# Patient Record
Sex: Female | Born: 1945 | Race: White | Hispanic: No | Smoking: Current every day smoker
Health system: Southern US, Community
[De-identification: ages and names within clinical notes are randomized; demographics above are authoritative.]

## PROBLEM LIST (undated history)

## (undated) DIAGNOSIS — R0600 Dyspnea, unspecified: Secondary | ICD-10-CM

## (undated) DIAGNOSIS — Z803 Family history of malignant neoplasm of breast: Secondary | ICD-10-CM

## (undated) DIAGNOSIS — K219 Gastro-esophageal reflux disease without esophagitis: Secondary | ICD-10-CM

## (undated) DIAGNOSIS — F439 Reaction to severe stress, unspecified: Secondary | ICD-10-CM

## (undated) DIAGNOSIS — R001 Bradycardia, unspecified: Secondary | ICD-10-CM

## (undated) DIAGNOSIS — G629 Polyneuropathy, unspecified: Secondary | ICD-10-CM

## (undated) DIAGNOSIS — M1990 Unspecified osteoarthritis, unspecified site: Secondary | ICD-10-CM

## (undated) DIAGNOSIS — I480 Paroxysmal atrial fibrillation: Secondary | ICD-10-CM

## (undated) DIAGNOSIS — R519 Headache, unspecified: Secondary | ICD-10-CM

## (undated) DIAGNOSIS — I10 Essential (primary) hypertension: Secondary | ICD-10-CM

## (undated) DIAGNOSIS — Z8041 Family history of malignant neoplasm of ovary: Secondary | ICD-10-CM

## (undated) DIAGNOSIS — F329 Major depressive disorder, single episode, unspecified: Secondary | ICD-10-CM

## (undated) DIAGNOSIS — F32A Depression, unspecified: Secondary | ICD-10-CM

## (undated) DIAGNOSIS — E119 Type 2 diabetes mellitus without complications: Secondary | ICD-10-CM

## (undated) DIAGNOSIS — Z8489 Family history of other specified conditions: Secondary | ICD-10-CM

## (undated) DIAGNOSIS — R51 Headache: Secondary | ICD-10-CM

## (undated) HISTORY — DX: Reaction to severe stress, unspecified: F43.9

## (undated) HISTORY — DX: Bradycardia, unspecified: R00.1

## (undated) HISTORY — DX: Essential (primary) hypertension: I10

## (undated) HISTORY — DX: Type 2 diabetes mellitus without complications: E11.9

## (undated) HISTORY — DX: Paroxysmal atrial fibrillation: I48.0

## (undated) HISTORY — DX: Family history of malignant neoplasm of ovary: Z80.41

## (undated) HISTORY — DX: Family history of malignant neoplasm of breast: Z80.3

## (undated) HISTORY — PX: CARDIAC CATHETERIZATION: SHX172

## (undated) HISTORY — PX: KNEE ARTHROSCOPY: SHX127

## (undated) HISTORY — PX: COLONOSCOPY: SHX174

## (undated) HISTORY — PX: CARPAL TUNNEL RELEASE: SHX101

---

## 1978-09-15 HISTORY — PX: APPENDECTOMY: SHX54

## 1978-09-15 HISTORY — PX: ABDOMINAL HYSTERECTOMY: SHX81

## 1998-06-05 ENCOUNTER — Encounter: Payer: Self-pay | Admitting: Cardiology

## 1998-06-05 ENCOUNTER — Emergency Department (HOSPITAL_COMMUNITY): Admission: EM | Admit: 1998-06-05 | Discharge: 1998-06-05 | Payer: Self-pay | Admitting: Emergency Medicine

## 2000-05-05 ENCOUNTER — Other Ambulatory Visit: Admission: RE | Admit: 2000-05-05 | Discharge: 2000-05-05 | Payer: Self-pay | Admitting: Obstetrics and Gynecology

## 2000-05-05 ENCOUNTER — Encounter: Payer: Self-pay | Admitting: Obstetrics and Gynecology

## 2000-05-05 ENCOUNTER — Encounter: Admission: RE | Admit: 2000-05-05 | Discharge: 2000-05-05 | Payer: Self-pay | Admitting: Obstetrics and Gynecology

## 2000-05-08 ENCOUNTER — Encounter: Admission: RE | Admit: 2000-05-08 | Discharge: 2000-05-08 | Payer: Self-pay | Admitting: Obstetrics and Gynecology

## 2000-05-08 ENCOUNTER — Encounter: Payer: Self-pay | Admitting: Obstetrics and Gynecology

## 2000-06-14 ENCOUNTER — Emergency Department (HOSPITAL_COMMUNITY): Admission: EM | Admit: 2000-06-14 | Discharge: 2000-06-14 | Payer: Self-pay | Admitting: Emergency Medicine

## 2000-12-04 ENCOUNTER — Ambulatory Visit (HOSPITAL_COMMUNITY): Admission: RE | Admit: 2000-12-04 | Discharge: 2000-12-04 | Payer: Self-pay | Admitting: Orthopaedic Surgery

## 2000-12-18 ENCOUNTER — Encounter: Admission: RE | Admit: 2000-12-18 | Discharge: 2000-12-18 | Payer: Self-pay | Admitting: Orthopaedic Surgery

## 2001-01-01 ENCOUNTER — Encounter: Admission: RE | Admit: 2001-01-01 | Discharge: 2001-01-01 | Payer: Self-pay | Admitting: Orthopaedic Surgery

## 2001-04-23 ENCOUNTER — Ambulatory Visit (HOSPITAL_COMMUNITY): Admission: RE | Admit: 2001-04-23 | Discharge: 2001-04-23 | Payer: Self-pay | Admitting: Cardiology

## 2001-05-25 ENCOUNTER — Encounter: Payer: Self-pay | Admitting: Obstetrics and Gynecology

## 2001-05-25 ENCOUNTER — Other Ambulatory Visit: Admission: RE | Admit: 2001-05-25 | Discharge: 2001-05-25 | Payer: Self-pay | Admitting: Obstetrics and Gynecology

## 2001-05-25 ENCOUNTER — Encounter: Admission: RE | Admit: 2001-05-25 | Discharge: 2001-05-25 | Payer: Self-pay | Admitting: Obstetrics and Gynecology

## 2001-11-09 ENCOUNTER — Emergency Department (HOSPITAL_COMMUNITY): Admission: EM | Admit: 2001-11-09 | Discharge: 2001-11-09 | Payer: Self-pay | Admitting: Emergency Medicine

## 2001-11-09 ENCOUNTER — Encounter: Payer: Self-pay | Admitting: Emergency Medicine

## 2002-05-26 ENCOUNTER — Encounter: Admission: RE | Admit: 2002-05-26 | Discharge: 2002-05-26 | Payer: Self-pay | Admitting: Obstetrics and Gynecology

## 2002-05-26 ENCOUNTER — Encounter: Payer: Self-pay | Admitting: Obstetrics and Gynecology

## 2002-05-30 ENCOUNTER — Other Ambulatory Visit: Admission: RE | Admit: 2002-05-30 | Discharge: 2002-05-30 | Payer: Self-pay | Admitting: Obstetrics and Gynecology

## 2002-09-15 HISTORY — PX: BACK SURGERY: SHX140

## 2002-10-12 ENCOUNTER — Ambulatory Visit (HOSPITAL_COMMUNITY): Admission: RE | Admit: 2002-10-12 | Discharge: 2002-10-13 | Payer: Self-pay | Admitting: Orthopaedic Surgery

## 2002-12-04 ENCOUNTER — Ambulatory Visit (HOSPITAL_COMMUNITY): Admission: RE | Admit: 2002-12-04 | Discharge: 2002-12-04 | Payer: Self-pay | Admitting: Orthopaedic Surgery

## 2005-09-15 HISTORY — PX: JOINT REPLACEMENT: SHX530

## 2006-04-15 ENCOUNTER — Inpatient Hospital Stay (HOSPITAL_COMMUNITY): Admission: RE | Admit: 2006-04-15 | Discharge: 2006-04-20 | Payer: Self-pay | Admitting: Orthopaedic Surgery

## 2006-04-16 ENCOUNTER — Ambulatory Visit: Payer: Self-pay | Admitting: Physical Medicine & Rehabilitation

## 2006-07-06 ENCOUNTER — Inpatient Hospital Stay (HOSPITAL_COMMUNITY): Admission: RE | Admit: 2006-07-06 | Discharge: 2006-07-11 | Payer: Self-pay | Admitting: Orthopaedic Surgery

## 2006-07-10 ENCOUNTER — Encounter: Payer: Self-pay | Admitting: Vascular Surgery

## 2006-07-23 ENCOUNTER — Encounter: Payer: Self-pay | Admitting: Vascular Surgery

## 2006-07-23 ENCOUNTER — Ambulatory Visit (HOSPITAL_COMMUNITY): Admission: RE | Admit: 2006-07-23 | Discharge: 2006-07-23 | Payer: Self-pay | Admitting: Orthopaedic Surgery

## 2007-01-27 ENCOUNTER — Encounter: Admission: RE | Admit: 2007-01-27 | Discharge: 2007-01-27 | Payer: Self-pay | Admitting: Obstetrics and Gynecology

## 2007-04-02 ENCOUNTER — Ambulatory Visit: Payer: Self-pay | Admitting: Gastroenterology

## 2007-04-12 ENCOUNTER — Ambulatory Visit: Payer: Self-pay | Admitting: Gastroenterology

## 2007-09-17 ENCOUNTER — Emergency Department (HOSPITAL_COMMUNITY): Admission: EM | Admit: 2007-09-17 | Discharge: 2007-09-17 | Payer: Self-pay | Admitting: Emergency Medicine

## 2010-06-03 ENCOUNTER — Ambulatory Visit: Payer: Self-pay | Admitting: Cardiology

## 2010-06-24 ENCOUNTER — Ambulatory Visit: Payer: Self-pay | Admitting: Cardiology

## 2010-07-03 ENCOUNTER — Ambulatory Visit: Payer: Self-pay | Admitting: Cardiovascular Disease

## 2011-01-01 ENCOUNTER — Encounter: Payer: Self-pay | Admitting: Cardiology

## 2011-01-03 ENCOUNTER — Ambulatory Visit: Payer: Self-pay | Admitting: Cardiology

## 2011-01-31 NOTE — Discharge Summary (Signed)
NAMECATHERYNE, Melanie [name]   MEDICAL RECORD NO.:  [id number]          PATIENT TYPE:  INP   LOCATION:  5019                         FACILITY:  [facility]   PHYSICIAN:  Mark C. Ophelia Charter, M.D.    DATE OF BIRTH:  August 06, 1946   DATE OF ADMISSION:  07/06/2006  DATE OF DISCHARGE:  07/11/2006                               DISCHARGE SUMMARY   FINAL DIAGNOSIS:  Right knee osteoarthritis.   PROCEDURE:  Right total knee arthroplasty.   ADDITIONAL DIAGNOSIS:  Hypertension, coronary artery disease, tobacco  use, hypercholesterolemia, history of atrial fibrillation.   This 65 year old female with progressive bone-on-bone changes in the  right knee, doing well past after her left total knee arthroplasty, July  of 2007.  She has failed anti-inflammatories and injection, and has bone-  on-bone changes.  She underwent computer-assisted right cemented total  knee arthroplasty on July 06, 2006.  The PFC Sigma Cruciate  subsetting component was used with a 10-mm insert.  The patient  tolerated the procedure well postop.  Seen by pharmacy for Coumadin, DVT  prophylaxis.  PT/OT consult.  Weightbearing as tolerated.   Hemoglobin was 7.2 postop.  She complains of significant pain.  She was  ambulatory postop day two.  Hemoglobin remained stable in the 10 to 10.2  range.  INR initially increased to __________  then dropped to 1.7 to  1.8.  She had no thigh pain.  Doppler test was checked due to the  patient's thigh pain.  She was concerned she might have DVT.  Doppler  test was negative.  She was controlled with p.o. pain medication.  X-ray  showed excellent position of the prosthesis.   PLAN:  She was discharged postop home with physical therapy; office  followup 2 weeks postop.  Prescription for Tylox given.      Mark C. Ophelia Charter, M.D.  Electronically Signed     MCY/MEDQ  D:  08/19/2006  T:  08/20/2006  Job:  [id number]

## 2011-01-31 NOTE — Op Note (Signed)
Melanie [name], Melanie [name]   MEDICAL RECORD NO.:  [id number]          PATIENT TYPE:  INP   LOCATION:  2899                         FACILITY:  [facility]   PHYSICIAN:  Mark C. Ophelia Charter, M.D.    DATE OF BIRTH:  Sep 24, 1945   DATE OF PROCEDURE:  04/15/2006  DATE OF DISCHARGE:                                 OPERATIVE REPORT   SURGEON:  Mark C. Ophelia Charter, M.D.   PREOPERATIVE DIAGNOSIS:  Left knee osteoarthritis.   POSTOPERATIVE DIAGNOSIS:  Left knee osteoarthritis.   PROCEDURE:  Left cemented total knee arthroplasty.   ANESTHESIA:  GOT plus preoperative femoral nerve block and postoperative 20  cc Marcaine.   DESCRIPTION OF PROCEDURE:  After standard prep and draping and preoperative  Ancef prophylaxis, 2 g, a proximal thigh tourniquet, lateral post and heel  bump were used.  Incision was made after standard Betadine, Vi-Drape, split  sheets and stockinette and Coban were applied.  Midline incision was made.  Resection of menisci, ACL and PCL, spurs off the femur.  Pins, 5 mm, were  placed in the femur in the incision and 2 in the tibia.  The femur was cut  first, based on 10-mm resection.  A computer was used for input of femoral  hip rotation, tibial model, femoral model.  After the femur was cut, sizing  showed a #3.  Chamfer cuts, notch cuts were made.  Next, the tibia was cut.  There was tricompartmental degenerative changes, bone-on-bone changes and  chronic synovitis.  In the suprapatellar pouch, thick synovium was excised.  There were no changes that would suggest chronic infection, however.  Spurs  were removed off the femur.  The #3 size was appropriate for the tibia.  Sequential keel preparation was used in the tibia, followed by trials with a  10-mm spacer with full extension.  Excellent flexion-extension balance and  perfect alignment on use of the computer.  After removal of trials,  pulsatile lavage, computer pins were removed.  Pulsatile lavage.   Cement was  mixed.  The tibia was cemented first, followed by the femur, placement of  the tibial 10-mm tray, and the patella had been prepared with the  oscillating saw from facet to facet at the beginning of the case, remaining  10 mm of bone, and the 35-mm all-poly dome placed.  The patient tolerated  the procedure well.  After the cement was hard, pressure was held and all  excessive cement had been removed.  The tourniquet was deflated.  Pulsatile  lavage was used again.  Capsule closed with #1 Tycron figure-of-eight,  interrupted, 2-0 Vicryl in the subcutaneous tissue, skin staples.  Instrument count/needle count was correct.      Mark C. Ophelia Charter, M.D.  Electronically Signed     MCY/MEDQ  D:  04/15/2006  T:  04/15/2006  Job:  [id number]

## 2011-01-31 NOTE — Op Note (Signed)
Melanie Melanie [name], Melanie [name]   MEDICAL RECORD NO.:  [id number]          PATIENT TYPE:  INP   LOCATION:  5019                         FACILITY:  [facility]   PHYSICIAN:  Mark C. Ophelia Charter, M.D.    DATE OF BIRTH:  11/20/1945   DATE OF PROCEDURE:  07/06/2006  DATE OF DISCHARGE:                                 OPERATIVE REPORT   PREOPERATIVE DIAGNOSIS:  Right knee osteoarthritis.   POSTOPERATIVE DIAGNOSIS:  Right knee osteoarthritis.   PROCEDURE:  Right cemented total knee arthroplasty, computer assisted.   SURGEON:  Mark C. Ophelia Charter, M.D.   ASSISTANT:  Wende Neighbors, P.A.-C.   ANESTHESIA:  GOT, plus Marcaine local.   COMPONENTS:  [employer] rotating platform PFC Sigma, #3 femur,  #3 tibia, 10-mm spacer, 35-mm patella.   ESTIMATED BLOOD LOSS:  Minimal.   TOURNIQUET TIME:  1 hour 15 minutes.   DESCRIPTION OF PROCEDURE:  After induction of general anesthesia, proximal  pneumatic tourniquet, Foley placed and preoperative Ancef prophylaxis.  Standard prepping and draping were performed.  Impervious stockinette and  Coban were used.  Arthroscopic sheets and drapes were applied.  Sterile skin  marker and Betadine and Vi-Drape were applied.  Incision was made at the  midline and medial parapatellar skin incision was made, splitting the quad  tendon.  Patella was cut initially from facet to facet with an oscillating  saw.  Standard resection of the menisci was performed, and the medial  femoral condyle was polished flattened.  The ACL and PCL were resected.  The  patella was flipped over, cut from facet to facet.  The distal femur was cut  first, and distal femoral pins and tibial pins were placed.  Bicortical pins  were used.  Distal femur initially cut, removing 9 mm.  As I was progressing  for the anterior cut, the saw bumped against the femoral pins, and  apparently, the array had been tightened down with the pliers.  It was in  between the teeth  and could not be tightened down securely.  Progression to  the tibia was performed.  The tibia was pinned and cut, and from this point,  the femoral array was placed back in the distal femoral jig; and, then,  using this, the femoral array attached to the pins was adjusted until  everything was straightened and tightened down securely.  Cutting continued.  The #3 was appropriate, based upon sizing with the computer.  The posterior  capsule was tight, and there were chronic synovitis changes with red,  erythematous synovium and thickening anteriorly, as well as posteriorly,  which looked like chronic synovitis, or possibly a previous fall with a  contusion anteriorly.  Scar tissue was resected.  There were only some areas  of grade 4 chondromalacia in the lateral compartment.  The box cut was made  using the computer with the epicondylar axis.  With trials in place, there  was still some tightness of the posterior capsule; it did not come quite  completely straight, and additional posterior capsule resected and stripping  posteriorly with 3/4 curved osteotome.  All the PCL was resected.  Keel cuts  were made in the tibia.  Pulsatile lavage.  The cement was mixed, and all 3  components were cemented in, tibia first, followed by femur, the 10-mm  spacer and patella.  There were full extension, excellent alignment.  Computer pins had been removed, and after pulsatile lavage, when the cement  was hard, the tourniquet was deflated.  Deep capsule was closed with Tycron,  2-0 Vicryl subcutaneous tissue, superficial retinaculum, subcuticular skin  closure.  Tincture of benzoin and Steri-Strips.  Postop dressing.  Instrument count, needle count were correct.  Knee immobilizer was applied,  and a postoperative dressing.      Mark C. Ophelia Charter, M.D.  Electronically Signed     MCY/MEDQ  D:  07/06/2006  T:  07/06/2006  Job:  [id number]

## 2011-01-31 NOTE — H&P (Signed)
[place]  Patient:    Melanie [name], Melanie [name]                      MRN: [id number] Adm. Date:  04/23/01 Attending:  Colleen Can. Deborah Chalk, M.D. Dictator:   Jennet Maduro. Earl Gala, R.N., A.N.P.                         History and Physical  CHIEF COMPLAINT:  Intermittent episodes of tachycardia.  HISTORY OF PRESENT ILLNESS:  Melanie [name] is a very pleasant 65 year old white female.  She has multiple medical problems.  She was seen at her regular six-month follow-up appointment towards the latter part of July.  At that point she noted that she had had three episodes of tachycardia but really attributed a lot of that to changes in her life as well as to her menstrual cycle.  She has ongoing tobacco abuse and is under a considerable amount of stress at home in caring for a husband who has a chronic pain syndrome due to cervical spine disease.  In the past she has had atrial fibrillation and has ongoing problems with hypertension and hypercholesterolemia.  We proceeded on with a stress Cardiolite study in which she demonstrated rather poor exercise tolerance.  There were marked EKG changes to suggest ischemia.  The Cardiolite images demonstrated lateral ischemia and she is now referred on for elective coronary angiography.  PAST MEDICAL HISTORY: 1. Hypercholesterolemia. 2. Hypertension. 3. Ongoing tobacco abuse. 4. Status post hysterectomy. 5. History of atrial fibrillation, paroxysmal. 6. Situational stress. 7. Obesity.  ALLERGIES:  None known.  CURRENT MEDICATIONS: 1. Ziac 5 mg a day. 2. Cardizem CD 120 mg a day. 3. Baby aspirin daily. 4. Vitamin E twice a day. 5. Tricor daily. 6. Multivitamin daily. 7. B6 daily.  FAMILY HISTORY:  Positive for coronary disease.  Her father died of a heart attack at the age of 20.  SOCIAL HISTORY:  She is married with two sons.  She has been smoking for the past 20-25 years.  There is no alcohol use.  She cares for  her disabled husband.  REVIEW OF SYSTEMS:  Otherwise as stated above.  She has had no frank chest pain, no shortness of breath.  No recent fever, flu cough-like syndrome.  No constipation, no diarrhea.  No edema.  PHYSICAL EXAMINATION:  GENERAL:  She is a pleasant, somewhat anxious female in no acute distress.  VITAL SIGNS:  Weight is 192 pounds.  Blood pressure is 150/80 sitting, 150/90 standing.  Heart rate is 64, respirations 18.  She is afebrile.  SKIN:  Warm and dry.  Color unremarkable.  NECK:  Supple.  LUNGS:  Clear.  HEART:  Shows a regular rhythm.  ABDOMEN:  Soft, positive bowel sounds, nontender.  EXTREMITIES:  Without edema.  NEUROLOGIC:  Intact.  LABORATORY DATA:  Currently pending.  OVERALL IMPRESSION:  Abnormal Cardiolite study suggesting lateral ischemia in the setting of multiple cardiovascular risk factors (ongoing tobacco abuse, hypercholesterolemia, hypertension, and positive family history).  PLAN:  Will proceed on with elective coronary angiography.  The risks, procedure, and benefits have been explained and she is willing to proceed. DD:  04/21/01 TD:  04/21/01 Job: [id number]

## 2011-01-31 NOTE — Op Note (Signed)
Melanie [name], Melanie [name]                       ACCOUNT NO.:  [id number]   MEDICAL RECORD NO.:  [id number]                   PATIENT TYPE:  OIB   LOCATION:  NA                                   FACILITY:  [facility]   PHYSICIAN:  Mark C. Ophelia Charter, M.D.                 DATE OF BIRTH:  [birth year]   DATE OF PROCEDURE:  DATE OF DISCHARGE:                                 OPERATIVE REPORT   PREOPERATIVE DIAGNOSIS:  Right L4-5 herniated nucleus pulposus.   POSTOPERATIVE DIAGNOSIS:  Right L4-5 herniated nucleus pulposus.   PROCEDURE:  Right L4-5 laminotomy and L4-5 microdiskectomy.   SURGEON:  Dr. Annell Greening.   ASSISTANT:  Dr. Odette Fraction.   ANESTHESIA:  GOT.   ESTIMATED BLOOD LOSS:  Less than 100 cc.   DESCRIPTION OF PROCEDURE:  After induction of general anesthesia, the  patient was placed on the Andrews frame with preoperative Ancef prophylaxis  and careful padding and positioning.  The back was prepped with Duraprep,  the area was squared with towels, Betadine applied and laminectomy sheet.  Needle localization with the needle just at the top portion of the disk at  L4-5 was obtained with cross table lateral plane radiograph.  Incision was  made in the midline.  Subperiosteal dissection on the right side was  performed and a self retaining retractor was placed.  The inferior aspect of  the lamina at L4 was identified, a laminotomy was performed with 3 mm  Kerrison rongeur and the hypertrophic thick ligament was removed, exposing  the dura.  Foraminotomy was performed, nerve root was evaluated and  visualized, and bone was removed after the level out to the level of the  pedicle.  Nerve root was gently retracted towards the midline with the  retractor, there was a bulging disk present on the far right side.  Annulus  was incised with a 15 scalpel blade and passes were made with the  micropituitaries, Epsteins and straight and down biting pituitary.  Epstein  was used to slip underneath  the ligament, out to the far lateral side and  deliver the disk material to the center portion of the disk where it was  grasped with straight pituitary and removed.  Straight pituitary was used  from the opposite side by Dr. Ollen Bowl to remove some additional disk  fragments out to the right side.  Hockey stick was used out far on the right  side, the disk was fat, there were no areas of compression and 180 degree  sweep anterior to the dura was performed with no areas of central  compression.  Foramina was re-inspected.  The axilla of the nerve root and  foramina was slightly enlarged, removing the small remaining pieces of bone.  After irrigation with saline solution, deep fascia was closed with 0 Vicryl,  2-0 Vicryl in subcutaneous tissue, skin staple closure, Marcaine  infiltration into the skin and postoperative  dressing.  The patient was  transferred to the supine position, extubated and then transferred to the  recovery room in stable condition.  Needle count was correct.                                               Mark C. Ophelia Charter, M.D.    MCY/MEDQ  D:  10/12/2002  T:  10/12/2002  Job:  [id number]

## 2011-01-31 NOTE — Discharge Summary (Signed)
NAMECATHERINE, Melanie [name]   MEDICAL RECORD NO.:  [id number]          PATIENT TYPE:  INP   LOCATION:  5021                         FACILITY:  [facility]   PHYSICIAN:  Mark C. Ophelia Charter, M.D.    DATE OF BIRTH:  1945/12/07   DATE OF ADMISSION:  04/15/2006  DATE OF DISCHARGE:  04/20/2006                                 DISCHARGE SUMMARY   FINAL DIAGNOSES:  Left osteoarthritis.   ADDITIONAL DIAGNOSES:  1. Tobacco use.  2. Type 2 diabetes.  3. Hypertension.   PROCEDURE:  Left cemented total knee arthroplasty, computer assisted.   This 65 year old female has been followed for many years with progressive  left knee pain, bone on bone, has decreased activities of daily living.  She  has been on antiinflammatories and cortisone injections without relief.  X-  rays demonstrate bone on bone changes, 10 degree flexion contracture.  She  is able to flex from 10 to 110 degrees and has __________compartment with  degenerative changes.   ADMISSION LABS:  Showed a normal CBC.  PT/PTT were normal.  Chemistry panel  was initially normal with a glucose of 85 and urinalysis was clear.   The patient was taken to the operating room after informed consent,  underwent cemented total knee arthroplasty with a PFC, diffuse sigma,  cruciate substituting rotating platform.  Thirty-five patella, 3 tibia, 3  femur were used with a 10 mm spacer.  Postoperatively, she had some elevated  glucose readings of 147, 138 and 172 for the 3 days after surgery.  She made  slow progress with physical therapy.  She was used morphine repetitively to  the point where she was falling asleep.  Hemoglobin was stable at 10.1.  She  was placed on a sliding scale insulin.  HepLock was removed on April 17, 2006.  Foley was removed.  She was started on Lantus 10 mg a day.  Diabetic  treatment protocol recommendations were changing to a CHS 60 gram modified  diet was followed.  She had problems with anxiety  and depression.  Zoloft  was started.  Referral was made to her internist for evaluation of her  hyperglycemia.  This was set up with Dr. Cena Benton, [facility], since she did not have a primary care physician.  Discharge  prescriptions were given for Zoloft and Tylox.  INR was 1.7.  She will be on  home Coumadin for 4 weeks.  Asked to followup in my office one week after  discharge.   CONDITION ON DISCHARGE:  Improved.   FINAL DIAGNOSES:  1. Knee osteoarthritis.  2. New onset type 2 diabetes.      Mark C. Ophelia Charter, M.D.  Electronically Signed     MCY/MEDQ  D:  06/16/2006  T:  06/16/2006  Job:  [id number]

## 2011-01-31 NOTE — Cardiovascular Report (Signed)
[place]  Patient:    Melanie [name], Melanie [name]                      MRN: [id number] Proc. Date: 04/22/01 Attending:  Colleen Can. Deborah Chalk, M.D.                        Cardiac Catheterization  HISTORY:  The patient is a 65 year old female, with a history of paroxysmal atrial fibrillation and cigarette abuse, hypertension, and an abnormal stress Cardiolite study with poor exercise tolerance.  She has had episodes of tachycardia and is referred for evaluation of abnormal stress Cardiolite study.  PROCEDURE:  Left heart catheterization with selective coronary angiography, left ventricular angiography.  TYPE AND SITE OF ENTRY:  Percutaneous right femoral artery with Perclose.  CATHETERS:  A 6 French 4 curved Judkins right and left coronary catheters, 6 French pigtail ventriculographic catheter.  CONTRAST MATERIAL:  Omnipaque.  MEDICATIONS GIVEN PRIOR TO THE PROCEDURE:  Valium 10 mg p.o.  MEDICATIONS GIVEN DURING THE PROCEDURE:  Versed 3 mg IV, fentanyl 25 mcg IV.  COMMENTS:  The patient tolerated the procedure well.  HEMODYNAMIC DATA:  The aortic pressure was 137/77,  LV was 136/11-16.  There was no aortic valve gradient noted on pullback.  LEFT VENTRICULOGRAPHY:  Left ventricular angiogram was performed in the RAO position.  Overall cardiac size and silhouette were normal.  Left ventricular function is normal.  CORONARY ARTERY: 1. Left main coronary artery:  Left main coronary artery is normal. 2. Left anterior descending:  The left anterior descending has mild    irregularities in the midportion.  It crosses the apex.  There are two    relatively large diagonal vessels that are normal. 3. Intermediate coronary:  There is intermediate coronary without significant    obstructive disease. 4. Left circumflex:  The left circumflex is normal. 5. Right coronary artery:  The right coronary artery is a large vessel.  There    is mild irregularities in  the midportion.  Overall, it is normal.  The flow    is excellent.  OVERALL IMPRESSION: 1. Normal left ventricular function. 2. Essentially normal coronary arteries with minimal atherosclerosis in the    left anterior descending and right coronary artery.  DISCUSSION:  It is felt that the patient has essentially normal coronary arteries.  Findings on the Cardiolite study are probably related to artifact. She has normal left ventricular function.  We will direct our attention cardiovascular risk factors. DD:  04/23/01 TD:  04/24/01 Job: [id number]

## 2011-02-03 ENCOUNTER — Encounter: Payer: Self-pay | Admitting: Cardiology

## 2011-02-03 ENCOUNTER — Ambulatory Visit (INDEPENDENT_AMBULATORY_CARE_PROVIDER_SITE_OTHER): Payer: Medicare Other | Admitting: Cardiology

## 2011-02-03 DIAGNOSIS — I480 Paroxysmal atrial fibrillation: Secondary | ICD-10-CM | POA: Insufficient documentation

## 2011-02-03 DIAGNOSIS — R001 Bradycardia, unspecified: Secondary | ICD-10-CM | POA: Insufficient documentation

## 2011-02-03 DIAGNOSIS — E119 Type 2 diabetes mellitus without complications: Secondary | ICD-10-CM | POA: Insufficient documentation

## 2011-02-03 DIAGNOSIS — I10 Essential (primary) hypertension: Secondary | ICD-10-CM | POA: Insufficient documentation

## 2011-02-03 DIAGNOSIS — I4891 Unspecified atrial fibrillation: Secondary | ICD-10-CM

## 2011-02-03 NOTE — Assessment & Plan Note (Signed)
Blood pressure in general has been satisfactory. Slightly elevated today. She will see Dr. Clelia Croft later this week.  I will arrange followup with Dr. Elease Hashimoto in approximate 8 months.

## 2011-02-03 NOTE — Progress Notes (Signed)
Subjective:    Melanie [name] is seen today for followup visit. She has history of hypertensive heart and bradycardia which has been improved with reduction of Cardizem. In general, she's been doing well and has adequate blood pressure control. She continues to smoke and is under stress in caring for her invalid husband. She has not had recurrence of atrial fibrillation. She did have borderline glucose levels her hemoglobin A1c is less than 6. Because of the infrequency of her history of atrial fibrillation, we have not placed her on chronic anticoagulation. I encouraged her to walk and to stop smoking.  Current Outpatient Prescriptions  Medication Sig Dispense Refill  . aspirin 81 MG tablet Take 81 mg by mouth daily.        . bisoprolol-hydrochlorothiazide (ZIAC) 5-6.25 MG per tablet Take 1 tablet by mouth 2 (two) times daily.        Marland Kitchen diltiazem (CARDIZEM CD) 240 MG 24 hr capsule Take 240 mg by mouth daily.        Marland Kitchen lisinopril (PRINIVIL,ZESTRIL) 20 MG tablet Take 20 mg by mouth daily.        Marland Kitchen loratadine (CLARITIN) 10 MG tablet Take 10 mg by mouth daily.        . Misc Natural Products (OSTEO BI-FLEX JOINT SHIELD PO) Take by mouth 2 (two) times daily.        . sertraline (ZOLOFT) 50 MG tablet Take 50 mg by mouth daily.        Marland Kitchen DISCONTD: calcium carbonate (TUMS - DOSED IN MG ELEMENTAL CALCIUM) 500 MG chewable tablet Chew 1 tablet by mouth as needed.        Marland Kitchen DISCONTD: fish oil-omega-3 fatty acids 1000 MG capsule Take 2 g by mouth daily.          Not on File  Patient Active Problem List  Diagnoses  . HTN (hypertension)  . Bradycardia  . DM (diabetes mellitus)  . PAF (paroxysmal atrial fibrillation)    History  Smoking status  . Current Everyday Smoker -- 0.8 packs/day  Smokeless tobacco  . Not on file    History  Alcohol Use No    Family History  Problem Relation Age of Onset  . Heart attack      Review of Systems:   The patient denies any heat or cold intolerance.  No  weight gain or weight loss.  The patient denies headaches or blurry vision.  There is no cough or sputum production.  The patient denies dizziness.  There is no hematuria or hematochezia.  The patient denies any muscle aches or arthritis.  The patient denies any rash.  The patient denies frequent falling or instability.  There is no history of depression or anxiety.  All other systems were reviewed and are negative.   Physical Exam:   Blood pressure 146/80. Heart rate 56. Weight is 185.The head is normocephalic and atraumatic.  Pupils are equally round and reactive to light.  Sclerae nonicteric.  Conjunctiva is clear.  Oropharynx is unremarkable.  There's adequate oral airway.  Neck is supple there are no masses.  Thyroid is not enlarged.  There is no lymphadenopathy.  Lungs are clear.  Chest is symmetric.  Heart shows a regular rate and rhythm.  S1 and S2 are normal.  There is no murmur click or gallop.  Abdomen is soft normal bowel sounds.  There is no organomegaly.  Genital and rectal deferred.  Extremities are without edema.  Peripheral pulses are adequate.  Neurologically intact.  Full range of motion.  The patient is not depressed.  Skin is warm and dry.  Assessment / Plan:

## 2011-02-03 NOTE — Assessment & Plan Note (Signed)
She has not had recurrence of atrial fibrillation with reduction of Cardizem because of symptomatic bradycardia. I encouraged her to continue to try lose weight and stop smoking.

## 2011-07-02 ENCOUNTER — Other Ambulatory Visit: Payer: Self-pay | Admitting: Cardiovascular Disease

## 2011-07-02 MED ORDER — LISINOPRIL 20 MG PO TABS: 20.0000 mg | ORAL_TABLET | Freq: Every day | ORAL | Status: DC

## 2011-07-03 ENCOUNTER — Other Ambulatory Visit: Payer: Self-pay

## 2011-07-03 MED ORDER — LISINOPRIL 20 MG PO TABS: 20.0000 mg | ORAL_TABLET | Freq: Every day | ORAL | Status: DC

## 2011-07-03 NOTE — Telephone Encounter (Signed)
.   Requested Prescriptions   Signed Prescriptions Disp Refills  . lisinopril (PRINIVIL,ZESTRIL) 20 MG tablet 30 tablet 5    Sig: Take 1 tablet (20 mg total) by mouth daily.    Authorizing Provider: Rosalio Macadamia    Ordering User: Vernie Murders to [facility].

## 2011-07-04 ENCOUNTER — Other Ambulatory Visit: Payer: Self-pay | Admitting: *Deleted

## 2011-07-04 MED ORDER — BISOPROLOL-HYDROCHLOROTHIAZIDE 5-6.25 MG PO TABS: 1.0000 | ORAL_TABLET | Freq: Two times a day (BID) | ORAL | Status: DC

## 2011-08-15 ENCOUNTER — Other Ambulatory Visit: Payer: Self-pay

## 2011-08-15 MED ORDER — DILTIAZEM HCL ER COATED BEADS 240 MG PO CP24: 240.0000 mg | ORAL_CAPSULE | Freq: Every day | ORAL | Status: DC

## 2011-09-24 ENCOUNTER — Encounter: Payer: Self-pay | Admitting: *Deleted

## 2011-09-24 ENCOUNTER — Telehealth: Payer: Self-pay | Admitting: *Deleted

## 2011-09-24 NOTE — Telephone Encounter (Signed)
Sent/ faxed clearance for dental surg, see letter

## 2011-10-06 ENCOUNTER — Ambulatory Visit (INDEPENDENT_AMBULATORY_CARE_PROVIDER_SITE_OTHER): Payer: Medicare Other | Admitting: Cardiovascular Disease

## 2011-10-06 ENCOUNTER — Encounter: Payer: Self-pay | Admitting: Cardiovascular Disease

## 2011-10-06 VITALS — BP 125/66 | HR 58 | Ht 67.0 in | Wt 202.8 lb

## 2011-10-06 DIAGNOSIS — E7800 Pure hypercholesterolemia, unspecified: Secondary | ICD-10-CM

## 2011-10-06 DIAGNOSIS — I10 Essential (primary) hypertension: Secondary | ICD-10-CM

## 2011-10-06 NOTE — Assessment & Plan Note (Addendum)
Her blood pressure is well controlled.  Continue with current meds.  She stopped smoking several months ago which should help her BP control

## 2011-10-06 NOTE — Patient Instructions (Signed)
The current medical regimen is effective;  continue present plan and medications.  Follow up in 6 months with Dr Elease Hashimoto.  You will receive a letter in the mail 2 months before you are due.  Please call us when you receive this letter to schedule your follow up appointment.  Your physician recommends that you return for lab work in: 6 months (fastig lipid, hepatic and BMP)

## 2011-10-06 NOTE — Progress Notes (Signed)
    Melanie [name] Date of Birth  1945/10/22 [facility] Office  [address]    [address]   [address] [place], [place]  [address]    [place], [place]  [address] [phone]  Fax  [phone]  [phone]  Fax [phone]   History of Present Illness:  1. HTN 2. Bradycardia 3. Hyperlipidemia 4. Depression 5. History of chest pain-cardiac catheterization in 2002 revealed normal coronary arteries and normal left ventricle systolic function 6. Former smoker-quit in November, 2012  Melanie [name] is seen today for followup visit. She has history of hypertensive heart and bradycardia which has been improved with reduction of Cardizem. In general, she's been doing well and has adequate blood pressure control.   She quit smoking several months ago. She feels fairly well but unfortunately she's gained a little bit of weight. She still is under tremendous stress taking care of her invalid husband at home. Unable to get a lot of regular exercise.   Current Outpatient Prescriptions on File Prior to Visit  Medication Sig Dispense Refill  . aspirin 81 MG tablet Take 81 mg by mouth daily.        . bisoprolol-hydrochlorothiazide (ZIAC) 5-6.25 MG per tablet Take 1 tablet by mouth 2 (two) times daily.  60 tablet  4  . diltiazem (CARDIZEM CD) 240 MG 24 hr capsule Take 1 capsule (240 mg total) by mouth daily.  30 capsule  1  . lisinopril (PRINIVIL,ZESTRIL) 20 MG tablet Take 1 tablet (20 mg total) by mouth daily.  30 tablet  5  . loratadine (CLARITIN) 10 MG tablet Take 10 mg by mouth daily.        . Misc Natural Products (OSTEO BI-FLEX JOINT SHIELD PO) Take by mouth 2 (two) times daily.        . sertraline (ZOLOFT) 50 MG tablet Take 50 mg by mouth daily.          Not on File  Past Medical History  Diagnosis Date  . HTN (hypertension)   . Bradycardia   . DM (diabetes mellitus)   . Situational stress   . PAF (paroxysmal atrial fibrillation)     Past  Surgical History  Procedure Date  . Hysterectomy - unknown type     History  Smoking status  . Former Smoker -- 0.8 packs/day  . Quit date: 07/17/2011  Smokeless tobacco  . Not on file    History  Alcohol Use No    Family History  Problem Relation Age of Onset  . Heart attack      Reviw of Systems:  Reviewed in the HPI.  All other systems are negative.  Physical Exam: BP 125/66  Pulse 58  Ht 5\' 7"  (1.702 m)  Wt 202 lb 12.8 oz (91.989 kg)  BMI 31.76 kg/m2 The patient is alert and oriented x 3.  The mood and affect are normal.   Skin: warm and dry.  Color is normal.    HEENT:   Normocephalic/atraumatic. Pupils carotids. She is no JVD. Neck is supple.  Lungs: Lungs are clear to auscultation.   Heart: Regular rate S1-S2.    Abdomen: Abdomen soft tissues good bowel sounds.  Extremities:  No clubbing cyanosis or edema  Neuro:  Exam is nonfocal.    ECG: Sinus bradycardia. She has nonspecific ST and T-wave abnormalities. EKG is unchanged from her previous tracing  Assessment / Plan:

## 2011-10-17 ENCOUNTER — Other Ambulatory Visit: Payer: Self-pay | Admitting: *Deleted

## 2011-10-17 MED ORDER — DILTIAZEM HCL ER COATED BEADS 240 MG PO CP24: 240.0000 mg | ORAL_CAPSULE | Freq: Every day | ORAL | Status: DC

## 2011-10-22 ENCOUNTER — Telehealth: Payer: Self-pay | Admitting: Cardiovascular Disease

## 2011-10-22 NOTE — Telephone Encounter (Signed)
Letter of surgical clearance faxed.

## 2011-10-22 NOTE — Telephone Encounter (Signed)
New Msg: Melanie [name] calling wanting to make sure surgical clearance-for teeth extractions was received.    Please return call to discuss further.

## 2011-12-26 ENCOUNTER — Other Ambulatory Visit: Payer: Self-pay

## 2011-12-26 MED ORDER — BISOPROLOL-HYDROCHLOROTHIAZIDE 5-6.25 MG PO TABS: 1.0000 | ORAL_TABLET | Freq: Two times a day (BID) | ORAL | Status: DC

## 2011-12-26 MED ORDER — DILTIAZEM HCL ER COATED BEADS 240 MG PO CP24: 240.0000 mg | ORAL_CAPSULE | Freq: Every day | ORAL | Status: DC

## 2011-12-26 NOTE — Telephone Encounter (Signed)
..   Requested Prescriptions   Signed Prescriptions Disp Refills  . diltiazem (CARDIZEM CD) 240 MG 24 hr capsule 30 capsule 10    Sig: Take 1 capsule (240 mg total) by mouth daily.    Authorizing Provider: Vesta Mixer    Ordering User: Christella Hartigan, Myleigh Amara Judie Petit

## 2011-12-26 NOTE — Telephone Encounter (Signed)
..   Requested Prescriptions   Signed Prescriptions Disp Refills  . bisoprolol-hydrochlorothiazide (ZIAC) 5-6.25 MG per tablet 60 tablet 10    Sig: Take 1 tablet by mouth 2 (two) times daily.    Authorizing Provider: Vesta Mixer    Ordering User: Christella Hartigan, Diamonte Stavely Judie Petit

## 2012-07-07 ENCOUNTER — Other Ambulatory Visit: Payer: Self-pay | Admitting: *Deleted

## 2012-07-07 MED ORDER — LISINOPRIL 20 MG PO TABS: 20.0000 mg | ORAL_TABLET | Freq: Every day | ORAL | Status: DC

## 2012-08-26 ENCOUNTER — Ambulatory Visit: Payer: Medicare Other | Admitting: Cardiovascular Disease

## 2012-08-30 ENCOUNTER — Ambulatory Visit: Payer: Medicare Other | Admitting: Cardiovascular Disease

## 2012-10-04 ENCOUNTER — Ambulatory Visit (INDEPENDENT_AMBULATORY_CARE_PROVIDER_SITE_OTHER): Payer: Medicare Other | Admitting: Cardiovascular Disease

## 2012-10-04 ENCOUNTER — Encounter: Payer: Self-pay | Admitting: Cardiovascular Disease

## 2012-10-04 VITALS — BP 132/70 | HR 63 | Resp 18 | Ht 67.0 in | Wt 192.4 lb

## 2012-10-04 DIAGNOSIS — I10 Essential (primary) hypertension: Secondary | ICD-10-CM

## 2012-10-04 MED ORDER — METOPROLOL TARTRATE 25 MG PO TABS: 25.0000 mg | ORAL_TABLET | Freq: Two times a day (BID) | ORAL | Status: DC

## 2012-10-04 NOTE — Progress Notes (Signed)
Melanie [name] Date of Birth  1946/02/25 [facility] Office  [address]    [address]   [address] [place], [place]  [address]    [place], [place]  [address] [phone]  Fax  [phone]  [phone]  Fax [phone]   Problem List 1. HTN 2. Bradycardia 3. Hyperlipidemia 4. Depression 5. History of chest pain-cardiac catheterization in 2002 revealed normal coronary arteries and normal left ventricle systolic function 6. Former smoker-quit in November, 2012  History of Present Illness: Melanie [name] is seen today for followup visit. She has history of hypertensive heart and bradycardia which has been improved with reduction of Cardizem. In general, she's been doing well and has adequate blood pressure control.   She feels fairly well but unfortunately she's gained a little bit of weight. She still is under tremendous stress taking care of her invalid husband at home. Unable to get a lot of regular exercise.  October 04, 2012: She is doing well.   Unfortunately she started smoking again. She's not having episodes of chest pain or shortness breath. She has a history of paroxysmal atrial fibrillation in the past and has been maintained on metoprolol and diltiazem for long time. She informed that the cost of her diltiazem will be increasing since she'll need to find a replacement   Current Outpatient Prescriptions on File Prior to Visit  Medication Sig Dispense Refill  . aspirin 81 MG tablet Take 81 mg by mouth daily.        Marland Kitchen atorvastatin (LIPITOR) 40 MG tablet Take 40 mg by mouth daily.      . bisoprolol-hydrochlorothiazide (ZIAC) 5-6.25 MG per tablet Take 1 tablet by mouth 2 (two) times daily.  60 tablet  10  . diltiazem (CARDIZEM CD) 240 MG 24 hr capsule Take 1 capsule (240 mg total) by mouth daily.  30 capsule  10  . lisinopril (PRINIVIL,ZESTRIL) 20 MG tablet Take 1 tablet (20 mg total) by mouth daily.  30 tablet  9  . Misc Natural Products  (OSTEO BI-FLEX JOINT SHIELD PO) Take by mouth 2 (two) times daily.        . sertraline (ZOLOFT) 50 MG tablet Take 50 mg by mouth daily.          Not on File  Past Medical History  Diagnosis Date  . HTN (hypertension)   . Bradycardia   . DM (diabetes mellitus)   . Situational stress   . PAF (paroxysmal atrial fibrillation)     Past Surgical History  Procedure Date  . Hysterectomy - unknown type     History  Smoking status  . Former Smoker -- 0.8 packs/day  . Quit date: 07/17/2011  Smokeless tobacco  . Not on file    History  Alcohol Use No    Family History  Problem Relation Age of Onset  . Heart attack      Reviw of Systems:  Reviewed in the HPI.  All other systems are negative.  Physical Exam: BP 132/70  Pulse 63  Resp 18  Ht 5\' 7"  (1.702 m)  Wt 192 lb 6.4 oz (87.272 kg)  BMI 30.13 kg/m2  SpO2 96% The patient is alert and oriented x 3.  The mood and affect are normal.   Skin: warm and dry.  Color is normal.    HEENT:   Normocephalic/atraumatic. Pupils carotids. She is no JVD. Neck is supple.  Lungs: Lungs are clear to auscultation.  Heart: Regular rate S1-S2.    Abdomen: Abdomen soft tissues good bowel sounds.  Extremities:  No clubbing cyanosis or edema  Neuro:  Exam is nonfocal.    ECG:  Assessment / Plan:

## 2012-10-04 NOTE — Patient Instructions (Addendum)
Your physician has recommended you make the following change in your medication:   STOP: ZIAC STOP: DILTIAZEM  START: METOPROLOL 25 MG TWICE DAILY 12 HRS APART  Your physician recommends that you schedule a follow-up appointment in: 1 MONTH WITH Norma Fredrickson, NP  Your physician wants you to follow-up in: 6 MONTHS You will receive a reminder letter in the mail two months in advance. If you don't receive a letter, please call our office to schedule the follow-up appointment.

## 2012-10-04 NOTE — Assessment & Plan Note (Signed)
Melanie [name] is doing well. In an effort to reduce the cause her medications we'll discontinue the Cardizem CD. Will also stop the bisoprolol HCTZ. We'll start her on metoprolol 25 mg twice a day. She may need to be on a slightly higher dose and may also need to be started on a separate HCTZ tablet and potassium tablet.   We'll have her return to see Lawson Fiscal in one month for followup visit. She will be we'll titrate her medications if needed

## 2012-10-11 ENCOUNTER — Telehealth: Payer: Self-pay | Admitting: Cardiovascular Disease

## 2012-10-11 NOTE — Telephone Encounter (Signed)
Pt given rx for metoprolol , but can't take it, sick to stomach, needs to change meds, pls call [phone] , uses [facility]

## 2012-10-11 NOTE — Telephone Encounter (Signed)
**Note De-Identified Denny Lave Obfuscation** Pt states that her Diltiazem and Ziac went up in cost due to change in tier so Dr. Elease Hashimoto replaced with Lopressor 25 mg bid.  Pt started taking Metoprolol on last Tuesday (10/05/12) and she states that she became sick to her stomach and began vomiting and had to stop taking on Friday (1/24). She then resumed Diltiazem and Ziac, she states she is feeling much better at this time  Pt states that the paper concerning tier change on Diltiazem and Ziac, that she brought to Dr. Elease Hashimoto at last OV on 1/20, stated at the bottom of the  page that an exception could be made and she wants Dr Elease Hashimoto or his nurse to try as she has never had any problems taking Diltiazem or Ziac.   Note forwarded to Jodette, Dr. Harvie Bridge nurse.

## 2012-10-12 NOTE — Telephone Encounter (Signed)
Pt agreed to retry the metoprolol, she will take it with food, she will call back with further concerns.

## 2012-10-12 NOTE — Telephone Encounter (Signed)
Dr Elease Hashimoto please advise, pt was on metoprolol which gave the SE of N/V. She went back on diltiazem and Ziac which was on a higher Tier bracket with her insurance. Do you think her N/V was medications or viral? Would you like her to retry or shall I try to get the tier lowered? Please advise

## 2012-10-12 NOTE — Telephone Encounter (Signed)
I have no idea what caused the nausea and vomitting.  She my restart the dilt and ziac if she wants.  You may try to get it approved.   I doubt the metoprolol caused the symtoms

## 2012-11-05 ENCOUNTER — Ambulatory Visit (INDEPENDENT_AMBULATORY_CARE_PROVIDER_SITE_OTHER): Payer: Medicare Other | Admitting: Nurse Practitioner

## 2012-11-05 ENCOUNTER — Encounter: Payer: Self-pay | Admitting: Nurse Practitioner

## 2012-11-05 VITALS — BP 122/66 | HR 74 | Ht 67.0 in | Wt 200.0 lb

## 2012-11-05 DIAGNOSIS — I10 Essential (primary) hypertension: Secondary | ICD-10-CM

## 2012-11-05 DIAGNOSIS — I4891 Unspecified atrial fibrillation: Secondary | ICD-10-CM

## 2012-11-05 DIAGNOSIS — I480 Paroxysmal atrial fibrillation: Secondary | ICD-10-CM

## 2012-11-05 MED ORDER — BISOPROLOL-HYDROCHLOROTHIAZIDE 5-6.25 MG PO TABS: 1.0000 | ORAL_TABLET | Freq: Two times a day (BID) | ORAL | Status: DC

## 2012-11-05 NOTE — Progress Notes (Signed)
[name] Date of Birth: [birth year] Medical Record #[id number]  History of Present Illness: Melanie [name] is seen back today for a one month check. She is seen for Dr. Elease Hashimoto. She has chronic bradycardia, DM, HTN, HLD and depression. She was a former smoker and quit in 2012 - but restarted here earlier this year. She has had remote cath in 2002 which showed normal coronaries and normal LV function. Last nuclear in 2007. She has had PAF.   She was here a month ago. Due to insurance changes/cost of medicines, she has had medicine changes at her last visit with Dr. Elease Hashimoto. He stopped her Cardizem and bisoprolol HCTZ and started metoprolol, HCTZ and potassium.  She comes back today. She is here alone. She has had long standing situational stress with caring for her husband. She is back smoking. No chest pain. Not short of breath. No palpitations. She did not tolerate the medicine changes by Dr. Elease Hashimoto. She tried twice and both times got sick with a headache. She has returned to taking her Ziac. She is back basically taking everything but the Diltiazem. BP at home has been fine.   Current Outpatient Prescriptions on File Prior to Visit  Medication Sig Dispense Refill  . aspirin 81 MG tablet Take 81 mg by mouth daily.        Marland Kitchen atorvastatin (LIPITOR) 40 MG tablet Take 40 mg by mouth daily.      Marland Kitchen lisinopril (PRINIVIL,ZESTRIL) 20 MG tablet Take 1 tablet (20 mg total) by mouth daily.  30 tablet  9  . Misc Natural Products (OSTEO BI-FLEX JOINT SHIELD PO) Take by mouth 2 (two) times daily.        . sertraline (ZOLOFT) 50 MG tablet Take 50 mg by mouth daily.         No current facility-administered medications on file prior to visit.    Allergies  Allergen Reactions  . Metoprolol     Headache    Past Medical History  Diagnosis Date  . HTN (hypertension)   . Bradycardia   . DM (diabetes mellitus)   . Situational stress   . PAF (paroxysmal atrial fibrillation)     Past Surgical History    Procedure Laterality Date  . Hysterectomy - unknown type      History  Smoking status  . Former Smoker -- 0.80 packs/day  . Quit date: 07/17/2011  Smokeless tobacco  . Not on file    History  Alcohol Use No    Family History  Problem Relation Age of Onset  . Heart attack      Review of Systems: The review of systems is per the HPI.  All other systems were reviewed and are negative.  Physical Exam: BP 122/66  Pulse 74  Ht 5\' 7"  (1.702 m)  Wt 200 lb (90.719 kg)  BMI 31.32 kg/m2 Patient is very pleasant and in no acute distress. Skin is warm and dry. Color is normal.  HEENT is unremarkable. Normocephalic/atraumatic. PERRL. Sclera are nonicteric. Neck is supple. No masses. No JVD. Lungs are clear. Cardiac exam shows a regular rate and rhythm. Abdomen is soft. Extremities are without edema. Gait and ROM are intact. No gross neurologic deficits noted.  LABORATORY DATA:  No results found for this basename: WBC,  HGB,  HCT,  PLT,  GLUCOSE,  CHOL,  TRIG,  HDL,  LDLDIRECT,  LDLCALC,  ALT,  AST,  NA,  K,  CL,  CREATININE,  BUN,  CO2,  TSH,  PSA,  INR,  GLUF,  HGBA1C,  MICROALBUR    Assessment / Plan: 1. HTN - BP looks great. I have asked her to monitor at home. I have left her on her current regimen (minus the CCB). I will see her back in 3 months.   2. PAF - no recurrence  3. Situational stress - chronic  Patient is agreeable to this plan and will call if any problems develop in the interim.    3. Bradycardia -  4. Situational stress - chronic  5. Tobacco abuse - cessation is encouraged.   Patient is agreeable to this plan and will call if any problems develop in the interim.

## 2012-11-05 NOTE — Patient Instructions (Addendum)
Stay on your current medicines for now  Monitor your blood pressure at home and keep a diary  I will see you in 3 months  Call the [facility] office at [phone] if you have any questions, problems or concerns.

## 2012-11-09 ENCOUNTER — Encounter: Payer: Self-pay | Admitting: Cardiology

## 2012-12-16 ENCOUNTER — Encounter: Payer: Self-pay | Admitting: Cardiovascular Disease

## 2013-02-02 ENCOUNTER — Ambulatory Visit: Payer: Medicare Other | Admitting: Nurse Practitioner

## 2013-02-22 ENCOUNTER — Ambulatory Visit (INDEPENDENT_AMBULATORY_CARE_PROVIDER_SITE_OTHER): Payer: Medicare Other | Admitting: Physician Assistant

## 2013-02-22 ENCOUNTER — Encounter: Payer: Self-pay | Admitting: Physician Assistant

## 2013-02-22 VITALS — BP 122/70 | HR 82 | Ht 67.0 in | Wt 190.1 lb

## 2013-02-22 DIAGNOSIS — I480 Paroxysmal atrial fibrillation: Secondary | ICD-10-CM

## 2013-02-22 DIAGNOSIS — Z720 Tobacco use: Secondary | ICD-10-CM

## 2013-02-22 DIAGNOSIS — E7800 Pure hypercholesterolemia, unspecified: Secondary | ICD-10-CM

## 2013-02-22 DIAGNOSIS — F17200 Nicotine dependence, unspecified, uncomplicated: Secondary | ICD-10-CM

## 2013-02-22 DIAGNOSIS — I10 Essential (primary) hypertension: Secondary | ICD-10-CM

## 2013-02-22 DIAGNOSIS — I4891 Unspecified atrial fibrillation: Secondary | ICD-10-CM

## 2013-02-22 NOTE — Progress Notes (Signed)
[address] [place], [place]  [address] Phone: [phone] Fax:  [phone]  Date:  02/22/2013   ID:  Melanie [name], Melanie [name] 1946-08-16, MRN [id number]  PCP:  Kari Baars, MD  Cardiologist:  Dr. Delane Ginger     History of Present Illness: Melanie [name] is a 67 y.o. female who returns for follow up.  She has a hx of bradycardia, parox AFib, DM2, HTN, HL, depression, tobacco abuse.  LHC 2002: normal coronary arteries, normal LVF.  Nuclear study 2007 normal, EF 59%.  Last seen by Norma Fredrickson, NP 10/2012.  Since then, she is doing well.  The patient denies chest pain, shortness of breath, syncope, orthopnea, PND or significant pedal edema. She continues to smoke.   Wt Readings from Last 3 Encounters:  02/22/13 190 lb 1.9 oz (86.238 kg)  11/05/12 200 lb (90.719 kg)  10/04/12 192 lb 6.4 oz (87.272 kg)     Past Medical History  Diagnosis Date  . HTN (hypertension)   . Bradycardia   . DM (diabetes mellitus)   . Situational stress   . PAF (paroxysmal atrial fibrillation)     Current Outpatient Prescriptions  Medication Sig Dispense Refill  . aspirin 81 MG tablet Take 81 mg by mouth daily.        Marland Kitchen atorvastatin (LIPITOR) 40 MG tablet Take 40 mg by mouth daily.      . bisoprolol-hydrochlorothiazide (ZIAC) 5-6.25 MG per tablet Take 1 tablet by mouth 2 (two) times daily.  60 tablet  11  . lisinopril (PRINIVIL,ZESTRIL) 20 MG tablet Take 1 tablet (20 mg total) by mouth daily.  30 tablet  9  . metFORMIN (GLUCOPHAGE) 500 MG tablet Take 500 mg by mouth 2 (two) times daily with a meal.      . Misc Natural Products (OSTEO BI-FLEX JOINT SHIELD PO) Take by mouth 2 (two) times daily.        . sertraline (ZOLOFT) 50 MG tablet Take 50 mg by mouth daily.         No current facility-administered medications for this visit.    Allergies:    Allergies  Allergen Reactions  . Metoprolol     Headache    Social History:  The patient  reports that she has been  smoking.  She does not have any smokeless tobacco history on file. She reports that she does not drink alcohol or use illicit drugs.   ROS:  Please see the history of present illness.   She has had recent back and leg pain.  She sees Dr. Ophelia Charter soon for this problem.   All other systems reviewed and negative.   PHYSICAL EXAM: VS:  BP 122/70  Pulse 82  Ht 5\' 7"  (1.702 m)  Wt 190 lb 1.9 oz (86.238 kg)  BMI 29.77 kg/m2  SpO2 99% Well nourished, well developed, in no acute distress HEENT: normal Neck: no JVD Cardiac:  normal S1, S2; RRR; no murmur Lungs:  clear to auscultation bilaterally, no wheezing, rhonchi or rales Abd: soft, nontender, no hepatomegaly Ext: no edema Skin: warm and dry Neuro:  CNs 2-12 intact, no focal abnormalities noted  EKG:  NSR, HR 82, NSSTTW changes     ASSESSMENT AND PLAN:  1. Hypertension:  Controlled.  Continue current therapy.  2. Hyperlipidemia:  Managed by PCP.  3. Atrial Fibrillation:  Maintaining NSR.  CHADS2-VASc=3.  She would need anticoagulation if she has a recurrence. 4. Tobacco Abuse:  We discussed the importance of  cessation and different strategies for quitting.    5. Disposition:  F/u with Norma Fredrickson, NP in 6 mos.   Signed, Tereso Newcomer, PA-C  02/22/2013 12:17 PM

## 2013-02-22 NOTE — Patient Instructions (Addendum)
Continue your same medications. Good luck on quitting smoking. Schedule follow up with Norma Fredrickson, NP in 6 months.    Smoking Cessation Quitting smoking is important to your health and has many advantages. However, it is not always easy to quit since nicotine is a very addictive drug. Often times, people try 3 times or more before being able to quit. This document explains the best ways for you to prepare to quit smoking. Quitting takes hard work and a lot of effort, but you can do it. ADVANTAGES OF QUITTING SMOKING  You will live longer, feel better, and live better.  Your body will feel the impact of quitting smoking almost immediately.  Within 20 minutes, blood pressure decreases. Your pulse returns to its normal level.  After 8 hours, carbon monoxide levels in the blood return to normal. Your oxygen level increases.  After 24 hours, the chance of having a heart attack starts to decrease. Your breath, hair, and body stop smelling like smoke.  After 48 hours, damaged nerve endings begin to recover. Your sense of taste and smell improve.  After 72 hours, the body is virtually free of nicotine. Your bronchial tubes relax and breathing becomes easier.  After 2 to 12 weeks, lungs can hold more air. Exercise becomes easier and circulation improves.  The risk of having a heart attack, stroke, cancer, or lung disease is greatly reduced.  After 1 year, the risk of coronary heart disease is cut in half.  After 5 years, the risk of stroke falls to the same as a nonsmoker.  After 10 years, the risk of lung cancer is cut in half and the risk of other cancers decreases significantly.  After 15 years, the risk of coronary heart disease drops, usually to the level of a nonsmoker.  If you are pregnant, quitting smoking will improve your chances of having a healthy baby.  The people you live with, especially any children, will be healthier.  You will have extra money to spend on things  other than cigarettes. QUESTIONS TO THINK ABOUT BEFORE ATTEMPTING TO QUIT You may want to talk about your answers with your caregiver.  Why do you want to quit?  If you tried to quit in the past, what helped and what did not?  What will be the most difficult situations for you after you quit? How will you plan to handle them?  Who can help you through the tough times? Your family? Friends? A caregiver?  What pleasures do you get from smoking? What ways can you still get pleasure if you quit? Here are some questions to ask your caregiver:  How can you help me to be successful at quitting?  What medicine do you think would be best for me and how should I take it?  What should I do if I need more help?  What is smoking withdrawal like? How can I get information on withdrawal? GET READY  Set a quit date.  Change your environment by getting rid of all cigarettes, ashtrays, matches, and lighters in your home, car, or work. Do not let people smoke in your home.  Review your past attempts to quit. Think about what worked and what did not. GET SUPPORT AND ENCOURAGEMENT You have a better chance of being successful if you have help. You can get support in many ways.  Tell your family, friends, and co-workers that you are going to quit and need their support. Ask them not to smoke around you.  Get  individual, group, or telephone counseling and support. Programs are available at [employer] and health centers. Call your local health department for information about programs in your area.  Spiritual beliefs and practices may help some smokers quit.  Download a "quit meter" on your computer to keep track of quit statistics, such as how long you have gone without smoking, cigarettes not smoked, and money saved.  Get a self-help book about quitting smoking and staying off of tobacco. LEARN NEW SKILLS AND BEHAVIORS  Distract yourself from urges to smoke. Talk to someone, go for a walk,  or occupy your time with a task.  Change your normal routine. Take a different route to work. Drink tea instead of coffee. Eat breakfast in a different place.  Reduce your stress. Take a hot bath, exercise, or read a book.  Plan something enjoyable to do every day. Reward yourself for not smoking.  Explore interactive web-based programs that specialize in helping you quit. GET MEDICINE AND USE IT CORRECTLY Medicines can help you stop smoking and decrease the urge to smoke. Combining medicine with the above behavioral methods and support can greatly increase your chances of successfully quitting smoking.  Nicotine replacement therapy helps deliver nicotine to your body without the negative effects and risks of smoking. Nicotine replacement therapy includes nicotine gum, lozenges, inhalers, nasal sprays, and skin patches. Some may be available over-the-counter and others require a prescription.  Antidepressant medicine helps people abstain from smoking, but how this works is unknown. This medicine is available by prescription.  Nicotinic receptor partial agonist medicine simulates the effect of nicotine in your brain. This medicine is available by prescription. Ask your caregiver for advice about which medicines to use and how to use them based on your health history. Your caregiver will tell you what side effects to look out for if you choose to be on a medicine or therapy. Carefully read the information on the package. Do not use any other product containing nicotine while using a nicotine replacement product.  RELAPSE OR DIFFICULT SITUATIONS Most relapses occur within the first 3 months after quitting. Do not be discouraged if you start smoking again. Remember, most people try several times before finally quitting. You may have symptoms of withdrawal because your body is used to nicotine. You may crave cigarettes, be irritable, feel very hungry, cough often, get headaches, or have difficulty  concentrating. The withdrawal symptoms are only temporary. They are strongest when you first quit, but they will go away within 10 14 days. To reduce the chances of relapse, try to:  Avoid drinking alcohol. Drinking lowers your chances of successfully quitting.  Reduce the amount of caffeine you consume. Once you quit smoking, the amount of caffeine in your body increases and can give you symptoms, such as a rapid heartbeat, sweating, and anxiety.  Avoid smokers because they can make you want to smoke.  Do not let weight gain distract you. Many smokers will gain weight when they quit, usually less than 10 pounds. Eat a healthy diet and stay active. You can always lose the weight gained after you quit.  Find ways to improve your mood other than smoking. FOR MORE INFORMATION  www.smokefree.gov  Document Released: 08/26/2001 Document Revised: 03/02/2012 Document Reviewed: 12/11/2011 [facility] Patient Information 2014 [place], [place].

## 2013-03-03 ENCOUNTER — Other Ambulatory Visit: Payer: Self-pay | Admitting: Orthopaedic Surgery

## 2013-03-03 DIAGNOSIS — M79605 Pain in left leg: Secondary | ICD-10-CM

## 2013-03-03 DIAGNOSIS — M79604 Pain in right leg: Secondary | ICD-10-CM

## 2013-03-14 ENCOUNTER — Ambulatory Visit
Admission: RE | Admit: 2013-03-14 | Discharge: 2013-03-14 | Disposition: A | Discharge status: Home / Self Care | Payer: Medicare Other | Source: Ambulatory Visit | Attending: Orthopaedic Surgery | Admitting: Orthopaedic Surgery

## 2013-03-14 DIAGNOSIS — M79605 Pain in left leg: Secondary | ICD-10-CM

## 2013-03-14 DIAGNOSIS — M79604 Pain in right leg: Secondary | ICD-10-CM

## 2013-03-14 MED ORDER — GADOBENATE DIMEGLUMINE 529 MG/ML IV SOLN
9.0000 mL | Freq: Once | INTRAVENOUS | Status: AC | PRN
  Administered 2013-03-14: 9 mL via INTRAVENOUS

## 2013-05-19 ENCOUNTER — Other Ambulatory Visit: Payer: Self-pay

## 2013-05-19 MED ORDER — LISINOPRIL 20 MG PO TABS: 20.0000 mg | ORAL_TABLET | Freq: Every day | ORAL | Status: DC

## 2013-06-10 DIAGNOSIS — R809 Proteinuria, unspecified: Secondary | ICD-10-CM | POA: Diagnosis not present

## 2013-06-17 ENCOUNTER — Other Ambulatory Visit (HOSPITAL_COMMUNITY): Payer: Self-pay | Admitting: Internal Medicine

## 2013-06-17 ENCOUNTER — Ambulatory Visit (HOSPITAL_COMMUNITY)
Admission: RE | Admit: 2013-06-17 | Discharge: 2013-06-17 | Disposition: A | Discharge status: Home / Self Care | Payer: Medicare Other | Source: Ambulatory Visit | Attending: Vascular Surgery | Admitting: Vascular Surgery

## 2013-06-17 DIAGNOSIS — E278 Other specified disorders of adrenal gland: Secondary | ICD-10-CM | POA: Diagnosis not present

## 2013-06-17 DIAGNOSIS — E785 Hyperlipidemia, unspecified: Secondary | ICD-10-CM | POA: Diagnosis not present

## 2013-06-17 DIAGNOSIS — M79609 Pain in unspecified limb: Secondary | ICD-10-CM

## 2013-06-17 DIAGNOSIS — IMO0002 Reserved for concepts with insufficient information to code with codable children: Secondary | ICD-10-CM | POA: Diagnosis not present

## 2013-06-17 DIAGNOSIS — I10 Essential (primary) hypertension: Secondary | ICD-10-CM | POA: Diagnosis not present

## 2013-06-17 DIAGNOSIS — Z6831 Body mass index (BMI) 31.0-31.9, adult: Secondary | ICD-10-CM | POA: Diagnosis not present

## 2013-06-17 DIAGNOSIS — E119 Type 2 diabetes mellitus without complications: Secondary | ICD-10-CM | POA: Diagnosis not present

## 2013-06-17 DIAGNOSIS — Z0000 Encounter for general adult medical examination without abnormal findings: Secondary | ICD-10-CM | POA: Diagnosis not present

## 2013-06-17 DIAGNOSIS — Z23 Encounter for immunization: Secondary | ICD-10-CM | POA: Diagnosis not present

## 2013-06-17 DIAGNOSIS — N183 Chronic kidney disease, stage 3 unspecified: Secondary | ICD-10-CM | POA: Diagnosis not present

## 2013-06-20 ENCOUNTER — Encounter: Payer: Self-pay | Admitting: Internal Medicine

## 2013-06-21 DIAGNOSIS — Z1212 Encounter for screening for malignant neoplasm of rectum: Secondary | ICD-10-CM | POA: Diagnosis not present

## 2013-07-05 DIAGNOSIS — M48061 Spinal stenosis, lumbar region without neurogenic claudication: Secondary | ICD-10-CM | POA: Diagnosis not present

## 2013-07-08 ENCOUNTER — Other Ambulatory Visit (HOSPITAL_COMMUNITY): Payer: Self-pay | Admitting: Orthopaedic Surgery

## 2013-07-12 DIAGNOSIS — M549 Dorsalgia, unspecified: Secondary | ICD-10-CM | POA: Diagnosis not present

## 2013-07-14 ENCOUNTER — Encounter (HOSPITAL_COMMUNITY): Payer: Self-pay | Admitting: Pharmacy Technician

## 2013-07-15 ENCOUNTER — Encounter (HOSPITAL_COMMUNITY): Payer: Self-pay

## 2013-07-15 ENCOUNTER — Ambulatory Visit (HOSPITAL_COMMUNITY)
Admission: RE | Admit: 2013-07-15 | Discharge: 2013-07-15 | Disposition: A | Discharge status: Home / Self Care | Payer: Medicare Other | Source: Ambulatory Visit | Attending: Orthopaedic Surgery | Admitting: Orthopaedic Surgery

## 2013-07-15 ENCOUNTER — Encounter (HOSPITAL_COMMUNITY)
Admission: RE | Admit: 2013-07-15 | Discharge: 2013-07-15 | Disposition: A | Discharge status: Home / Self Care | Payer: Medicare Other | Source: Ambulatory Visit | Attending: Orthopaedic Surgery | Admitting: Orthopaedic Surgery

## 2013-07-15 DIAGNOSIS — Z01818 Encounter for other preprocedural examination: Secondary | ICD-10-CM | POA: Diagnosis not present

## 2013-07-15 DIAGNOSIS — M47814 Spondylosis without myelopathy or radiculopathy, thoracic region: Secondary | ICD-10-CM | POA: Insufficient documentation

## 2013-07-15 DIAGNOSIS — Z01811 Encounter for preprocedural respiratory examination: Secondary | ICD-10-CM | POA: Insufficient documentation

## 2013-07-15 DIAGNOSIS — J8410 Pulmonary fibrosis, unspecified: Secondary | ICD-10-CM | POA: Diagnosis not present

## 2013-07-15 DIAGNOSIS — M48061 Spinal stenosis, lumbar region without neurogenic claudication: Secondary | ICD-10-CM | POA: Diagnosis not present

## 2013-07-15 HISTORY — DX: Family history of other specified conditions: Z84.89

## 2013-07-15 HISTORY — DX: Unspecified osteoarthritis, unspecified site: M19.90

## 2013-07-15 HISTORY — DX: Gastro-esophageal reflux disease without esophagitis: K21.9

## 2013-07-15 LAB — COMPREHENSIVE METABOLIC PANEL
ALT: 13 U/L (ref 0–35)
AST: 16 U/L (ref 0–37)
Albumin: 3.5 g/dL (ref 3.5–5.2)
Alkaline Phosphatase: 88 U/L (ref 39–117)
BUN: 13 mg/dL (ref 6–23)
CO2: 26 mEq/L (ref 19–32)
Calcium: 9.3 mg/dL (ref 8.4–10.5)
Chloride: 97 mEq/L (ref 96–112)
Creatinine, Ser: 0.94 mg/dL (ref 0.50–1.10)
GFR calc Af Amer: 72 mL/min — ABNORMAL LOW (ref >90)
GFR calc non Af Amer: 62 mL/min — ABNORMAL LOW (ref >90)
Glucose, Bld: 276 mg/dL — ABNORMAL HIGH (ref 70–99)
Potassium: 3.9 mEq/L (ref 3.5–5.1)
Sodium: 135 mEq/L (ref 135–145)
Total Bilirubin: 0.1 mg/dL — ABNORMAL LOW (ref 0.3–1.2)
Total Protein: 7 g/dL (ref 6.0–8.3)

## 2013-07-15 LAB — PROTIME-INR
INR: 0.94 (ref 0.00–1.49)
Prothrombin Time: 12.4 seconds (ref 11.6–15.2)

## 2013-07-15 LAB — CBC
HCT: 37.5 % (ref 36.0–46.0)
Hemoglobin: 12.6 g/dL (ref 12.0–15.0)
MCH: 33.2 pg (ref 26.0–34.0)
MCHC: 33.6 g/dL (ref 30.0–36.0)
MCV: 98.9 fL (ref 78.0–100.0)
Platelets: 210 10*3/uL (ref 150–400)
RBC: 3.79 MIL/uL — ABNORMAL LOW (ref 3.87–5.11)
RDW: 14.1 % (ref 11.5–15.5)
WBC: 7.8 10*3/uL (ref 4.0–10.5)

## 2013-07-15 LAB — SURGICAL PCR SCREEN
MRSA, PCR: NEGATIVE
Staphylococcus aureus: NEGATIVE

## 2013-07-15 NOTE — Pre-Procedure Instructions (Signed)
Melanie [name]  07/15/2013   Your procedure is scheduled on:  Nov 5 at 1230  Report to [facility] Short Stay [facility]  2 * 3 at 1030 AM.  Call this number if you have problems the morning of surgery: [phone]   Remember:   Do not eat food or drink liquids after midnight.   Take these medicines the morning of surgery with A SIP OF WATER: Ziac (bisoprolol-hydrochlorothiazide), Neurontin (Gabapentin), Zoloft (sertraine), Norco (Hydrocodone-acetaminophen) if needed  Stop taking aspirin, aleve, ibuprofen, BC's, Goody's, Herbal medications, and Fish oil.   Do not wear jewelry, make-up or nail polish.  Do not wear lotions, powders, or perfumes. You may wear deodorant.  Do not shave 48 hours prior to surgery. Men may shave face and neck.  Do not bring valuables to the hospital.  [facility] is not responsible                  for any belongings or valuables.               Contacts, dentures or bridgework may not be worn into surgery.  Leave suitcase in the car. After surgery it may be brought to your room.  For patients admitted to the hospital, discharge time is determined by your                treatment team.               Patients discharged the day of surgery will not be allowed to drive  home.    Special Instructions: Shower using CHG 2 nights before surgery and the night before surgery.  If you shower the day of surgery use CHG.  Use special wash - you have one bottle of CHG for all showers.  You should use approximately 1/3 of the bottle for each shower.   Please read over the following fact sheets that you were given: Pain Booklet, Coughing and Deep Breathing, MRSA Information and Surgical Site Infection Prevention

## 2013-07-15 NOTE — Progress Notes (Signed)
PCP is Dr Lacretia Nicks. Melanie [name] Cardiologist is Dr Katherina Right Last stress test was from 1999 Last card cath from 2002 Denies having an echo. EKG noted in epic from 02-22-2013 Denies having a recent CXR

## 2013-07-18 NOTE — H&P (Signed)
Melanie [name] is an 67 y.o. female.   Chief Complaint: back and leg pain HPI:She is having ongoing persistent problems with right leg pain, weakness, numbness over the anterior aspect of her thigh. Her pain is worse when she first gets up in the morning, puts her feet on the floor and starts walking.  When she first wakes in bed she is not having any pain..  She states she has to lie flat in the bed to get relief.    She did not get any relief from the L2-3 foraminal injection. Does have increased pain with ambulation.  Previous MRI showed some 3 mm posterior subluxation at L1-2, L2-3.  Atrophic left kidney noted.  Mild central stenosis at L3-4 and moderate right and mild left foraminal stenosis 5-1.  Showed mild facet changes.  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          Past Medical History  Diagnosis Date  . HTN (hypertension)   . Bradycardia   . DM (diabetes mellitus)   . Situational stress   . PAF (paroxysmal atrial fibrillation)   . Family history of anesthesia complication     mother had problems, but unsure of what they were  . GERD (gastroesophageal reflux disease)   . Arthritis     Past Surgical History  Procedure Laterality Date  . Hysterectomy - unknown type    . Knee replacements Bilateral 2007    aug and oct of 2007  . Carpal tunnel release Bilateral 2003  . Back surgery  2004  . Appendectomy    . Abdominal hysterectomy    . Joint replacement      both knees  . Colonoscopy      Family History  Problem Relation Age of Onset  . Heart attack     Social History:  reports that she has been smoking.  She does not have any smokeless tobacco history on file. She reports that she does not drink alcohol or use illicit drugs.  Allergies:  Allergies  Allergen Reactions  . Metoprolol Other (See Comments)    REACTION: Headache    No prescriptions prior to admission    No results found for this or any previous visit (from the past 48  hour(s)). No results found.  Review of Systems  Musculoskeletal: Positive for back pain.  Neurological:       Right leg pain and weakness   All other systems reviewed and are negative.    There were no vitals taken for this visit. Physical Exam  Constitutional: She is oriented to person, place, and time. She appears well-developed and well-nourished.  HENT:  Head: Normocephalic and atraumatic.  Eyes: EOM are normal. Pupils are equal, round, and reactive to light.  Neck: Normal range of motion.  Cardiovascular: Normal rate.   Respiratory: Effort normal.  GI: Soft.  Musculoskeletal:    She still has slight quad weakness on the left, but this may be related to her total knee arthroplasty that she had in 2007.  Right total knee arthroplasty incision is well-healed and she has good quad strength.  Abductors are normal.  No edema of the lower extremities.  No venous stasis changes and distal pulses DP and PT are normal.    Neurological: She is alert and oriented to person, place, and time.  Skin: Skin is warm and dry.  Psychiatric: She has a normal mood and affect.     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

## 2013-07-19 MED ORDER — CEFAZOLIN SODIUM-DEXTROSE 2-3 GM-% IV SOLR
2.0000 g | INTRAVENOUS | Status: AC
  Administered 2013-07-20: 2 g via INTRAVENOUS
  Filled 2013-07-19: qty 50

## 2013-07-20 ENCOUNTER — Inpatient Hospital Stay (HOSPITAL_COMMUNITY): Payer: Medicare Other | Admitting: Anesthesiology

## 2013-07-20 ENCOUNTER — Observation Stay (HOSPITAL_COMMUNITY)
Admission: RE | Admit: 2013-07-20 | Discharge: 2013-07-21 | Disposition: A | Discharge status: Home / Self Care | Payer: Medicare Other | Source: Ambulatory Visit | Attending: Orthopaedic Surgery | Admitting: Orthopaedic Surgery

## 2013-07-20 ENCOUNTER — Encounter (HOSPITAL_COMMUNITY): Payer: Self-pay | Admitting: *Deleted

## 2013-07-20 ENCOUNTER — Encounter (HOSPITAL_COMMUNITY)
Admission: RE | Disposition: A | Discharge status: Home / Self Care | Payer: Self-pay | Source: Ambulatory Visit | Attending: Orthopaedic Surgery

## 2013-07-20 ENCOUNTER — Encounter (HOSPITAL_COMMUNITY): Payer: Medicare Other | Admitting: Anesthesiology

## 2013-07-20 ENCOUNTER — Inpatient Hospital Stay (HOSPITAL_COMMUNITY): Payer: Medicare Other

## 2013-07-20 DIAGNOSIS — I10 Essential (primary) hypertension: Secondary | ICD-10-CM | POA: Diagnosis not present

## 2013-07-20 DIAGNOSIS — I4891 Unspecified atrial fibrillation: Secondary | ICD-10-CM | POA: Insufficient documentation

## 2013-07-20 DIAGNOSIS — M519 Unspecified thoracic, thoracolumbar and lumbosacral intervertebral disc disorder: Secondary | ICD-10-CM | POA: Diagnosis not present

## 2013-07-20 DIAGNOSIS — E119 Type 2 diabetes mellitus without complications: Secondary | ICD-10-CM | POA: Insufficient documentation

## 2013-07-20 DIAGNOSIS — M48061 Spinal stenosis, lumbar region without neurogenic claudication: Principal | ICD-10-CM | POA: Diagnosis present

## 2013-07-20 DIAGNOSIS — M5126 Other intervertebral disc displacement, lumbar region: Secondary | ICD-10-CM | POA: Diagnosis not present

## 2013-07-20 DIAGNOSIS — M545 Low back pain: Secondary | ICD-10-CM | POA: Diagnosis not present

## 2013-07-20 DIAGNOSIS — F17200 Nicotine dependence, unspecified, uncomplicated: Secondary | ICD-10-CM | POA: Insufficient documentation

## 2013-07-20 DIAGNOSIS — K219 Gastro-esophageal reflux disease without esophagitis: Secondary | ICD-10-CM | POA: Insufficient documentation

## 2013-07-20 DIAGNOSIS — I498 Other specified cardiac arrhythmias: Secondary | ICD-10-CM | POA: Insufficient documentation

## 2013-07-20 DIAGNOSIS — IMO0002 Reserved for concepts with insufficient information to code with codable children: Secondary | ICD-10-CM | POA: Diagnosis not present

## 2013-07-20 HISTORY — PX: LUMBAR LAMINECTOMY: SHX95

## 2013-07-20 LAB — GLUCOSE, CAPILLARY
Glucose-Capillary: 189 mg/dL — ABNORMAL HIGH (ref 70–99)
Glucose-Capillary: 149 mg/dL — ABNORMAL HIGH (ref 70–99)
Glucose-Capillary: 136 mg/dL — ABNORMAL HIGH (ref 70–99)
Glucose-Capillary: 241 mg/dL — ABNORMAL HIGH (ref 70–99)

## 2013-07-20 SURGERY — MICRODISCECTOMY LUMBAR LAMINECTOMY
Anesthesia: General | Site: Spine Lumbar | Wound class: Clean

## 2013-07-20 MED ORDER — METFORMIN HCL 500 MG PO TABS
500.0000 mg | ORAL_TABLET | Freq: Two times a day (BID) | ORAL | Status: DC
  Administered 2013-07-21: 500 mg via ORAL
  Filled 2013-07-20 (×3): qty 1

## 2013-07-20 MED ORDER — HYDROCODONE-ACETAMINOPHEN 5-325 MG PO TABS: 1.0000 | ORAL_TABLET | ORAL | Status: DC | PRN

## 2013-07-20 MED ORDER — HYDROMORPHONE HCL PF 1 MG/ML IJ SOLN
0.2500 mg | INTRAMUSCULAR | Status: DC | PRN
  Administered 2013-07-20 (×3): 0.5 mg via INTRAVENOUS

## 2013-07-20 MED ORDER — SODIUM CHLORIDE 0.9 % IJ SOLN: 3.0000 mL | INTRAMUSCULAR | Status: DC | PRN

## 2013-07-20 MED ORDER — ASPIRIN EC 81 MG PO TBEC
81.0000 mg | DELAYED_RELEASE_TABLET | Freq: Every day | ORAL | Status: DC
  Administered 2013-07-21: 81 mg via ORAL
  Filled 2013-07-20: qty 1

## 2013-07-20 MED ORDER — ALBUMIN HUMAN 5 % IV SOLN
INTRAVENOUS | Status: DC | PRN
  Administered 2013-07-20: 14:00:00 via INTRAVENOUS

## 2013-07-20 MED ORDER — POTASSIUM CHLORIDE IN NACL 20-0.45 MEQ/L-% IV SOLN
INTRAVENOUS | Status: DC
  Administered 2013-07-21: 07:00:00 via INTRAVENOUS
  Filled 2013-07-20 (×5): qty 1000

## 2013-07-20 MED ORDER — SENNOSIDES-DOCUSATE SODIUM 8.6-50 MG PO TABS: 1.0000 | ORAL_TABLET | Freq: Every evening | ORAL | Status: DC | PRN

## 2013-07-20 MED ORDER — LACTATED RINGERS IV SOLN
INTRAVENOUS | Status: DC
  Administered 2013-07-20: 11:00:00 via INTRAVENOUS

## 2013-07-20 MED ORDER — PHENYLEPHRINE HCL 10 MG/ML IJ SOLN
INTRAMUSCULAR | Status: DC | PRN
  Administered 2013-07-20: 80 ug via INTRAVENOUS

## 2013-07-20 MED ORDER — LACTATED RINGERS IV SOLN
INTRAVENOUS | Status: DC | PRN
  Administered 2013-07-20 (×2): via INTRAVENOUS

## 2013-07-20 MED ORDER — CEFAZOLIN SODIUM 1-5 GM-% IV SOLN
1.0000 g | Freq: Three times a day (TID) | INTRAVENOUS | Status: AC
  Administered 2013-07-20 – 2013-07-21 (×2): 1 g via INTRAVENOUS
  Filled 2013-07-20 (×2): qty 50

## 2013-07-20 MED ORDER — METHOCARBAMOL 100 MG/ML IJ SOLN
500.0000 mg | Freq: Four times a day (QID) | INTRAVENOUS | Status: DC | PRN
  Filled 2013-07-20: qty 5

## 2013-07-20 MED ORDER — FENTANYL CITRATE 0.05 MG/ML IJ SOLN
INTRAMUSCULAR | Status: DC | PRN
  Administered 2013-07-20 (×6): 50 ug via INTRAVENOUS
  Administered 2013-07-20: 100 ug via INTRAVENOUS

## 2013-07-20 MED ORDER — GLYCOPYRROLATE 0.2 MG/ML IJ SOLN
INTRAMUSCULAR | Status: DC | PRN
  Administered 2013-07-20: 0.6 mg via INTRAVENOUS

## 2013-07-20 MED ORDER — MENTHOL 3 MG MT LOZG: 1.0000 | LOZENGE | OROMUCOSAL | Status: DC | PRN

## 2013-07-20 MED ORDER — PHENOL 1.4 % MT LIQD: 1.0000 | OROMUCOSAL | Status: DC | PRN

## 2013-07-20 MED ORDER — ACETAMINOPHEN 650 MG RE SUPP: 650.0000 mg | RECTAL | Status: DC | PRN

## 2013-07-20 MED ORDER — NEOSTIGMINE METHYLSULFATE 1 MG/ML IJ SOLN
INTRAMUSCULAR | Status: DC | PRN
  Administered 2013-07-20: 4 mg via INTRAVENOUS

## 2013-07-20 MED ORDER — GABAPENTIN 300 MG PO CAPS
300.0000 mg | ORAL_CAPSULE | Freq: Three times a day (TID) | ORAL | Status: DC | PRN
  Filled 2013-07-20: qty 1

## 2013-07-20 MED ORDER — ONDANSETRON HCL 4 MG/2ML IJ SOLN
INTRAMUSCULAR | Status: DC | PRN
  Administered 2013-07-20: 4 mg via INTRAVENOUS

## 2013-07-20 MED ORDER — METHOCARBAMOL 500 MG PO TABS: 500.0000 mg | ORAL_TABLET | Freq: Four times a day (QID) | ORAL | Status: DC | PRN

## 2013-07-20 MED ORDER — PROPOFOL 10 MG/ML IV BOLUS
INTRAVENOUS | Status: DC | PRN
  Administered 2013-07-20: 170 mg via INTRAVENOUS
  Administered 2013-07-20: 30 mg via INTRAVENOUS

## 2013-07-20 MED ORDER — INSULIN ASPART 100 UNIT/ML ~~LOC~~ SOLN
0.0000 [IU] | Freq: Three times a day (TID) | SUBCUTANEOUS | Status: DC
  Administered 2013-07-21 (×2): 3 [IU] via SUBCUTANEOUS

## 2013-07-20 MED ORDER — METHOCARBAMOL 500 MG PO TABS: 500.0000 mg | ORAL_TABLET | Freq: Three times a day (TID) | ORAL | Status: DC | PRN

## 2013-07-20 MED ORDER — FLEET ENEMA 7-19 GM/118ML RE ENEM: 1.0000 | ENEMA | Freq: Once | RECTAL | Status: AC | PRN

## 2013-07-20 MED ORDER — ROCURONIUM BROMIDE 100 MG/10ML IV SOLN
INTRAVENOUS | Status: DC | PRN
  Administered 2013-07-20: 50 mg via INTRAVENOUS

## 2013-07-20 MED ORDER — LIDOCAINE HCL (CARDIAC) 20 MG/ML IV SOLN
INTRAVENOUS | Status: DC | PRN
  Administered 2013-07-20: 80 mg via INTRAVENOUS

## 2013-07-20 MED ORDER — HYDROMORPHONE HCL PF 1 MG/ML IJ SOLN
INTRAMUSCULAR | Status: AC
  Filled 2013-07-20: qty 1

## 2013-07-20 MED ORDER — LISINOPRIL 20 MG PO TABS
20.0000 mg | ORAL_TABLET | Freq: Every day | ORAL | Status: DC
  Administered 2013-07-20: 20 mg via ORAL
  Filled 2013-07-20 (×2): qty 1

## 2013-07-20 MED ORDER — MIDAZOLAM HCL 5 MG/5ML IJ SOLN
INTRAMUSCULAR | Status: DC | PRN
  Administered 2013-07-20: 2 mg via INTRAVENOUS

## 2013-07-20 MED ORDER — ARTIFICIAL TEARS OP OINT
TOPICAL_OINTMENT | OPHTHALMIC | Status: DC | PRN
  Administered 2013-07-20: 1 via OPHTHALMIC

## 2013-07-20 MED ORDER — SODIUM CHLORIDE 0.9 % IV SOLN: 250.0000 mL | INTRAVENOUS | Status: DC

## 2013-07-20 MED ORDER — DOCUSATE SODIUM 100 MG PO CAPS
100.0000 mg | ORAL_CAPSULE | Freq: Two times a day (BID) | ORAL | Status: DC
  Administered 2013-07-20 – 2013-07-21 (×2): 100 mg via ORAL
  Filled 2013-07-20 (×3): qty 1

## 2013-07-20 MED ORDER — THROMBIN 20000 UNITS EX SOLR
CUTANEOUS | Status: AC
  Filled 2013-07-20: qty 20000

## 2013-07-20 MED ORDER — KETOROLAC TROMETHAMINE 30 MG/ML IJ SOLN
30.0000 mg | Freq: Three times a day (TID) | INTRAMUSCULAR | Status: DC
  Administered 2013-07-20 – 2013-07-21 (×2): 30 mg via INTRAVENOUS
  Filled 2013-07-20 (×3): qty 1

## 2013-07-20 MED ORDER — SODIUM CHLORIDE 0.9 % IJ SOLN
3.0000 mL | Freq: Two times a day (BID) | INTRAMUSCULAR | Status: DC
  Administered 2013-07-21: 3 mL via INTRAVENOUS

## 2013-07-20 MED ORDER — MORPHINE SULFATE 2 MG/ML IJ SOLN: 1.0000 mg | INTRAMUSCULAR | Status: DC | PRN

## 2013-07-20 MED ORDER — OXYCODONE-ACETAMINOPHEN 5-325 MG PO TABS
1.0000 | ORAL_TABLET | ORAL | Status: DC | PRN
  Administered 2013-07-20 – 2013-07-21 (×3): 2 via ORAL
  Filled 2013-07-20 (×3): qty 2

## 2013-07-20 MED ORDER — BUPIVACAINE HCL (PF) 0.25 % IJ SOLN
INTRAMUSCULAR | Status: AC
  Filled 2013-07-20: qty 30

## 2013-07-20 MED ORDER — INSULIN ASPART 100 UNIT/ML ~~LOC~~ SOLN
0.0000 [IU] | Freq: Every day | SUBCUTANEOUS | Status: DC
  Administered 2013-07-20: 2 [IU] via SUBCUTANEOUS

## 2013-07-20 MED ORDER — SERTRALINE HCL 50 MG PO TABS
50.0000 mg | ORAL_TABLET | Freq: Every day | ORAL | Status: DC
  Administered 2013-07-21: 50 mg via ORAL
  Filled 2013-07-20: qty 1

## 2013-07-20 MED ORDER — PANTOPRAZOLE SODIUM 40 MG IV SOLR
40.0000 mg | Freq: Every day | INTRAVENOUS | Status: DC
  Administered 2013-07-20: 40 mg via INTRAVENOUS
  Filled 2013-07-20 (×2): qty 40

## 2013-07-20 MED ORDER — ONDANSETRON HCL 4 MG/2ML IJ SOLN: 4.0000 mg | INTRAMUSCULAR | Status: DC | PRN

## 2013-07-20 MED ORDER — OXYCODONE-ACETAMINOPHEN 5-325 MG PO TABS: 1.0000 | ORAL_TABLET | ORAL | Status: DC | PRN

## 2013-07-20 MED ORDER — ACETAMINOPHEN 325 MG PO TABS: 650.0000 mg | ORAL_TABLET | ORAL | Status: DC | PRN

## 2013-07-20 MED ORDER — BISACODYL 10 MG RE SUPP: 10.0000 mg | Freq: Every day | RECTAL | Status: DC | PRN

## 2013-07-20 MED ORDER — BISOPROLOL-HYDROCHLOROTHIAZIDE 5-6.25 MG PO TABS
1.0000 | ORAL_TABLET | Freq: Two times a day (BID) | ORAL | Status: DC
  Filled 2013-07-20 (×3): qty 1

## 2013-07-20 MED ORDER — ZOLPIDEM TARTRATE 5 MG PO TABS: 5.0000 mg | ORAL_TABLET | Freq: Every evening | ORAL | Status: DC | PRN

## 2013-07-20 MED ORDER — 0.9 % SODIUM CHLORIDE (POUR BTL) OPTIME
TOPICAL | Status: DC | PRN
  Administered 2013-07-20: 1000 mL

## 2013-07-20 SURGICAL SUPPLY — 56 items
ADH SKN CLS APL DERMABOND .7 (GAUZE/BANDAGES/DRESSINGS) ×1
BUR ROUND FLUTED 4 SOFT TCH (BURR) ×1 IMPLANT
CANISTER SUCTION 2500CC (MISCELLANEOUS) ×1 IMPLANT
CLOTH BEACON ORANGE TIMEOUT ST (SAFETY) ×1 IMPLANT
CORDS BIPOLAR (ELECTRODE) ×2 IMPLANT
COVER SURGICAL LIGHT HANDLE (MISCELLANEOUS) ×2 IMPLANT
DERMABOND ADVANCED (GAUZE/BANDAGES/DRESSINGS) ×1
DERMABOND ADVANCED .7 DNX12 (GAUZE/BANDAGES/DRESSINGS) ×1 IMPLANT
DRAPE MICROSCOPE LEICA (MISCELLANEOUS) ×2 IMPLANT
DRAPE PROXIMA HALF (DRAPES) ×4 IMPLANT
DRSG EMULSION OIL 3X3 NADH (GAUZE/BANDAGES/DRESSINGS) ×1 IMPLANT
DRSG MEPILEX BORDER 4X4 (GAUZE/BANDAGES/DRESSINGS) ×2 IMPLANT
DRSG MEPILEX BORDER 4X8 (GAUZE/BANDAGES/DRESSINGS) ×1 IMPLANT
DURAPREP 26ML APPLICATOR (WOUND CARE) ×2 IMPLANT
ELECT REM PT RETURN 9FT ADLT (ELECTROSURGICAL) ×2
ELECTRODE REM PT RTRN 9FT ADLT (ELECTROSURGICAL) ×1 IMPLANT
GLOVE BIOGEL PI IND STRL 7.0 (GLOVE) IMPLANT
GLOVE BIOGEL PI IND STRL 7.5 (GLOVE) ×1 IMPLANT
GLOVE BIOGEL PI IND STRL 8 (GLOVE) ×1 IMPLANT
GLOVE BIOGEL PI INDICATOR 7.0 (GLOVE) ×1
GLOVE BIOGEL PI INDICATOR 7.5 (GLOVE) ×1
GLOVE BIOGEL PI INDICATOR 8 (GLOVE) ×1
GLOVE ECLIPSE 6.5 STRL STRAW (GLOVE) ×1 IMPLANT
GLOVE ECLIPSE 7.0 STRL STRAW (GLOVE) ×2 IMPLANT
GLOVE ORTHO TXT STRL SZ7.5 (GLOVE) ×2 IMPLANT
GOWN PREVENTION PLUS LG XLONG (DISPOSABLE) ×1 IMPLANT
GOWN STRL NON-REIN LRG LVL3 (GOWN DISPOSABLE) ×4 IMPLANT
GOWN STRL REIN 2XL LVL4 (GOWN DISPOSABLE) ×1 IMPLANT
KIT BASIN OR (CUSTOM PROCEDURE TRAY) ×2 IMPLANT
KIT ROOM TURNOVER OR (KITS) ×2 IMPLANT
MANIFOLD NEPTUNE II (INSTRUMENTS) ×1 IMPLANT
NDL SPNL 18GX3.5 QUINCKE PK (NEEDLE) ×1 IMPLANT
NDL SUT .5 MAYO 1.404X.05X (NEEDLE) ×1 IMPLANT
NEEDLE 22X1 1/2 (OR ONLY) (NEEDLE) ×2 IMPLANT
NEEDLE MAYO TAPER (NEEDLE)
NEEDLE SPNL 18GX3.5 QUINCKE PK (NEEDLE) ×2 IMPLANT
NS IRRIG 1000ML POUR BTL (IV SOLUTION) ×2 IMPLANT
PACK LAMINECTOMY ORTHO (CUSTOM PROCEDURE TRAY) ×2 IMPLANT
PAD ARMBOARD 7.5X6 YLW CONV (MISCELLANEOUS) ×4 IMPLANT
PATTIES SURGICAL .5 X.5 (GAUZE/BANDAGES/DRESSINGS) ×1 IMPLANT
PATTIES SURGICAL .75X.75 (GAUZE/BANDAGES/DRESSINGS) IMPLANT
SPONGE GAUZE 4X4 12PLY (GAUZE/BANDAGES/DRESSINGS) ×1 IMPLANT
SUT VIC AB 0 CT1 27 (SUTURE) ×2
SUT VIC AB 0 CT1 27XBRD ANBCTR (SUTURE) IMPLANT
SUT VIC AB 1 CT1 27 (SUTURE) ×2
SUT VIC AB 1 CT1 27XBRD ANBCTR (SUTURE) IMPLANT
SUT VIC AB 2-0 CT1 27 (SUTURE) ×2
SUT VIC AB 2-0 CT1 TAPERPNT 27 (SUTURE) ×1 IMPLANT
SUT VICRYL 0 TIES 12 18 (SUTURE) ×1 IMPLANT
SUT VICRYL 4-0 PS2 18IN ABS (SUTURE) ×1 IMPLANT
SUT VICRYL AB 2 0 TIES (SUTURE) ×1 IMPLANT
SYR 20ML ECCENTRIC (SYRINGE) IMPLANT
SYR CONTROL 10ML LL (SYRINGE) ×2 IMPLANT
TOWEL OR 17X24 6PK STRL BLUE (TOWEL DISPOSABLE) ×2 IMPLANT
TOWEL OR 17X26 10 PK STRL BLUE (TOWEL DISPOSABLE) ×2 IMPLANT
WATER STERILE IRR 1000ML POUR (IV SOLUTION) ×1 IMPLANT

## 2013-07-20 NOTE — Interval H&P Note (Signed)
History and Physical Interval Note:  07/20/2013 12:35 PM  Melanie [name]  has presented today for surgery, with the diagnosis of L2-3 Stenosis  The various methods of treatment have been discussed with the patient and family. After consideration of risks, benefits and other options for treatment, the patient has consented to  Procedure(s) with comments: MICRODISCECTOMY LUMBAR LAMINECTOMY (N/A) - L2-3 Decompression as a surgical intervention .  The patient's history has been reviewed, patient examined, no change in status, stable for surgery.  I have reviewed the patient's chart and labs.  Questions were answered to the patient's satisfaction.     [name]

## 2013-07-20 NOTE — Preoperative (Signed)
Beta Blockers   Reason not to administer Beta Blockers:Not Applicable 

## 2013-07-20 NOTE — Anesthesia Preprocedure Evaluation (Addendum)
Anesthesia Evaluation  Patient identified by MRN, date of birth, ID band Patient awake    Reviewed: Allergy & Precautions, H&P , NPO status , Patient's Chart, lab work & pertinent test results  History of Anesthesia Complications Negative for: history of anesthetic complications  Airway Mallampati: II TM Distance: >3 FB Neck ROM: Full    Dental  (+) Teeth Intact and Dental Advisory Given   Pulmonary former smoker,          Cardiovascular hypertension, Pt. on medications + dysrhythmias Atrial Fibrillation     Neuro/Psych    GI/Hepatic Neg liver ROS, GERD-  Medicated and Controlled,  Endo/Other  diabetes, Well Controlled, Oral Hypoglycemic Agents  Renal/GU negative Renal ROS     Musculoskeletal negative musculoskeletal ROS (+)   Abdominal   Peds  Hematology negative hematology ROS (+)   Anesthesia Other Findings   Reproductive/Obstetrics                           Anesthesia Physical Anesthesia Plan  ASA: III  Anesthesia Plan: General   Post-op Pain Management:    Induction: Intravenous  Airway Management Planned: Oral ETT  Additional Equipment:   Intra-op Plan:   Post-operative Plan:   Informed Consent: I have reviewed the patients History and Physical, chart, labs and discussed the procedure including the risks, benefits and alternatives for the proposed anesthesia with the patient or authorized representative who has indicated his/her understanding and acceptance.   Dental advisory given  Plan Discussed with: CRNA and Anesthesiologist  Anesthesia Plan Comments:         Anesthesia Quick Evaluation

## 2013-07-20 NOTE — Brief Op Note (Cosign Needed)
07/20/2013  2:31 PM  PATIENT:  Melanie [name]  67 y.o. female  PRE-OPERATIVE DIAGNOSIS:  L2-3 Stenosis  POST-OPERATIVE DIAGNOSIS:  L2-3 Stenosis  PROCEDURE:  Procedure(s) with comments: MICRODISCECTOMY LUMBAR LAMINECTOMY (N/A) - L2-3 Decompression Central and lateral recess decompression at L2-3  SURGEON:  Surgeon(s) and Role:    * Eldred Manges, MD - Primary  PHYSICIAN ASSISTANT: Maud Deed [facility]  ASSISTANTS: none   ANESTHESIA:   general  EBL:  Total I/O In: 1850 [I.V.:1600; IV Piggyback:250] Out: 200 [Blood:200]  BLOOD ADMINISTERED:none  DRAINS: none   LOCAL MEDICATIONS USED:  NONE  SPECIMEN:  No Specimen  DISPOSITION OF SPECIMEN:  N/A  COUNTS:  YES  TOURNIQUET:  * No tourniquets in log *  DICTATION: .Note written in EPIC  PLAN OF CARE: Admit for overnight observation  PATIENT DISPOSITION:  PACU - hemodynamically stable.   Delay start of Pharmacological VTE agent (>24hrs) due to surgical blood loss or risk of bleeding: yes

## 2013-07-20 NOTE — Transfer of Care (Signed)
Immediate Anesthesia Transfer of Care Note  Patient: Melanie [name]  Procedure(s) Performed: Procedure(s) with comments: MICRODISCECTOMY LUMBAR LAMINECTOMY (N/A) - L2-3 Decompression  Patient Location: PACU  Anesthesia Type:General  Level of Consciousness: awake, alert , oriented and sedated  Airway & Oxygen Therapy: Patient Spontanous Breathing and Patient connected to nasal cannula oxygen  Post-op Assessment: Report given to PACU RN, Post -op Vital signs reviewed and stable and Patient moving all extremities  Post vital signs: Reviewed and stable  Complications: No apparent anesthesia complications

## 2013-07-20 NOTE — Anesthesia Postprocedure Evaluation (Signed)
  Anesthesia Post-op Note  Patient: Melanie [name]  Procedure(s) Performed: Procedure(s) with comments: MICRODISCECTOMY LUMBAR LAMINECTOMY (N/A) - L2-3 Decompression  Patient Location: PACU  Anesthesia Type:General  Level of Consciousness: awake  Airway and Oxygen Therapy: Patient Spontanous Breathing  Post-op Pain: mild  Post-op Assessment: Post-op Vital signs reviewed  Post-op Vital Signs: Reviewed  Complications: No apparent anesthesia complications

## 2013-07-21 LAB — GLUCOSE, CAPILLARY
Glucose-Capillary: 154 mg/dL — ABNORMAL HIGH (ref 70–99)
Glucose-Capillary: 168 mg/dL — ABNORMAL HIGH (ref 70–99)

## 2013-07-21 MED ORDER — PANTOPRAZOLE SODIUM 40 MG PO TBEC: 40.0000 mg | DELAYED_RELEASE_TABLET | Freq: Every day | ORAL | Status: DC

## 2013-07-21 NOTE — Op Note (Signed)
Melanie [name], Melanie [name]             ACCOUNT NO.:  [id number]  MEDICAL RECORD NO.:  [id number]  LOCATION:  5N27C                        FACILITY:  [facility]  PHYSICIAN:  Ximena Todaro C. Ophelia Charter, M.D.    DATE OF BIRTH:  1946/05/07  DATE OF PROCEDURE:  07/20/2013 DATE OF DISCHARGE:                              OPERATIVE REPORT   PREOPERATIVE DIAGNOSES:  L2-3 stenosis, retrolisthesis lateral recess stenosis, and disk protrusion, right.  POSTOPERATIVE DIAGNOSES:  L2-3 stenosis, retrolisthesis lateral recess stenosis, and disk protrusion, right.  PROCEDURE:  L2-3 decompression, right, L2-3 microdiskectomy.  Lateral recess decompression.  SURGEON:  Azadeh Hyder C. Ophelia Charter, MD  ASSISTANT:  Maud Deed, PA-C, medically necessary and present for the entire procedure.  ESTIMATED BLOOD LOSS:  Minimal.  ANESTHESIA:  General plus Marcaine local.  This 67 year old female who has had persistent problems with back pain, some buttock pain, anterior thigh pain with failure of conservative treatment, Medrol Dosepak, anti-inflammatories, epidural steroid injections, physical therapy, with progressive symptoms over 4 months where the patient is having difficulty ambulating with pain with prolonged standing and leg giving way, but has not actually fallen.  MRI scan showed moderate stenosis at the 2-3 level.  Melanie [name] had previous decompression many years ago at 4-5 level and did well with good relief of her neurogenic claudication symptoms.  DESCRIPTION OF PROCEDURE:  After induction of general anesthesia, orotracheal intubation, the patient was placed in the prone position with chest rolls, yellow roll pads underneath the shoulders, ulnar nerve.  10/10 drape was applied, and back was prepped with DuraPrep. Area was squared with towels.  Old incision, of 4/5 was noted and Betadine, Steri-Drape applied, laminectomy sheets and draped spinal needle placed at the expected 2-3 level, cross-table lateral x-ray was obtained.   This showed that started in appropriate position but needed to be angled slightly cephalad since it was on the patient's up slope and Melanie [name] had some increased lordosis in the prone position.  Once starting position was confirmed, incision was made subperiosteal dissection down to the lamina.  Hemostasis was obtained to several bleeders and the muscle controlled with Bovie electrocautery.  Self- retaining McCullough retractor was placed, and then Kocher clamps were placed, checked under x-ray and then adjusted initially the Kocher clamp was high, was angled appropriately confirmed and centered at the level of planned decompression.  The bone was immediately marked and resection of the lamina at L2, slight top portion of L3 thinning the lamina with a Kerrison bringing the operative microscope bur, thinning of the lamina with 4 mm round bur, and then removal with 3 mm Kerrison using patty to protect the dura.  There was some thick hypertrophic ligamentum present, this was used to protect the dura and about 1/3rd of the lamina was left proximally.  Bone was removed out to the level of the pedicle.  There was very thick chunks of ligament causing lateral recess stenosis.  Once this was decompressed, overhanging spurs, narrowing the shoulder of the nerve root takeoff and the lateral recess was performed within the foramina was enlarged Undercutting over the dorsum of the nerve.  Hockey stick could be passed on the right side out.  All sides and over the shoulder  nerve root #4 Nicholos Johns was used for traction and disk was palpated directly visualized by operative microscope zoomed in and anulus was incised and a Penfield 4 was barely fit to the disk space. There was small amount of material inferiorly extruded that was removed with the micropituitary.  This appeared old, fibrotic, did not seem to have any fresh pieces to it.  Ball-tipped nerve hook was used for passage 180 degrees sweep first on the  right, then on the left side. Additional removal of overhanging spurs on the left, causing lateral recess stenosis, removal of all chunks of ligament with bone at the level the pedicles performed, so the dura was decompressed in a nice round structure.  Hockey stick was able to be passed anterior to the dura with no areas of compression.  Copious irrigation with saline solution.  Final passes checking both walls make sure that there was no remaining chunks of ligament.  Palpation above and below the lateral gutters over the nerve root around the edge of the pedicle was performed.  Once this was satisfactorily decompressed, the entire field was irrigated again and closed with #1 Vicryl in the deep fascia, 2-0 Vicryl subcutaneous tissue, subcuticular closure, Marcaine infiltration, postop dressing, Dermabond, and transferred to recovery room in stable condition.  Instrument count and needle count was correct.     Radin Raptis C. Ophelia Charter, M.D.     MCY/MEDQ  D:  07/20/2013  T:  07/21/2013  Job:  [id number]

## 2013-07-21 NOTE — Progress Notes (Signed)
UR COMPLETED  

## 2013-07-21 NOTE — Progress Notes (Signed)
Subjective: 1 Day Post-Op Procedure(s) (LRB): MICRODISCECTOMY LUMBAR LAMINECTOMY (N/A) Patient reports pain as moderate at incision. When she gets up she can tell leg pain relief vs. Pre-op.  Ambulatory to BR.   Objective: Vital signs in last 24 hours: Temp:  [97.2 F (36.2 C)-98.7 F (37.1 C)] 98.7 F (37.1 C) (11/06 0524) Pulse Rate:  [63-82] 65 (11/06 0524) Resp:  [10-18] 16 (11/06 0524) BP: (98-165)/(44-85) 106/48 mmHg (11/06 0524) SpO2:  [92 %-99 %] 93 % (11/06 0524)  Intake/Output from previous day: 11/05 0701 - 11/06 0700 In: 2240 [P.O.:240; I.V.:1750; IV Piggyback:250] Out: 200 [Blood:200] Intake/Output this shift:    No results found for this basename: HGB,  in the last 72 hours No results found for this basename: WBC, RBC, HCT, PLT,  in the last 72 hours No results found for this basename: NA, K, CL, CO2, BUN, CREATININE, GLUCOSE, CALCIUM,  in the last 72 hours No results found for this basename: LABPT, INR,  in the last 72 hours  Neurologically intact  Assessment/Plan: 1 Day Post-Op Procedure(s) (LRB): MICRODISCECTOMY LUMBAR LAMINECTOMY (N/A) Up with therapy  D/C home.  Melanie [name] C 07/21/2013, 8:02 AM

## 2013-07-21 NOTE — Progress Notes (Signed)
   CARE MANAGEMENT NOTE 07/21/2013  Patient:  Melanie [name], Melanie [name]   Account Number:  [id number]  Date Initiated:  07/21/2013  Documentation initiated by:  [facility]  Subjective/Objective Assessment:   MICRODISCECTOMY LUMBAR LAMINECTOMY     Action/Plan:   no NCM needs identified.   Anticipated DC Date:  07/21/2013   Anticipated DC Plan:  HOME/SELF CARE      DC Planning Services  CM consult      Choice offered to / List presented to:             Status of service:  Completed, signed off Medicare Important Message given?   (If response is "NO", the following Medicare IM given date fields will be blank) Date Medicare IM given:   Date Additional Medicare IM given:    Discharge Disposition:  HOME/SELF CARE  Per UR Regulation:    If discussed at Long Length of Stay Meetings, dates discussed:    Comments:

## 2013-07-22 ENCOUNTER — Encounter (HOSPITAL_COMMUNITY): Payer: Self-pay | Admitting: Orthopaedic Surgery

## 2013-08-03 NOTE — Discharge Summary (Signed)
Physician Discharge Summary  Patient ID: Melanie [name] MRN: [id number] DOB/AGE: 01/31/1946 67 y.o.  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 HTN (hypertension)   . Bradycardia   . DM (diabetes mellitus)   . Situational stress   . PAF (paroxysmal atrial fibrillation)   . Family history of anesthesia complication     mother had problems, but unsure of what they were  . GERD (gastroesophageal reflux disease)   . Arthritis     Surgeries: Procedure(s): MICRODISCECTOMY LUMBAR LAMINECTOMY at  L2-3 on 07/20/2013   Consultants (if any):  none  Discharged Condition: Improved  Hospital Course: Melanie [name] is an 67 y.o. female who was admitted 07/20/2013 with a diagnosis of Spinal stenosis, lumbar region, with neurogenic claudication and went to the operating room on 07/20/2013 and underwent the above named procedures.    She was given perioperative antibiotics:  Anti-infectives   Start     Dose/Rate Route Frequency Ordered Stop   07/20/13 1830  ceFAZolin (ANCEF) IVPB 1 g/50 mL premix     1 g 100 mL/hr over 30 Minutes Intravenous Every 8 hours 07/20/13 1756 07/21/13 0333   07/20/13 0600  ceFAZolin (ANCEF) IVPB 2 g/50 mL premix     2 g 100 mL/hr over 30 Minutes Intravenous On call to O.R. 07/19/13 1423 07/20/13 1303    .  She was given sequential compression devices, early ambulation for DVT prophylaxis.  She benefited maximally from the hospital stay and there were no complications.    Recent vital signs:  Filed Vitals:   07/21/13 0524  BP: 106/48  Pulse: 65  Temp: 98.7 F (37.1 C)  Resp: 16    Recent laboratory studies:  Lab Results  Component Value Date   HGB 12.6 07/15/2013   Lab Results  Component Value  Date   WBC 7.8 07/15/2013   PLT 210 07/15/2013   Lab Results  Component Value Date   INR 0.94 07/15/2013   Lab Results  Component Value Date   NA 135 07/15/2013   K 3.9 07/15/2013   CL 97 07/15/2013   CO2 26 07/15/2013   BUN 13 07/15/2013   CREATININE 0.94 07/15/2013   GLUCOSE 276* 07/15/2013    Discharge Medications:     Medication List    STOP taking these medications       HYDROcodone-acetaminophen 5-325 MG per tablet  Commonly known as:  NORCO/VICODIN      TAKE these medications       aspirin EC 81 MG tablet  Take 81 mg by mouth daily.     bisoprolol-hydrochlorothiazide 5-6.25 MG per tablet  Commonly known as:  ZIAC  Take 1 tablet by mouth 2 (two) times daily.     gabapentin 300 MG capsule  Commonly known as:  NEURONTIN  Take 300 mg by mouth every 8 (eight) hours as needed (for back pain).     lisinopril 20 MG tablet  Commonly known as:  PRINIVIL,ZESTRIL  Take 20 mg by mouth daily.     metFORMIN 500 MG tablet  Commonly known as:  GLUCOPHAGE  Take 500 mg by mouth 2 (two) times daily with a meal.     methocarbamol 500 MG tablet  Commonly known as:  ROBAXIN  Take 1 tablet (500 mg total) by mouth  every 8 (eight) hours as needed for muscle spasms (spasm).     oxyCODONE-acetaminophen 5-325 MG per tablet  Commonly known as:  ROXICET  Take 1-2 tablets by mouth every 4 (four) hours as needed for severe pain.     sertraline 50 MG tablet  Commonly known as:  ZOLOFT  Take 50 mg by mouth daily.        Diagnostic Studies: Dg Chest 2 View  07/15/2013   CLINICAL DATA:  Lumbar spine stenosis. Pre-op respiratory exam.  EXAM: CHEST  2 VIEW  COMPARISON:  04/08/2006  FINDINGS: The heart size and mediastinal contours are within normal limits. Both lungs are clear. Tiny calcified granuloma again seen in lateral left lung base. Mild thoracic spine degenerative changes noted.  IMPRESSION: No active cardiopulmonary disease.   Electronically Signed   By: Myles Rosenthal M.D.    On: 07/15/2013 15:16   Dg Lumbar Spine 2-3 Views  07/20/2013   CLINICAL DATA:  LUmbar decompression at L2-3, localization  EXAM: LUMBAR SPINE - 2-3 VIEW  COMPARISON:  Lumbar spine films of 06/14/2013  FINDINGS: A needle was positioned posteriorly between the spinous processes of L2-3 for localization of the L2-3 interspace. A 2nd film shows retractors posteriorly at the L2-3 level.  IMPRESSION: Localization of L2-3.   Electronically Signed   By: Dwyane Dee M.D.   On: 07/20/2013 16:14    Disposition: 01-Home or Self Care  Discharge instructions: No lifting greater than 10 lbs. Avoid bending, stooping and twisting. Walk in house for first week them may start to get out slowly increasing distance up to one mile by 3 weeks post op. Keep incision dry for 3 days, may use tegaderm or similar water impervious dressing.        Follow-up Information   Follow up with Eldred Manges, MD. Schedule an appointment as soon as possible for a visit in 2 weeks.   Specialty:  Orthopedic Surgery   Contact information:   [address] [place] [address] [phone]        Signed: Wende Neighbors 08/03/2013, 4:04 PM

## 2013-08-16 DIAGNOSIS — M545 Low back pain: Secondary | ICD-10-CM | POA: Diagnosis not present

## 2013-08-16 DIAGNOSIS — M48061 Spinal stenosis, lumbar region without neurogenic claudication: Secondary | ICD-10-CM | POA: Diagnosis not present

## 2013-09-11 DIAGNOSIS — M25569 Pain in unspecified knee: Secondary | ICD-10-CM | POA: Diagnosis not present

## 2013-09-13 DIAGNOSIS — E289 Ovarian dysfunction, unspecified: Secondary | ICD-10-CM | POA: Diagnosis not present

## 2013-09-13 DIAGNOSIS — E538 Deficiency of other specified B group vitamins: Secondary | ICD-10-CM | POA: Diagnosis not present

## 2013-09-13 DIAGNOSIS — Z6832 Body mass index (BMI) 32.0-32.9, adult: Secondary | ICD-10-CM | POA: Diagnosis not present

## 2013-09-13 DIAGNOSIS — G609 Hereditary and idiopathic neuropathy, unspecified: Secondary | ICD-10-CM | POA: Diagnosis not present

## 2013-09-13 DIAGNOSIS — E119 Type 2 diabetes mellitus without complications: Secondary | ICD-10-CM | POA: Diagnosis not present

## 2013-09-16 DIAGNOSIS — G609 Hereditary and idiopathic neuropathy, unspecified: Secondary | ICD-10-CM | POA: Diagnosis not present

## 2013-09-16 DIAGNOSIS — H409 Unspecified glaucoma: Secondary | ICD-10-CM | POA: Diagnosis not present

## 2013-09-16 DIAGNOSIS — E538 Deficiency of other specified B group vitamins: Secondary | ICD-10-CM | POA: Diagnosis not present

## 2013-09-22 ENCOUNTER — Encounter: Payer: Self-pay | Admitting: Neurology

## 2013-09-22 ENCOUNTER — Ambulatory Visit (INDEPENDENT_AMBULATORY_CARE_PROVIDER_SITE_OTHER): Payer: Medicare Other | Admitting: Neurology

## 2013-09-22 VITALS — BP 128/60 | HR 84 | Resp 25 | Ht 67.0 in | Wt 194.3 lb

## 2013-09-22 DIAGNOSIS — E1142 Type 2 diabetes mellitus with diabetic polyneuropathy: Secondary | ICD-10-CM

## 2013-09-22 DIAGNOSIS — E1140 Type 2 diabetes mellitus with diabetic neuropathy, unspecified: Secondary | ICD-10-CM

## 2013-09-22 DIAGNOSIS — E1149 Type 2 diabetes mellitus with other diabetic neurological complication: Secondary | ICD-10-CM | POA: Diagnosis not present

## 2013-09-22 LAB — VITAMIN B12: Vitamin B-12: 208 pg/mL — ABNORMAL LOW (ref 211–911)

## 2013-09-22 LAB — TSH: TSH: 0.4 u[IU]/mL (ref 0.35–5.50)

## 2013-09-22 NOTE — Progress Notes (Signed)
[facility] Clinic Note - Initial Visit   Date: 09/22/2013    Melanie [name] MRN: [id number] DOB: [birth year]   Dear Dr Melanie [name]:  Thank you for your kind referral of Melanie [name] for consultation of sensory changes of the feet. Although her history is well known to you, please allow [place] to reiterate it for the purpose of our medical record. The patient was accompanied to the clinic by self.    History of Present Illness: Melanie [name] is a 68 y.o. right-handed Caucasian female with history of hypertension, bradycardia, diabetes mellitus (HbA1c 7.4), paroxysmal atrial fibrillation, GERD, and L2-L3 disc protrusion s/p decompression (07/2013) presenting for evaluation of neuropathy.  She is unsure of when symptoms started because in Spring of 2014, she developed sharp radiating pain down her low back and legs and was found to have L2-3 disc protrusionc.  She underwent L2-L3 decompression in November 2014 which resolved her radiating back pain, but she has noticed numbness and tingling/stabbing pain over bilateral feet. When she severe, she feels as if there is "sand paper" on her feet. Symptoms are constant, worse in the evening. She was taking vicodin for her back pain which helped her feet symptoms.  Prolonged standing and tight shoes exacerbates pain.  Numbness and tingling is localized to her feet and does not involve her lower leg.  She takes neurontin 600mg  TID without significant benefit, but has been on this dose for 2 weeks.  Of note, she complains of numbness over the right lateral surface of the thigh which has been there since she was pregnant with her son.  Her mother and sisters have neuropathy, but no history of neuropathy.  They were all ambulatory, except one of her sisters who had MRSA infection of her leg and underwent surgery.   Out-side paper records, electronic medical record, and images have been reviewed where available and summarized  as:   HbA1c 7.3 in 05/2013  MRI lumbar spine 03/15/2013:  1. Lumbar spondylosis and degenerative disc disease cause moderate impingement at L2-3 and L4-5, and mild impingement at L3-4 as detailed above.  2. 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 I doubt that this is a pheochromocytoma given the intermediate T2 signal characteristics, but otherwise this mass is nonspecific (although statistically most likely to represent an adenoma). If the patient has a history of lung cancer or if further workup is warranted, MRI of the adrenal glands with and without contrast provides the greatest diagnostic specificity.  3. Atrophic left kidney.   Past Medical History  Diagnosis Date  . HTN (hypertension)   . Bradycardia   . DM (diabetes mellitus)   . Situational stress   . PAF (paroxysmal atrial fibrillation)   . Family history of anesthesia complication     mother had problems, but unsure of what they were  . GERD (gastroesophageal reflux disease)   . Arthritis     Past Surgical History  Procedure Laterality Date  . Abdominal hysterectomy    . Knee arthroscopy Bilateral 2007    aug and oct of 2007  . Carpal tunnel release Bilateral 2003  . Back surgery  2004  . Appendectomy    . Abdominal hysterectomy    . Joint replacement      both knees  . Colonoscopy    . Lumbar laminectomy N/A 07/20/2013    Procedure: MICRODISCECTOMY LUMBAR LAMINECTOMY;  Surgeon: [name], MD;  Location: [facility];  Service: Orthopedics;  Laterality: N/A;  L2-3 Decompression     Medications:  Current Outpatient Prescriptions on File Prior to Visit  Medication Sig Dispense Refill  . aspirin EC 81 MG tablet Take 81 mg by mouth daily.      . bisoprolol-hydrochlorothiazide (ZIAC) 5-6.25 MG per tablet Take 1 tablet by mouth 2 (two) times daily.      Marland Kitchen gabapentin (NEURONTIN) 300 MG capsule Take 300 mg by mouth every 8 (eight) hours as needed (for back pain).      Marland Kitchen lisinopril (PRINIVIL,ZESTRIL) 20 MG  tablet Take 20 mg by mouth daily.      . metFORMIN (GLUCOPHAGE) 500 MG tablet Take 500 mg by mouth 2 (two) times daily with a meal.      . sertraline (ZOLOFT) 50 MG tablet Take 50 mg by mouth daily.       No current facility-administered medications on file prior to visit.    Allergies:  Allergies  Allergen Reactions  . Metoprolol Other (See Comments)    REACTION: Headache    Family History: Family History  Problem Relation Age of Onset  . Heart attack Father     Deceased, 29  . Other Mother     Deceased, 25  . Neuropathy Mother   . Hypertension Mother   . Ovarian cancer Sister     Deceased, 33  . Neuropathy Sister   . Multiple sclerosis Sister     Social History: History   Social History  . Marital Status: Married    Spouse Name: N/A    Number of Children: N/A  . Years of Education: N/A   Occupational History  . Not on file.   Social History Main Topics  . Smoking status: Current Every Day Smoker -- 0.50 packs/day for 30 years    Last Attempt to Quit: 07/17/2011  . Smokeless tobacco: Not on file  . Alcohol Use: No  . Drug Use: No  . Sexual Activity: Not Currently   Other Topics Concern  . Not on file   Social History Narrative   Lives with husband. They have two grown sons.   Retired from working in [employer].          Review of Systems:  CONSTITUTIONAL: No fevers, chills, night sweats, or weight loss.   EYES: No visual changes or eye pain ENT: No hearing changes.  No history of nose bleeds.   RESPIRATORY: No cough, wheezing and shortness of breath.   CARDIOVASCULAR: Negative for chest pain, and palpitations.   GI: Negative for abdominal discomfort, blood in stools or black stools.  No recent change in bowel habits.   GU:  No history of incontinence.   MUSCLOSKELETAL: No history of joint pain or swelling.  No myalgias.   SKIN: Negative for lesions, rash, and itching.   HEMATOLOGY/ONCOLOGY: Negative for prolonged bleeding, bruising easily, and  swollen nodes.    ENDOCRINE: Negative for cold or heat intolerance, polydipsia or goiter.   PSYCH:  No depression or anxiety symptoms.   NEURO: As Above.   Vital Signs:  BP 128/60  [name] 84  Resp 25  Ht 5\' 7"  (1.702 m)  Wt 194 lb 5 oz (88.14 kg)  BMI 30.43 kg/m2   General Medical Exam:   General:  Well appearing, comfortable.   Eyes/ENT: see cranial nerve examination.   Neck: No masses appreciated.  Full range of motion without tenderness.   Respiratory:  Clear to auscultation, good air entry bilaterally.   Cardiac:  Regular rate and  rhythm, no murmur.   Extremities:  No deformities or hammer toes), edema, or skin discoloration. Good capillary refill.   Skin:  Skin color, texture, turgor normal. No rashes or lesions. Scars over bilateral knees from prior knee replacement  Neurological Exam: MENTAL STATUS including orientation to time, place, person, recent and remote memory, attention span and concentration, language, and fund of knowledge is normal.  Speech is not dysarthric.  CRANIAL NERVES: II:  No visual field defects.  Unremarkable fundi.   III-IV-VI: Pupils equal round and reactive to light.  Normal conjugate, extra-ocular eye movements in all directions of gaze.  No nystagmus.  No ptosis.   V:  Normal facial sensation.    VII:  Normal facial symmetry and movements.   VIII:  Normal hearing and vestibular function.   IX-X:  Normal palatal movement.   XI:  Normal shoulder shrug and head rotation.   XII:  Normal tongue strength and range of motion, no deviation or fasciculation.  MOTOR:  No atrophy, fasciculations or abnormal movements.  No pronator drift.  Tone is normal.    Right Upper Extremity:    Left Upper Extremity:    Deltoid  5/5   Deltoid  5/5   Biceps  5/5   Biceps  5/5   Triceps  5/5   Triceps  5/5   Wrist extensors  5/5   Wrist extensors  5/5   Wrist flexors  5/5   Wrist flexors  5/5   Finger extensors  5/5   Finger extensors  5/5   Finger flexors  5/5    Finger flexors  5/5   Dorsal interossei  5/5   Dorsal interossei  5/5   Abductor pollicis  5/5   Abductor pollicis  5/5   Tone (Ashworth scale)  0  Tone (Ashworth scale)  0   Right Lower Extremity:    Left Lower Extremity:    Hip flexors  5/5   Hip flexors  5/5   Hip extensors  5/5   Hip extensors  5/5   Knee flexors  5/5   Knee flexors  5/5   Knee extensors  5/5   Knee extensors  5/5   Dorsiflexors  5/5   Dorsiflexors  5/5   Plantarflexors  5/5   Plantarflexors  5/5   Toe extensors  5/5   Toe extensors  5/5   Toe flexors  5/5   Toe flexors  5/5   Tone (Ashworth scale)  0  Tone (Ashworth scale)  0   MSRs:  Right                                                                 Left brachioradialis 2+  brachioradialis 2+  biceps 2+  biceps 2+  triceps 2+  triceps 2+  patellar 1+  patellar 1+  ankle jerk 1+  ankle jerk 1+  Hoffman no  Hoffman no  plantar response down  plantar response down    SENSORY:  Vibration is diminished to 50% at her ankles and absent at the great toe bilaterally. Pinprick and temperature is reduced to 50% over the dorsum of her feet.  Proprioception at the great toe is intact. Sensation to all modalities is intact in the upper extremities. Romberg's sign absent.  COORDINATION/GAIT: Normal finger-to- nose-finger and heel-to-shin.  Intact rapid alternating movements bilaterally.  Able to rise from a chair without using arms.  Gait narrow based and stable. She is able to perform stressed gait without difficulty however is unsteady with tandem gait.    IMPRESSION: Mrs. Dardis is a 68 year-old female presenting for evaluation of paresthesias of her feet. Her neurological examination is consistent with a distal and symmetric peripheral neuropathy. She has a family history of neuropathy in her mother and sister who developed it later in life but did not have diabetes. My suspicion for hereditary neuropathy is low, since these manifest earlier. I discussed that for  definitive diagnosis, a NCS/EMG can be performed.  She does not seem very interested in pursuing this test, which is reasonable given that her history and exam is consistent with a peripheral neuropathy. If her symptoms change or worsen in an atypical pattern, I would recommend testing. In the meantime, I will follow her clinically and treat her painful dysesthesias.  Underlying etiology is most likely diabetes, however I will check for other treatable causes of neuropathy. She is currently taking Neurontin 600 mg 3 times daily without significant benefit. I would like to further titrate this to 900 mg 3 times daily, but if there is no improvement, I will switch her to Lyrica.  I had extensive discussion regarding the couple physiology, etiologies, natural course, and management options. She was given opportunity to ask questions and I answered them to the best of my ability.     PLAN/RECOMMENDATIONS:  1.  Check vitamin B12, TSH, copper, SPEP/UPEP with IFE 2.  Titrate neurontin to 900mg  TID (titration schedule provided) 3.  Strongly encouraged tobacco cessation 4.  Return to clinic in 2-weeks, if there is no improvement with higher dose of neurontin, will switch her to Lyrica   The duration of this appointment visit was  minutes of face-to-face time with the patient.  Greater than 50% of this time was spent in counseling, explanation of diagnosis, planning of further management, and coordination of care.   Thank you for allowing me to participate in patient's care.  If I can answer any additional questions, I would be pleased to do so.    Sincerely,    Sweet Jarvis K. Posey Pronto, DO

## 2013-09-22 NOTE — Progress Notes (Signed)
faxed

## 2013-09-22 NOTE — Patient Instructions (Signed)
1.  Start taking neurontin as follows:    AM Noon PM  Day 1-3: 600 600 900mg  (1.5 tab)  Day 4-6: 600 900 900mg   Thereafter 900 900 900mg  (1.5 tab three times daily) 2.  Check blood work today 3.  Strongly encouraged tobacco cessation 4.  Return to clinic in 2-weeks

## 2013-09-24 LAB — COPPER, SERUM: Copper: 85 ug/dL (ref 70–175)

## 2013-09-26 LAB — UIFE/LIGHT CHAINS/TP QN, 24-HR UR
Albumin, U: DETECTED
Alpha 1, Urine: DETECTED — AB
Alpha 2, Urine: DETECTED — AB
Beta, Urine: DETECTED — AB
Free Kappa Lt Chains,Ur: 16 mg/dL — ABNORMAL HIGH (ref 0.14–2.42)
Free Kappa/Lambda Ratio: 8.29 ratio (ref 2.04–10.37)
Free Lambda Lt Chains,Ur: 1.93 mg/dL — ABNORMAL HIGH (ref 0.02–0.67)
Gamma Globulin, Urine: DETECTED — AB
Total Protein, Urine: 19.3 mg/dL

## 2013-09-26 LAB — PROTEIN ELECTROPHORESIS, SERUM
Albumin ELP: 61.6 % (ref 55.8–66.1)
Alpha-1-Globulin: 3.4 % (ref 2.9–4.9)
Alpha-2-Globulin: 11.5 % (ref 7.1–11.8)
Beta 2: 4.2 % (ref 3.2–6.5)
Beta Globulin: 7.1 % (ref 4.7–7.2)
Gamma Globulin: 12.2 % (ref 11.1–18.8)
Total Protein, Serum Electrophoresis: 7.4 g/dL (ref 6.0–8.3)

## 2013-09-27 ENCOUNTER — Encounter: Payer: Self-pay | Admitting: Neurology

## 2013-09-27 ENCOUNTER — Ambulatory Visit (INDEPENDENT_AMBULATORY_CARE_PROVIDER_SITE_OTHER): Payer: Medicare Other | Admitting: Neurology

## 2013-09-27 DIAGNOSIS — E538 Deficiency of other specified B group vitamins: Secondary | ICD-10-CM | POA: Diagnosis not present

## 2013-09-27 MED ORDER — CYANOCOBALAMIN 1000 MCG/ML IJ SOLN
1000.0000 ug | Freq: Once | INTRAMUSCULAR | Status: AC
  Administered 2013-09-27: 1000 ug via INTRAMUSCULAR

## 2013-09-27 NOTE — Progress Notes (Signed)
Patient in for a b12 injection in right arm IM. No reaction patient will return tomarrow for her next injection

## 2013-09-28 ENCOUNTER — Ambulatory Visit: Payer: Medicare Other

## 2013-09-29 ENCOUNTER — Ambulatory Visit: Payer: Medicare Other

## 2013-09-29 DIAGNOSIS — E538 Deficiency of other specified B group vitamins: Secondary | ICD-10-CM

## 2013-09-29 MED ORDER — CYANOCOBALAMIN 1000 MCG/ML IJ SOLN
1000.0000 ug | Freq: Once | INTRAMUSCULAR | Status: AC
  Administered 2013-09-29: 1000 ug via INTRAMUSCULAR

## 2013-09-30 ENCOUNTER — Ambulatory Visit: Payer: Medicare Other

## 2013-10-02 DIAGNOSIS — J019 Acute sinusitis, unspecified: Secondary | ICD-10-CM | POA: Diagnosis not present

## 2013-10-03 ENCOUNTER — Ambulatory Visit (INDEPENDENT_AMBULATORY_CARE_PROVIDER_SITE_OTHER): Payer: Medicare Other | Admitting: Neurology

## 2013-10-03 ENCOUNTER — Encounter: Payer: Self-pay | Admitting: Neurology

## 2013-10-03 DIAGNOSIS — E538 Deficiency of other specified B group vitamins: Secondary | ICD-10-CM

## 2013-10-03 MED ORDER — CYANOCOBALAMIN 1000 MCG/ML IJ SOLN: 1000.0000 ug | Freq: Once | INTRAMUSCULAR | Status: DC

## 2013-10-03 MED ORDER — CYANOCOBALAMIN 1000 MCG/ML IJ SOLN
1000.0000 ug | Freq: Once | INTRAMUSCULAR | Status: AC
  Administered 2013-10-03: 1000 ug via INTRAMUSCULAR

## 2013-10-03 NOTE — Progress Notes (Signed)
Patient in for b-12 injection 

## 2013-10-04 ENCOUNTER — Ambulatory Visit (INDEPENDENT_AMBULATORY_CARE_PROVIDER_SITE_OTHER): Payer: Medicare Other | Admitting: Neurology

## 2013-10-04 ENCOUNTER — Encounter: Payer: Self-pay | Admitting: Neurology

## 2013-10-04 DIAGNOSIS — E538 Deficiency of other specified B group vitamins: Secondary | ICD-10-CM

## 2013-10-04 DIAGNOSIS — I10 Essential (primary) hypertension: Secondary | ICD-10-CM | POA: Diagnosis not present

## 2013-10-04 DIAGNOSIS — E781 Pure hyperglyceridemia: Secondary | ICD-10-CM | POA: Diagnosis not present

## 2013-10-04 DIAGNOSIS — E669 Obesity, unspecified: Secondary | ICD-10-CM | POA: Diagnosis not present

## 2013-10-04 DIAGNOSIS — E785 Hyperlipidemia, unspecified: Secondary | ICD-10-CM | POA: Diagnosis not present

## 2013-10-04 DIAGNOSIS — E1149 Type 2 diabetes mellitus with other diabetic neurological complication: Secondary | ICD-10-CM | POA: Diagnosis not present

## 2013-10-04 DIAGNOSIS — J209 Acute bronchitis, unspecified: Secondary | ICD-10-CM | POA: Diagnosis not present

## 2013-10-04 DIAGNOSIS — G609 Hereditary and idiopathic neuropathy, unspecified: Secondary | ICD-10-CM | POA: Diagnosis not present

## 2013-10-04 MED ORDER — CYANOCOBALAMIN 1000 MCG/ML IJ SOLN
1000.0000 ug | Freq: Once | INTRAMUSCULAR | Status: AC
  Administered 2013-10-04: 1000 ug via INTRAMUSCULAR

## 2013-10-04 NOTE — Progress Notes (Signed)
Patient in today for b12 injection to right arm

## 2013-10-05 ENCOUNTER — Encounter: Payer: Self-pay | Admitting: Neurology

## 2013-10-05 ENCOUNTER — Ambulatory Visit (INDEPENDENT_AMBULATORY_CARE_PROVIDER_SITE_OTHER): Payer: Medicare Other | Admitting: Neurology

## 2013-10-05 DIAGNOSIS — E538 Deficiency of other specified B group vitamins: Secondary | ICD-10-CM | POA: Diagnosis not present

## 2013-10-05 MED ORDER — CYANOCOBALAMIN 1000 MCG/ML IJ SOLN
1000.0000 ug | Freq: Once | INTRAMUSCULAR | Status: AC
  Administered 2013-10-05: 1000 ug via INTRAMUSCULAR

## 2013-10-05 NOTE — Progress Notes (Signed)
Patient in today for a b12 injection

## 2013-10-06 ENCOUNTER — Ambulatory Visit: Payer: Medicare Other

## 2013-10-06 ENCOUNTER — Ambulatory Visit (INDEPENDENT_AMBULATORY_CARE_PROVIDER_SITE_OTHER): Payer: Medicare Other | Admitting: Neurology

## 2013-10-06 ENCOUNTER — Encounter: Payer: Self-pay | Admitting: Neurology

## 2013-10-06 VITALS — BP 118/60 | HR 74 | Temp 98.2°F | Ht 67.0 in | Wt 192.0 lb

## 2013-10-06 DIAGNOSIS — E1142 Type 2 diabetes mellitus with diabetic polyneuropathy: Secondary | ICD-10-CM

## 2013-10-06 DIAGNOSIS — E538 Deficiency of other specified B group vitamins: Secondary | ICD-10-CM

## 2013-10-06 DIAGNOSIS — E1149 Type 2 diabetes mellitus with other diabetic neurological complication: Secondary | ICD-10-CM

## 2013-10-06 DIAGNOSIS — E1140 Type 2 diabetes mellitus with diabetic neuropathy, unspecified: Secondary | ICD-10-CM

## 2013-10-06 MED ORDER — CYANOCOBALAMIN 1000 MCG/ML IJ SOLN
1000.0000 ug | Freq: Once | INTRAMUSCULAR | Status: AC
  Administered 2013-10-06: 1000 ug via INTRAMUSCULAR

## 2013-10-06 NOTE — Patient Instructions (Signed)
1.  Continue neurontin 900mg  three times daily 2.  Continue vitamin B12 1051mcg IM injections weekly x 4 weeks, then monthly thereafter 3.  Return to clinic in 80-months

## 2013-10-06 NOTE — Progress Notes (Signed)
Follow-up Visit   Date: 10/06/2013    Melanie [name] MRN: [id number] DOB: 05-Mar-1946   Interim History: Melanie [name] is a 68 y.o. right-handed Caucasian female with history of hypertension, bradycardia, diabetes mellitus (HbA1c 7.4), paroxysmal atrial fibrillation, GERD, and L2-L3 disc protrusion s/p decompression (07/2013) returning to the clinic for follow-up of diabetic neuropathy.  The patient was accompanied to the clinic by self.  She was last seen on 09/22/2013.  History of present illness: She is unsure of when symptoms started because in Spring of 2014, she developed sharp radiating pain down her low back and legs and was found to have L2-3 disc protrusionc. She underwent L2-L3 decompression in November 2014 which resolved her radiating back pain, but she has noticed numbness and tingling/stabbing pain over bilateral feet. When she severe, she feels as if there is "sand paper" on her feet. Symptoms are constant, worse in the evening. She was taking vicodin for her back pain which helped her feet symptoms. Prolonged standing and tight shoes exacerbates pain. Numbness and tingling is localized to her feet and does not involve her lower leg. Of note, she complains of numbness over the right lateral surface of the thigh which has been there since she was pregnant with her son.    Follow-up 10/06/2013:   After increasing her neurontin to 900mg  TID, she noticed an improvement in her painful paresthesias where it was nearly resolved.  Overall, pain reduction is about 40-50%.  She continues to have intermittent symptoms that her feet are on sand paper.  She was started on vitamin B12 injections for B12 deficiency and does not report significant change in symptoms as of yet.  She also saw Dr. Brigitte Pulse for diabetes medication adjustment (metformin increased to 1000mg  BID and glipizide 5mg  BID was added) but reports having hypoglycemic episode yesterday.  She did not eat very much yesterday  during the day.  Today, she is feeling well.  She fell last week on ice but did not sustain significant injuries.   Medications:  Current Outpatient Prescriptions on File Prior to Visit  Medication Sig Dispense Refill  . aspirin EC 81 MG tablet Take 81 mg by mouth daily.      . bisoprolol-hydrochlorothiazide (ZIAC) 5-6.25 MG per tablet Take 1 tablet by mouth 2 (two) times daily.      Marland Kitchen gabapentin (NEURONTIN) 300 MG capsule Take 300 mg by mouth every 8 (eight) hours as needed (for back pain).      Marland Kitchen lisinopril (PRINIVIL,ZESTRIL) 20 MG tablet Take 20 mg by mouth daily.      . metFORMIN (GLUCOPHAGE) 500 MG tablet Take 500 mg by mouth 2 (two) times daily with a meal.      . Misc Natural Products (OSTEO BI-FLEX ADV TRIPLE ST PO) Take by mouth daily.      . sertraline (ZOLOFT) 50 MG tablet Take 50 mg by mouth daily.       No current facility-administered medications on file prior to visit.    Allergies:  Allergies  Allergen Reactions  . Metoprolol Other (See Comments)    REACTION: Headache    Review of Systems:  CONSTITUTIONAL: No fevers, chills, night sweats, or weight loss.   EYES: No visual changes or eye pain ENT: No hearing changes.  No history of nose bleeds.   RESPIRATORY: No cough, wheezing and shortness of breath.   CARDIOVASCULAR: Negative for chest pain, and palpitations.   GI: Negative for abdominal discomfort, blood in stools or black  stools.  No recent change in bowel habits.   GU:  No history of incontinence.   MUSCLOSKELETAL: No history of joint pain or swelling.  No myalgias.   SKIN: Negative for lesions, rash, and itching.   ENDOCRINE: Negative for cold or heat intolerance, polydipsia or goiter.   PSYCH:  No depression or anxiety symptoms.   NEURO: As Above.   Vital Signs:  BP 118/60  Pulse 74  Temp(Src) 98.2 F (36.8 C) (Oral)  Ht 5\' 7"  (1.702 m)  Wt 192 lb (87.091 kg)  BMI 30.06 kg/m2  Neurological Exam: MENTAL STATUS including orientation to time,  place, person, recent and remote memory, attention span and concentration, language, and fund of knowledge is normal.  Speech is not dysarthric.  CRANIAL NERVES:  Pupils equal round and reactive to light.  Normal conjugate, extra-ocular eye movements in all directions of gaze.  Face is symmetric. Palate elevates symmetrically.  Tongue is midline.  MOTOR:  Motor strength is 5/5 in all extremities   MSRs:  Reflexes are 2+/4 in the upper extremities, 1+/4 in the patella and Achilles bilaterally.  SENSORY:  Vibration and pin prick is diminished to 50% at her ankles, intact at the lower legs. Sensation to all modalities is intact in the upper extremities. Romberg's sign positive.   COORDINATION/GAIT:  Normal finger-to- nose-finger and heel-to-shin.  Intact rapid alternating movements bilaterally.  Gait narrow based and stable.   Data: Component     Latest Ref Rng 09/22/2013  Copper     70 - 175 mcg/dL 85  Vitamin B-12     211 - 911 pg/mL 208 (L)  TSH     0.35 - 5.50 uIU/mL 0.40  SPEP/UPEP with IFE:  No M protein  HbA1c 7.3 in 05/2013   MRI lumbar spine 03/15/2013:  1. Lumbar spondylosis and degenerative disc disease cause moderate impingement at L2-3 and L4-5, and mild impingement at L3-4 as detailed above.  2. 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 I doubt that this is a pheochromocytoma given the intermediate T2 signal characteristics, but otherwise this mass is nonspecific (although statistically most likely to represent an adenoma). If the patient has a history of lung cancer or if further workup is warranted, MRI of the adrenal glands with and without contrast provides the greatest diagnostic specificity.  3. Atrophic left kidney.   IMPRESSION/PLAN: 1.  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  Vitamin B12  deficiency, new problem  - Likely also contributing to neuropathy  - Continue vitamin B12 1063mcg IM injections weekly x 4 weeks, then monthly thereafter 3.  Lumbar spondylosis with moderate impingement at L2-3 and L4-5 s/p decompression (November 2014) 4.  Diabetes mellitus  - Encouraged to eat small meals throughout the day especially when medications are being adjusted  - Recommend follow-up with Dr. Brigitte Pulse to optimize medication regimen 5.  Tobacco cessation counseling provided 6.  Return to clinic in 32-months   The duration of this appointment visit was 25 minutes of face-to-face time with the patient.  Greater than 50% of this time was spent in counseling, explanation of diagnosis, planning of further management, and coordination of care.   Thank you for allowing me to participate in patient's care.  If I can answer any additional questions, I would be pleased to do so.  Sincerely,    Donika K. Posey Pronto, DO

## 2013-10-06 NOTE — Progress Notes (Signed)
faxed

## 2013-10-11 ENCOUNTER — Encounter: Payer: Self-pay | Admitting: Neurology

## 2013-10-11 ENCOUNTER — Ambulatory Visit (INDEPENDENT_AMBULATORY_CARE_PROVIDER_SITE_OTHER): Payer: Medicare Other | Admitting: Neurology

## 2013-10-11 DIAGNOSIS — E538 Deficiency of other specified B group vitamins: Secondary | ICD-10-CM

## 2013-10-11 MED ORDER — CYANOCOBALAMIN 1000 MCG/ML IJ SOLN
1000.0000 ug | Freq: Once | INTRAMUSCULAR | Status: AC
  Administered 2013-10-11: 1000 ug via INTRAMUSCULAR

## 2013-10-11 NOTE — Progress Notes (Signed)
Patient in for b-12 injection 

## 2013-10-13 ENCOUNTER — Telehealth: Payer: Self-pay | Admitting: Neurology

## 2013-10-13 ENCOUNTER — Other Ambulatory Visit: Payer: Self-pay | Admitting: *Deleted

## 2013-10-13 MED ORDER — GABAPENTIN 300 MG PO CAPS: 300.0000 mg | ORAL_CAPSULE | Freq: Three times a day (TID) | ORAL | Status: DC

## 2013-10-13 NOTE — Telephone Encounter (Signed)
Per patients request follow up appointment 01-12-14

## 2013-10-19 ENCOUNTER — Ambulatory Visit (INDEPENDENT_AMBULATORY_CARE_PROVIDER_SITE_OTHER): Payer: Medicare Other | Admitting: Neurology

## 2013-10-19 DIAGNOSIS — H524 Presbyopia: Secondary | ICD-10-CM | POA: Diagnosis not present

## 2013-10-19 DIAGNOSIS — E538 Deficiency of other specified B group vitamins: Secondary | ICD-10-CM

## 2013-10-19 DIAGNOSIS — E119 Type 2 diabetes mellitus without complications: Secondary | ICD-10-CM | POA: Diagnosis not present

## 2013-10-19 DIAGNOSIS — H2510 Age-related nuclear cataract, unspecified eye: Secondary | ICD-10-CM | POA: Diagnosis not present

## 2013-10-19 DIAGNOSIS — H5200 Hypermetropia, unspecified eye: Secondary | ICD-10-CM | POA: Diagnosis not present

## 2013-10-19 MED ORDER — CYANOCOBALAMIN 1000 MCG/ML IJ SOLN
1000.0000 ug | Freq: Once | INTRAMUSCULAR | Status: AC
  Administered 2013-10-19: 1000 ug via INTRAMUSCULAR

## 2013-10-19 NOTE — Progress Notes (Signed)
Patient in for weekly b12 injection. Patient will return in one week

## 2013-10-24 ENCOUNTER — Encounter: Payer: Self-pay | Admitting: Neurology

## 2013-10-24 ENCOUNTER — Telehealth: Payer: Self-pay | Admitting: Neurology

## 2013-10-24 NOTE — Telephone Encounter (Signed)
lmom for patient to call office back

## 2013-10-24 NOTE — Telephone Encounter (Signed)
PATIENT HAS SOME QUESTIONS ABOUT THE MEDICATION PLEASE CALL

## 2013-10-26 ENCOUNTER — Other Ambulatory Visit: Payer: Self-pay | Admitting: *Deleted

## 2013-10-26 ENCOUNTER — Ambulatory Visit (INDEPENDENT_AMBULATORY_CARE_PROVIDER_SITE_OTHER): Payer: Medicare Other | Admitting: Neurology

## 2013-10-26 DIAGNOSIS — E538 Deficiency of other specified B group vitamins: Secondary | ICD-10-CM

## 2013-10-26 MED ORDER — GABAPENTIN 600 MG PO TABS: 600.0000 mg | ORAL_TABLET | Freq: Three times a day (TID) | ORAL | Status: DC

## 2013-10-26 MED ORDER — GABAPENTIN 300 MG PO CAPS: 300.0000 mg | ORAL_CAPSULE | Freq: Three times a day (TID) | ORAL | Status: DC

## 2013-10-26 MED ORDER — CYANOCOBALAMIN 1000 MCG/ML IJ SOLN
1000.0000 ug | Freq: Once | INTRAMUSCULAR | Status: AC
  Administered 2013-10-26: 1000 ug via INTRAMUSCULAR

## 2013-10-26 NOTE — Progress Notes (Signed)
Vitamin B12 injection given in Left Deltoid.

## 2013-11-02 ENCOUNTER — Ambulatory Visit (INDEPENDENT_AMBULATORY_CARE_PROVIDER_SITE_OTHER): Payer: Medicare Other | Admitting: *Deleted

## 2013-11-02 DIAGNOSIS — E538 Deficiency of other specified B group vitamins: Secondary | ICD-10-CM | POA: Diagnosis not present

## 2013-11-02 MED ORDER — CYANOCOBALAMIN 1000 MCG/ML IJ SOLN
1000.0000 ug | Freq: Once | INTRAMUSCULAR | Status: AC
  Administered 2013-11-02: 1000 ug via INTRAMUSCULAR

## 2013-11-03 NOTE — Progress Notes (Signed)
Injection given B12

## 2013-11-21 ENCOUNTER — Other Ambulatory Visit: Payer: Self-pay | Admitting: Nurse Practitioner

## 2013-11-26 DIAGNOSIS — J029 Acute pharyngitis, unspecified: Secondary | ICD-10-CM | POA: Diagnosis not present

## 2013-11-26 DIAGNOSIS — J309 Allergic rhinitis, unspecified: Secondary | ICD-10-CM | POA: Diagnosis not present

## 2013-11-26 DIAGNOSIS — J209 Acute bronchitis, unspecified: Secondary | ICD-10-CM | POA: Diagnosis not present

## 2013-12-05 ENCOUNTER — Ambulatory Visit (INDEPENDENT_AMBULATORY_CARE_PROVIDER_SITE_OTHER): Payer: Medicare Other | Admitting: Neurology

## 2013-12-05 DIAGNOSIS — E538 Deficiency of other specified B group vitamins: Secondary | ICD-10-CM

## 2013-12-05 MED ORDER — CYANOCOBALAMIN 1000 MCG/ML IJ SOLN
1000.0000 ug | Freq: Once | INTRAMUSCULAR | Status: AC
  Administered 2013-12-05: 1000 ug via INTRAMUSCULAR

## 2013-12-05 NOTE — Progress Notes (Signed)
Patient in for a b-12 injection in right arm

## 2013-12-06 ENCOUNTER — Other Ambulatory Visit: Payer: Self-pay | Admitting: *Deleted

## 2013-12-06 MED ORDER — BISOPROLOL-HYDROCHLOROTHIAZIDE 5-6.25 MG PO TABS: 1.0000 | ORAL_TABLET | Freq: Two times a day (BID) | ORAL | Status: DC

## 2013-12-09 ENCOUNTER — Ambulatory Visit (INDEPENDENT_AMBULATORY_CARE_PROVIDER_SITE_OTHER): Payer: Medicare Other | Admitting: Cardiovascular Disease

## 2013-12-09 ENCOUNTER — Encounter: Payer: Self-pay | Admitting: Cardiovascular Disease

## 2013-12-09 VITALS — BP 136/80 | HR 98 | Ht 67.0 in | Wt 196.1 lb

## 2013-12-09 DIAGNOSIS — I10 Essential (primary) hypertension: Secondary | ICD-10-CM

## 2013-12-09 MED ORDER — BISOPROLOL-HYDROCHLOROTHIAZIDE 5-6.25 MG PO TABS: 1.0000 | ORAL_TABLET | Freq: Two times a day (BID) | ORAL | Status: DC

## 2013-12-09 MED ORDER — LISINOPRIL 20 MG PO TABS: 20.0000 mg | ORAL_TABLET | Freq: Every day | ORAL | Status: DC

## 2013-12-09 NOTE — Progress Notes (Signed)
Melanie [name] Date of Birth  04-11-1946 [place]  [address]    [place]   [place], [place]  [address]    [place],   [address] [phone]  Fax  [phone]  [phone]  Fax [phone]   Problem List 1. HTN 2. Bradycardia 3. Hyperlipidemia 4. Depression 5. History of chest pain-cardiac catheterization in 2002 revealed normal coronary arteries and normal left ventricle systolic function 6. Former smoker-quit in November, 2012 7. Paroxysmal atrial fibrillation A. Type 2 diabetes mellitus  History of Present Illness: Melanie [name] is seen today for followup visit. She has history of hypertensive heart and bradycardia which has been improved with reduction of Cardizem. In general, she's been doing well and has adequate blood pressure control.   She feels fairly well but unfortunately she's gained a little bit of weight. She still is under tremendous stress taking care of her invalid husband at home. Unable to get a lot of regular exercise.  October 04, 2012: She is doing well.   Unfortunately she started smoking again. She's not having episodes of chest pain or shortness breath. She has a history of paroxysmal atrial fibrillation in the past and has been maintained on metoprolol and diltiazem for long time. She informed that the cost of her diltiazem will be increasing since she'll need to find a replacement   December 09, 2013:  Melanie [name] is doing OK.  She has been seen by Melanie [name] and Melanie [name] since I saw her. She had back surgery last year.  She's been out of her bisoprolol for the past 3 days which probably explains her fast heart rate.  Current Outpatient Prescriptions on File Prior to Visit  Medication Sig Dispense Refill  . aspirin EC 81 MG tablet Take 81 mg by mouth daily.      . bisoprolol-hydrochlorothiazide (ZIAC) 5-6.25 MG per tablet Take 1 tablet by mouth 2 (two) times daily.  60 tablet  0  . glipiZIDE  (GLUCOTROL) 5 MG tablet Take 5 mg by mouth 2 (two) times daily before a meal.      . lisinopril (PRINIVIL,ZESTRIL) 20 MG tablet Take 20 mg by mouth daily.      . metFORMIN (GLUCOPHAGE) 500 MG tablet Take 500 mg by mouth 2 (two) times daily with a meal.      . Misc Natural Products (OSTEO BI-FLEX ADV TRIPLE ST PO) Take by mouth daily.      . sertraline (ZOLOFT) 50 MG tablet Take 50 mg by mouth daily.       No current facility-administered medications on file prior to visit.    Allergies  Allergen Reactions  . Metoprolol Other (See Comments)    REACTION: Headache    Past Medical History  Diagnosis Date  . HTN (hypertension)   . Bradycardia   . DM (diabetes mellitus)   . Situational stress   . PAF (paroxysmal atrial fibrillation)   . Family history of anesthesia complication     mother had problems, but unsure of what they were  . GERD (gastroesophageal reflux disease)   . Arthritis     Past Surgical History  Procedure Laterality Date  . Abdominal hysterectomy    . Knee arthroscopy Bilateral 2007    aug and oct of 2007  . Carpal tunnel release Bilateral 2003  . Back surgery  2004  . Appendectomy    . Abdominal hysterectomy    . Joint replacement  both knees  . Colonoscopy    . Lumbar laminectomy N/A 07/20/2013    Procedure: MICRODISCECTOMY LUMBAR LAMINECTOMY;  Surgeon: [name], MD;  Location: [facility];  Service: Orthopedics;  Laterality: N/A;  L2-3 Decompression    History  Smoking status  . Current Every Day Smoker -- 0.50 packs/day for 30 years  . Last Attempt to Quit: 07/17/2011  Smokeless tobacco  . Not on file    History  Alcohol Use No    Family History  Problem Relation Age of Onset  . Heart attack Father     Deceased, 55  . Other Mother     Deceased, 62  . Neuropathy Mother   . Hypertension Mother   . Ovarian cancer Sister     Deceased, 39  . Neuropathy Sister   . Multiple sclerosis Sister     Reviw of Systems:  Reviewed in the HPI.   All other systems are negative.  Physical Exam: BP 136/80  Pulse 98  Ht 5\' 7"  (1.702 m)  Wt 196 lb 1.9 oz (88.959 kg)  BMI 30.71 kg/m2 The patient is alert and oriented x 3.  The mood and affect are normal.   Skin: warm and dry.  Color is normal.    HEENT:   Normocephalic/atraumatic. Pupils carotids. She is no JVD. Neck is supple.  Lungs: Lungs are clear to auscultation.   Heart: Regular rate S1-S2.    Abdomen: Abdomen soft tissues good bowel sounds.  Extremities:  No clubbing cyanosis or edema  Neuro:  Exam is nonfocal.    ECG:  Assessment / Plan:

## 2013-12-09 NOTE — Assessment & Plan Note (Signed)
Melanie [name] is doing fairly well. She ran out over bisoprolol / HCTZ   a several days ago. As a result, her heart rate is a little bit fast. Her blood pressure and heart rate have been well controlled up until that time.  She seems to be doing well from a cardiac standpoint. She hasn't coughed today and is getting over her upper respiratory tract infection. Unfortunately, she continues to smoke.  I've encouraged her to stop smoking. I'll see her again in one year for followup visit.

## 2013-12-09 NOTE — Patient Instructions (Signed)
Your physician wants you to follow-up in:  12 months.  You will receive a reminder letter in the mail two months in advance. If you don't receive a letter, please call our office to schedule the follow-up appointment.   

## 2014-01-05 DIAGNOSIS — E669 Obesity, unspecified: Secondary | ICD-10-CM | POA: Diagnosis not present

## 2014-01-05 DIAGNOSIS — I10 Essential (primary) hypertension: Secondary | ICD-10-CM | POA: Diagnosis not present

## 2014-01-05 DIAGNOSIS — G609 Hereditary and idiopathic neuropathy, unspecified: Secondary | ICD-10-CM | POA: Diagnosis not present

## 2014-01-05 DIAGNOSIS — Z1331 Encounter for screening for depression: Secondary | ICD-10-CM | POA: Diagnosis not present

## 2014-01-05 DIAGNOSIS — E781 Pure hyperglyceridemia: Secondary | ICD-10-CM | POA: Diagnosis not present

## 2014-01-05 DIAGNOSIS — E538 Deficiency of other specified B group vitamins: Secondary | ICD-10-CM | POA: Diagnosis not present

## 2014-01-05 DIAGNOSIS — N183 Chronic kidney disease, stage 3 unspecified: Secondary | ICD-10-CM | POA: Diagnosis not present

## 2014-01-05 DIAGNOSIS — E785 Hyperlipidemia, unspecified: Secondary | ICD-10-CM | POA: Diagnosis not present

## 2014-01-05 DIAGNOSIS — E1149 Type 2 diabetes mellitus with other diabetic neurological complication: Secondary | ICD-10-CM | POA: Diagnosis not present

## 2014-01-12 ENCOUNTER — Encounter: Payer: Self-pay | Admitting: *Deleted

## 2014-01-12 ENCOUNTER — Ambulatory Visit (INDEPENDENT_AMBULATORY_CARE_PROVIDER_SITE_OTHER): Payer: Medicare Other | Admitting: Neurology

## 2014-01-12 ENCOUNTER — Encounter: Payer: Self-pay | Admitting: Neurology

## 2014-01-12 VITALS — BP 96/60 | HR 73 | Wt 196.5 lb

## 2014-01-12 DIAGNOSIS — E1149 Type 2 diabetes mellitus with other diabetic neurological complication: Secondary | ICD-10-CM | POA: Diagnosis not present

## 2014-01-12 DIAGNOSIS — E1142 Type 2 diabetes mellitus with diabetic polyneuropathy: Secondary | ICD-10-CM

## 2014-01-12 DIAGNOSIS — E538 Deficiency of other specified B group vitamins: Secondary | ICD-10-CM

## 2014-01-12 DIAGNOSIS — E1140 Type 2 diabetes mellitus with diabetic neuropathy, unspecified: Secondary | ICD-10-CM

## 2014-01-12 MED ORDER — CYANOCOBALAMIN 1000 MCG/ML IJ SOLN
1000.0000 ug | Freq: Once | INTRAMUSCULAR | Status: AC
  Administered 2014-01-12: 1000 ug via INTRAMUSCULAR

## 2014-01-12 MED ORDER — PREGABALIN 150 MG PO CAPS: 150.0000 mg | ORAL_CAPSULE | Freq: Two times a day (BID) | ORAL | Status: DC

## 2014-01-12 NOTE — Progress Notes (Signed)
Patient in for B12 injection. 

## 2014-01-12 NOTE — Patient Instructions (Addendum)
1.  Start taking Lyrica 75mg  tablets as follows:  Week 1: Take 75mg  (1 tab) morning and evening      Reduce gabapentin 600mg  twice daily  Week 2: Take 75mg  (1 tab) in the morning and 150mg  (2 tab) in the evening                 Reduce gabapentin to 300mg  daily  Week 3: Take 150mg  (2 tab) twice daily      Stop gabapentin  2.  Samples provided for Lyrica 75mg  tablets.  Prescription for 150mg  tablets has been sent to your pharmacy, start taking this tablet when you are on week 2 3.  Call my office during the week of May 11th with an update 4.  Return to clinic in 5-months

## 2014-01-12 NOTE — Progress Notes (Signed)
Follow-up Visit   Date: 01/12/2014    Melanie [name] MRN: [id number] DOB: 05/02/1946   Interim History: Melanie [name] is a 68 y.o. right-handed Caucasian female with history of hypertension, bradycardia, diabetes mellitus (HbA1c 7.4), paroxysmal atrial fibrillation, GERD, and L2-L3 disc protrusion s/p decompression (07/2013) returning to the clinic for follow-up of diabetic neuropathy.  The patient was accompanied to the clinic by self.  She was last seen on 10/06/2013.  History of present illness: Starting in the Spring of 2014, she developed sharp radiating pain down her low back and legs and was found to have L2-3 disc protrusion. She underwent L2-L3 decompression in November 2014 which resolved her radiating back pain, but she has noticed numbness and tingling/stabbing pain over bilateral feet. When she severe, she feels as if there is "sand paper" on her feet. Symptoms are constant, worse in the evening. She was taking vicodin for her back pain which helped her feet symptoms. Prolonged standing and tight shoes exacerbates pain. Numbness and tingling is localized to her feet. Of note, she complains of numbness over the right lateral surface of the thigh which has been there since she was pregnant with her son.   Follow-up 10/06/2013:   After increasing her neurontin to 900mg  TID, she noticed an improvement in her painful paresthesias where it was nearly resolved.  Overall, pain reduction is about 40-50%.    - Follow-up 01/12/2014:  She was doing well on neurontin 900mg  TID until late March, but then developed worsening painful parethesias which travels from her feet to mild calf.  She has been getting B12 injections and reports an improvement in her energy level because she is less fatigued.  I asked about tobacco cessation and she reports having a lot of new life stressors.  She is very tearful when talking about her grandson who is no longer permitted to visit with her because of  family issues.     Medications:  Current Outpatient Prescriptions on File Prior to Visit  Medication Sig Dispense Refill  . aspirin EC 81 MG tablet Take 81 mg by mouth daily.      . bisoprolol-hydrochlorothiazide (ZIAC) 5-6.25 MG per tablet Take 1 tablet by mouth 2 (two) times daily.  180 tablet  3  . gabapentin (NEURONTIN) 800 MG tablet Take 900 mg by mouth 3 (three) times daily.      Marland Kitchen glipiZIDE (GLUCOTROL) 5 MG tablet Take 5 mg by mouth 2 (two) times daily before a meal.      . lisinopril (PRINIVIL,ZESTRIL) 20 MG tablet Take 1 tablet (20 mg total) by mouth daily.  90 tablet  3  . loratadine (CLARITIN) 10 MG tablet Take 10 mg by mouth daily.      . metFORMIN (GLUCOPHAGE) 500 MG tablet Take 500 mg by mouth 2 (two) times daily with a meal.      . Misc Natural Products (OSTEO BI-FLEX ADV TRIPLE ST PO) Take by mouth daily.      . sertraline (ZOLOFT) 50 MG tablet Take 50 mg by mouth daily.       No current facility-administered medications on file prior to visit.    Allergies:  Allergies  Allergen Reactions  . Metoprolol Other (See Comments)    REACTION: Headache    Review of Systems:  CONSTITUTIONAL: No fevers, chills, night sweats, or weight loss.   EYES: No visual changes or eye pain ENT: No hearing changes.  No history of nose bleeds.   RESPIRATORY: No cough,  wheezing and shortness of breath.   CARDIOVASCULAR: Negative for chest pain, and palpitations.   GI: Negative for abdominal discomfort, blood in stools or black stools.  No recent change in bowel habits.   GU:  No history of incontinence.   MUSCLOSKELETAL: No history of joint pain or swelling.  No myalgias.   SKIN: Negative for lesions, rash, and itching.   ENDOCRINE: Negative for cold or heat intolerance, polydipsia or goiter.   PSYCH:  +depression or anxiety symptoms.   NEURO: As Above.   Vital Signs:  BP 96/60  Pulse 73  Wt 196 lb 8 oz (89.132 kg)  SpO2 94%  Neurological Exam: MENTAL STATUS including  orientation to time, place, person, recent and remote memory, attention span and concentration, language, and fund of knowledge is normal.  She is tearful today due to family distress regarding her grandson  CRANIAL NERVES:  Pupils equal round and reactive to light.  Normal conjugate, extra-ocular eye movements in all directions of gaze.  Face is symmetric. Palate elevates symmetrically.  Tongue is midline.  MOTOR:  Motor strength is 5/5 in all extremities   MSRs:  Reflexes are 2+/4 in the upper extremities, 1+/4 at the patella and Achilles bilaterally.  SENSORY:  Vibration and pin prick is diminished to 50% at her ankles  COORDINATION/GAIT:  Gait narrow based and stable.   Data: Labs 09/22/2013:  Copper 85, B12 208*, TSH 0.40, SPEP/UPEP with IFE:  No M protein HbA1c 6.3 01/05/2014  MRI lumbar spine 03/15/2013:  1. Lumbar spondylosis and degenerative disc disease cause moderate impingement at L2-3 and L4-5, and mild impingement at L3-4 as detailed above.  2. 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 I doubt that this is a pheochromocytoma given the intermediate T2 signal characteristics, but otherwise this mass is nonspecific (although statistically most likely to represent an adenoma). If the patient has a history of lung cancer or if further workup is warranted, MRI of the adrenal glands with and without contrast provides the greatest diagnostic specificity.  3. Atrophic left kidney.   IMPRESSION/PLAN: 1.  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  Vitamin B12 deficiency, new problem  - Likely also contributing to neuropathy  - Continue vitamin B12 1019mcg IM injections monthly 3.  Lumbar spondylosis with moderate impingement at L2-3 and L4-5 s/p decompression (November 2014) 4.  Diabetes mellitus, well-controlled 5.  Tobacco cessation counseling provided 6.  Return to clinic in 40-months   The duration of this appointment visit was 30 minutes of face-to-face time with the patient.  Greater than 50% of this time was spent in counseling, explanation of diagnosis, planning of further management, and coordination of care.   Thank you for allowing me to participate in patient's care.  If I can  answer any additional questions, I would be pleased to do so.    Sincerely,    Lorrie Gargan K. Posey Pronto, DO

## 2014-01-30 ENCOUNTER — Telehealth: Payer: Self-pay | Admitting: Neurology

## 2014-01-30 NOTE — Telephone Encounter (Signed)
Pt called wanted to give an update on the Lyrica. Update: Pt was not able to get the Lyrica due to the cost. It was going to cost her $200 and she stated she can not Afford that. She is not taking Gabapentin.

## 2014-01-30 NOTE — Telephone Encounter (Signed)
Patient notified that I am waiting for the drug rep to call me back.

## 2014-01-30 NOTE — Telephone Encounter (Signed)
I called the pharmacist and they said that the card will not work because patient has part D medicare.  Patient said that [facility] pays for her medicines, not medicare.  Left message for the drug rep to call me back.

## 2014-01-30 NOTE — Telephone Encounter (Signed)
I spoke with patient and offered her a co-pay card.  She said that she had a co-pay card but it was still costing too much.  I told her that I would talk to the pharmacist and she what I could do.

## 2014-01-31 NOTE — Telephone Encounter (Signed)
Dianne with [employer] called me back and said that she would be sending a prior auth form for me to fill out.

## 2014-02-02 NOTE — Telephone Encounter (Signed)
I called patient back and informed her that I met with the drug rep this morning and spoke with her insurance company.  The medication does not need a prior authorization.  The estimated cost at [facility] or [facility] would be about $39.00 or $45.00 at [facility].  She said that she would take her Rx back and get it filled.  Instructed her to have the pharmacist call me if she has any problems.

## 2014-02-09 ENCOUNTER — Ambulatory Visit (INDEPENDENT_AMBULATORY_CARE_PROVIDER_SITE_OTHER): Payer: Medicare Other | Admitting: *Deleted

## 2014-02-09 DIAGNOSIS — E538 Deficiency of other specified B group vitamins: Secondary | ICD-10-CM | POA: Diagnosis not present

## 2014-02-09 MED ORDER — CYANOCOBALAMIN 1000 MCG/ML IJ SOLN
1000.0000 ug | Freq: Once | INTRAMUSCULAR | Status: AC
  Administered 2014-02-09: 1000 ug via INTRAMUSCULAR

## 2014-02-09 NOTE — Progress Notes (Signed)
Patient in for B12 injection. 

## 2014-02-19 IMAGING — DX DG LUMBAR SPINE 2-3V
2 series · 2 of 2 positions shown · non-contrast
Comparison: Lumbar spine films of 06/14/2013

CLINICAL DATA: LUmbar decompression at L2-3, localization

EXAM:
LUMBAR SPINE - 2-3 VIEW

[lat (1 of 2)]
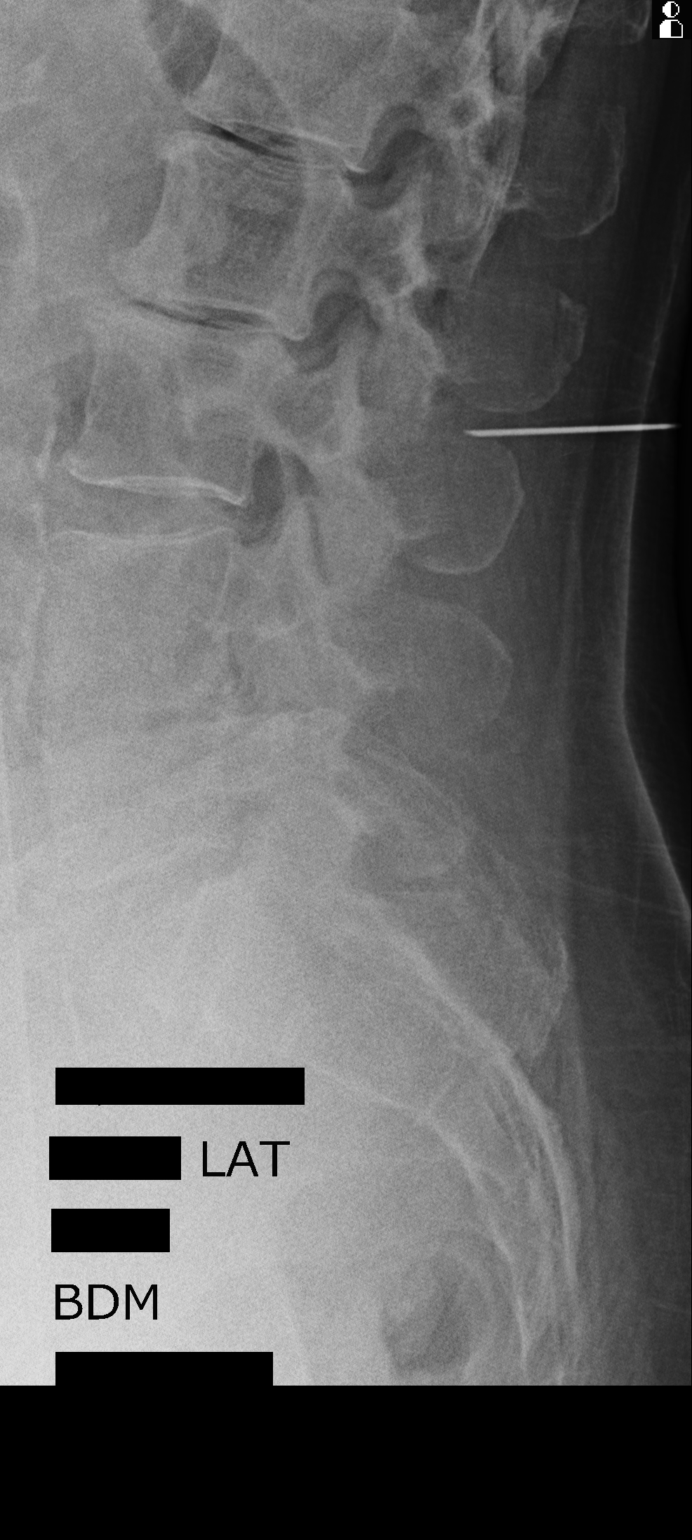

[lat (2 of 2)]
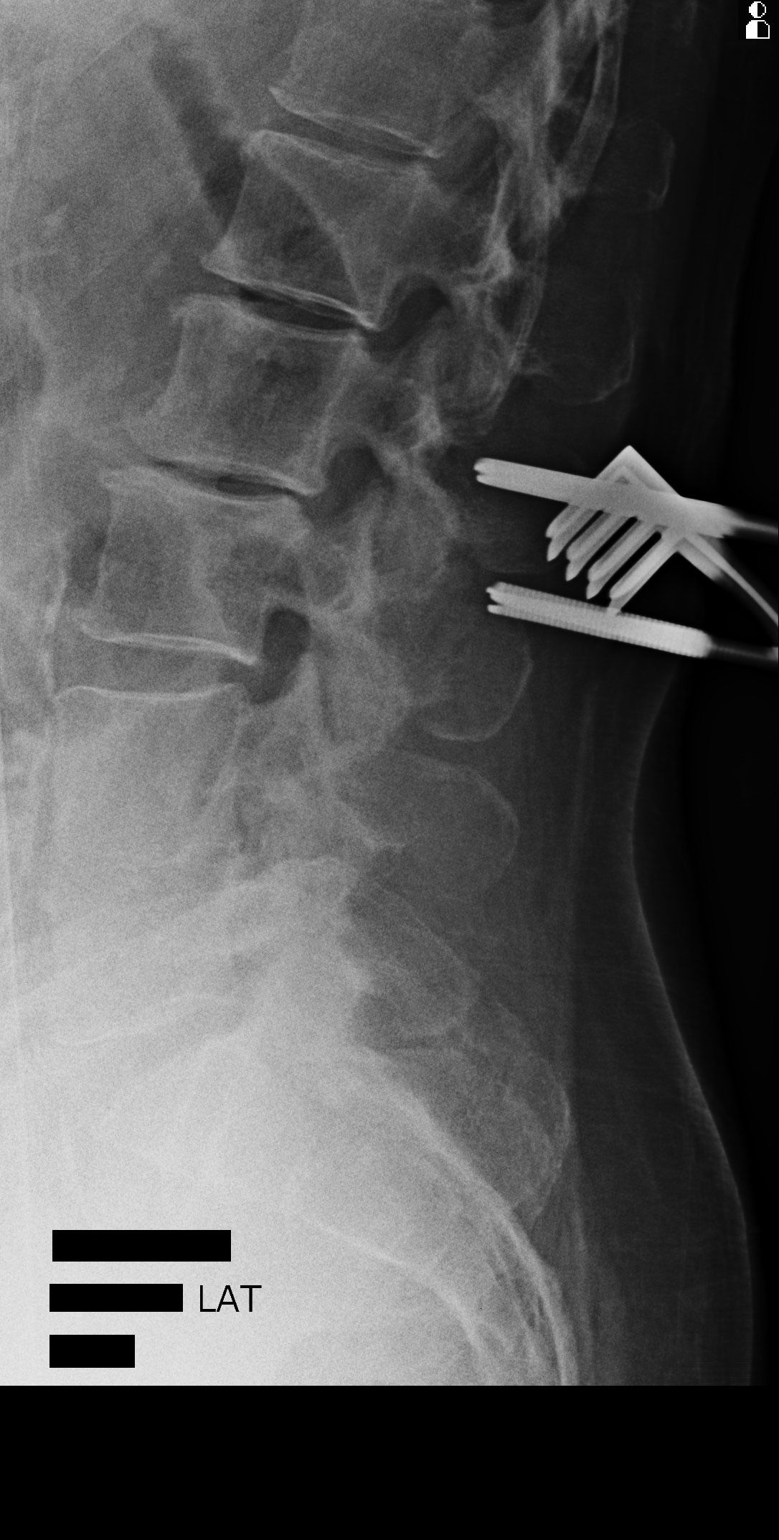

[2 of 2 positions shown; findings below may reference images not displayed]

FINDINGS: A needle was positioned posteriorly between the spinous processes of
L2-3 for localization of the L2-3 interspace. A 2nd film shows
retractors posteriorly at the L2-3 level.
IMPRESSION: Localization of L2-3.

## 2014-03-13 ENCOUNTER — Ambulatory Visit (INDEPENDENT_AMBULATORY_CARE_PROVIDER_SITE_OTHER): Payer: Medicare Other | Admitting: Neurology

## 2014-03-13 ENCOUNTER — Encounter: Payer: Self-pay | Admitting: Neurology

## 2014-03-13 ENCOUNTER — Ambulatory Visit: Payer: Medicare Other | Admitting: *Deleted

## 2014-03-13 VITALS — BP 110/72 | HR 79 | Ht 67.0 in | Wt 200.2 lb

## 2014-03-13 DIAGNOSIS — E538 Deficiency of other specified B group vitamins: Secondary | ICD-10-CM | POA: Diagnosis not present

## 2014-03-13 DIAGNOSIS — E1142 Type 2 diabetes mellitus with diabetic polyneuropathy: Secondary | ICD-10-CM | POA: Diagnosis not present

## 2014-03-13 DIAGNOSIS — E1149 Type 2 diabetes mellitus with other diabetic neurological complication: Secondary | ICD-10-CM

## 2014-03-13 DIAGNOSIS — E1349 Other specified diabetes mellitus with other diabetic neurological complication: Secondary | ICD-10-CM | POA: Diagnosis not present

## 2014-03-13 DIAGNOSIS — E0842 Diabetes mellitus due to underlying condition with diabetic polyneuropathy: Secondary | ICD-10-CM

## 2014-03-13 MED ORDER — CYANOCOBALAMIN 1000 MCG/ML IJ SOLN
1000.0000 ug | Freq: Once | INTRAMUSCULAR | Status: AC
  Administered 2014-03-13: 1000 ug via INTRAMUSCULAR

## 2014-03-13 NOTE — Progress Notes (Signed)
Done

## 2014-03-13 NOTE — Patient Instructions (Addendum)
1.  Continue neurontin 900mg  three times daily, ok to add extra 300mg   tablet for days when pain is severe 2.  Continue monthly vitamin B12 injections 3.  Encouraged to stop smoking! 4.  Return to clinic in 6 months

## 2014-03-13 NOTE — Progress Notes (Signed)
Follow-up Visit   Date: 03/13/2014    ROBERTTA HALFHILL MRN: [id number] DOB: 09-21-1945   Interim History: Melanie [name] is a 68 y.o. right-handed Caucasian female with history of hypertension, bradycardia, diabetes mellitus (HbA1c 7.4), paroxysmal atrial fibrillation, GERD, and L2-L3 disc protrusion s/p decompression (07/2013) returning to the clinic for follow-up of diabetic neuropathy.  The patient was accompanied to the clinic by self.  She was last seen on 01/12/2014.  History of present illness: Starting in the Spring of 2014, she developed sharp radiating pain down her low back and legs and was found to have L2-3 disc protrusion. She underwent L2-L3 decompression in November 2014 which resolved her radiating back pain, but she has noticed numbness and tingling/stabbing pain over bilateral feet. When she severe, she feels as if there is "sand paper" on her feet. Symptoms are constant, worse in the evening. She was taking vicodin for her back pain which helped her feet symptoms. Prolonged standing and tight shoes exacerbates pain. Numbness and tingling is localized to her feet. Of note, she complains of numbness over the right lateral surface of the thigh which has been there since she was pregnant with her son.   - Follow-up 10/06/2013:   After increasing her neurontin to 900mg  TID, she noticed an improvement in her painful paresthesias where it was nearly resolved.  Overall, pain reduction is about 40-50%.    - Follow-up 01/12/2014:  She was doing well on neurontin 900mg  TID until late March, but then developed worsening painful parethesias which travels from her feet to mild calf.  She has been getting B12 injections and reports an improvement in her energy level because she is less fatigued.  I asked about tobacco cessation and she reports having a lot of new life stressors.  She is very tearful when talking about her grandson who is no longer permitted to visit with her because of  family issues.    - Follow-up 03/13/2014:  She is getting about 75% reduction with gabapentin 900mg  TID.  Worsening of symptoms at her last visit is most likely due to life stressors.  Mood is improved now that her grandson was recently in behavorial health and has been able to spend more time with her.     Medications:  Current Outpatient Prescriptions on File Prior to Visit  Medication Sig Dispense Refill  . aspirin EC 81 MG tablet Take 81 mg by mouth daily.      . bisoprolol-hydrochlorothiazide (ZIAC) 5-6.25 MG per tablet Take 1 tablet by mouth 2 (two) times daily.  180 tablet  3  . gabapentin (NEURONTIN) 800 MG tablet Take 900 mg by mouth 3 (three) times daily. She takes 600mg  + 300mg  tablet.      Marland Kitchen glipiZIDE (GLUCOTROL) 5 MG tablet Take 5 mg by mouth 2 (two) times daily before a meal.      . lisinopril (PRINIVIL,ZESTRIL) 20 MG tablet Take 1 tablet (20 mg total) by mouth daily.  90 tablet  3  . loratadine (CLARITIN) 10 MG tablet Take 10 mg by mouth daily.      . metFORMIN (GLUCOPHAGE) 500 MG tablet Take 500 mg by mouth 2 (two) times daily with a meal.      . Misc Natural Products (OSTEO BI-FLEX ADV TRIPLE ST PO) Take by mouth daily.      . sertraline (ZOLOFT) 50 MG tablet Take 50 mg by mouth daily.       No current facility-administered medications on file prior  to visit.    Allergies:  Allergies  Allergen Reactions  . Metoprolol Other (See Comments)    REACTION: Headache    Review of Systems:  CONSTITUTIONAL: No fevers, chills, night sweats, or weight loss.   EYES: No visual changes or eye pain ENT: No hearing changes.  No history of nose bleeds.   RESPIRATORY: No cough, wheezing and shortness of breath.   CARDIOVASCULAR: Negative for chest pain, and palpitations.   GI: Negative for abdominal discomfort, blood in stools or black stools.  No recent change in bowel habits.   GU:  No history of incontinence.   MUSCLOSKELETAL: No history of joint pain or swelling.  No myalgias.     SKIN: Negative for lesions, rash, and itching.   ENDOCRINE: Negative for cold or heat intolerance, polydipsia or goiter.   PSYCH:  +depression or anxiety symptoms.   NEURO: As Above.   Vital Signs:  BP 110/72  Pulse 79  Ht 5\' 7"  (1.702 m)  Wt 200 lb 4 oz (90.833 kg)  BMI 31.36 kg/m2  SpO2 96%  Neurological Exam: MENTAL STATUS including orientation to time, place, person, recent and remote memory, attention span and concentration, language, and fund of knowledge is normal.  Mood is much better today.  CRANIAL NERVES:  Pupils equal round and reactive to light.  Normal conjugate, extra-ocular eye movements in all directions of gaze.  Face is symmetric. Palate elevates symmetrically.  Tongue is midline.  MOTOR:  Motor strength is 5/5 in all extremities   MSRs:  Reflexes are 2+/4 in the upper extremities, 1+/4 at the patella and Achilles bilaterally.  SENSORY:  Vibration and pin prick is diminished to 50% at her ankles  COORDINATION/GAIT:  Gait narrow based and stable.   Data: Labs 09/22/2013:  Copper 85, B12 208*, TSH 0.40, SPEP/UPEP with IFE:  No M protein HbA1c 6.3 01/05/2014  MRI lumbar spine 03/15/2013:  1. Lumbar spondylosis and degenerative disc disease cause moderate impingement at L2-3 and L4-5, and mild impingement at L3-4 as detailed above.  2. 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 I doubt that this is a pheochromocytoma given the intermediate T2 signal characteristics, but otherwise this mass is nonspecific (although statistically most likely to represent an adenoma). If the patient has a history of lung cancer or if further workup is warranted, MRI of the adrenal glands with and without contrast provides the greatest diagnostic specificity.  3. Atrophic left kidney.   IMPRESSION/PLAN: 1.  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  Vitamin B12 deficiency  - Likely also contributing to neuropathy  - B12 injection today and then monthly 3.  Lumbar spondylosis with moderate impingement at L2-3 and L4-5 s/p decompression (November 2014) 4.  Diabetes mellitus, well-controlled 5.  Tobacco cessation counseling provided at length 6.  Return to clinic in 28-months   The duration of this appointment visit was 25 minutes of face-to-face time with the patient.  Greater than 50% of this time was spent in counseling, explanation of diagnosis, planning of further management, and coordination of care.   Thank you for allowing me to participate in patient's care.  If I can answer any additional questions, I would be  pleased to do so.    Sincerely,    Dynastie Knoop K. Posey Pronto, DO

## 2014-03-13 NOTE — Progress Notes (Signed)
Patient in for office visit and B12 injection.

## 2014-03-15 DIAGNOSIS — M542 Cervicalgia: Secondary | ICD-10-CM | POA: Diagnosis not present

## 2014-03-27 DIAGNOSIS — M47812 Spondylosis without myelopathy or radiculopathy, cervical region: Secondary | ICD-10-CM | POA: Diagnosis not present

## 2014-03-28 DIAGNOSIS — M542 Cervicalgia: Secondary | ICD-10-CM | POA: Diagnosis not present

## 2014-03-28 DIAGNOSIS — M47812 Spondylosis without myelopathy or radiculopathy, cervical region: Secondary | ICD-10-CM | POA: Diagnosis not present

## 2014-04-06 DIAGNOSIS — M47812 Spondylosis without myelopathy or radiculopathy, cervical region: Secondary | ICD-10-CM | POA: Diagnosis not present

## 2014-04-06 DIAGNOSIS — M542 Cervicalgia: Secondary | ICD-10-CM | POA: Diagnosis not present

## 2014-04-07 ENCOUNTER — Ambulatory Visit (INDEPENDENT_AMBULATORY_CARE_PROVIDER_SITE_OTHER): Payer: Medicare Other | Admitting: *Deleted

## 2014-04-07 DIAGNOSIS — I10 Essential (primary) hypertension: Secondary | ICD-10-CM | POA: Diagnosis not present

## 2014-04-07 DIAGNOSIS — E1149 Type 2 diabetes mellitus with other diabetic neurological complication: Secondary | ICD-10-CM | POA: Diagnosis not present

## 2014-04-07 DIAGNOSIS — E538 Deficiency of other specified B group vitamins: Secondary | ICD-10-CM | POA: Diagnosis not present

## 2014-04-07 DIAGNOSIS — E781 Pure hyperglyceridemia: Secondary | ICD-10-CM | POA: Diagnosis not present

## 2014-04-07 DIAGNOSIS — E785 Hyperlipidemia, unspecified: Secondary | ICD-10-CM | POA: Diagnosis not present

## 2014-04-07 DIAGNOSIS — G609 Hereditary and idiopathic neuropathy, unspecified: Secondary | ICD-10-CM | POA: Diagnosis not present

## 2014-04-07 DIAGNOSIS — E669 Obesity, unspecified: Secondary | ICD-10-CM | POA: Diagnosis not present

## 2014-04-07 DIAGNOSIS — IMO0002 Reserved for concepts with insufficient information to code with codable children: Secondary | ICD-10-CM | POA: Diagnosis not present

## 2014-04-07 MED ORDER — CYANOCOBALAMIN 1000 MCG/ML IJ SOLN
1000.0000 ug | Freq: Once | INTRAMUSCULAR | Status: AC
Start: 2014-04-07 — End: 2014-04-07
  Administered 2014-04-07: 1000 ug via INTRAMUSCULAR

## 2014-04-07 NOTE — Progress Notes (Signed)
Patient in for B12 injection. 

## 2014-04-26 ENCOUNTER — Ambulatory Visit (INDEPENDENT_AMBULATORY_CARE_PROVIDER_SITE_OTHER): Payer: Medicare Other | Admitting: *Deleted

## 2014-04-26 DIAGNOSIS — E538 Deficiency of other specified B group vitamins: Secondary | ICD-10-CM | POA: Diagnosis not present

## 2014-04-26 MED ORDER — CYANOCOBALAMIN 1000 MCG/ML IJ SOLN
1000.0000 ug | Freq: Once | INTRAMUSCULAR | Status: AC
  Administered 2014-04-26: 1000 ug via INTRAMUSCULAR

## 2014-04-26 NOTE — Progress Notes (Signed)
Patient in for B12 injection. 

## 2014-04-27 ENCOUNTER — Other Ambulatory Visit: Payer: Self-pay | Admitting: *Deleted

## 2014-04-27 ENCOUNTER — Telehealth: Payer: Self-pay | Admitting: Neurology

## 2014-04-27 MED ORDER — GABAPENTIN 600 MG PO TABS: 600.0000 mg | ORAL_TABLET | Freq: Three times a day (TID) | ORAL | Status: DC

## 2014-04-27 NOTE — Telephone Encounter (Signed)
Rx sent 

## 2014-04-27 NOTE — Telephone Encounter (Signed)
Pt called requesting a refill for her Gabapentin for 600mg . Her pharmacy is now [facility] in [place]  C/B [phone]

## 2014-05-23 ENCOUNTER — Telehealth: Payer: Self-pay | Admitting: Neurology

## 2014-05-23 ENCOUNTER — Other Ambulatory Visit: Payer: Self-pay | Admitting: *Deleted

## 2014-05-23 MED ORDER — GABAPENTIN 600 MG PO TABS: 600.0000 mg | ORAL_TABLET | Freq: Three times a day (TID) | ORAL | Status: DC

## 2014-05-23 NOTE — Telephone Encounter (Signed)
Needs refill on Gabapentin / Sherri S.

## 2014-05-25 ENCOUNTER — Other Ambulatory Visit: Payer: Self-pay | Admitting: *Deleted

## 2014-05-25 MED ORDER — GABAPENTIN 600 MG PO TABS: 600.0000 mg | ORAL_TABLET | Freq: Three times a day (TID) | ORAL | Status: DC

## 2014-05-25 NOTE — Telephone Encounter (Signed)
Pt called requesting a refill for Gabapentin for 300mg . She would like a 90 day supply.  Pharmacy: [facility] on [address] C/B [phone]

## 2014-05-25 NOTE — Telephone Encounter (Signed)
Refill sent.  Will try for 90 day supply.

## 2014-05-30 ENCOUNTER — Other Ambulatory Visit: Payer: Self-pay | Admitting: *Deleted

## 2014-05-30 ENCOUNTER — Telehealth: Payer: Self-pay | Admitting: Neurology

## 2014-05-30 MED ORDER — GABAPENTIN 300 MG PO CAPS: 300.0000 mg | ORAL_CAPSULE | Freq: Three times a day (TID) | ORAL | Status: DC

## 2014-05-30 MED ORDER — GABAPENTIN 600 MG PO TABS: 600.0000 mg | ORAL_TABLET | Freq: Three times a day (TID) | ORAL | Status: DC

## 2014-05-30 NOTE — Telephone Encounter (Signed)
Pt called stating that her Gabapentin for 300mg  has not been called in to her pharmacy. Please call pt ASAP c/b Spoke w/ May from PCP on 05/30/14 -AL

## 2014-05-30 NOTE — Telephone Encounter (Signed)
Spoke with patient and she clarified that she just needs 300 mg tid called in.  New Rx sent to pharmacy.

## 2014-05-31 ENCOUNTER — Ambulatory Visit (INDEPENDENT_AMBULATORY_CARE_PROVIDER_SITE_OTHER): Payer: Medicare Other | Admitting: *Deleted

## 2014-05-31 DIAGNOSIS — E559 Vitamin D deficiency, unspecified: Secondary | ICD-10-CM | POA: Diagnosis not present

## 2014-05-31 DIAGNOSIS — E538 Deficiency of other specified B group vitamins: Secondary | ICD-10-CM | POA: Diagnosis not present

## 2014-05-31 MED ORDER — CYANOCOBALAMIN 1000 MCG/ML IJ SOLN
1000.0000 ug | Freq: Once | INTRAMUSCULAR | Status: AC
Start: 2014-05-31 — End: 2014-05-31
  Administered 2014-05-31: 1000 ug via INTRAMUSCULAR

## 2014-07-06 ENCOUNTER — Ambulatory Visit (INDEPENDENT_AMBULATORY_CARE_PROVIDER_SITE_OTHER): Payer: Medicare Other | Admitting: *Deleted

## 2014-07-06 DIAGNOSIS — E785 Hyperlipidemia, unspecified: Secondary | ICD-10-CM | POA: Diagnosis not present

## 2014-07-06 DIAGNOSIS — E538 Deficiency of other specified B group vitamins: Secondary | ICD-10-CM | POA: Diagnosis not present

## 2014-07-06 DIAGNOSIS — I10 Essential (primary) hypertension: Secondary | ICD-10-CM | POA: Diagnosis not present

## 2014-07-06 DIAGNOSIS — E1149 Type 2 diabetes mellitus with other diabetic neurological complication: Secondary | ICD-10-CM | POA: Diagnosis not present

## 2014-07-06 MED ORDER — CYANOCOBALAMIN 1000 MCG/ML IJ SOLN
1000.0000 ug | Freq: Once | INTRAMUSCULAR | Status: AC
  Administered 2014-07-06: 1000 ug via INTRAMUSCULAR

## 2014-07-14 ENCOUNTER — Other Ambulatory Visit: Payer: Self-pay | Admitting: Internal Medicine

## 2014-07-14 DIAGNOSIS — I6529 Occlusion and stenosis of unspecified carotid artery: Secondary | ICD-10-CM | POA: Diagnosis not present

## 2014-07-14 DIAGNOSIS — G629 Polyneuropathy, unspecified: Secondary | ICD-10-CM | POA: Diagnosis not present

## 2014-07-14 DIAGNOSIS — N183 Chronic kidney disease, stage 3 (moderate): Secondary | ICD-10-CM | POA: Diagnosis not present

## 2014-07-14 DIAGNOSIS — I10 Essential (primary) hypertension: Secondary | ICD-10-CM | POA: Diagnosis not present

## 2014-07-14 DIAGNOSIS — E781 Pure hyperglyceridemia: Secondary | ICD-10-CM | POA: Diagnosis not present

## 2014-07-14 DIAGNOSIS — Z23 Encounter for immunization: Secondary | ICD-10-CM | POA: Diagnosis not present

## 2014-07-14 DIAGNOSIS — Z0000 Encounter for general adult medical examination without abnormal findings: Secondary | ICD-10-CM | POA: Diagnosis not present

## 2014-07-14 DIAGNOSIS — Z1231 Encounter for screening mammogram for malignant neoplasm of breast: Secondary | ICD-10-CM

## 2014-07-14 DIAGNOSIS — E785 Hyperlipidemia, unspecified: Secondary | ICD-10-CM | POA: Diagnosis not present

## 2014-07-14 DIAGNOSIS — Z720 Tobacco use: Secondary | ICD-10-CM | POA: Diagnosis not present

## 2014-07-14 DIAGNOSIS — E1149 Type 2 diabetes mellitus with other diabetic neurological complication: Secondary | ICD-10-CM | POA: Diagnosis not present

## 2014-07-17 ENCOUNTER — Other Ambulatory Visit (HOSPITAL_COMMUNITY): Payer: Self-pay | Admitting: Internal Medicine

## 2014-07-17 ENCOUNTER — Ambulatory Visit (HOSPITAL_COMMUNITY)
Admission: RE | Admit: 2014-07-17 | Discharge: 2014-07-17 | Disposition: A | Discharge status: Home / Self Care | Payer: Medicare Other | Source: Ambulatory Visit | Attending: Surgery | Admitting: Surgery

## 2014-07-17 DIAGNOSIS — I6529 Occlusion and stenosis of unspecified carotid artery: Secondary | ICD-10-CM

## 2014-07-26 DIAGNOSIS — Z1212 Encounter for screening for malignant neoplasm of rectum: Secondary | ICD-10-CM | POA: Diagnosis not present

## 2014-07-28 ENCOUNTER — Ambulatory Visit
Admission: RE | Admit: 2014-07-28 | Discharge: 2014-07-28 | Disposition: A | Discharge status: Home / Self Care | Payer: Medicare Other | Source: Ambulatory Visit | Attending: Internal Medicine | Admitting: Internal Medicine

## 2014-07-28 DIAGNOSIS — Z1231 Encounter for screening mammogram for malignant neoplasm of breast: Secondary | ICD-10-CM

## 2014-07-31 ENCOUNTER — Other Ambulatory Visit: Payer: Self-pay | Admitting: Internal Medicine

## 2014-07-31 DIAGNOSIS — N644 Mastodynia: Secondary | ICD-10-CM

## 2014-07-31 DIAGNOSIS — N6489 Other specified disorders of breast: Secondary | ICD-10-CM

## 2014-08-01 ENCOUNTER — Ambulatory Visit (INDEPENDENT_AMBULATORY_CARE_PROVIDER_SITE_OTHER): Payer: Medicare Other | Admitting: *Deleted

## 2014-08-01 DIAGNOSIS — E538 Deficiency of other specified B group vitamins: Secondary | ICD-10-CM | POA: Diagnosis not present

## 2014-08-01 DIAGNOSIS — Z780 Asymptomatic menopausal state: Secondary | ICD-10-CM | POA: Diagnosis not present

## 2014-08-01 MED ORDER — CYANOCOBALAMIN 1000 MCG/ML IJ SOLN
1000.0000 ug | Freq: Once | INTRAMUSCULAR | Status: AC
  Administered 2014-08-01: 1000 ug via INTRAMUSCULAR

## 2014-08-01 NOTE — Progress Notes (Signed)
Patient in for B12 injection. 

## 2014-08-09 ENCOUNTER — Ambulatory Visit
Admission: RE | Admit: 2014-08-09 | Discharge: 2014-08-09 | Disposition: A | Discharge status: Home / Self Care | Payer: Medicare Other | Source: Ambulatory Visit | Attending: Internal Medicine | Admitting: Internal Medicine

## 2014-08-09 DIAGNOSIS — N644 Mastodynia: Secondary | ICD-10-CM

## 2014-08-09 DIAGNOSIS — N6489 Other specified disorders of breast: Secondary | ICD-10-CM

## 2014-09-04 ENCOUNTER — Other Ambulatory Visit: Payer: Self-pay

## 2014-09-04 ENCOUNTER — Encounter: Payer: Self-pay | Admitting: Neurology

## 2014-09-04 ENCOUNTER — Ambulatory Visit (INDEPENDENT_AMBULATORY_CARE_PROVIDER_SITE_OTHER): Payer: Medicare Other | Admitting: Neurology

## 2014-09-04 VITALS — BP 118/68 | HR 80 | Ht 67.0 in | Wt 206.4 lb

## 2014-09-04 DIAGNOSIS — E538 Deficiency of other specified B group vitamins: Secondary | ICD-10-CM

## 2014-09-04 DIAGNOSIS — E1142 Type 2 diabetes mellitus with diabetic polyneuropathy: Secondary | ICD-10-CM

## 2014-09-04 DIAGNOSIS — Z720 Tobacco use: Secondary | ICD-10-CM

## 2014-09-04 DIAGNOSIS — I6529 Occlusion and stenosis of unspecified carotid artery: Secondary | ICD-10-CM | POA: Diagnosis not present

## 2014-09-04 DIAGNOSIS — F17200 Nicotine dependence, unspecified, uncomplicated: Secondary | ICD-10-CM

## 2014-09-04 MED ORDER — BISOPROLOL-HYDROCHLOROTHIAZIDE 5-6.25 MG PO TABS: 1.0000 | ORAL_TABLET | Freq: Two times a day (BID) | ORAL | Status: DC

## 2014-09-04 MED ORDER — NORTRIPTYLINE HCL 10 MG PO CAPS: 10.0000 mg | ORAL_CAPSULE | Freq: Every day | ORAL | Status: DC

## 2014-09-04 MED ORDER — CYANOCOBALAMIN 1000 MCG/ML IJ SOLN
1000.0000 ug | Freq: Once | INTRAMUSCULAR | Status: AC
  Administered 2014-09-04: 1000 ug via INTRAMUSCULAR

## 2014-09-04 MED ORDER — GABAPENTIN 300 MG PO CAPS: 300.0000 mg | ORAL_CAPSULE | Freq: Three times a day (TID) | ORAL | Status: DC

## 2014-09-04 NOTE — Progress Notes (Signed)
Patient in for B12 injection. 

## 2014-09-04 NOTE — Progress Notes (Signed)
Follow-up Visit   Date: 09/04/2014    Melanie [name] MRN: [id number] DOB: 11-21-1945   Interim History: Melanie [name] is a 68 y.o. right-handed Caucasian female with hypertension, bradycardia, diabetes mellitus, paroxysmal atrial fibrillation, GERD, and L2-L3 disc protrusion s/p decompression (07/2013) returning to the clinic for follow-up of diabetic neuropathy.   History of present illness: Starting in the Spring of 2014, she developed sharp radiating pain down her low back and legs and was found to have L2-3 disc protrusion. She underwent L2-L3 decompression in November 2014 which resolved her radiating back pain, but she has noticed numbness and tingling/stabbing pain over bilateral feet. When she severe, she feels as if there is "sand paper" on her feet. Symptoms are constant, worse in the evening. She was taking vicodin for her back pain which helped her feet symptoms. Prolonged standing and tight shoes exacerbates pain. Numbness and tingling is localized to her feet. Of note, she complains of numbness over the right lateral surface of the thigh which has been there since she was pregnant with her son.   - Follow-up 10/06/2013:   After increasing her neurontin to 900mg  TID, she noticed an improvement in her painful paresthesias where it was nearly resolved.    - Follow-up 01/12/2014:  She was doing well on neurontin 900mg  TID until late March, but then developed worsening painful parethesias which travels from her feet to mild calf.  She has been getting B12 injections and reports an improvement in her energy level because she is less fatigued.   - Follow-up 03/13/2014:  She is getting about 75% reduction with gabapentin 900mg  TID.  Worsening of symptoms at her last visit is most likely due to life stressors.    - UPDATE 09/04/2014:  She is doing great with her diabetes and says her last HbA1c was ~5%.  She continues to get benefit from gabapentin 900mg  TID, but has intermittent  bad days where nothing helps her pain.  When she takes an extra gabapentin, she gets very sleepy.  No new neurological complaints.  No interval falls or illnesses.   Medications:  Current Outpatient Prescriptions on File Prior to Visit  Medication Sig Dispense Refill  . aspirin EC 81 MG tablet Take 81 mg by mouth daily.    . bisoprolol-hydrochlorothiazide (ZIAC) 5-6.25 MG per tablet Take 1 tablet by mouth 2 (two) times daily. 180 tablet 3  . gabapentin (NEURONTIN) 300 MG capsule Take 1 capsule (300 mg total) by mouth 3 (three) times daily. 270 capsule 3  . gabapentin (NEURONTIN) 600 MG tablet Take 1 tablet (600 mg total) by mouth 3 (three) times daily. 270 tablet 3  . glipiZIDE (GLUCOTROL) 5 MG tablet Take 5 mg by mouth 2 (two) times daily before a meal.    . lisinopril (PRINIVIL,ZESTRIL) 20 MG tablet Take 1 tablet (20 mg total) by mouth daily. 90 tablet 3  . loratadine (CLARITIN) 10 MG tablet Take 10 mg by mouth daily.    . metFORMIN (GLUCOPHAGE) 500 MG tablet Take 500 mg by mouth 2 (two) times daily with a meal.    . Misc Natural Products (OSTEO BI-FLEX ADV TRIPLE ST PO) Take by mouth daily.    . sertraline (ZOLOFT) 50 MG tablet Take 50 mg by mouth daily.     No current facility-administered medications on file prior to visit.    Allergies:  Allergies  Allergen Reactions  . Metoprolol Other (See Comments)    REACTION: Headache    Review of  Systems:  CONSTITUTIONAL: No fevers, chills, night sweats, or weight loss.   EYES: No visual changes or eye pain ENT: No hearing changes.  No history of nose bleeds.   RESPIRATORY: No cough, wheezing and shortness of breath.   CARDIOVASCULAR: Negative for chest pain, and palpitations.   GI: Negative for abdominal discomfort, blood in stools or black stools.  No recent change in bowel habits.   GU:  No history of incontinence.   MUSCLOSKELETAL: No history of joint pain or swelling.  No myalgias.   SKIN: Negative for lesions, rash, and itching.    ENDOCRINE: Negative for cold or heat intolerance, polydipsia or goiter.   PSYCH:  +depression or anxiety symptoms.   NEURO: As Above.   Vital Signs:  BP 118/68 mmHg  Pulse 80  Ht 5\' 7"  (1.702 m)  Wt 206 lb 7 oz (93.639 kg)  BMI 32.32 kg/m2  SpO2 96%  Neurological Exam: MENTAL STATUS including orientation to time, place, person, recent and remote memory, attention span and concentration, language, and fund of knowledge is normal.    CRANIAL NERVES:  Pupils equal round and reactive to light.  Normal conjugate, extra-ocular eye movements in all directions of gaze.  Face is symmetric.   MOTOR:  Motor strength is 5/5 in all extremities, including distally in the legs.    MSRs:  Reflexes are 2+/4 in the upper extremities, 1+/4 at the patella and Achilles bilaterally.  SENSORY:  Vibration and pin prick is diminished to 50% at her ankles and pin prick is absent at toes.  Rhomberg is negative.   COORDINATION/GAIT:  Gait narrow based and stable.   Data: Labs 09/22/2013:  Copper 85, B12 208*, TSH 0.40, SPEP/UPEP with IFE:  No M protein HbA1c 6.3 01/05/2014  MRI lumbar spine 03/15/2013:  1. Lumbar spondylosis and degenerative disc disease cause moderate impingement at L2-3 and L4-5, and mild impingement at L3-4 as detailed above.  2. 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 I doubt that this is a pheochromocytoma given the intermediate T2 signal characteristics, but otherwise this mass is nonspecific (although statistically most likely to represent an adenoma). If the patient has a history of lung cancer or if further workup is warranted, MRI of the adrenal glands with and without contrast provides the greatest diagnostic specificity.  3. Atrophic left kidney.   IMPRESSION/PLAN: 1.  Distal and symmetric peripheral neuropathy due to diabetes  - Exam is stable.  Clinically pain seems well controlled most days, but worse when she is on her feet a lot  - Continue neurontin 900mg  TID,  ok to add extra 300mg  dose for days severe pain prn  - Start nortriptyline 10mg  at bedtime, uptitrate as needed 2.  Vitamin B12 deficiency  - Continue vitamin B12 injection monthly - will administer today 3.  Lumbar spondylosis with moderate impingement at L2-3 and L4-5 s/p decompression (November 2014) 4.  Diabetes mellitus (HbA1c 5), well-controlled 5.  Smoking cessation instruction/counseling given:  counseled patient on the dangers of tobacco use, advised patient to stop smoking, and reviewed strategies to maximize success 6.  Return to clinic in 32-months   The duration of this appointment visit was 25 minutes of face-to-face time with the patient.  Greater than 50% of this time was spent in counseling, explanation of diagnosis, planning of further management, and coordination of care.   Thank you for allowing me to participate in patient's care.  If I can answer any additional questions, I would  be pleased to do so.    Sincerely,    Qianna Clagett K. Posey Pronto, DO

## 2014-09-04 NOTE — Patient Instructions (Signed)
1.  Start nortriptyline 10mg  at bedtime 2.  Continue gabapentin 900mg  three times daily 3.  Continue vitamin B12 injections monthly 4.  Encouraged to stop smoking 5.  Keep up the good work with maintaining your blood sugars! 6.  Return to clinic in 3-months

## 2014-09-27 DIAGNOSIS — E559 Vitamin D deficiency, unspecified: Secondary | ICD-10-CM | POA: Insufficient documentation

## 2014-10-10 ENCOUNTER — Encounter: Payer: Self-pay | Admitting: Cardiovascular Disease

## 2014-10-10 ENCOUNTER — Ambulatory Visit (INDEPENDENT_AMBULATORY_CARE_PROVIDER_SITE_OTHER): Payer: Medicare Other | Admitting: *Deleted

## 2014-10-10 ENCOUNTER — Ambulatory Visit (INDEPENDENT_AMBULATORY_CARE_PROVIDER_SITE_OTHER): Payer: Medicare Other | Admitting: Cardiovascular Disease

## 2014-10-10 VITALS — BP 130/70 | HR 69 | Ht 67.0 in | Wt 208.0 lb

## 2014-10-10 DIAGNOSIS — R001 Bradycardia, unspecified: Secondary | ICD-10-CM

## 2014-10-10 DIAGNOSIS — E538 Deficiency of other specified B group vitamins: Secondary | ICD-10-CM

## 2014-10-10 MED ORDER — CYANOCOBALAMIN 1000 MCG/ML IJ SOLN
1000.0000 ug | Freq: Once | INTRAMUSCULAR | Status: AC
  Administered 2014-10-10: 1000 ug via INTRAMUSCULAR

## 2014-10-10 MED ORDER — BISOPROLOL-HYDROCHLOROTHIAZIDE 5-6.25 MG PO TABS: 1.0000 | ORAL_TABLET | Freq: Two times a day (BID) | ORAL | Status: DC

## 2014-10-10 NOTE — Progress Notes (Signed)
Patient in for B12 injection. 

## 2014-10-10 NOTE — Progress Notes (Signed)
Cardiology Office Note   Date:  10/10/2014   ID:  Melanie [name], Melanie [name] 1946/05/07, MRN [id number]  PCP:  Melanie Redwood, MD  Cardiologist:   Melanie Fredrickson Wonda Cheng, MD   Chief Complaint  Patient presents with  . Hypertension      History of Present Illness: Melanie [name] is a 69 y.o. female who presents for   Problem List 1. HTN 2. Bradycardia 3. Hyperlipidemia 4. Depression 5. History of chest pain-cardiac catheterization in 2002 revealed normal coronary arteries and normal left ventricle systolic function 6. Former smoker-quit in November, 2012 7. Paroxysmal atrial fibrillation A. Type 2 diabetes mellitus  History of Present Illness: Melanie [name] is seen today for followup visit. She has history of hypertensive heart and bradycardia which has been improved with reduction of Cardizem. In general, she's been doing well and has adequate blood pressure control.  She feels fairly well but unfortunately she's gained a little bit of weight. She still is under tremendous stress taking care of her invalid husband at home. Unable to get a lot of regular exercise.  October 04, 2012: She is doing well. Unfortunately she started smoking again. She's not having episodes of chest pain or shortness breath. She has a history of paroxysmal atrial fibrillation in the past and has been maintained on metoprolol and diltiazem for long time. She informed that the cost of her diltiazem will be increasing since she'll need to find a replacement   December 09, 2013:  Melanie [name] is doing OK. She has been seen by Melanie [name] and Melanie [name] since I saw her. She had back surgery last year.  She's been out of her bisoprolol for the past 3 days which probably explains her fast heart rate.  Jan. 26, 2016:   Melanie [name] is seen today .   Has not had any palpitations.  Seems to be doing well.   BP has been ok Has gained some weight.  Still smoking   Past Medical History  Diagnosis Date  . HTN (hypertension)   .  Bradycardia   . DM (diabetes mellitus)   . Situational stress   . PAF (paroxysmal atrial fibrillation)   . Family history of anesthesia complication     mother had problems, but unsure of what they were  . GERD (gastroesophageal reflux disease)   . Arthritis     Past Surgical History  Procedure Laterality Date  . Abdominal hysterectomy    . Knee arthroscopy Bilateral 2007    aug and oct of 2007  . Carpal tunnel release Bilateral 2003  . Back surgery  2004  . Appendectomy    . Abdominal hysterectomy    . Joint replacement      both knees  . Colonoscopy    . Lumbar laminectomy N/A 07/20/2013    Procedure: MICRODISCECTOMY LUMBAR LAMINECTOMY;  Surgeon: Melanie Killings, MD;  Location: [facility];  Service: Orthopedics;  Laterality: N/A;  L2-3 Decompression     Current Outpatient Prescriptions  Medication Sig Dispense Refill  . aspirin EC 81 MG tablet Take 81 mg by mouth daily.    . bisoprolol-hydrochlorothiazide (ZIAC) 5-6.25 MG per tablet Take 1 tablet by mouth 2 (two) times daily. 180 tablet 1  . gabapentin (NEURONTIN) 300 MG capsule Take 1 capsule (300 mg total) by mouth 3 (three) times daily. 850 capsule 3  . glipiZIDE (GLUCOTROL) 5 MG tablet Take 5 mg by mouth 2 (two) times daily before a meal.    . lisinopril (PRINIVIL,ZESTRIL) 20 MG tablet  Take 1 tablet (20 mg total) by mouth daily. 90 tablet 3  . metFORMIN (GLUCOPHAGE) 500 MG tablet Take 500 mg by mouth 2 (two) times daily with a meal.    . Misc Natural Products (OSTEO BI-FLEX ADV TRIPLE ST PO) Take by mouth daily.    . nortriptyline (PAMELOR) 10 MG capsule Take 1 capsule (10 mg total) by mouth at bedtime. 30 capsule 3  . sertraline (ZOLOFT) 50 MG tablet Take 50 mg by mouth daily.     No current facility-administered medications for this visit.    Allergies:   Metoprolol    Social History:  The patient  reports that she has been smoking.  She does not have any smokeless tobacco history on file. She reports that she does not  drink alcohol or use illicit drugs.   Family History:  The patient's family history includes Heart attack in her father; Hypertension in her mother and sister; Multiple sclerosis in her sister; Neuropathy in her mother and sister; Other in her mother; Ovarian cancer in her sister; Stroke in her sister.    ROS:  Please see the history of present illness.   Otherwise, review of systems are positive for none.   All other systems are reviewed and negative.    PHYSICAL EXAM: VS:  BP 130/70 mmHg  [name] 69  Ht 5\' 7"  (1.702 m)  Wt 208 lb (94.348 kg)  BMI 32.57 kg/m2 , BMI Body mass index is 32.57 kg/(m^2). GEN: Well nourished, well developed, in no acute distress HEENT: normal Neck: no JVD, carotid bruits, or masses Cardiac: RRR; no murmurs, rubs, or gallops,no edema  Respiratory:  clear to auscultation bilaterally, normal work of breathing GI: soft, nontender, nondistended, + BS MS: no deformity or atrophy Skin: warm and dry, no rash Neuro:  Strength and sensation are intact Psych: normal   EKG:  EKG is ordered today. The ekg ordered today demonstrates NSR at 69.  NS ST abn.    Recent Labs: No results found for requested labs within last 365 days.    Lipid Panel No results found for: CHOL, TRIG, HDL, CHOLHDL, VLDL, LDLCALC, LDLDIRECT    Wt Readings from Last 3 Encounters:  10/10/14 208 lb (94.348 kg)  09/04/14 206 lb 7 oz (93.639 kg)  03/13/14 200 lb 4 oz (90.833 kg)      Other studies Reviewed: Additional studies/ records that were reviewed today include: . Review of the above records demonstrates:    ASSESSMENT AND PLAN:  1. HTN - BP is well controlled. Continue same meds.  2. Bradycardia - resolved,  HR is normal   3. Hyperlipidemia- labs are monitored by Dr. Brigitte [name].  4. History of chest pain-cardiac catheterization in 2002 revealed normal coronary arteries and normal left ventricle systolic function 5. Smoker - I've recommended that she try several of the  nicotine replacements.   6. Paroxysmal atrial fibrillation-  She has maintained NSR  7. Type 2 diabetes mellitus - followed by Dr. Brigitte [name].   Current medicines are reviewed at length with the patient today.  The patient does not have concerns regarding medicines.  The following changes have been made:  no change  Labs/ tests ordered today include:  No orders of the defined types were placed in this encounter.     Disposition:   FU with me in 1 year.     Signed, Rayleigh Gillyard, Wonda Cheng, MD  10/10/2014 3:24 PM    [place],  [place]  [address] Phone: [phone]; Fax: [phone]

## 2014-10-10 NOTE — Patient Instructions (Signed)
Your physician recommends that you continue on your current medications as directed. Please refer to the Current Medication list given to you today.    Your physician wants you to follow-up in: [place] will receive a reminder letter in the mail two months in advance. If you don't receive a letter, please call our office to schedule the follow-up appointment.

## 2014-10-31 NOTE — Telephone Encounter (Signed)
error 

## 2014-11-03 ENCOUNTER — Ambulatory Visit (INDEPENDENT_AMBULATORY_CARE_PROVIDER_SITE_OTHER): Payer: Medicare Other | Admitting: *Deleted

## 2014-11-03 DIAGNOSIS — E785 Hyperlipidemia, unspecified: Secondary | ICD-10-CM | POA: Diagnosis not present

## 2014-11-03 DIAGNOSIS — E669 Obesity, unspecified: Secondary | ICD-10-CM | POA: Diagnosis not present

## 2014-11-03 DIAGNOSIS — E538 Deficiency of other specified B group vitamins: Secondary | ICD-10-CM | POA: Diagnosis not present

## 2014-11-03 DIAGNOSIS — E781 Pure hyperglyceridemia: Secondary | ICD-10-CM | POA: Diagnosis not present

## 2014-11-03 DIAGNOSIS — Z6833 Body mass index (BMI) 33.0-33.9, adult: Secondary | ICD-10-CM | POA: Diagnosis not present

## 2014-11-03 DIAGNOSIS — E1149 Type 2 diabetes mellitus with other diabetic neurological complication: Secondary | ICD-10-CM | POA: Diagnosis not present

## 2014-11-03 DIAGNOSIS — F329 Major depressive disorder, single episode, unspecified: Secondary | ICD-10-CM | POA: Diagnosis not present

## 2014-11-03 DIAGNOSIS — Z23 Encounter for immunization: Secondary | ICD-10-CM | POA: Diagnosis not present

## 2014-11-03 DIAGNOSIS — G629 Polyneuropathy, unspecified: Secondary | ICD-10-CM | POA: Diagnosis not present

## 2014-11-03 DIAGNOSIS — E1129 Type 2 diabetes mellitus with other diabetic kidney complication: Secondary | ICD-10-CM | POA: Diagnosis not present

## 2014-11-03 DIAGNOSIS — N183 Chronic kidney disease, stage 3 (moderate): Secondary | ICD-10-CM | POA: Diagnosis not present

## 2014-11-03 DIAGNOSIS — I10 Essential (primary) hypertension: Secondary | ICD-10-CM | POA: Diagnosis not present

## 2014-11-03 MED ORDER — CYANOCOBALAMIN 1000 MCG/ML IJ SOLN
1000.0000 ug | Freq: Once | INTRAMUSCULAR | Status: AC
  Administered 2014-11-03: 1000 ug via INTRAMUSCULAR

## 2014-11-03 NOTE — Progress Notes (Signed)
Patient in for B12 injection. 

## 2014-11-10 ENCOUNTER — Ambulatory Visit: Payer: Medicare Other

## 2014-11-14 DIAGNOSIS — M7062 Trochanteric bursitis, left hip: Secondary | ICD-10-CM | POA: Diagnosis not present

## 2014-11-22 DIAGNOSIS — Z6833 Body mass index (BMI) 33.0-33.9, adult: Secondary | ICD-10-CM | POA: Diagnosis not present

## 2014-11-22 DIAGNOSIS — E1129 Type 2 diabetes mellitus with other diabetic kidney complication: Secondary | ICD-10-CM | POA: Diagnosis not present

## 2014-11-22 DIAGNOSIS — I10 Essential (primary) hypertension: Secondary | ICD-10-CM | POA: Diagnosis not present

## 2014-12-04 ENCOUNTER — Ambulatory Visit (INDEPENDENT_AMBULATORY_CARE_PROVIDER_SITE_OTHER): Payer: Medicare Other | Admitting: *Deleted

## 2014-12-04 DIAGNOSIS — E538 Deficiency of other specified B group vitamins: Secondary | ICD-10-CM | POA: Diagnosis not present

## 2014-12-04 MED ORDER — CYANOCOBALAMIN 1000 MCG/ML IJ SOLN
1000.0000 ug | Freq: Once | INTRAMUSCULAR | Status: AC
  Administered 2014-12-04: 1000 ug via INTRAMUSCULAR

## 2014-12-04 NOTE — Progress Notes (Signed)
Patient in for B12 injection. 

## 2014-12-18 ENCOUNTER — Other Ambulatory Visit: Payer: Self-pay | Admitting: Neurology

## 2014-12-19 DIAGNOSIS — R112 Nausea with vomiting, unspecified: Secondary | ICD-10-CM | POA: Diagnosis not present

## 2014-12-19 DIAGNOSIS — R1013 Epigastric pain: Secondary | ICD-10-CM | POA: Diagnosis not present

## 2014-12-19 DIAGNOSIS — K760 Fatty (change of) liver, not elsewhere classified: Secondary | ICD-10-CM | POA: Diagnosis not present

## 2014-12-19 DIAGNOSIS — Z6832 Body mass index (BMI) 32.0-32.9, adult: Secondary | ICD-10-CM | POA: Diagnosis not present

## 2014-12-19 DIAGNOSIS — K8020 Calculus of gallbladder without cholecystitis without obstruction: Secondary | ICD-10-CM | POA: Diagnosis not present

## 2014-12-19 DIAGNOSIS — E1129 Type 2 diabetes mellitus with other diabetic kidney complication: Secondary | ICD-10-CM | POA: Diagnosis not present

## 2014-12-21 ENCOUNTER — Ambulatory Visit: Payer: Self-pay | Admitting: Surgery

## 2014-12-21 DIAGNOSIS — K8020 Calculus of gallbladder without cholecystitis without obstruction: Secondary | ICD-10-CM | POA: Diagnosis not present

## 2014-12-25 NOTE — Progress Notes (Signed)
Abbreviations noted per consent form. Need clarification to consent w/o abbreviations. Thanks. pts PAT appt on Tuesday April 08/17/2015.

## 2014-12-26 ENCOUNTER — Encounter (HOSPITAL_COMMUNITY)
Admission: RE | Admit: 2014-12-26 | Discharge: 2014-12-26 | Disposition: A | Discharge status: Home / Self Care | Payer: Medicare Other | Source: Ambulatory Visit | Attending: Surgery | Admitting: Surgery

## 2014-12-26 ENCOUNTER — Encounter (HOSPITAL_COMMUNITY): Payer: Self-pay

## 2014-12-26 ENCOUNTER — Other Ambulatory Visit: Payer: Self-pay | Admitting: Cardiovascular Disease

## 2014-12-26 ENCOUNTER — Encounter (HOSPITAL_COMMUNITY): Payer: Self-pay | Admitting: Surgery

## 2014-12-26 ENCOUNTER — Encounter (INDEPENDENT_AMBULATORY_CARE_PROVIDER_SITE_OTHER): Payer: Self-pay

## 2014-12-26 DIAGNOSIS — E119 Type 2 diabetes mellitus without complications: Secondary | ICD-10-CM | POA: Diagnosis not present

## 2014-12-26 DIAGNOSIS — Z7982 Long term (current) use of aspirin: Secondary | ICD-10-CM | POA: Diagnosis not present

## 2014-12-26 DIAGNOSIS — K8010 Calculus of gallbladder with chronic cholecystitis without obstruction: Secondary | ICD-10-CM | POA: Diagnosis not present

## 2014-12-26 DIAGNOSIS — F329 Major depressive disorder, single episode, unspecified: Secondary | ICD-10-CM | POA: Diagnosis not present

## 2014-12-26 DIAGNOSIS — K219 Gastro-esophageal reflux disease without esophagitis: Secondary | ICD-10-CM | POA: Diagnosis not present

## 2014-12-26 DIAGNOSIS — I10 Essential (primary) hypertension: Secondary | ICD-10-CM | POA: Diagnosis not present

## 2014-12-26 DIAGNOSIS — F17210 Nicotine dependence, cigarettes, uncomplicated: Secondary | ICD-10-CM | POA: Diagnosis not present

## 2014-12-26 DIAGNOSIS — Z90710 Acquired absence of both cervix and uterus: Secondary | ICD-10-CM | POA: Diagnosis not present

## 2014-12-26 DIAGNOSIS — E780 Pure hypercholesterolemia: Secondary | ICD-10-CM | POA: Diagnosis not present

## 2014-12-26 HISTORY — DX: Depression, unspecified: F32.A

## 2014-12-26 HISTORY — DX: Major depressive disorder, single episode, unspecified: F32.9

## 2014-12-26 HISTORY — DX: Polyneuropathy, unspecified: G62.9

## 2014-12-26 HISTORY — DX: Headache, unspecified: R51.9

## 2014-12-26 HISTORY — DX: Headache: R51

## 2014-12-26 LAB — CBC
HCT: 37.3 % (ref 36.0–46.0)
Hemoglobin: 12.3 g/dL (ref 12.0–15.0)
MCH: 32.5 pg (ref 26.0–34.0)
MCHC: 33 g/dL (ref 30.0–36.0)
MCV: 98.7 fL (ref 78.0–100.0)
Platelets: 241 10*3/uL (ref 150–400)
RBC: 3.78 MIL/uL — ABNORMAL LOW (ref 3.87–5.11)
RDW: 15.4 % (ref 11.5–15.5)
WBC: 9.3 10*3/uL (ref 4.0–10.5)

## 2014-12-26 NOTE — Progress Notes (Signed)
Quick Note:  These results are acceptable for scheduled surgery.  Melanie [name] M. Solan Vosler, MD, FACS [facility] Office: [phone]   ______ 

## 2014-12-26 NOTE — H&P (Signed)
General Surgery [facility]  Melanie [name] DOB: [birth year] Married / Language: English / Race: White Female  History of Present Illness Patient words: eval gallstones.  The patient is a 69 year old female who presents for evaluation of gall stones. Patient is referred by Avanell Shackleton, NP, and Dr. Marton Redwood at [facility] for evaluation of symptomatic cholelithiasis. Patient has experienced approximately 4-5 weeks of upper abdominal discomfort, nausea, and emesis. During this time she has had some changes made in her diabetic medication regimen. Patient was seen earlier this week because of inability to tolerate certain foods and persistent nausea. She was sent for an ultrasound of the abdomen which was performed on December 19, 2014. This demonstrated a 2 cm gallstone and fatty infiltration of the liver without acute inflammatory changes. Patient did have laboratory studies performed at the office of her primary care provider. This was reportedly normal. Patient is now referred for evaluation for cholecystectomy for management of symptomatic cholelithiasis in the setting of diabetes mellitus. Patient has had prior abdominal surgery with abdominal hysterectomy 1980 including appendectomy. There is a family history of cholecystectomy in the patient's mother. Of note, I took out the patient's husband's gallbladder several years ago. He has done well.   Other Problems Arthritis Back Pain Depression Diabetes Mellitus Gastroesophageal Reflux Disease Hemorrhoids High blood pressure Hypercholesterolemia  Past Surgical History  Appendectomy Hysterectomy (not due to cancer) - Partial Knee Surgery Bilateral. Oral Surgery Spinal Surgery - Lower Back  Diagnostic Studies History  Colonoscopy 5-10 years ago Mammogram within last year  Allergies  Metoprolol Succinate *BETA BLOCKERS*  Medication History  Aspirin EC (81MG   Tablet DR, Oral) Active. Bisoprolol-Hydrochlorothiazide (5-6.25MG  Tablet, Oral) Active. Neurontin (300MG  Capsule, Oral) Active. GlipiZIDE XL (5MG  Tablet ER 24HR, Oral) Active. Lisinopril (20MG  Tablet, Oral) Active. MetFORMIN HCl (500MG  Tablet, Oral) Active. Pamelor (10MG  Capsule, Oral) Active. Zoloft (50MG  Tablet, Oral) Active. Medications Reconciled  Social History Caffeine use Tea. No alcohol use No drug use Tobacco use Current every day smoker.  Family History  Alcohol Abuse Father. Arthritis Mother, Sister. Breast Cancer Sister. Cerebrovascular Accident Sister. Hypertension Mother, Sister. Ovarian Cancer Sister. Thyroid problems Sister.  Pregnancy / Birth History Age at menarche 48 years. Age of menopause 44-55 Gravida 2 Maternal age 59-25 Para 2  Review of Systems General Present- Appetite Loss, Fatigue and Weight Loss. Not Present- Chills, Fever, Night Sweats and Weight Gain. Skin Not Present- Change in Wart/Mole, Dryness, Hives, Jaundice, New Lesions, Non-Healing Wounds, Rash and Ulcer. HEENT Present- Seasonal Allergies and Wears glasses/contact lenses. Not Present- Earache, Hearing Loss, Hoarseness, Nose Bleed, Oral Ulcers, Ringing in the Ears, Sinus Pain, Sore Throat, Visual Disturbances and Yellow Eyes. Respiratory Not Present- Bloody sputum, Chronic Cough, Difficulty Breathing, Snoring and Wheezing. Breast Not Present- Breast Mass, Breast Pain, Nipple Discharge and Skin Changes. Cardiovascular Not Present- Chest Pain, Difficulty Breathing Lying Down, Leg Cramps, Palpitations, Rapid Heart Rate, Shortness of Breath and Swelling of Extremities. Gastrointestinal Present- Bloating, Chronic diarrhea, Excessive gas, Gets full quickly at meals, Hemorrhoids, Indigestion, Nausea and Vomiting. Not Present- Abdominal Pain, Bloody Stool, Change in Bowel Habits, Constipation, Difficulty Swallowing and Rectal Pain. Female Genitourinary Not Present-  Frequency, Nocturia, Painful Urination, Pelvic Pain and Urgency. Musculoskeletal Present- Joint Pain and Joint Stiffness. Not Present- Back Pain, Muscle Pain, Muscle Weakness and Swelling of Extremities. Neurological Not Present- Decreased Memory, Fainting, Headaches, Numbness, Seizures, Tingling, Tremor, Trouble walking and Weakness. Psychiatric Not Present- Anxiety, Bipolar, Change in Sleep Pattern, Depression, Fearful  and Frequent crying. Endocrine Not Present- Cold Intolerance, Excessive Hunger, Hair Changes, Heat Intolerance, Hot flashes and New Diabetes. Hematology Not Present- Easy Bruising, Excessive bleeding, Gland problems, HIV and Persistent Infections.   Vitals 12/21/2014 2:23 PM Weight: 199 lb Height: 67in Body Surface Area: 2.07 m Body Mass Index: 31.17 kg/m Temp.: 97.24F(Oral)  Pulse: 89 (Regular)  Resp.: 17 (Unlabored)  BP: 130/66 (Sitting, Left Arm, Standard)    Physical Exam  General - appears comfortable, no distress; not diaphorectic  HEENT - normocephalic; sclerae clear, gaze conjugate; mucous membranes moist, dentition good; voice normal  Neck - symmetric on extension; no palpable anterior or posterior cervical adenopathy; no palpable masses in the thyroid bed  Chest - clear bilaterally without rhonchi, rales, or wheeze  Cor - regular rhythm with normal rate; no significant murmur  Abd - soft without distension; mild tenderness to palpation in the epigastrium and right upper quadrant with voluntary guarding; no palpable mass  Ext - non-tender without significant edema or lymphedema  Neuro - grossly intact; no tremor    Assessment & Plan  CHOLELITHIASIS WITHOUT CHOLECYSTITIS (574.20  K80.20)  Patient presents with symptomatic cholelithiasis. In the setting of diabetes mellitus, I'm always concerned that the underlying disease process is more significant than is clinically evident. After discussion and review of her ultrasound report, I  have recommended proceeding with cholecystectomy in the immediate future. Patient and I discussed the procedure. We discussed the laparoscopic approach versus an open approach. We discussed the possibility of conversion to open surgery. I would plan on an overnight stay following her procedure. We discussed the possibility of other symptoms such as gastroesophageal reflux disease contributing to her clinical picture. She understands and wishes to proceed with cholecystectomy in the near future.  The risks and benefits of the procedure have been discussed at length with the patient. The patient understands the proposed procedure, potential alternative treatments, and the course of recovery to be expected. All of the patient's questions have been answered at this time. The patient wishes to proceed with surgery.  Earnstine Regal, MD, [facility] Office: [phone]

## 2014-12-26 NOTE — Patient Instructions (Addendum)
Melanie [name]  12/26/2014   Your procedure is scheduled on: Thursday 12/28/14  Report to [facility] Main  Entrance and follow signs to               [place] at 05:30AM.  Call this number if you have problems the morning of surgery [phone]   Remember:  Do not eat food or drink liquids :After Midnight.     Take these medicines the morning of surgery with A SIP OF WATER: gabapentin, nexium                               You may not have any metal on your body including hair pins and              piercings  Do not wear jewelry, make-up, lotions, powders or perfumes.             Do not wear nail polish.  Do not shave  48 hours prior to surgery.              Men may shave face and neck.  Do not bring valuables to the hospital. [place]  Contacts, dentures or bridgework may not be worn into surgery.  Leave suitcase in the car. After surgery it may be brought to your room.    _____________________________________________________________________             [facility] - Preparing for Surgery Before surgery, you can play an important role.  Because skin is not sterile, your skin needs to be as free of germs as possible.  You can reduce the number of germs on your skin by washing with CHG (chlorahexidine gluconate) soap before surgery.  CHG is an antiseptic cleaner which kills germs and bonds with the skin to continue killing germs even after washing. Please DO NOT use if you have an allergy to CHG or antibacterial soaps.  If your skin becomes reddened/irritated stop using the CHG and inform your nurse when you arrive at Short Stay. Do not shave (including legs and underarms) for at least 48 hours prior to the first CHG shower.  You may shave your face/neck. Please follow these instructions carefully:  1.  Shower with CHG Soap the night before surgery and the  morning of Surgery.  2.  If you choose to  wash your hair, wash your hair first as usual with your  normal  shampoo.  3.  After you shampoo, rinse your hair and body thoroughly to remove the  shampoo.                            4.  Use CHG as you would any other liquid soap.  You can apply chg directly  to the skin and wash                       Gently with a scrungie or clean washcloth.  5.  Apply the CHG Soap to your body ONLY FROM THE NECK DOWN.   Do not use on face/ open  Wound or open sores. Avoid contact with eyes, ears mouth and genitals (private parts).                       Wash face,  Genitals (private parts) with your normal soap.             6.  Wash thoroughly, paying special attention to the area where your surgery  will be performed.  7.  Thoroughly rinse your body with warm water from the neck down.  8.  DO NOT shower/wash with your normal soap after using and rinsing off  the CHG Soap.                9.  Pat yourself dry with a clean towel.            10.  Wear clean pajamas.            11.  Place clean sheets on your bed the night of your first shower and do not  sleep with pets. Day of Surgery : Do not apply any lotions/deodorants the morning of surgery.  Please wear clean clothes to the hospital/surgery center.  FAILURE TO FOLLOW THESE INSTRUCTIONS MAY RESULT IN THE CANCELLATION OF YOUR SURGERY PATIENT SIGNATURE_________________________________  NURSE SIGNATURE__________________________________  ________________________________________________________________________

## 2014-12-26 NOTE — Progress Notes (Signed)
CMET results 12/19/14 on chart, EKG 10/10/14 on EPIC

## 2014-12-28 ENCOUNTER — Ambulatory Visit (HOSPITAL_COMMUNITY): Payer: Medicare Other

## 2014-12-28 ENCOUNTER — Ambulatory Visit (HOSPITAL_COMMUNITY): Payer: Medicare Other | Admitting: Registered Nurse

## 2014-12-28 ENCOUNTER — Encounter (HOSPITAL_COMMUNITY): Payer: Self-pay | Admitting: *Deleted

## 2014-12-28 ENCOUNTER — Observation Stay (HOSPITAL_COMMUNITY)
Admission: RE | Admit: 2014-12-28 | Discharge: 2014-12-29 | Disposition: A | Discharge status: Home / Self Care | Payer: Medicare Other | Source: Ambulatory Visit | Attending: Surgery | Admitting: Surgery

## 2014-12-28 ENCOUNTER — Encounter (HOSPITAL_COMMUNITY): Admission: RE | Disposition: A | Discharge status: Home / Self Care | Payer: Self-pay | Source: Ambulatory Visit

## 2014-12-28 DIAGNOSIS — E780 Pure hypercholesterolemia: Secondary | ICD-10-CM | POA: Diagnosis not present

## 2014-12-28 DIAGNOSIS — Z90710 Acquired absence of both cervix and uterus: Secondary | ICD-10-CM | POA: Insufficient documentation

## 2014-12-28 DIAGNOSIS — E119 Type 2 diabetes mellitus without complications: Secondary | ICD-10-CM | POA: Insufficient documentation

## 2014-12-28 DIAGNOSIS — Z419 Encounter for procedure for purposes other than remedying health state, unspecified: Secondary | ICD-10-CM

## 2014-12-28 DIAGNOSIS — F329 Major depressive disorder, single episode, unspecified: Secondary | ICD-10-CM | POA: Insufficient documentation

## 2014-12-28 DIAGNOSIS — Z7982 Long term (current) use of aspirin: Secondary | ICD-10-CM | POA: Insufficient documentation

## 2014-12-28 DIAGNOSIS — I10 Essential (primary) hypertension: Secondary | ICD-10-CM | POA: Diagnosis not present

## 2014-12-28 DIAGNOSIS — K219 Gastro-esophageal reflux disease without esophagitis: Secondary | ICD-10-CM | POA: Diagnosis not present

## 2014-12-28 DIAGNOSIS — M1990 Unspecified osteoarthritis, unspecified site: Secondary | ICD-10-CM | POA: Diagnosis not present

## 2014-12-28 DIAGNOSIS — K8020 Calculus of gallbladder without cholecystitis without obstruction: Secondary | ICD-10-CM | POA: Diagnosis not present

## 2014-12-28 DIAGNOSIS — K8010 Calculus of gallbladder with chronic cholecystitis without obstruction: Principal | ICD-10-CM | POA: Insufficient documentation

## 2014-12-28 DIAGNOSIS — Z9889 Other specified postprocedural states: Secondary | ICD-10-CM | POA: Diagnosis not present

## 2014-12-28 DIAGNOSIS — F17210 Nicotine dependence, cigarettes, uncomplicated: Secondary | ICD-10-CM | POA: Insufficient documentation

## 2014-12-28 HISTORY — PX: CHOLECYSTECTOMY: SHX55

## 2014-12-28 LAB — GLUCOSE, CAPILLARY
Glucose-Capillary: 154 mg/dL — ABNORMAL HIGH (ref 70–99)
Glucose-Capillary: 169 mg/dL — ABNORMAL HIGH (ref 70–99)
Glucose-Capillary: 108 mg/dL — ABNORMAL HIGH (ref 70–99)
Glucose-Capillary: 143 mg/dL — ABNORMAL HIGH (ref 70–99)

## 2014-12-28 SURGERY — LAPAROSCOPIC CHOLECYSTECTOMY WITH INTRAOPERATIVE CHOLANGIOGRAM
Anesthesia: Monitor Anesthesia Care | Site: Abdomen

## 2014-12-28 MED ORDER — KCL IN DEXTROSE-NACL 20-5-0.45 MEQ/L-%-% IV SOLN
INTRAVENOUS | Status: DC
  Administered 2014-12-28: 12:00:00 via INTRAVENOUS
  Filled 2014-12-28 (×2): qty 1000

## 2014-12-28 MED ORDER — DEXAMETHASONE SODIUM PHOSPHATE 10 MG/ML IJ SOLN
INTRAMUSCULAR | Status: AC
  Filled 2014-12-28: qty 1

## 2014-12-28 MED ORDER — LISINOPRIL 20 MG PO TABS
20.0000 mg | ORAL_TABLET | Freq: Every day | ORAL | Status: DC
  Administered 2014-12-28: 20 mg via ORAL
  Filled 2014-12-28 (×3): qty 1

## 2014-12-28 MED ORDER — ONDANSETRON HCL 4 MG PO TABS: 4.0000 mg | ORAL_TABLET | Freq: Four times a day (QID) | ORAL | Status: DC | PRN

## 2014-12-28 MED ORDER — ROCURONIUM BROMIDE 100 MG/10ML IV SOLN
INTRAVENOUS | Status: AC
  Filled 2014-12-28: qty 1

## 2014-12-28 MED ORDER — BUPIVACAINE-EPINEPHRINE (PF) 0.25% -1:200000 IJ SOLN
INTRAMUSCULAR | Status: DC | PRN
  Administered 2014-12-28: 20 mL

## 2014-12-28 MED ORDER — 0.9 % SODIUM CHLORIDE (POUR BTL) OPTIME
TOPICAL | Status: DC | PRN
  Administered 2014-12-28: 1000 mL

## 2014-12-28 MED ORDER — BISOPROLOL FUMARATE 5 MG PO TABS
5.0000 mg | ORAL_TABLET | Freq: Once | ORAL | Status: AC
  Administered 2014-12-28: 5 mg via ORAL
  Filled 2014-12-28: qty 1

## 2014-12-28 MED ORDER — SUCCINYLCHOLINE CHLORIDE 20 MG/ML IJ SOLN
INTRAMUSCULAR | Status: DC | PRN
  Administered 2014-12-28: 100 mg via INTRAVENOUS

## 2014-12-28 MED ORDER — SODIUM CHLORIDE 0.9 % IJ SOLN
INTRAMUSCULAR | Status: AC
  Filled 2014-12-28: qty 10

## 2014-12-28 MED ORDER — SUFENTANIL CITRATE 50 MCG/ML IV SOLN
INTRAVENOUS | Status: DC | PRN
  Administered 2014-12-28 (×2): 10 ug via INTRAVENOUS
  Administered 2014-12-28 (×2): 15 ug via INTRAVENOUS

## 2014-12-28 MED ORDER — CEFAZOLIN SODIUM-DEXTROSE 2-3 GM-% IV SOLR
2.0000 g | INTRAVENOUS | Status: AC
  Administered 2014-12-28: 2 g via INTRAVENOUS

## 2014-12-28 MED ORDER — HYDROMORPHONE HCL 1 MG/ML IJ SOLN: 0.2500 mg | INTRAMUSCULAR | Status: DC | PRN

## 2014-12-28 MED ORDER — ACETAMINOPHEN 10 MG/ML IV SOLN
1000.0000 mg | Freq: Once | INTRAVENOUS | Status: AC
  Administered 2014-12-28: 1000 mg via INTRAVENOUS
  Filled 2014-12-28: qty 100

## 2014-12-28 MED ORDER — MIDAZOLAM HCL 2 MG/2ML IJ SOLN
INTRAMUSCULAR | Status: AC
  Filled 2014-12-28: qty 2

## 2014-12-28 MED ORDER — IOHEXOL 300 MG/ML  SOLN
INTRAMUSCULAR | Status: DC | PRN
  Administered 2014-12-28: 9.5 mL

## 2014-12-28 MED ORDER — ROCURONIUM BROMIDE 100 MG/10ML IV SOLN
INTRAVENOUS | Status: DC | PRN
  Administered 2014-12-28: 5 mg via INTRAVENOUS
  Administered 2014-12-28: 30 mg via INTRAVENOUS
  Administered 2014-12-28: 5 mg via INTRAVENOUS

## 2014-12-28 MED ORDER — PROMETHAZINE HCL 25 MG/ML IJ SOLN: 6.2500 mg | INTRAMUSCULAR | Status: DC | PRN

## 2014-12-28 MED ORDER — BISOPROLOL-HYDROCHLOROTHIAZIDE 5-6.25 MG PO TABS
1.0000 | ORAL_TABLET | Freq: Two times a day (BID) | ORAL | Status: DC
  Administered 2014-12-28: 1 via ORAL
  Filled 2014-12-28 (×4): qty 1

## 2014-12-28 MED ORDER — METFORMIN HCL 500 MG PO TABS
1000.0000 mg | ORAL_TABLET | Freq: Two times a day (BID) | ORAL | Status: DC
Start: 2014-12-30 — End: 2014-12-29
  Filled 2014-12-28: qty 2

## 2014-12-28 MED ORDER — PROPOFOL 10 MG/ML IV BOLUS
INTRAVENOUS | Status: AC
  Filled 2014-12-28: qty 20

## 2014-12-28 MED ORDER — SERTRALINE HCL 50 MG PO TABS
50.0000 mg | ORAL_TABLET | Freq: Every day | ORAL | Status: DC
  Administered 2014-12-28: 50 mg via ORAL
  Filled 2014-12-28 (×3): qty 1

## 2014-12-28 MED ORDER — HYDROCODONE-ACETAMINOPHEN 5-325 MG PO TABS
1.0000 | ORAL_TABLET | ORAL | Status: DC | PRN
  Administered 2014-12-28 – 2014-12-29 (×2): 2 via ORAL
  Filled 2014-12-28 (×2): qty 2

## 2014-12-28 MED ORDER — ONDANSETRON HCL 4 MG/2ML IJ SOLN
INTRAMUSCULAR | Status: AC
  Filled 2014-12-28: qty 2

## 2014-12-28 MED ORDER — ONDANSETRON HCL 4 MG/2ML IJ SOLN: 4.0000 mg | Freq: Four times a day (QID) | INTRAMUSCULAR | Status: DC | PRN

## 2014-12-28 MED ORDER — GABAPENTIN 300 MG PO CAPS
300.0000 mg | ORAL_CAPSULE | Freq: Three times a day (TID) | ORAL | Status: DC
  Administered 2014-12-28 (×2): 300 mg via ORAL
  Filled 2014-12-28 (×6): qty 1

## 2014-12-28 MED ORDER — LACTATED RINGERS IV SOLN
INTRAVENOUS | Status: DC | PRN
  Administered 2014-12-28 (×2): via INTRAVENOUS

## 2014-12-28 MED ORDER — MIDAZOLAM HCL 5 MG/5ML IJ SOLN
INTRAMUSCULAR | Status: DC | PRN
  Administered 2014-12-28 (×2): 1 mg via INTRAVENOUS

## 2014-12-28 MED ORDER — ACETAMINOPHEN 325 MG PO TABS: 650.0000 mg | ORAL_TABLET | ORAL | Status: DC | PRN

## 2014-12-28 MED ORDER — GLYCOPYRROLATE 0.2 MG/ML IJ SOLN
INTRAMUSCULAR | Status: DC | PRN
  Administered 2014-12-28: 0.6 mg via INTRAVENOUS

## 2014-12-28 MED ORDER — LIDOCAINE HCL (CARDIAC) 20 MG/ML IV SOLN
INTRAVENOUS | Status: AC
  Filled 2014-12-28: qty 5

## 2014-12-28 MED ORDER — CEFAZOLIN SODIUM-DEXTROSE 2-3 GM-% IV SOLR
INTRAVENOUS | Status: AC
  Filled 2014-12-28: qty 50

## 2014-12-28 MED ORDER — LIDOCAINE HCL (CARDIAC) 20 MG/ML IV SOLN
INTRAVENOUS | Status: DC | PRN
  Administered 2014-12-28: 75 mg via INTRAVENOUS
  Administered 2014-12-28: 25 mg via INTRATRACHEAL

## 2014-12-28 MED ORDER — GLYCOPYRROLATE 0.2 MG/ML IJ SOLN
INTRAMUSCULAR | Status: AC
  Filled 2014-12-28: qty 3

## 2014-12-28 MED ORDER — NEOSTIGMINE METHYLSULFATE 10 MG/10ML IV SOLN
INTRAVENOUS | Status: DC | PRN
  Administered 2014-12-28: 4 mg via INTRAVENOUS

## 2014-12-28 MED ORDER — NORTRIPTYLINE HCL 10 MG PO CAPS
10.0000 mg | ORAL_CAPSULE | Freq: Every day | ORAL | Status: DC
  Administered 2014-12-28: 10 mg via ORAL
  Filled 2014-12-28 (×3): qty 1

## 2014-12-28 MED ORDER — HYDROMORPHONE HCL 1 MG/ML IJ SOLN
1.0000 mg | INTRAMUSCULAR | Status: DC | PRN
  Administered 2014-12-28: 1 mg via INTRAVENOUS
  Filled 2014-12-28: qty 1

## 2014-12-28 MED ORDER — LABETALOL HCL 5 MG/ML IV SOLN
INTRAVENOUS | Status: DC | PRN
  Administered 2014-12-28: 2.5 mg via INTRAVENOUS
  Administered 2014-12-28: 5 mg via INTRAVENOUS

## 2014-12-28 MED ORDER — LACTATED RINGERS IR SOLN
Status: DC | PRN
  Administered 2014-12-28: 1000 mL

## 2014-12-28 MED ORDER — BUPIVACAINE-EPINEPHRINE (PF) 0.25% -1:200000 IJ SOLN
INTRAMUSCULAR | Status: AC
  Filled 2014-12-28: qty 30

## 2014-12-28 MED ORDER — NEOSTIGMINE METHYLSULFATE 10 MG/10ML IV SOLN
INTRAVENOUS | Status: AC
  Filled 2014-12-28: qty 1

## 2014-12-28 MED ORDER — SUFENTANIL CITRATE 50 MCG/ML IV SOLN
INTRAVENOUS | Status: AC
  Filled 2014-12-28: qty 1

## 2014-12-28 MED ORDER — ONDANSETRON HCL 4 MG/2ML IJ SOLN
INTRAMUSCULAR | Status: DC | PRN
  Administered 2014-12-28: 4 mg via INTRAVENOUS

## 2014-12-28 SURGICAL SUPPLY — 44 items
APL SKNCLS STERI-STRIP NONHPOA (GAUZE/BANDAGES/DRESSINGS) ×1
APPLIER CLIP ROT 10 11.4 M/L (STAPLE) ×3
APR CLP MED LRG 11.4X10 (STAPLE) ×1
BAG SPEC RTRVL LRG 6X4 10 (ENDOMECHANICALS) ×1
BENZOIN TINCTURE PRP APPL 2/3 (GAUZE/BANDAGES/DRESSINGS) ×3 IMPLANT
CABLE HIGH FREQUENCY MONO STRZ (ELECTRODE) ×3 IMPLANT
CHLORAPREP W/TINT 26ML (MISCELLANEOUS) ×3 IMPLANT
CLIP APPLIE ROT 10 11.4 M/L (STAPLE) ×1 IMPLANT
CLOSURE STERI-STRIP 1/4X4 (GAUZE/BANDAGES/DRESSINGS) ×2 IMPLANT
CLOSURE WOUND 1/2 X4 (GAUZE/BANDAGES/DRESSINGS) ×1
COVER MAYO STAND STRL (DRAPES) ×3 IMPLANT
DECANTER SPIKE VIAL GLASS SM (MISCELLANEOUS) ×3 IMPLANT
DRAPE C-ARM 42X120 X-RAY (DRAPES) ×3 IMPLANT
DRAPE LAPAROSCOPIC ABDOMINAL (DRAPES) ×3 IMPLANT
DRAPE UTILITY XL STRL (DRAPES) ×3 IMPLANT
ELECT REM PT RETURN 9FT ADLT (ELECTROSURGICAL) ×3
ELECTRODE REM PT RTRN 9FT ADLT (ELECTROSURGICAL) ×1 IMPLANT
GAUZE SPONGE 2X2 8PLY STRL LF (GAUZE/BANDAGES/DRESSINGS) ×1 IMPLANT
GLOVE BIOGEL PI IND STRL 6.5 (GLOVE) IMPLANT
GLOVE BIOGEL PI IND STRL 7.0 (GLOVE) IMPLANT
GLOVE BIOGEL PI INDICATOR 6.5 (GLOVE) ×2
GLOVE BIOGEL PI INDICATOR 7.0 (GLOVE) ×4
GLOVE SURG ORTHO 8.0 STRL STRW (GLOVE) ×3 IMPLANT
GLOVE SURG SS PI 7.5 STRL IVOR (GLOVE) ×2 IMPLANT
GOWN STRL REUS W/ TWL XL LVL3 (GOWN DISPOSABLE) IMPLANT
GOWN STRL REUS W/TWL XL LVL3 (GOWN DISPOSABLE) ×15 IMPLANT
HEMOSTAT SURGICEL 4X8 (HEMOSTASIS) IMPLANT
KIT BASIN OR (CUSTOM PROCEDURE TRAY) ×3 IMPLANT
NS IRRIG 1000ML POUR BTL (IV SOLUTION) ×3 IMPLANT
POUCH SPECIMEN RETRIEVAL 10MM (ENDOMECHANICALS) ×3 IMPLANT
SCISSORS LAP 5X35 DISP (ENDOMECHANICALS) ×3 IMPLANT
SET CHOLANGIOGRAPH MIX (MISCELLANEOUS) ×3 IMPLANT
SET IRRIG TUBING LAPAROSCOPIC (IRRIGATION / IRRIGATOR) ×3 IMPLANT
SLEEVE XCEL OPT CAN 5 100 (ENDOMECHANICALS) ×3 IMPLANT
SPONGE GAUZE 2X2 STER 10/PKG (GAUZE/BANDAGES/DRESSINGS) ×2
STRIP CLOSURE SKIN 1/2X4 (GAUZE/BANDAGES/DRESSINGS) ×2 IMPLANT
SUT MNCRL AB 4-0 PS2 18 (SUTURE) ×5 IMPLANT
TAPE CLOTH SURG 4X10 WHT LF (GAUZE/BANDAGES/DRESSINGS) ×2 IMPLANT
TOWEL OR 17X26 10 PK STRL BLUE (TOWEL DISPOSABLE) ×3 IMPLANT
TOWEL OR NON WOVEN STRL DISP B (DISPOSABLE) ×3 IMPLANT
TRAY LAPAROSCOPIC (CUSTOM PROCEDURE TRAY) ×3 IMPLANT
TROCAR BLADELESS OPT 5 100 (ENDOMECHANICALS) ×3 IMPLANT
TROCAR XCEL BLUNT TIP 100MML (ENDOMECHANICALS) ×3 IMPLANT
TROCAR XCEL NON-BLD 11X100MML (ENDOMECHANICALS) ×3 IMPLANT

## 2014-12-28 NOTE — Anesthesia Procedure Notes (Signed)
Procedure Name: Intubation Date/Time: 12/28/2014 7:27 AM Performed by: Lissa Morales Pre-anesthesia Checklist: Patient identified, Emergency Drugs available, Suction available and Patient being monitored Patient Re-evaluated:Patient Re-evaluated prior to inductionOxygen Delivery Method: Circle System Utilized Preoxygenation: Pre-oxygenation with 100% oxygen Intubation Type: IV induction Ventilation: Mask ventilation without difficulty Laryngoscope Size: Mac and 4 Grade View: Grade II Tube type: Oral Tube size: 7.5 mm Number of attempts: 1 Airway Equipment and Method: Stylet and Oral airway Placement Confirmation: ETT inserted through vocal cords under direct vision,  positive ETCO2 and breath sounds checked- equal and bilateral Secured at: 21 cm Tube secured with: Tape Dental Injury: Teeth and Oropharynx as per pre-operative assessment

## 2014-12-28 NOTE — Addendum Note (Signed)
Addendum  created 12/28/14 1053 by Lissa Morales, CRNA   Modules edited: Anesthesia Attestations, Anesthesia Blocks and Procedures, Clinical Notes   Clinical Notes:  File: [id number]

## 2014-12-28 NOTE — Anesthesia Preprocedure Evaluation (Addendum)
Anesthesia Evaluation  Patient identified by MRN, date of birth, ID band Patient awake    Airway Mallampati: II  TM Distance: >3 FB Neck ROM: Full    Dental   Pulmonary Current Smoker,  breath sounds clear to auscultation        Cardiovascular hypertension, Rhythm:Regular Rate:Normal     Neuro/Psych    GI/Hepatic GERD-  ,  Endo/Other  diabetes  Renal/GU      Musculoskeletal   Abdominal   Peds  Hematology   Anesthesia Other Findings   Reproductive/Obstetrics                            Anesthesia Physical Anesthesia Plan  ASA: III  Anesthesia Plan: MAC   Post-op Pain Management:    Induction: Intravenous  Airway Management Planned:   Additional Equipment:   Intra-op Plan:   Post-operative Plan:   Informed Consent:   Dental advisory given  Plan Discussed with: CRNA and Anesthesiologist  Anesthesia Plan Comments:         Anesthesia Quick Evaluation

## 2014-12-28 NOTE — Anesthesia Postprocedure Evaluation (Signed)
  Anesthesia Post-op Note  Patient: Melanie [name]  Procedure(s) Performed: Procedure(s): LAPAROSCOPIC CHOLECYSTECTOMY WITH INTRAOPERATIVE CHOLANGIOGRAM (N/A)  Patient Location: PACU  Anesthesia Type:General  Level of Consciousness: awake  Airway and Oxygen Therapy: Patient Spontanous Breathing  Post-op Pain: mild  Post-op Assessment: Post-op Vital signs reviewed  Post-op Vital Signs: Reviewed  Last Vitals:  Filed Vitals:   12/28/14 1000  BP:   Pulse: 69  Temp:   Resp: 14    Complications: No apparent anesthesia complications

## 2014-12-28 NOTE — Interval H&P Note (Signed)
History and Physical Interval Note:  12/28/2014 7:02 AM  Melanie [name]  has presented today for surgery, with the diagnosis of SYMPTOMATIC CHOLELITHIASIS.  The various methods of treatment have been discussed with the patient and family. After consideration of risks, benefits and other options for treatment, the patient has consented to    Procedure(s): LAPAROSCOPIC CHOLECYSTECTOMY WITH INTRAOPERATIVE CHOLANGIOGRAM (N/A) as a surgical intervention .    The patient's history has been reviewed, patient examined, no change in status, stable for surgery.  I have reviewed the patient's chart and labs.  Questions were answered to the patient's satisfaction.    Earnstine Regal, MD, [facility] Office: [place]

## 2014-12-28 NOTE — Op Note (Signed)
Procedure Note  Pre-operative Diagnosis:  Symptomatic cholelithiasis, chronic cholecystitis  Post-operative Diagnosis:  same  Surgeon:  Earnstine Regal, MD, FACS  Assistant:  Servando Snare, RNFA   Procedure:  Laparoscopic cholecystectomy with intra-operative cholangiography  Anesthesia:  General  Estimated Blood Loss:  minimal  Drains: none         Specimen: Gallbladder to pathology  Indications:  Patient is referred by Avanell Shackleton, NP, and Dr. Marton Redwood at [facility] for evaluation of symptomatic cholelithiasis. Patient has experienced approximately 4-5 weeks of upper abdominal discomfort, nausea, and emesis. During this time she has had some changes made in her diabetic medication regimen. Patient was seen earlier this week because of inability to tolerate certain foods and persistent nausea. She was sent for an ultrasound of the abdomen which was performed on December 19, 2014. This demonstrated a 2 cm gallstone and fatty infiltration of the liver without acute inflammatory changes. Patient did have laboratory studies performed at the office of her primary care provider. This was reportedly normal. Patient is now referred for evaluation for cholecystectomy for management of symptomatic cholelithiasis in the setting of diabetes mellitus. Patient has had prior abdominal surgery with abdominal hysterectomy 1980 including appendectomy. There is a family history of cholecystectomy in the patient's mother.   Procedure Details:  The patient was seen in the pre-op holding area. The risks, benefits, complications, treatment options, and expected outcomes were previously discussed with the patient. The patient agreed with the proposed plan and has signed the informed consent form.  The patient was brought to the Operating Room, identified as Melanie [name] and the procedure verified as laparoscopic cholecystectomy with intraoperative cholangiography. A "time out" was  completed and the above information confirmed.  The patient was placed in the supine position. Following induction of general anesthesia, the abdomen was prepped and draped in the usual aseptic fashion.  An incision was made in the skin near the umbilicus. The midline fascia was incised and the peritoneal cavity was entered and a Hasson canula was introduced under direct vision.  The Hasson canula was secured with a 0-Vicryl pursestring suture. Pneumoperitoneum was established with carbon dioxide. Additional trocars were introduced under direct vision along the right costal margin in the midline, mid-clavicular line, and anterior axillary line.   The gallbladder was identified and the fundus grasped and retracted cephalad. Adhesions were taken down bluntly and the electrocautery was utilized as needed, taking care not to injure any adjacent structures. The infundibulum was grasped and retracted laterally, exposing the peritoneum overlying the triangle of Calot. The peritoneum was incised and structures exposed with blunt dissection. The cystic duct was clearly identified, bluntly dissected circumferentially, and clipped at the neck of the gallbladder.  An incision was made in the cystic duct and the cholangiogram catheter introduced. The catheter was secured using an ligaclip.  Real-time cholangiography was performed using C-arm fluoroscopy.  There was rapid filling of a normal caliber common bile duct.  There was reflux of contrast into the left and right hepatic ductal systems.  There was free flow distally into the duodenum without filling defect or obstruction.  The catheter was removed from the peritoneal cavity.  The cystic duct was then ligated with surgical clips and divided. The cystic artery was identified, dissected circumferentially, ligated with ligaclips, and divided.  The gallbladder was dissected away from the liver bed using the electrocautery for hemostasis. The gallbladder was  completely removed from the liver and placed into an endocatch bag.  The gallbladder was removed in the endocatch bag through the umbilical port site and submitted to pathology for review.  The right upper quadrant was irrigated and the gallbladder bed was inspected. Hemostasis was achieved with the electrocautery.  Pneumoperitoneum was released after viewing removal of the trocars with good hemostasis noted. The umbilical wound was irrigated and the fascia was then closed with the pursestring suture.  Local anesthetic was infiltrated at all port sites. The skin incisions were closed with 4-0 Monocril subcuticular sutures and steri-strips and dressings were applied.  Instrument, sponge, and needle counts were correct at the conclusion of the case.  The patient was awakened from anesthesia and brought to the recovery room in stable condition.  The patient tolerated the procedure well.   Earnstine Regal, MD, [facility] Office: [phone]

## 2014-12-28 NOTE — Progress Notes (Signed)
PHARMACIST - PHYSICIAN COMMUNICATION DR:  Harlow Asa CONCERNING:  METFORMIN SAFE ADMINISTRATION POLICY  RECOMMENDATION:  Current safety recommendations include avoiding metformin for a minimum of 48 hours after the patient's exposure to intravenous contrast media.  DESCRIPTION:  The Pharmacy Committee has adopted a policy that restricts the use of metformin in hospitalized patients until all the contraindications to administration have been ruled out. Specific contraindications are: []  Serum creatinine ? 1.5 for males []  Serum creatinine ? 1.4 for females []  Shock, acute MI, sepsis, hypoxemia, dehydration [x]  Administration of intravenous iodinated contrast media []  Heart Failure patients with low EF []  Acute or chronic metabolic acidosis (including DKA)         Will schedule medication to resume 48 hours after administration of intravenous iodinated contrast media  Royetta Asal, PharmD 12/28/14 11:20

## 2014-12-28 NOTE — Transfer of Care (Signed)
Immediate Anesthesia Transfer of Care Note  Patient: Melanie [name]  Procedure(s) Performed: Procedure(s): LAPAROSCOPIC CHOLECYSTECTOMY WITH INTRAOPERATIVE CHOLANGIOGRAM (N/A)  Patient Location: PACU  Anesthesia Type:General  Level of Consciousness: awake, alert , patient cooperative and responds to stimulation  Airway & Oxygen Therapy: Patient Spontanous Breathing and Patient connected to face mask oxygen  Post-op Assessment: Report given to RN, Post -op Vital signs reviewed and stable and Patient moving all extremities X 4  Post vital signs: stable  Last Vitals:  Filed Vitals:   12/28/14 0527  BP: 126/72  Pulse: 88  Temp: 36.6 C  Resp: 18    Complications: No apparent anesthesia complications

## 2014-12-29 DIAGNOSIS — E780 Pure hypercholesterolemia: Secondary | ICD-10-CM | POA: Diagnosis not present

## 2014-12-29 DIAGNOSIS — F329 Major depressive disorder, single episode, unspecified: Secondary | ICD-10-CM | POA: Diagnosis not present

## 2014-12-29 DIAGNOSIS — K8010 Calculus of gallbladder with chronic cholecystitis without obstruction: Secondary | ICD-10-CM | POA: Diagnosis not present

## 2014-12-29 DIAGNOSIS — E119 Type 2 diabetes mellitus without complications: Secondary | ICD-10-CM | POA: Diagnosis not present

## 2014-12-29 DIAGNOSIS — I10 Essential (primary) hypertension: Secondary | ICD-10-CM | POA: Diagnosis not present

## 2014-12-29 DIAGNOSIS — K219 Gastro-esophageal reflux disease without esophagitis: Secondary | ICD-10-CM | POA: Diagnosis not present

## 2014-12-29 MED ORDER — HYDROCODONE-ACETAMINOPHEN 5-325 MG PO TABS: 1.0000 | ORAL_TABLET | ORAL | Status: DC | PRN

## 2014-12-29 NOTE — Progress Notes (Signed)
UR completed 

## 2014-12-29 NOTE — Discharge Summary (Signed)
Physician Discharge Summary [facility]  Patient ID: Melanie [name] MRN: [id number] DOB/AGE: 11-29-1945 69 y.o.  Admit date: 12/28/2014 Discharge date: 12/29/2014  Admission Diagnoses:  Symptomatic cholelithiasis, chronic cholecystitis  Discharge Diagnoses:  Principal Problem:   Cholelithiasis with cholecystitis Active Problems:   Cholelithiasis with chronic cholecystitis   Discharged Condition: good  Hospital Course: patient admitted after lap chole for observation.  Post op course uncomplicated.  Tolerating diet.  Pain controlled.  Prepared for discharge on POD#1.  Consults: None  Treatments: surgery: lap chole with IOC  Discharge Exam: Blood pressure 128/70, pulse 82, temperature 99.1 F (37.3 C), temperature source Oral, resp. rate 18, height 5\' 7"  (1.702 m), weight 90.719 kg (200 lb), SpO2 98 %. HEENT - clear Neck - soft Chest - clear bilaterally Cor - RRR Abd - soft, dressings dry and intact  Disposition: Home  Discharge Instructions    Diet - low sodium heart healthy    Complete by:  As directed      Discharge instructions    Complete by:  As directed   [facility]  LAPAROSCOPIC SURGERY:  POST-OP INSTRUCTIONS  Always review your discharge instruction sheet given to you by the facility where your surgery was performed.  A prescription for pain medication may be given to you upon discharge.  Take your pain medication as prescribed.  If narcotic pain medicine is not needed, then you may take acetaminophen (Tylenol) or ibuprofen (Advil) as needed.  Take your usually prescribed medications unless otherwise directed.  If you need a refill on your pain medication, please contact your pharmacy.  They will contact our office to request authorization. Prescriptions will not be filled after 5 P.M. or on weekends.  You should follow a light diet the first few days after arrival home, such as soup and crackers or toast.  Be sure  to include plenty of fluids daily.  Most patients will experience some swelling and bruising in the area of the incisions.  Ice packs will help.  Swelling and bruising can take several days to resolve.   It is common to experience some constipation after surgery.  Increasing fluid intake and taking a stool softener (such as Colace) will usually help or prevent this problem from occurring.  A mild laxative (Milk of Magnesia or Miralax) should be taken according to package instructions if there has been no bowel movement after 48 hours.  If you have steri-strips and a gauze dressing over your incision, you may remove the gauze bandage on the second day after surgery, and you may shower at that time.  Leave your steri-strips (small skin tapes) in place directly over the incision.  These strips should remain on the skin for 7-10 days and then be removed.  You may get them wet in the shower and pat them dry.  If you have Dermabond (topical glue) over your incision, you may shower at any time following your procedure.  Leave the glue in place for 10-14 days.  When it begins to flake off around the edges, you may remove it.  Any sutures or staples will be removed at the office during your follow-up visit.  ACTIVITIES:  You may resume regular (light) daily activities beginning the next day-such as daily self-care, walking, climbing stairs-gradually increasing activities as tolerated.  You may have sexual intercourse when it is comfortable.  Refrain from any heavy lifting or straining until approved by your doctor.  You may drive when you are  no longer taking prescription pain medication, you can comfortably wear a seatbelt, and you can safely maneuver your car and apply brakes.  You should see your doctor in the office for a follow-up appointment approximately 2-3 weeks after your surgery.  Make sure that you call for this appointment within a day or two after you arrive home to insure a convenient  appointment time.  WHEN TO CALL YOUR DOCTOR: Fever over 101.0 Inability to urinate Continued bleeding from incision Increased pain, redness, or drainage from the incision Increasing abdominal pain  The clinic staff is available to answer your questions during regular business hours.  Please don't hesitate to call and ask to speak to one of the nurses for clinical concerns.  If you have a medical emergency, go to the nearest emergency room or call 911.  A surgeon from [facility] is always on call for the hospital.  Earnstine Regal, MD, [facility] Office: [place] Free:  [place] [phone]  Website: [email or web address]     Increase activity slowly    Complete by:  As directed      Remove dressing in 24 hours    Complete by:  As directed             Medication List    TAKE these medications        aspirin EC 81 MG tablet  Take 81 mg by mouth every morning.     aspirin-acetaminophen-caffeine 250-250-65 MG per tablet  Commonly known as:  EXCEDRIN MIGRAINE  Take 1-2 tablets by mouth every 6 (six) hours as needed for headache.     bisoprolol-hydrochlorothiazide 5-6.25 MG per tablet  Commonly known as:  ZIAC  Take 1 tablet by mouth 2 (two) times daily.     esomeprazole 20 MG capsule  Commonly known as:  NEXIUM  Take 20 mg by mouth every morning.     Exenatide ER 2 MG Pen  Inject 1 each into the skin once a week. On Wed.     gabapentin 300 MG capsule  Commonly known as:  NEURONTIN  Take 1 capsule (300 mg total) by mouth 3 (three) times daily.     HYDROcodone-acetaminophen 5-325 MG per tablet  Commonly known as:  NORCO/VICODIN  Take 1-2 tablets by mouth every 4 (four) hours as needed for moderate pain.     lisinopril 20 MG tablet  Commonly known as:  PRINIVIL,ZESTRIL  TAKE ONE TABLET BY MOUTH ONCE DAILY     metFORMIN 1000 MG tablet  Commonly known as:  GLUCOPHAGE  Take 1,000 mg by mouth 2  (two) times daily.     nortriptyline 10 MG capsule  Commonly known as:  PAMELOR  TAKE ONE CAPSULE BY MOUTH AT BEDTIME     sertraline 50 MG tablet  Commonly known as:  ZOLOFT  Take 50 mg by mouth at bedtime.           Follow-up Information    Follow up with Earnstine Regal, MD. Schedule an appointment as soon as possible for a visit in 3 weeks.   Specialty:  General Surgery   Why:  For wound re-check   Contact information:   [place] [phone]       Earnstine Regal, MD, [facility] Office: [phone]   Signed: Earnstine Regal 12/29/2014, 7:55 AM

## 2015-01-01 ENCOUNTER — Encounter (HOSPITAL_COMMUNITY): Payer: Self-pay | Admitting: Surgery

## 2015-01-04 DIAGNOSIS — I10 Essential (primary) hypertension: Secondary | ICD-10-CM | POA: Diagnosis not present

## 2015-01-04 DIAGNOSIS — K8020 Calculus of gallbladder without cholecystitis without obstruction: Secondary | ICD-10-CM | POA: Diagnosis not present

## 2015-01-04 DIAGNOSIS — Z6831 Body mass index (BMI) 31.0-31.9, adult: Secondary | ICD-10-CM | POA: Diagnosis not present

## 2015-01-04 DIAGNOSIS — E1129 Type 2 diabetes mellitus with other diabetic kidney complication: Secondary | ICD-10-CM | POA: Diagnosis not present

## 2015-01-08 ENCOUNTER — Ambulatory Visit (INDEPENDENT_AMBULATORY_CARE_PROVIDER_SITE_OTHER): Payer: Medicare Other | Admitting: *Deleted

## 2015-01-08 DIAGNOSIS — E538 Deficiency of other specified B group vitamins: Secondary | ICD-10-CM

## 2015-01-08 MED ORDER — CYANOCOBALAMIN 1000 MCG/ML IJ SOLN
1000.0000 ug | Freq: Once | INTRAMUSCULAR | Status: AC
  Administered 2015-01-08: 1000 ug via INTRAMUSCULAR

## 2015-01-08 NOTE — Progress Notes (Signed)
Patient in for B12 injection. 

## 2015-01-18 ENCOUNTER — Other Ambulatory Visit: Payer: Self-pay | Admitting: Neurology

## 2015-01-18 NOTE — Telephone Encounter (Signed)
Rx sent 

## 2015-02-20 ENCOUNTER — Telehealth: Payer: Self-pay | Admitting: Neurology

## 2015-02-20 ENCOUNTER — Encounter: Payer: Self-pay | Admitting: *Deleted

## 2015-02-20 NOTE — Telephone Encounter (Signed)
Gabapentin dose is currently 3 tabs three times per day. Her pharmacy has the instructions set as 1 tab three times per day. Please call [facility] and update the directions so it doesn't appear that she is refilling too early. PT Cb# [phone] / Sherri S.

## 2015-02-20 NOTE — Telephone Encounter (Signed)
I called [facility] and left a message updating the directions.

## 2015-03-01 DIAGNOSIS — E1129 Type 2 diabetes mellitus with other diabetic kidney complication: Secondary | ICD-10-CM | POA: Diagnosis not present

## 2015-03-01 DIAGNOSIS — I10 Essential (primary) hypertension: Secondary | ICD-10-CM | POA: Diagnosis not present

## 2015-03-05 ENCOUNTER — Encounter: Payer: Self-pay | Admitting: Neurology

## 2015-03-05 ENCOUNTER — Ambulatory Visit (INDEPENDENT_AMBULATORY_CARE_PROVIDER_SITE_OTHER): Payer: Medicare Other | Admitting: Neurology

## 2015-03-05 VITALS — BP 120/70 | HR 98 | Ht 67.0 in | Wt 191.2 lb

## 2015-03-05 DIAGNOSIS — E538 Deficiency of other specified B group vitamins: Secondary | ICD-10-CM | POA: Diagnosis not present

## 2015-03-05 DIAGNOSIS — E1142 Type 2 diabetes mellitus with diabetic polyneuropathy: Secondary | ICD-10-CM

## 2015-03-05 DIAGNOSIS — E0842 Diabetes mellitus due to underlying condition with diabetic polyneuropathy: Secondary | ICD-10-CM

## 2015-03-05 MED ORDER — NORTRIPTYLINE HCL 10 MG PO CAPS: 20.0000 mg | ORAL_CAPSULE | Freq: Every day | ORAL | Status: DC

## 2015-03-05 NOTE — Progress Notes (Signed)
Follow-up Visit   Date: 03/05/2015    Melanie [name] MRN: [id number] DOB: [birth year]   Interim History: Melanie [name] is a 69 y.o. right-handed Caucasian female with hypertension, bradycardia, diabetes mellitus, paroxysmal atrial fibrillation, GERD, and L2-L3 disc protrusion s/p decompression (07/2013) returning to the clinic for follow-up of diabetic neuropathy.   History of present illness: Starting in the Spring of 2014, she developed sharp radiating pain down her low back and legs and was found to have L2-3 disc protrusion. She underwent L2-L3 decompression in November 2014 which resolved her radiating back pain, but she has noticed numbness and tingling/stabbing pain over bilateral feet. When she severe, she feels as if there is "sand paper" on her feet. Symptoms are constant, worse in the evening. She was taking vicodin for her back pain which helped her feet symptoms. Prolonged standing and tight shoes exacerbates pain. Numbness and tingling is localized to her feet. Of note, she complains of numbness over the right lateral surface of the thigh which has been there since she was pregnant with her son.   - Follow-up 10/06/2013:   After increasing her neurontin to 933m TID, she noticed an improvement in her painful paresthesias where it was nearly resolved.    - Follow-up 01/12/2014:  She was doing well on neurontin 9058mTID until late March, but then developed worsening painful parethesias which travels from her feet to mild calf.  She has been getting B12 injections and reports an improvement in her energy level because she is less fatigued.   - Follow-up 03/13/2014:  She is getting about 75% reduction with gabapentin 90020mID.  Worsening of symptoms at her last visit is most likely due to life stressors.    - UPDATE 09/04/2014:  She is doing great with her diabetes and says her last HbA1c was ~5%.  She continues to get benefit from gabapentin 900m86mD, but has intermittent  bad days where nothing helps her pain.  When she takes an extra gabapentin, she gets very sleepy.    - UDPATE 03/05/2015:  She continues to have burning paresthesias of her feet which is worse at night, but daytime pain is well-controlled on her current regime of gabapentin 900mg28m.  Since starting nortriptyline 10mg 4m she got several months of relief, but then started having worsening pain pain.  Diabetes is doing well with last HbA1c 6.4.  She is still smoking and not interested in quitting.  She had her gall bladder removed in April, otherwise no hospitalizations or illnesses/  Medications:  Current Outpatient Prescriptions on File Prior to Visit  Medication Sig Dispense Refill  . aspirin EC 81 MG tablet Take 81 mg by mouth every morning.     . aspiMarland Kitchenin-acetaminophen-caffeine (EXCEDRIN MIGRAINE) 250-250-65 MG per tablet Take 1-2 tablets by mouth every 6 (six) hours as needed for headache.    . bisoprolol-hydrochlorothiazide (ZIAC) 5-6.25 MG per tablet Take 1 tablet by mouth 2 (two) times daily. 180 tablet 3  . esomeprazole (NEXIUM) 20 MG capsule Take 20 mg by mouth every morning.    . Exenatide ER 2 MG PEN Inject 1 each into the skin once a week. On Wed.    . gabaMarland Kitchenentin (NEURONTIN) 300 MG capsule Take 900 mg by mouth 3 (three) times daily. Take 3 pills 3 x a day.    . HYDRMarland Kitchencodone-acetaminophen (NORCO/VICODIN) 5-325 MG per tablet Take 1-2 tablets by mouth every 4 (four) hours as needed for moderate pain. 20 tablet 0  .  lisinopril (PRINIVIL,ZESTRIL) 20 MG tablet TAKE ONE TABLET BY MOUTH ONCE DAILY 90 tablet 0  . metFORMIN (GLUCOPHAGE) 1000 MG tablet Take 1,000 mg by mouth 2 (two) times daily.    . sertraline (ZOLOFT) 50 MG tablet Take 50 mg by mouth at bedtime.      No current facility-administered medications on file prior to visit.    Allergies:  Allergies  Allergen Reactions  . Fenofibrate Other (See Comments)    "muscle spasms"  . Metoprolol Other (See Comments)    REACTION:  Headache    Review of Systems:  CONSTITUTIONAL: No fevers, chills, night sweats, or weight loss.   EYES: No visual changes or eye pain ENT: No hearing changes.  No history of nose bleeds.   RESPIRATORY: No cough, wheezing and shortness of breath.   CARDIOVASCULAR: Negative for chest pain, and palpitations.   GI: Negative for abdominal discomfort, blood in stools or black stools.  No recent change in bowel habits.   GU:  No history of incontinence.   MUSCLOSKELETAL: No history of joint pain or swelling.  No myalgias.   SKIN: Negative for lesions, rash, and itching.   ENDOCRINE: Negative for cold or heat intolerance, polydipsia or goiter.   PSYCH:  +depression or anxiety symptoms.   NEURO: As Above.   Vital Signs:  BP 120/70 mmHg  Pulse 98  Ht '5\' 7"'  (1.702 m)  Wt 191 lb 3 oz (86.722 kg)  BMI 29.94 kg/m2  SpO2 97%  Neurological Exam: MENTAL STATUS including orientation to time, place, person, recent and remote memory, attention span and concentration, language, and fund of knowledge is normal.    CRANIAL NERVES:  Pupils equal round and reactive to light.  Normal conjugate, extra-ocular eye movements in all directions of gaze.  Face is symmetric.   MOTOR:  Motor strength is 5/5 in all extremities, including distally in the legs.    MSRs:  Reflexes are 2+/4 in the upper extremities, 1+/4 at the patella and Achilles bilaterally.  SENSORY:  Vibration and pin prick is diminished to 50% at her ankles and pin prick is absent at toes.  Rhomberg is negative.   COORDINATION/GAIT:  Gait narrow based and stable.  Unsteady with tandem gait.   Data: Labs 09/22/2013:  Copper 85, B12 208*, TSH 0.40, SPEP/UPEP with IFE:  No M protein HbA1c 6.4  02/2015  MRI lumbar spine 03/15/2013:  1. Lumbar spondylosis and degenerative disc disease cause moderate impingement at L2-3 and L4-5, and mild impingement at L3-4 as detailed above.  2. 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 I doubt that  this is a pheochromocytoma given the intermediate T2 signal characteristics, but otherwise this mass is nonspecific (although statistically most likely to represent an adenoma). If the patient has a history of lung cancer or if further workup is warranted, MRI of the adrenal glands with and without contrast provides the greatest diagnostic specificity.  3. Atrophic left kidney.   IMPRESSION/PLAN: 1.  Distal and symmetric peripheral neuropathy due to diabetes  - Exam is stable.  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  Vitamin B12 deficiency  - Completed 1 year injection treatments  - Start  vitamin B12 1020mg daily  - Check vitamin B12 level at next visit 3.  Lumbar spondylosis with moderate impingement at L2-3 and L4-5 s/p decompression (November 2014) 4.  Diabetes mellitus (HbA1c 6.4), well-controlled 5.  Smoking cessation instruction/counseling given 6.  Return to clinic in 634-month  The duration of this appointment visit was 25 minutes of face-to-face time with the patient.  Greater than 50% of this time was spent in counseling, explanation of diagnosis, planning of further management, and coordination of care.   Thank you for allowing me to participate in patient's care.  If I can answer any additional questions, I would be pleased to do so.    Sincerely,    Danni Shima K. PaPosey ProntoDO

## 2015-03-05 NOTE — Patient Instructions (Addendum)
1.  Start vitamin B12 1055mcg daily 2.  Increase nortriptyline to 20mg  (2 tablets) at bedtime 3.  Continue gabapentin 900mg  three times daily 4.  Try to cut back on smoking 5.  Return to clinic in January 2017

## 2015-03-06 ENCOUNTER — Ambulatory Visit: Payer: Medicare Other | Admitting: Neurology

## 2015-03-22 ENCOUNTER — Other Ambulatory Visit: Payer: Self-pay | Admitting: Cardiovascular Disease

## 2015-03-27 DIAGNOSIS — Z9049 Acquired absence of other specified parts of digestive tract: Secondary | ICD-10-CM | POA: Diagnosis not present

## 2015-03-27 DIAGNOSIS — Z6831 Body mass index (BMI) 31.0-31.9, adult: Secondary | ICD-10-CM | POA: Diagnosis not present

## 2015-03-27 DIAGNOSIS — E785 Hyperlipidemia, unspecified: Secondary | ICD-10-CM | POA: Diagnosis not present

## 2015-03-27 DIAGNOSIS — I10 Essential (primary) hypertension: Secondary | ICD-10-CM | POA: Diagnosis not present

## 2015-03-27 DIAGNOSIS — Z1389 Encounter for screening for other disorder: Secondary | ICD-10-CM | POA: Diagnosis not present

## 2015-03-27 DIAGNOSIS — E781 Pure hyperglyceridemia: Secondary | ICD-10-CM | POA: Diagnosis not present

## 2015-03-27 DIAGNOSIS — E1129 Type 2 diabetes mellitus with other diabetic kidney complication: Secondary | ICD-10-CM | POA: Diagnosis not present

## 2015-03-27 DIAGNOSIS — E1149 Type 2 diabetes mellitus with other diabetic neurological complication: Secondary | ICD-10-CM | POA: Diagnosis not present

## 2015-03-27 DIAGNOSIS — L309 Dermatitis, unspecified: Secondary | ICD-10-CM | POA: Diagnosis not present

## 2015-04-06 ENCOUNTER — Encounter: Payer: Self-pay | Admitting: Gastroenterology

## 2015-05-07 DIAGNOSIS — H2513 Age-related nuclear cataract, bilateral: Secondary | ICD-10-CM | POA: Diagnosis not present

## 2015-05-07 DIAGNOSIS — E119 Type 2 diabetes mellitus without complications: Secondary | ICD-10-CM | POA: Diagnosis not present

## 2015-05-30 ENCOUNTER — Other Ambulatory Visit: Payer: Self-pay | Admitting: *Deleted

## 2015-05-30 ENCOUNTER — Telehealth: Payer: Self-pay | Admitting: Neurology

## 2015-05-30 MED ORDER — GABAPENTIN 300 MG PO CAPS: 900.0000 mg | ORAL_CAPSULE | Freq: Three times a day (TID) | ORAL | Status: DC

## 2015-05-30 NOTE — Telephone Encounter (Signed)
Pt called to request refill of Gabapentin/ 300 mg x3 qd/ to be sent to [facility]/call back @/ [phone]

## 2015-05-30 NOTE — Telephone Encounter (Signed)
Pt called for refill request of med

## 2015-05-30 NOTE — Telephone Encounter (Signed)
Rx sent 

## 2015-06-01 DIAGNOSIS — M25561 Pain in right knee: Secondary | ICD-10-CM | POA: Diagnosis not present

## 2015-06-05 DIAGNOSIS — R262 Difficulty in walking, not elsewhere classified: Secondary | ICD-10-CM | POA: Diagnosis not present

## 2015-06-05 DIAGNOSIS — M25561 Pain in right knee: Secondary | ICD-10-CM | POA: Diagnosis not present

## 2015-06-07 DIAGNOSIS — M25561 Pain in right knee: Secondary | ICD-10-CM | POA: Diagnosis not present

## 2015-06-07 DIAGNOSIS — R262 Difficulty in walking, not elsewhere classified: Secondary | ICD-10-CM | POA: Diagnosis not present

## 2015-06-12 DIAGNOSIS — M25561 Pain in right knee: Secondary | ICD-10-CM | POA: Diagnosis not present

## 2015-06-12 DIAGNOSIS — R262 Difficulty in walking, not elsewhere classified: Secondary | ICD-10-CM | POA: Diagnosis not present

## 2015-06-14 DIAGNOSIS — R262 Difficulty in walking, not elsewhere classified: Secondary | ICD-10-CM | POA: Diagnosis not present

## 2015-06-14 DIAGNOSIS — M25561 Pain in right knee: Secondary | ICD-10-CM | POA: Diagnosis not present

## 2015-06-19 DIAGNOSIS — M25561 Pain in right knee: Secondary | ICD-10-CM | POA: Diagnosis not present

## 2015-06-19 DIAGNOSIS — R262 Difficulty in walking, not elsewhere classified: Secondary | ICD-10-CM | POA: Diagnosis not present

## 2015-06-21 DIAGNOSIS — R262 Difficulty in walking, not elsewhere classified: Secondary | ICD-10-CM | POA: Diagnosis not present

## 2015-06-21 DIAGNOSIS — M25561 Pain in right knee: Secondary | ICD-10-CM | POA: Diagnosis not present

## 2015-06-22 DIAGNOSIS — R109 Unspecified abdominal pain: Secondary | ICD-10-CM | POA: Diagnosis not present

## 2015-06-22 DIAGNOSIS — N200 Calculus of kidney: Secondary | ICD-10-CM | POA: Diagnosis not present

## 2015-06-22 DIAGNOSIS — M545 Low back pain: Secondary | ICD-10-CM | POA: Diagnosis not present

## 2015-06-25 ENCOUNTER — Telehealth: Payer: Self-pay | Admitting: Neurology

## 2015-06-25 ENCOUNTER — Other Ambulatory Visit: Payer: Self-pay | Admitting: *Deleted

## 2015-06-25 MED ORDER — GABAPENTIN 300 MG PO CAPS: 900.0000 mg | ORAL_CAPSULE | Freq: Three times a day (TID) | ORAL | Status: DC

## 2015-06-25 NOTE — Telephone Encounter (Signed)
PT called in regards to her medication Gabapentin/Dawn CB# [phone]

## 2015-06-25 NOTE — Telephone Encounter (Signed)
Called patient back and got vm.. Left message for her to call me back.

## 2015-06-25 NOTE — Telephone Encounter (Signed)
Patient said that her neurontin bottle says to take 1 pill tid when it should say 3 pills tid.  Sent new Rx to pharmacy with the correct instructions.

## 2015-06-26 DIAGNOSIS — M25561 Pain in right knee: Secondary | ICD-10-CM | POA: Diagnosis not present

## 2015-06-26 DIAGNOSIS — R262 Difficulty in walking, not elsewhere classified: Secondary | ICD-10-CM | POA: Diagnosis not present

## 2015-06-28 DIAGNOSIS — M25561 Pain in right knee: Secondary | ICD-10-CM | POA: Diagnosis not present

## 2015-06-28 DIAGNOSIS — R262 Difficulty in walking, not elsewhere classified: Secondary | ICD-10-CM | POA: Diagnosis not present

## 2015-07-02 DIAGNOSIS — M25561 Pain in right knee: Secondary | ICD-10-CM | POA: Diagnosis not present

## 2015-07-02 DIAGNOSIS — R262 Difficulty in walking, not elsewhere classified: Secondary | ICD-10-CM | POA: Diagnosis not present

## 2015-07-04 DIAGNOSIS — M25561 Pain in right knee: Secondary | ICD-10-CM | POA: Diagnosis not present

## 2015-07-05 DIAGNOSIS — R262 Difficulty in walking, not elsewhere classified: Secondary | ICD-10-CM | POA: Diagnosis not present

## 2015-07-05 DIAGNOSIS — M25561 Pain in right knee: Secondary | ICD-10-CM | POA: Diagnosis not present

## 2015-07-06 ENCOUNTER — Other Ambulatory Visit: Payer: Self-pay | Admitting: Internal Medicine

## 2015-07-06 ENCOUNTER — Other Ambulatory Visit: Payer: Self-pay

## 2015-07-06 DIAGNOSIS — Z1231 Encounter for screening mammogram for malignant neoplasm of breast: Secondary | ICD-10-CM

## 2015-07-09 DIAGNOSIS — R262 Difficulty in walking, not elsewhere classified: Secondary | ICD-10-CM | POA: Diagnosis not present

## 2015-07-09 DIAGNOSIS — M25561 Pain in right knee: Secondary | ICD-10-CM | POA: Diagnosis not present

## 2015-07-12 DIAGNOSIS — E1129 Type 2 diabetes mellitus with other diabetic kidney complication: Secondary | ICD-10-CM | POA: Diagnosis not present

## 2015-07-12 DIAGNOSIS — E785 Hyperlipidemia, unspecified: Secondary | ICD-10-CM | POA: Diagnosis not present

## 2015-07-12 DIAGNOSIS — I10 Essential (primary) hypertension: Secondary | ICD-10-CM | POA: Diagnosis not present

## 2015-07-12 DIAGNOSIS — E538 Deficiency of other specified B group vitamins: Secondary | ICD-10-CM | POA: Diagnosis not present

## 2015-07-12 DIAGNOSIS — E1149 Type 2 diabetes mellitus with other diabetic neurological complication: Secondary | ICD-10-CM | POA: Diagnosis not present

## 2015-07-13 DIAGNOSIS — M545 Low back pain: Secondary | ICD-10-CM | POA: Diagnosis not present

## 2015-07-18 DIAGNOSIS — M545 Low back pain: Secondary | ICD-10-CM | POA: Diagnosis not present

## 2015-07-19 DIAGNOSIS — E785 Hyperlipidemia, unspecified: Secondary | ICD-10-CM | POA: Diagnosis not present

## 2015-07-19 DIAGNOSIS — E1129 Type 2 diabetes mellitus with other diabetic kidney complication: Secondary | ICD-10-CM | POA: Diagnosis not present

## 2015-07-19 DIAGNOSIS — Z23 Encounter for immunization: Secondary | ICD-10-CM | POA: Diagnosis not present

## 2015-07-19 DIAGNOSIS — E781 Pure hyperglyceridemia: Secondary | ICD-10-CM | POA: Diagnosis not present

## 2015-07-19 DIAGNOSIS — E1149 Type 2 diabetes mellitus with other diabetic neurological complication: Secondary | ICD-10-CM | POA: Diagnosis not present

## 2015-07-19 DIAGNOSIS — Z720 Tobacco use: Secondary | ICD-10-CM | POA: Diagnosis not present

## 2015-07-19 DIAGNOSIS — Z6830 Body mass index (BMI) 30.0-30.9, adult: Secondary | ICD-10-CM | POA: Diagnosis not present

## 2015-07-19 DIAGNOSIS — Z1389 Encounter for screening for other disorder: Secondary | ICD-10-CM | POA: Diagnosis not present

## 2015-07-19 DIAGNOSIS — I10 Essential (primary) hypertension: Secondary | ICD-10-CM | POA: Diagnosis not present

## 2015-07-19 DIAGNOSIS — Z0000 Encounter for general adult medical examination without abnormal findings: Secondary | ICD-10-CM | POA: Diagnosis not present

## 2015-07-20 DIAGNOSIS — M545 Low back pain: Secondary | ICD-10-CM | POA: Diagnosis not present

## 2015-07-20 DIAGNOSIS — Z1212 Encounter for screening for malignant neoplasm of rectum: Secondary | ICD-10-CM | POA: Diagnosis not present

## 2015-07-31 DIAGNOSIS — M545 Low back pain: Secondary | ICD-10-CM | POA: Diagnosis not present

## 2015-08-02 DIAGNOSIS — M545 Low back pain: Secondary | ICD-10-CM | POA: Diagnosis not present

## 2015-08-13 DIAGNOSIS — M545 Low back pain: Secondary | ICD-10-CM | POA: Diagnosis not present

## 2015-08-14 ENCOUNTER — Ambulatory Visit
Admission: RE | Admit: 2015-08-14 | Discharge: 2015-08-14 | Disposition: A | Discharge status: Home / Self Care | Payer: Medicare Other | Source: Ambulatory Visit

## 2015-08-14 DIAGNOSIS — Z1231 Encounter for screening mammogram for malignant neoplasm of breast: Secondary | ICD-10-CM | POA: Diagnosis not present

## 2015-08-20 ENCOUNTER — Other Ambulatory Visit: Payer: Self-pay | Admitting: Internal Medicine

## 2015-08-20 DIAGNOSIS — R928 Other abnormal and inconclusive findings on diagnostic imaging of breast: Secondary | ICD-10-CM

## 2015-08-21 DIAGNOSIS — M545 Low back pain: Secondary | ICD-10-CM | POA: Diagnosis not present

## 2015-08-24 ENCOUNTER — Other Ambulatory Visit: Payer: Medicare Other

## 2015-08-24 ENCOUNTER — Other Ambulatory Visit: Payer: Self-pay | Admitting: Internal Medicine

## 2015-08-24 ENCOUNTER — Ambulatory Visit
Admission: RE | Admit: 2015-08-24 | Discharge: 2015-08-24 | Disposition: A | Discharge status: Home / Self Care | Payer: Medicare Other | Source: Ambulatory Visit | Attending: Internal Medicine | Admitting: Internal Medicine

## 2015-08-24 DIAGNOSIS — R928 Other abnormal and inconclusive findings on diagnostic imaging of breast: Secondary | ICD-10-CM

## 2015-09-04 DIAGNOSIS — M545 Low back pain: Secondary | ICD-10-CM | POA: Diagnosis not present

## 2015-10-01 ENCOUNTER — Ambulatory Visit: Payer: Medicare Other | Admitting: Neurology

## 2015-10-04 ENCOUNTER — Other Ambulatory Visit (INDEPENDENT_AMBULATORY_CARE_PROVIDER_SITE_OTHER): Payer: Medicare Other

## 2015-10-04 ENCOUNTER — Encounter: Payer: Self-pay | Admitting: Neurology

## 2015-10-04 ENCOUNTER — Ambulatory Visit (INDEPENDENT_AMBULATORY_CARE_PROVIDER_SITE_OTHER): Payer: Medicare Other | Admitting: Neurology

## 2015-10-04 VITALS — BP 130/80 | HR 69 | Ht 67.0 in | Wt 182.2 lb

## 2015-10-04 DIAGNOSIS — E0842 Diabetes mellitus due to underlying condition with diabetic polyneuropathy: Secondary | ICD-10-CM

## 2015-10-04 DIAGNOSIS — E538 Deficiency of other specified B group vitamins: Secondary | ICD-10-CM | POA: Diagnosis not present

## 2015-10-04 DIAGNOSIS — M792 Neuralgia and neuritis, unspecified: Secondary | ICD-10-CM

## 2015-10-04 DIAGNOSIS — F17200 Nicotine dependence, unspecified, uncomplicated: Secondary | ICD-10-CM | POA: Diagnosis not present

## 2015-10-04 LAB — VITAMIN B12: Vitamin B-12: 532 pg/mL (ref 211–911)

## 2015-10-04 NOTE — Progress Notes (Signed)
Note routed

## 2015-10-04 NOTE — Progress Notes (Signed)
Follow-up Visit   Date: 10/04/2015    Melanie [name] MRN: [id number] DOB: 18-Mar-1946   Interim History: Melanie [name] is a 70 y.o. right-handed Caucasian female with hypertension, bradycardia, diabetes mellitus, paroxysmal atrial fibrillation, GERD, and L2-L3 disc protrusion s/p decompression (07/2013) returning to the clinic for follow-up of diabetic neuropathy.   History of present illness: Starting in the Spring of 2014, she developed sharp radiating pain down her low back and legs and was found to have L2-3 disc protrusion. She underwent L2-L3 decompression in November 2014 which resolved her radiating back pain, but she has noticed numbness and tingling/stabbing pain over bilateral feet. When she severe, she feels as if there is "sand paper" on her feet. Symptoms are constant, worse in the evening. She was taking vicodin for her back pain which helped her feet symptoms. Prolonged standing and tight shoes exacerbates pain. Numbness and tingling is localized to her feet. Of note, she complains of numbness over the right lateral surface of the thigh which has been there since she was pregnant with her son.   In late 2014, neurontin was increased to 900mg  TID, and she noticed an improvement in her painful paresthesias where it was nearly resolved until March 2015. She has been getting B12 injections and reports an improvement in her energy level because she is less fatigued.   - Follow-up 03/13/2014:  She is getting about 75% reduction with gabapentin 900mg  TID.  Worsening of symptoms at her last visit is most likely due to life stressors.    - UPDATE 09/04/2014:  She is doing great with her diabetes and says her last HbA1c was ~5%.  She continues to get benefit from gabapentin 900mg  TID, but has intermittent bad days where nothing helps her pain.   - UDPATE 03/05/2015:  She continues to have burning paresthesias of her feet which is worse at night, but daytime pain is  well-controlled on her current regime of gabapentin 900mg  TID.  Since starting nortriptyline 10mg  qhs, she got several months of relief, but then started having worsening pain pain.  Diabetes is doing well with last HbA1c 6.4.  She is still smoking and not interested in quitting.    - UPDATE 10/04/2015:  At her last visit, I increased nortriptyline to 20mg  at bedtime and she feels that it has helped her painful paresthesias a lot.  She started doing physical therapy for her right knee and began experiencing muscle spasms of her low back, but did not notice any benefit. She was given cyclobenzaprine 5mg  by Dr. Lorin Mercy, her orthopeadic surgeon,  which helps some, but rest provides the greatest benefit. She is trying to cut down on smoking, especially as her son who was a longtime smoker recently quit.   Medications:  Current Outpatient Prescriptions on File Prior to Visit  Medication Sig Dispense Refill  . aspirin EC 81 MG tablet Take 81 mg by mouth every morning.     Marland Kitchen aspirin-acetaminophen-caffeine (EXCEDRIN MIGRAINE) 250-250-65 MG per tablet Take 1-2 tablets by mouth every 6 (six) hours as needed for headache.    . bisoprolol-hydrochlorothiazide (ZIAC) 5-6.25 MG per tablet Take 1 tablet by mouth 2 (two) times daily. 180 tablet 3  . esomeprazole (NEXIUM) 20 MG capsule Take 20 mg by mouth every morning.    . Exenatide ER 2 MG PEN Inject 1 each into the skin once a week. On Wed.    Marland Kitchen gabapentin (NEURONTIN) 300 MG capsule Take 3 capsules (900 mg total) by  mouth 3 (three) times daily. Take 3 pills 3 x a day. 270 capsule 5  . HYDROcodone-acetaminophen (NORCO/VICODIN) 5-325 MG per tablet Take 1-2 tablets by mouth every 4 (four) hours as needed for moderate pain. 20 tablet 0  . lisinopril (PRINIVIL,ZESTRIL) 20 MG tablet TAKE ONE TABLET BY MOUTH ONCE DAILY 90 tablet 2  . metFORMIN (GLUCOPHAGE) 1000 MG tablet Take 1,000 mg by mouth 2 (two) times daily.    . nortriptyline (PAMELOR) 10 MG capsule Take 2 capsules  (20 mg total) by mouth at bedtime. 180 capsule 3  . sertraline (ZOLOFT) 50 MG tablet Take 50 mg by mouth at bedtime.      No current facility-administered medications on file prior to visit.    Allergies:  Allergies  Allergen Reactions  . Fenofibrate Other (See Comments)    "muscle spasms"  . Metoprolol Other (See Comments)    REACTION: Headache    Review of Systems:  CONSTITUTIONAL: No fevers, chills, night sweats, +weight loss.   EYES: No visual changes or eye pain ENT: No hearing changes.  No history of nose bleeds.   RESPIRATORY: No cough, wheezing and shortness of breath.   CARDIOVASCULAR: Negative for chest pain, and palpitations.   GI: Negative for abdominal discomfort, blood in stools or black stools.  No recent change in bowel habits.   GU:  No history of incontinence.   MUSCLOSKELETAL: No history of joint pain or swelling.  No myalgias.   SKIN: Negative for lesions, rash, and itching.   ENDOCRINE: Negative for cold or heat intolerance, polydipsia or goiter.   PSYCH:  +depression or anxiety symptoms.   NEURO: As Above.   Vital Signs:  BP 130/80 mmHg  Pulse 69  Ht 5\' 7"  (1.702 m)  Wt 182 lb 4 oz (82.668 kg)  BMI 28.54 kg/m2  SpO2 96%  Neurological Exam: MENTAL STATUS including orientation to time, place, person, recent and remote memory, attention span and concentration, language, and fund of knowledge is normal.    CRANIAL NERVES:  Pupils equal round and reactive to light.  Normal conjugate, extra-ocular eye movements in all directions of gaze.  Face is symmetric. Palate elevates symmetrically.   MOTOR:  Motor strength is 5/5 in all extremities, including distally in the legs.    MSRs:  Reflexes are 2+/4 in the upper extremities, 1+/4 at the patella and Achilles bilaterally.  SENSORY:  Vibration is diminished to 50% at her ankles .  Rhomberg is negative.   COORDINATION/GAIT:  Finger to nose testing intact.  Rapid alternating movements intact.  Gait narrow  based and stable.  Unsteady with tandem gait.   Data: Labs 09/22/2013:  Copper 85, B12 208*, TSH 0.40, SPEP/UPEP with IFE:  No M protein HbA1c 6.4  02/2015  MRI lumbar spine 03/15/2013:  1. Lumbar spondylosis and degenerative disc disease cause moderate impingement at L2-3 and L4-5, and mild impingement at L3-4 as detailed above.  2. 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 I doubt that this is a pheochromocytoma given the intermediate T2 signal characteristics, but otherwise this mass is nonspecific (although statistically most likely to represent an adenoma). If the patient has a history of lung cancer or if further workup is warranted, MRI of the adrenal glands with and without contrast provides the greatest diagnostic specificity.  3. Atrophic left kidney.   IMPRESSION/PLAN: 1.  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  Vitamin B12 deficiency  - Check vitamin B12  - Continue vitamin B12 1049mcg PO daily 3.  Low back muscle spasms  - Continue flexeril 5mg  qhs prn  - increasing nortriptyline may also help  - MRI lumbar spine if no improvement 4.  Lumbar spondylosis with moderate impingement at L2-3 and L4-5 s/p decompression (November 2014) 5.  Diabetes mellitus (HbA1c 6.4), well-controlled 6.  Smoking cessation instruction/counseling given  Return to clinic in 70-months  The duration of this appointment visit was 25 minutes of face-to-face time with the patient.  Greater than 50% of this time was spent in counseling, explanation of diagnosis, planning of further management, and coordination of care.   Thank you for allowing me to participate in patient's care.  If I can answer any additional  questions, I would be pleased to do so.    Sincerely,    Olie Dibert K. Posey Pronto, DO

## 2015-10-04 NOTE — Patient Instructions (Addendum)
1.  Increase nortriptyline 30mg  at bedtime 2.  Continue gabapentin 900mg  three times daily 3.  Check vitamin B12 4.  Keep trying to quit smoking  Return to clinic in 6 months

## 2015-10-12 ENCOUNTER — Ambulatory Visit (INDEPENDENT_AMBULATORY_CARE_PROVIDER_SITE_OTHER): Payer: Medicare Other | Admitting: Cardiovascular Disease

## 2015-10-12 ENCOUNTER — Encounter: Payer: Self-pay | Admitting: Cardiovascular Disease

## 2015-10-12 VITALS — BP 106/70 | HR 92 | Ht 67.0 in | Wt 182.0 lb

## 2015-10-12 DIAGNOSIS — I10 Essential (primary) hypertension: Secondary | ICD-10-CM

## 2015-10-12 DIAGNOSIS — I480 Paroxysmal atrial fibrillation: Secondary | ICD-10-CM

## 2015-10-12 NOTE — Patient Instructions (Signed)
Medication Instructions:  Your physician recommends that you continue on your current medications as directed. Please refer to the Current Medication list given to you today.   Labwork: None Ordered   Testing/Procedures: None Ordered   Follow-Up: Your physician wants you to follow-up in: 1 year with Dr. Nahser.  You will receive a reminder letter in the mail two months in advance. If you don't receive a letter, please call our office to schedule the follow-up appointment.   If you need a refill on your cardiac medications before your next appointment, please call your pharmacy.   Thank you for choosing [facility]! Clem Wisenbaker, RN [phone]    

## 2015-10-12 NOTE — Progress Notes (Signed)
Cardiology Office Note   Date:  10/12/2015   ID:  Teneille, Kolakowski [birth year], MRN [id number]  PCP:  Marton Redwood, MD  Cardiologist:   Acie Fredrickson Wonda Cheng, MD   Chief Complaint  Patient presents with  . Follow-up      History of Present Illness: Melanie [name] is a 70 y.o. female who presents for   Problem List 1. HTN 2. Bradycardia 3. Hyperlipidemia 4. Depression 5. History of chest pain-cardiac catheterization in 2002 revealed normal coronary arteries and normal left ventricle systolic function 6. Former smoker-quit in November, 2012 7. Paroxysmal atrial fibrillation A. Type 2 diabetes mellitus  History of Present Illness: Melanie [name] is seen today for followup visit. She has history of hypertensive heart and bradycardia which has been improved with reduction of Cardizem. In general, she's been doing well and has adequate blood pressure control.  She feels fairly well but unfortunately she's gained a little bit of weight. She still is under tremendous stress taking care of her invalid husband at home. Unable to get a lot of regular exercise.  October 04, 2012: She is doing well. Unfortunately she started smoking again. She's not having episodes of chest pain or shortness breath. She has a history of paroxysmal atrial fibrillation in the past and has been maintained on metoprolol and diltiazem for long time. She informed that the cost of her diltiazem will be increasing since she'll need to find a replacement   December 09, 2013:  Melanie [name] is doing OK. She has been seen by Cecille Rubin and [place] since I saw her. She had back surgery last year.  She's been out of her bisoprolol for the past 3 days which probably explains her fast heart rate.  Jan. 26, 2016:   Melanie [name] is seen today .   Has not had any palpitations.  Seems to be doing well.   BP has been ok Has gained some weight.  Still smoking   Jan. 27, 2017: Melanie [name] is seen back for a 1 year visit  Had her gall  bladder out in April.  Has some DOE - still smoking .  Has tried Zyban - but it did not work .   Has had various orthopedic issues.    Past Medical History  Diagnosis Date  . HTN (hypertension)   . Bradycardia   . Situational stress     "was taking care of sick husband"  . PAF (paroxysmal atrial fibrillation) (HCC) 10 years ago    no problems since, due to stress  . GERD (gastroesophageal reflux disease)   . Arthritis   . DM (diabetes mellitus) ([place])     type 2  . Depression     under control  . Headache   . Family history of anesthesia complication     mother-nausea and vomiting  . Neuropathy (HCC)     legs and feet    Past Surgical History  Procedure Laterality Date  . Knee arthroscopy Bilateral 1990's  . Carpal tunnel release Bilateral 2003, 2004  . Back surgery  2004  . Colonoscopy  8 years ago  . Lumbar laminectomy N/A 07/20/2013    Procedure: MICRODISCECTOMY LUMBAR LAMINECTOMY;  Surgeon: [name], MD;  Location: [facility];  Service: Orthopedics;  Laterality: N/A;  L2-3 Decompression  . Joint replacement Bilateral 2007  . Appendectomy  1980  . Abdominal hysterectomy  1980    partial  . Cholecystectomy N/A 12/28/2014    Procedure: LAPAROSCOPIC CHOLECYSTECTOMY WITH INTRAOPERATIVE CHOLANGIOGRAM;  Surgeon: Armandina Gemma, MD;  Location: [facility];  Service: General;  Laterality: N/A;     Current Outpatient Prescriptions  Medication Sig Dispense Refill  . aspirin EC 81 MG tablet Take 81 mg by mouth every morning.     Marland Kitchen aspirin-acetaminophen-caffeine (EXCEDRIN MIGRAINE) 250-250-65 MG per tablet Take 1-2 tablets by mouth every 6 (six) hours as needed for headache.    . bisoprolol-hydrochlorothiazide (ZIAC) 5-6.25 MG per tablet Take 1 tablet by mouth 2 (two) times daily. 180 tablet 3  . esomeprazole (NEXIUM) 20 MG capsule Take 20 mg by mouth every morning.    . Exenatide ER 2 MG PEN Inject 1 each into the skin once a week. On Wed.    Marland Kitchen gabapentin (NEURONTIN) 300 MG capsule Take  3 capsules (900 mg total) by mouth 3 (three) times daily. Take 3 pills 3 x a day. 270 capsule 5  . HYDROcodone-acetaminophen (NORCO/VICODIN) 5-325 MG per tablet Take 1-2 tablets by mouth every 4 (four) hours as needed for moderate pain. 20 tablet 0  . lisinopril (PRINIVIL,ZESTRIL) 20 MG tablet TAKE ONE TABLET BY MOUTH ONCE DAILY 90 tablet 2  . metFORMIN (GLUCOPHAGE) 1000 MG tablet Take 1,000 mg by mouth 2 (two) times daily.    . nortriptyline (PAMELOR) 10 MG capsule Take 2 capsules (20 mg total) by mouth at bedtime. 180 capsule 3  . sertraline (ZOLOFT) 50 MG tablet Take 50 mg by mouth at bedtime.      No current facility-administered medications for this visit.    Allergies:   Fenofibrate and Metoprolol    Social History:  The patient  reports that she has been smoking Cigarettes.  She has a 15 pack-year smoking history. She has never used smokeless tobacco. She reports that she does not drink alcohol or use illicit drugs.   Family History:  The patient's family history includes Heart attack in her father; Hypertension in her mother and sister; Multiple sclerosis in her sister; Neuropathy in her mother and sister; Other in her mother; Ovarian cancer in her sister; Stroke in her sister.    ROS:  Please see the history of present illness.   Otherwise, review of systems are positive for none.   All other systems are reviewed and negative.    PHYSICAL EXAM: VS:  BP 106/70 mmHg  Pulse 92  Ht 5\' 7"  (1.702 m)  Wt 182 lb (82.555 kg)  BMI 28.50 kg/m2 , BMI Body mass index is 28.5 kg/(m^2). GEN: Well nourished, well developed, in no acute distress HEENT: normal Neck: no JVD, carotid bruits, or masses Cardiac: RRR; no murmurs, rubs, or gallops,no edema  Respiratory:  clear to auscultation bilaterally, normal work of breathing GI: soft, nontender, nondistended, + BS MS: no deformity or atrophy Skin: warm and dry, no rash Neuro:  Strength and sensation are intact Psych: normal  EKG:  EKG is  ordered today. The ekg ordered today demonstrates NSR at 92.   Normal ECG   Recent Labs: 12/26/2014: Hemoglobin 12.3; Platelets 241    Lipid Panel No results found for: CHOL, TRIG, HDL, CHOLHDL, VLDL, LDLCALC, LDLDIRECT    Wt Readings from Last 3 Encounters:  10/12/15 182 lb (82.555 kg)  10/04/15 182 lb 4 oz (82.668 kg)  03/05/15 191 lb 3 oz (86.722 kg)      Other studies Reviewed: Additional studies/ records that were reviewed today include: . Review of the above records demonstrates:    ASSESSMENT AND PLAN:  1. HTN - BP is well controlled. Continue  same meds.  2. Bradycardia - resolved,  HR is normal   3. Hyperlipidemia- labs are monitored by Dr. Brigitte Pulse.  4. History of chest pain-cardiac catheterization in 2002 revealed normal coronary arteries and normal left ventricle systolic function  5. Smoker - I've recommended that she continue to try to stop smoking .   6. Paroxysmal atrial fibrillation-    Still in NSR .    Doing great.   7. Type 2 diabetes mellitus - followed by Dr. Brigitte Pulse.   Current medicines are reviewed at length with the patient today.  The patient does not have concerns regarding medicines.  The following changes have been made:  no change  Labs/ tests ordered today include:  No orders of the defined types were placed in this encounter.     Disposition:   FU with me in 1 year.     Signed, Nahser, Wonda Cheng, MD  10/12/2015 11:53 AM    [address], [place], [place]  [address] Phone: [phone]; Fax: [phone]

## 2015-10-22 ENCOUNTER — Other Ambulatory Visit: Payer: Self-pay | Admitting: *Deleted

## 2015-10-22 MED ORDER — NORTRIPTYLINE HCL 10 MG PO CAPS: 10.0000 mg | ORAL_CAPSULE | Freq: Every day | ORAL | Status: DC

## 2015-11-28 DIAGNOSIS — Z6829 Body mass index (BMI) 29.0-29.9, adult: Secondary | ICD-10-CM | POA: Diagnosis not present

## 2015-11-28 DIAGNOSIS — E1149 Type 2 diabetes mellitus with other diabetic neurological complication: Secondary | ICD-10-CM | POA: Diagnosis not present

## 2015-11-28 DIAGNOSIS — I10 Essential (primary) hypertension: Secondary | ICD-10-CM | POA: Diagnosis not present

## 2015-11-28 DIAGNOSIS — G629 Polyneuropathy, unspecified: Secondary | ICD-10-CM | POA: Diagnosis not present

## 2015-11-28 DIAGNOSIS — E1129 Type 2 diabetes mellitus with other diabetic kidney complication: Secondary | ICD-10-CM | POA: Diagnosis not present

## 2015-11-28 DIAGNOSIS — E781 Pure hyperglyceridemia: Secondary | ICD-10-CM | POA: Diagnosis not present

## 2015-11-28 DIAGNOSIS — M5416 Radiculopathy, lumbar region: Secondary | ICD-10-CM | POA: Diagnosis not present

## 2015-11-28 DIAGNOSIS — E784 Other hyperlipidemia: Secondary | ICD-10-CM | POA: Diagnosis not present

## 2015-11-28 DIAGNOSIS — F329 Major depressive disorder, single episode, unspecified: Secondary | ICD-10-CM | POA: Diagnosis not present

## 2015-12-03 ENCOUNTER — Other Ambulatory Visit: Payer: Self-pay | Admitting: Cardiovascular Disease

## 2016-01-07 ENCOUNTER — Telehealth: Payer: Self-pay | Admitting: Neurology

## 2016-01-07 MED ORDER — GABAPENTIN 300 MG PO CAPS: 900.0000 mg | ORAL_CAPSULE | Freq: Three times a day (TID) | ORAL | Status: DC

## 2016-01-07 NOTE — Telephone Encounter (Signed)
Rx sent to patient's pharmacy

## 2016-01-07 NOTE — Telephone Encounter (Signed)
PT called and said she is out of her Gabapentin and wanted to see if a refill could be called in/Dawn CB# [phone]

## 2016-01-08 ENCOUNTER — Other Ambulatory Visit: Payer: Self-pay | Admitting: *Deleted

## 2016-01-08 ENCOUNTER — Telehealth: Payer: Self-pay | Admitting: Neurology

## 2016-01-08 MED ORDER — GABAPENTIN 300 MG PO CAPS
900.0000 mg | ORAL_CAPSULE | Freq: Three times a day (TID) | ORAL | Status: DC
Start: 2016-01-08 — End: 2016-04-03

## 2016-01-08 NOTE — Telephone Encounter (Signed)
Melanie [name] [birth year]. She called yesterday 01/07/16 to let us know she is out of her Gabbapentin. She didn't receive a call back from one of the other nurses. She said she has been out of her medication since Sunday.  She said she is starting to feel the effects of not taking it. She would like a refill called in at [facility] in [place]. Her call back is [phone]. Thank you

## 2016-01-08 NOTE — Telephone Encounter (Signed)
Patient's Rx was sent in yesterday.  Melanie [name] calling patient to let her know.

## 2016-02-09 ENCOUNTER — Other Ambulatory Visit: Payer: Self-pay | Admitting: Neurology

## 2016-02-12 ENCOUNTER — Other Ambulatory Visit: Payer: Self-pay | Admitting: *Deleted

## 2016-02-12 MED ORDER — NORTRIPTYLINE HCL 10 MG PO CAPS: 10.0000 mg | ORAL_CAPSULE | Freq: Every day | ORAL | Status: DC

## 2016-02-12 NOTE — Telephone Encounter (Signed)
Rx sent 

## 2016-03-04 DIAGNOSIS — M545 Low back pain: Secondary | ICD-10-CM | POA: Diagnosis not present

## 2016-03-20 DIAGNOSIS — M542 Cervicalgia: Secondary | ICD-10-CM | POA: Diagnosis not present

## 2016-03-20 DIAGNOSIS — Z6830 Body mass index (BMI) 30.0-30.9, adult: Secondary | ICD-10-CM | POA: Diagnosis not present

## 2016-03-20 DIAGNOSIS — F329 Major depressive disorder, single episode, unspecified: Secondary | ICD-10-CM | POA: Diagnosis not present

## 2016-03-20 DIAGNOSIS — E1129 Type 2 diabetes mellitus with other diabetic kidney complication: Secondary | ICD-10-CM | POA: Diagnosis not present

## 2016-03-20 DIAGNOSIS — I10 Essential (primary) hypertension: Secondary | ICD-10-CM | POA: Diagnosis not present

## 2016-03-20 DIAGNOSIS — E784 Other hyperlipidemia: Secondary | ICD-10-CM | POA: Diagnosis not present

## 2016-03-20 DIAGNOSIS — E1149 Type 2 diabetes mellitus with other diabetic neurological complication: Secondary | ICD-10-CM | POA: Diagnosis not present

## 2016-03-20 DIAGNOSIS — E781 Pure hyperglyceridemia: Secondary | ICD-10-CM | POA: Diagnosis not present

## 2016-03-24 ENCOUNTER — Other Ambulatory Visit: Payer: Self-pay | Admitting: Cardiovascular Disease

## 2016-04-03 ENCOUNTER — Encounter: Payer: Self-pay | Admitting: Neurology

## 2016-04-03 ENCOUNTER — Ambulatory Visit (INDEPENDENT_AMBULATORY_CARE_PROVIDER_SITE_OTHER): Payer: Medicare Other | Admitting: Neurology

## 2016-04-03 VITALS — BP 150/80 | HR 77 | Ht 67.0 in | Wt 190.2 lb

## 2016-04-03 DIAGNOSIS — E0842 Diabetes mellitus due to underlying condition with diabetic polyneuropathy: Secondary | ICD-10-CM

## 2016-04-03 MED ORDER — GABAPENTIN 300 MG PO CAPS: 900.0000 mg | ORAL_CAPSULE | Freq: Three times a day (TID) | ORAL | Status: DC

## 2016-04-03 MED ORDER — NORTRIPTYLINE HCL 10 MG PO CAPS: 30.0000 mg | ORAL_CAPSULE | Freq: Every day | ORAL | Status: DC

## 2016-04-03 NOTE — Progress Notes (Signed)
Follow-up Visit   Date: 04/03/2016    Melanie [name] MRN: [id number] DOB: 07-16-1946   Interim History: Melanie [name] is a 70 y.o. right-handed Caucasian female with hypertension, bradycardia, diabetes mellitus, paroxysmal atrial fibrillation, GERD, and L2-L3 disc protrusion s/p decompression (07/2013) returning to the clinic for follow-up of diabetic neuropathy.   History of present illness: Starting in the Spring of 2014, she developed sharp radiating pain down her low back and legs and was found to have L2-3 disc protrusion. She underwent L2-L3 decompression in November 2014 which resolved her radiating back pain, but she has noticed numbness and tingling/stabbing pain over bilateral feet. When she severe, she feels as if there is "sand paper" on her feet. Symptoms are constant, worse in the evening. She was taking vicodin for her back pain which helped her feet symptoms. Prolonged standing and tight shoes exacerbates pain. Numbness and tingling is localized to her feet. Of note, she complains of numbness over the right lateral surface of the thigh which has been there since she was pregnant with her son.   In late 2014, neurontin was increased to 900mg  TID, and she noticed an improvement in her painful paresthesias where it was nearly resolved until March 2015. She has been getting B12 injections and reports an improvement in her energy level because she is less fatigued.  In late 2015, nortriptyline was added due to worsening paresthesia.    - UPDATE 04/03/2016:  She reports having more good days than bad days with respect to her neuropathy, but overall is doing well.  She has noticed that she sometimes her legs bother her more in the evenings and she has to move her legs, but leg movement does not provide relief.  She is doing well on gabapentin 900mg  TID and nortriptyline 30mg  at bedtime.  No new complaints.   Medications:  Current Outpatient Prescriptions on File Prior to  Visit  Medication Sig Dispense Refill  . aspirin EC 81 MG tablet Take 81 mg by mouth every morning.     Marland Kitchen aspirin-acetaminophen-caffeine (EXCEDRIN MIGRAINE) 250-250-65 MG per tablet Take 1-2 tablets by mouth every 6 (six) hours as needed for headache.    . bisoprolol-hydrochlorothiazide (ZIAC) 5-6.25 MG tablet TAKE ONE TABLET BY MOUTH TWICE DAILY 180 tablet 3  . esomeprazole (NEXIUM) 20 MG capsule Take 20 mg by mouth every morning.    . Exenatide ER 2 MG PEN Inject 1 each into the skin once a week. On Wed.    Marland Kitchen HYDROcodone-acetaminophen (NORCO/VICODIN) 5-325 MG per tablet Take 1-2 tablets by mouth every 4 (four) hours as needed for moderate pain. 20 tablet 0  . lisinopril (PRINIVIL,ZESTRIL) 20 MG tablet TAKE ONE TABLET BY MOUTH ONCE DAILY 90 tablet 0  . metFORMIN (GLUCOPHAGE) 1000 MG tablet Take 1,000 mg by mouth 2 (two) times daily.    . sertraline (ZOLOFT) 50 MG tablet Take 50 mg by mouth at bedtime.      No current facility-administered medications on file prior to visit.    Allergies:  Allergies  Allergen Reactions  . Fenofibrate Other (See Comments)    "muscle spasms"  . Metoprolol Other (See Comments)    REACTION: Headache    Review of Systems:  CONSTITUTIONAL: No fevers, chills, night sweats, +weight loss.   EYES: No visual changes or eye pain ENT: No hearing changes.  No history of nose bleeds.   RESPIRATORY: No cough, wheezing and shortness of breath.   CARDIOVASCULAR: Negative for chest pain, and  palpitations.   GI: Negative for abdominal discomfort, blood in stools or black stools.  No recent change in bowel habits.   GU:  No history of incontinence.   MUSCLOSKELETAL: No history of joint pain or swelling.  No myalgias.   SKIN: Negative for lesions, rash, and itching.   ENDOCRINE: Negative for cold or heat intolerance, polydipsia or goiter.   PSYCH:  +depression or anxiety symptoms.   NEURO: As Above.   Vital Signs:  BP 150/80 mmHg  Pulse 77  Ht 5\' 7"  (1.702 m)   Wt 190 lb 3 oz (86.268 kg)  BMI 29.78 kg/m2  SpO2 97%  Neurological Exam: MENTAL STATUS including orientation to time, place, person, recent and remote memory, attention span and concentration, language, and fund of knowledge is normal.    CRANIAL NERVES:  Pupils equal round and reactive to light.  Normal conjugate, extra-ocular eye movements in all directions of gaze.  Face is symmetric. Palate elevates symmetrically.   MOTOR:  Motor strength is 5/5 in all extremities, including distally in the feet.    MSRs:  Reflexes are 2+/4 in the upper extremities, 1+/4 at the patella and Achilles bilaterally.  SENSORY:  Vibration is diminished to 50% at her ankles   COORDINATION/GAIT:  Gait narrow based and stable.    Data: MRI lumbar spine 03/15/2013:  1. Lumbar spondylosis and degenerative disc disease cause moderate impingement at L2-3 and L4-5, and mild impingement at L3-4 as detailed above.  2. 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 I doubt that this is a pheochromocytoma given the intermediate T2 signal characteristics, but otherwise this mass is nonspecific (although statistically most likely to represent an adenoma). If the patient has a history of lung cancer or if further workup is warranted, MRI of the adrenal glands with and without contrast provides the greatest diagnostic specificity.  3. Atrophic left kidney.  Lab Results  Component Value Date   [id number] 10/04/2015    IMPRESSION/PLAN: 1.  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  Lumbar spondylosis with moderate impingement at L2-3 and L4-5 s/p decompression (November 2014), followed by Dr. Lorin Mercy  - Continue flexeril 5mg  qhs prn  3..  Diabetes mellitus (HbA1c 7.0),  followed by PCP  4.  Smoking cessation instruction/counseling given at length  Return to clinic in 70-months  The duration of this appointment visit was 30 minutes of face-to-face time with the patient.  Greater than 50% of this time was spent in counseling, explanation of diagnosis, planning of further management, and coordination of care.   Thank you for allowing me to participate in patient's care.  If I can answer any additional questions, I would be pleased to do so.    Sincerely,    Donika K. Posey Pronto, DO

## 2016-04-03 NOTE — Patient Instructions (Signed)
Continue your medications as you are taking them  Return to clinic in 6 months 

## 2016-05-07 DIAGNOSIS — E119 Type 2 diabetes mellitus without complications: Secondary | ICD-10-CM | POA: Diagnosis not present

## 2016-05-07 DIAGNOSIS — Z0100 Encounter for examination of eyes and vision without abnormal findings: Secondary | ICD-10-CM | POA: Diagnosis not present

## 2016-05-07 DIAGNOSIS — H2513 Age-related nuclear cataract, bilateral: Secondary | ICD-10-CM | POA: Diagnosis not present

## 2016-05-07 DIAGNOSIS — H25013 Cortical age-related cataract, bilateral: Secondary | ICD-10-CM | POA: Diagnosis not present

## 2016-06-07 ENCOUNTER — Other Ambulatory Visit: Payer: Self-pay | Admitting: Cardiovascular Disease

## 2016-07-06 DIAGNOSIS — J209 Acute bronchitis, unspecified: Secondary | ICD-10-CM | POA: Diagnosis not present

## 2016-07-06 DIAGNOSIS — J0100 Acute maxillary sinusitis, unspecified: Secondary | ICD-10-CM | POA: Diagnosis not present

## 2016-07-14 DIAGNOSIS — R8299 Other abnormal findings in urine: Secondary | ICD-10-CM | POA: Diagnosis not present

## 2016-07-14 DIAGNOSIS — E784 Other hyperlipidemia: Secondary | ICD-10-CM | POA: Diagnosis not present

## 2016-07-14 DIAGNOSIS — E538 Deficiency of other specified B group vitamins: Secondary | ICD-10-CM | POA: Diagnosis not present

## 2016-07-14 DIAGNOSIS — E1129 Type 2 diabetes mellitus with other diabetic kidney complication: Secondary | ICD-10-CM | POA: Diagnosis not present

## 2016-07-14 DIAGNOSIS — N390 Urinary tract infection, site not specified: Secondary | ICD-10-CM | POA: Diagnosis not present

## 2016-07-14 DIAGNOSIS — I10 Essential (primary) hypertension: Secondary | ICD-10-CM | POA: Diagnosis not present

## 2016-07-18 ENCOUNTER — Other Ambulatory Visit: Payer: Self-pay | Admitting: Internal Medicine

## 2016-07-18 DIAGNOSIS — Z1231 Encounter for screening mammogram for malignant neoplasm of breast: Secondary | ICD-10-CM

## 2016-07-21 DIAGNOSIS — G629 Polyneuropathy, unspecified: Secondary | ICD-10-CM | POA: Diagnosis not present

## 2016-07-21 DIAGNOSIS — Z1389 Encounter for screening for other disorder: Secondary | ICD-10-CM | POA: Diagnosis not present

## 2016-07-21 DIAGNOSIS — E538 Deficiency of other specified B group vitamins: Secondary | ICD-10-CM | POA: Diagnosis not present

## 2016-07-21 DIAGNOSIS — I6529 Occlusion and stenosis of unspecified carotid artery: Secondary | ICD-10-CM | POA: Diagnosis not present

## 2016-07-21 DIAGNOSIS — Z6830 Body mass index (BMI) 30.0-30.9, adult: Secondary | ICD-10-CM | POA: Diagnosis not present

## 2016-07-21 DIAGNOSIS — E1129 Type 2 diabetes mellitus with other diabetic kidney complication: Secondary | ICD-10-CM | POA: Diagnosis not present

## 2016-07-21 DIAGNOSIS — I10 Essential (primary) hypertension: Secondary | ICD-10-CM | POA: Diagnosis not present

## 2016-07-21 DIAGNOSIS — J209 Acute bronchitis, unspecified: Secondary | ICD-10-CM | POA: Diagnosis not present

## 2016-07-21 DIAGNOSIS — E784 Other hyperlipidemia: Secondary | ICD-10-CM | POA: Diagnosis not present

## 2016-07-21 DIAGNOSIS — F329 Major depressive disorder, single episode, unspecified: Secondary | ICD-10-CM | POA: Diagnosis not present

## 2016-07-21 DIAGNOSIS — E1149 Type 2 diabetes mellitus with other diabetic neurological complication: Secondary | ICD-10-CM | POA: Diagnosis not present

## 2016-07-21 DIAGNOSIS — Z23 Encounter for immunization: Secondary | ICD-10-CM | POA: Diagnosis not present

## 2016-08-21 ENCOUNTER — Ambulatory Visit
Admission: RE | Admit: 2016-08-21 | Discharge: 2016-08-21 | Disposition: A | Discharge status: Home / Self Care | Payer: Medicare Other | Source: Ambulatory Visit | Attending: Internal Medicine | Admitting: Internal Medicine

## 2016-08-21 DIAGNOSIS — Z1231 Encounter for screening mammogram for malignant neoplasm of breast: Secondary | ICD-10-CM

## 2016-09-19 ENCOUNTER — Other Ambulatory Visit: Payer: Self-pay | Admitting: Cardiovascular Disease

## 2016-10-06 ENCOUNTER — Encounter: Payer: Self-pay | Admitting: Neurology

## 2016-10-06 ENCOUNTER — Ambulatory Visit (INDEPENDENT_AMBULATORY_CARE_PROVIDER_SITE_OTHER): Payer: Medicare Other | Admitting: Neurology

## 2016-10-06 VITALS — BP 112/68 | HR 86 | Ht 67.0 in | Wt 195.5 lb

## 2016-10-06 DIAGNOSIS — E0842 Diabetes mellitus due to underlying condition with diabetic polyneuropathy: Secondary | ICD-10-CM

## 2016-10-06 DIAGNOSIS — M792 Neuralgia and neuritis, unspecified: Secondary | ICD-10-CM | POA: Diagnosis not present

## 2016-10-06 MED ORDER — NORTRIPTYLINE HCL 10 MG PO CAPS: 40.0000 mg | ORAL_CAPSULE | Freq: Every day | ORAL | 5 refills | Status: DC

## 2016-10-06 NOTE — Patient Instructions (Signed)
1.  Increase nortriptyline to 40mg  at bedtime 2.  Continue gabapentin 900mg  three times daily 3.  Recommend using a cane 4.  If you would like to start physical therapy for your balance  Return to clinic in 6 months

## 2016-10-06 NOTE — Progress Notes (Signed)
Follow-up Visit   Date: 10/06/16    Melanie [name] MRN: [id number] DOB: 1945/11/02   Interim History: Melanie [name] is a 71 y.o. right-handed Caucasian female with hypertension, bradycardia, diabetes mellitus, paroxysmal atrial fibrillation, GERD, and L2-L3 disc protrusion s/p decompression (07/2013) returning to the clinic for follow-up of diabetic neuropathy.   History of present illness: Starting in the Spring of 2014, she developed sharp radiating pain down her low back and legs and was found to have L2-3 disc protrusion. She underwent L2-L3 decompression in November 2014 which resolved her radiating back pain, but she has noticed numbness and tingling/stabbing pain over bilateral feet. When she severe, she feels as if there is "sand paper" on her feet. Symptoms are constant, worse in the evening. She was taking vicodin for her back pain which helped her feet symptoms. Prolonged standing and tight shoes exacerbates pain. Numbness and tingling is localized to her feet. Of note, she complains of numbness over the right lateral surface of the thigh which has been there since she was pregnant with her son.   In late 2014, neurontin was increased to 900mg  TID, and she noticed an improvement in her painful paresthesias where it was nearly resolved until March 2015. She has been getting B12 injections and reports an improvement in her energy level because she is less fatigued.  In late 2015, nortriptyline was added due to worsening paresthesia.  She did well on a combination of neurontin gabapentin 900mg  TID and nortriptyline 30mg  at bedtime.   UPDATE 10/06/2016:  She feels that her neuropathy is getting worse because her numbness is more profound, worse over the soles and toes but extends to her ankles.  She occasionally has sharp pain around her toes and ankles, but she is not bothered by this because it is not severe.  She is most bothered by numbness.  She feels that her feet feel cold  all the time.  She suffered one fall after missing a step, but did not suffer any significant injuries.   Medications:  Current Outpatient Prescriptions on File Prior to Visit  Medication Sig Dispense Refill  . aspirin EC 81 MG tablet Take 81 mg by mouth every morning.     Marland Kitchen aspirin-acetaminophen-caffeine (EXCEDRIN MIGRAINE) 250-250-65 MG per tablet Take 1-2 tablets by mouth every 6 (six) hours as needed for headache.    . bisoprolol-hydrochlorothiazide (ZIAC) 5-6.25 MG tablet TAKE ONE TABLET BY MOUTH TWICE DAILY 180 tablet 3  . esomeprazole (NEXIUM) 20 MG capsule Take 20 mg by mouth every morning.    . Exenatide ER 2 MG PEN Inject 1 each into the skin once a week. On Wed.    Marland Kitchen gabapentin (NEURONTIN) 300 MG capsule Take 3 capsules (900 mg total) by mouth 3 (three) times daily. 270 capsule 11  . lisinopril (PRINIVIL,ZESTRIL) 20 MG tablet TAKE ONE TABLET BY MOUTH ONCE DAILY 30 tablet 0  . metFORMIN (GLUCOPHAGE) 1000 MG tablet Take 1,000 mg by mouth 2 (two) times daily.    . naproxen (NAPROSYN) 500 MG tablet Take 500 mg by mouth 2 (two) times daily with a meal.    . niacin 500 MG tablet Take 500 mg by mouth at bedtime.    . sertraline (ZOLOFT) 50 MG tablet Take 50 mg by mouth at bedtime.     . cyclobenzaprine (FLEXERIL) 10 MG tablet Take 10 mg by mouth 3 (three) times daily as needed for muscle spasms.    Marland Kitchen HYDROcodone-acetaminophen (NORCO/VICODIN) 5-325 MG per  tablet Take 1-2 tablets by mouth every 4 (four) hours as needed for moderate pain. (Patient not taking: Reported on 10/06/2016) 20 tablet 0   No current facility-administered medications on file prior to visit.     Allergies:  Allergies  Allergen Reactions  . Fenofibrate Other (See Comments)    "muscle spasms"  . Metoprolol Other (See Comments)    REACTION: Headache    Review of Systems:  CONSTITUTIONAL: No fevers, chills, night sweats, +weight loss.   EYES: No visual changes or eye pain ENT: No hearing changes.  No history of  nose bleeds.   RESPIRATORY: No cough, wheezing and shortness of breath.   CARDIOVASCULAR: Negative for chest pain, and palpitations.   GI: Negative for abdominal discomfort, blood in stools or black stools.  No recent change in bowel habits.   GU:  No history of incontinence.   MUSCLOSKELETAL: No history of joint pain or swelling.  No myalgias.   SKIN: Negative for lesions, rash, and itching.   ENDOCRINE: Negative for cold or heat intolerance, polydipsia or goiter.   PSYCH:  +depression or anxiety symptoms.   NEURO: As Above.   Vital Signs:  BP 112/68   Pulse 86   Ht 5\' 7"  (1.702 m)   Wt 195 lb 8 oz (88.7 kg)   SpO2 97%   BMI 30.62 kg/m   Neurological Exam: MENTAL STATUS including orientation to time, place, person, recent and remote memory, attention span and concentration, language, and fund of knowledge is normal.    CRANIAL NERVES:  Face is symmetric   MOTOR:  Motor strength is 5/5 in all extremities, including distally in the feet.    MSRs:  Reflexes are 2+/4 in the upper extremities, 1+/4 at the patella and Achilles bilaterally.  SENSORY:  Vibration is diminished to 50% at her ankles, trace at the great toe bilaterally  COORDINATION/GAIT:  Gait narrow based, slow and stable.  Unassisted.     Data: MRI lumbar spine 03/15/2013:  1. Lumbar spondylosis and degenerative disc disease cause moderate impingement at L2-3 and L4-5, and mild impingement at L3-4 as detailed above.  2. 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 I doubt that this is a pheochromocytoma given the intermediate T2 signal characteristics, but otherwise this mass is nonspecific (although statistically most likely to represent an adenoma). If the patient has a history of lung cancer or if further workup is warranted, MRI of the adrenal glands with and without contrast provides the greatest diagnostic specificity.  3. Atrophic left kidney.  Lab Results  Component Value Date   [id number]  10/04/2015    IMPRESSION/PLAN: 1.  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  She complains of worsening numbness. Unfortunately, there is no effective treatment for numbness.  Medications are helpful with positive neuropathic symptoms, which seem to be controlled on her current regimen.  Patient would like to try increasing her nortriptyline to see if this help.  She will increase nortriptyline 40mg  at bedtime and continue neurontin 900mg  TID.  Encouraged her to be better with her diabetes, last HbA1c 8.0  2.  Lumbar spondylosis with moderate impingement at L2-3 and L4-5 s/p decompression (November 2014), followed by Dr. Lorin Mercy  3..  Diabetes mellitus (HbA1c 8.0), followed by PCP  Return to clinic in 46-months  The duration of this appointment visit was 30 minutes of face-to-face time with the patient.  Greater than 50% of this time was spent in counseling, explanation of diagnosis,  planning of further management, and coordination of care.   Thank you for allowing me to participate in patient's care.  If I can answer any additional questions, I would be pleased to do so.    Sincerely,    Brannon Decaire K. Posey Pronto, DO

## 2016-10-13 ENCOUNTER — Ambulatory Visit (INDEPENDENT_AMBULATORY_CARE_PROVIDER_SITE_OTHER): Payer: Medicare Other | Admitting: Cardiovascular Disease

## 2016-10-13 ENCOUNTER — Encounter: Payer: Self-pay | Admitting: Cardiovascular Disease

## 2016-10-13 VITALS — BP 130/76 | HR 72 | Ht 67.0 in | Wt 198.0 lb

## 2016-10-13 DIAGNOSIS — R0789 Other chest pain: Secondary | ICD-10-CM | POA: Diagnosis not present

## 2016-10-13 DIAGNOSIS — I10 Essential (primary) hypertension: Secondary | ICD-10-CM | POA: Diagnosis not present

## 2016-10-13 NOTE — Patient Instructions (Signed)
Medication Instructions:  Your physician recommends that you continue on your current medications as directed. Please refer to the Current Medication list given to you today.   Labwork: None Ordered   Testing/Procedures: Your physician has requested that you have a lexiscan myoview. For further information please visit [email or web address]. Please follow instruction sheet, as given.   Follow-Up: Your physician wants you to follow-up in: 1 year with Dr. Nahser.  You will receive a reminder letter in the mail two months in advance. If you don't receive a letter, please call our office to schedule the follow-up appointment.   If you need a refill on your cardiac medications before your next appointment, please call your pharmacy.   Thank you for choosing [facility]! Serafino Burciaga, RN [phone]    
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
## 2016-10-14 ENCOUNTER — Telehealth (HOSPITAL_COMMUNITY): Payer: Self-pay | Admitting: *Deleted

## 2016-10-14 NOTE — Telephone Encounter (Signed)
Patient given detailed instructions per Myocardial Perfusion Study Information Sheet for the test on 10/16/16 at 1000. Patient notified to arrive 15 minutes early and that it is imperative to arrive on time for appointment to keep from having the test rescheduled.  If you need to cancel or reschedule your appointment, please call the office within 24 hours of your appointment. Failure to do so may result in a cancellation of your appointment, and a $50 no show fee. Patient verbalized understanding.Bless Belshe, Ranae Palms

## 2016-10-16 ENCOUNTER — Ambulatory Visit (HOSPITAL_COMMUNITY): Payer: Medicare Other | Attending: Cardiovascular Disease

## 2016-10-16 DIAGNOSIS — I252 Old myocardial infarction: Secondary | ICD-10-CM | POA: Insufficient documentation

## 2016-10-16 DIAGNOSIS — I4891 Unspecified atrial fibrillation: Secondary | ICD-10-CM | POA: Diagnosis not present

## 2016-10-16 DIAGNOSIS — R0789 Other chest pain: Secondary | ICD-10-CM | POA: Diagnosis not present

## 2016-10-16 DIAGNOSIS — E119 Type 2 diabetes mellitus without complications: Secondary | ICD-10-CM | POA: Insufficient documentation

## 2016-10-16 LAB — MYOCARDIAL PERFUSION IMAGING
LV dias vol: 117 mL (ref 46–106)
LV sys vol: 64 mL
Peak HR: 95 {beats}/min
RATE: 0.4
Rest HR: 68 {beats}/min
SDS: 4
SRS: 12
SSS: 16
TID: 1.09

## 2016-10-16 MED ORDER — TECHNETIUM TC 99M TETROFOSMIN IV KIT
10.1000 | PACK | Freq: Once | INTRAVENOUS | Status: AC | PRN
  Administered 2016-10-16: 10.1 via INTRAVENOUS
  Filled 2016-10-16: qty 11

## 2016-10-16 MED ORDER — REGADENOSON 0.4 MG/5ML IV SOLN
0.4000 mg | Freq: Once | INTRAVENOUS | Status: AC
  Administered 2016-10-16: 0.4 mg via INTRAVENOUS

## 2016-10-16 MED ORDER — TECHNETIUM TC 99M TETROFOSMIN IV KIT
33.0000 | PACK | Freq: Once | INTRAVENOUS | Status: AC | PRN
  Administered 2016-10-16: 33 via INTRAVENOUS
  Filled 2016-10-16: qty 33

## 2016-10-18 ENCOUNTER — Other Ambulatory Visit: Payer: Self-pay | Admitting: Cardiovascular Disease

## 2016-10-23 ENCOUNTER — Encounter (INDEPENDENT_AMBULATORY_CARE_PROVIDER_SITE_OTHER): Payer: Self-pay | Admitting: Specialist

## 2016-10-23 ENCOUNTER — Ambulatory Visit (INDEPENDENT_AMBULATORY_CARE_PROVIDER_SITE_OTHER): Payer: Medicare Other

## 2016-10-23 ENCOUNTER — Ambulatory Visit (INDEPENDENT_AMBULATORY_CARE_PROVIDER_SITE_OTHER): Payer: Medicare Other | Admitting: Specialist

## 2016-10-23 DIAGNOSIS — M545 Low back pain: Secondary | ICD-10-CM

## 2016-10-23 MED ORDER — HYDROCODONE-ACETAMINOPHEN 5-325 MG PO TABS: 1.0000 | ORAL_TABLET | Freq: Three times a day (TID) | ORAL | 0 refills | Status: DC | PRN

## 2016-10-23 MED ORDER — KETOROLAC TROMETHAMINE 30 MG/ML IM SOLN: 30.0000 mg | Freq: Once | INTRAMUSCULAR | Status: AC

## 2016-10-23 MED ORDER — METHOCARBAMOL 500 MG PO TABS: 500.0000 mg | ORAL_TABLET | Freq: Three times a day (TID) | ORAL | 0 refills | Status: DC | PRN

## 2016-10-23 NOTE — Progress Notes (Signed)
Office Visit Note   Patient: Melanie [name]           Date of Birth: Sep 30, 1945           MRN: [id number] Visit Date: 10/23/2016              Requested by: Marton Redwood, MD [address] [place], [place] [address] PCP: Marton Redwood, MD   Assessment & Plan: Visit Diagnoses:  1. Left low back pain, unspecified chronicity, with sciatica presence unspecified     Plan: The try to help give patient some relief of her low back and left hip and thigh pain we gave her a Toradol 30 mg IM injection today. This was done in the left buttock. Also gave patient Robaxin and Norco. We'll follow-up the office in 2 weeks for recheck but I advised patient to call me on Monday to let me know how she is feeling. If she still continues to have ongoing symptoms we'll go ahead and schedule lumbar spine MRI to rule out HNP/stenosis. Patient voices understanding of plan.  Follow-Up Instructions: Return in about 2 weeks (around 11/06/2016).   Orders:  Orders Placed This Encounter  Procedures  . XR Lumbar Spine Complete   Meds ordered this encounter  Medications  . ketorolac (TORADOL) injection 30 mg  . methocarbamol (ROBAXIN) 500 MG tablet    Sig: Take 1 tablet (500 mg total) by mouth every 8 (eight) hours as needed for muscle spasms.    Dispense:  40 tablet    Refill:  0  . HYDROcodone-acetaminophen (NORCO/VICODIN) 5-325 MG tablet    Sig: Take 1 tablet by mouth every 8 (eight) hours as needed for moderate pain.    Dispense:  30 tablet    Refill:  0      Procedures: No procedures performed   Clinical Data: No additional findings.   Subjective: Chief Complaint  Patient presents with  . Lower Back - Pain    [name] is here for pain in her low back that radiates into left buttock and groin.  She states that I started yesterday, and it woke her up this morning about 330am and she couldn't roll over ot get out of bed.  She states that she took 4 Ibuprofen this morning and she states  that it has eased up some.   Symptoms started a couple of days ago. Pain in the low back that radiates into the left buttock and left thigh. Nothing below the knee. No symptoms on the right. Denies injury. She is status post L2-3 decompression by Dr. Lorin Mercy November 2014. States that she is doing very well until a couple of days ago  Review of Systems  Constitutional: Positive for activity change.  HENT: Negative.   Respiratory: Negative.   Gastrointestinal: Negative.   Musculoskeletal: Positive for back pain and gait problem.  Psychiatric/Behavioral: Negative.      Objective: Vital Signs: There were no vitals taken for this visit.  Physical Exam  Constitutional: She is oriented to person, place, and time. No distress.  HENT:  Head: Normocephalic and atraumatic.  Eyes: EOM are normal. Pupils are equal, round, and reactive to light.  Pulmonary/Chest: No respiratory distress.  Musculoskeletal:  Patient has left-sided lumbar paraspinal tennis. Positive left sciatic notch tenderness. Tender over the left hip greater bursa. Pain with left straight leg raise. No focal motor deficit.  Neurological: She is alert and oriented to person, place, and time.  Skin: Skin is warm and dry.  Psychiatric:  She has a normal mood and affect.    Ortho Exam  Specialty Comments:  No specialty comments available.  Imaging: Xr Lumbar Spine Complete  Result Date: 10/23/2016 X-rays lumbar spine show multilevel lumbar spondylosis. There is disc space collapse. Facet changes. No acute findings.    PMFS History: Patient Active Problem List   Diagnosis Date Noted  . Cholelithiasis with chronic cholecystitis 12/28/2014  . Cholelithiasis with cholecystitis 12/26/2014  . Unspecified vitamin D deficiency 09/27/2014  . Diabetic neuropathy ([place]) 10/06/2013  . B12 deficiency 09/27/2013  . Spinal stenosis, lumbar region, with neurogenic claudication 07/20/2013  . HTN (hypertension)   . Bradycardia   . DM  (diabetes mellitus) ([place])   . PAF (paroxysmal atrial fibrillation) ([facility])    Past Medical History:  Diagnosis Date  . Arthritis   . Bradycardia   . Depression    under control  . DM (diabetes mellitus) ([place])    type 2  . Family history of anesthesia complication    mother-nausea and vomiting  . GERD (gastroesophageal reflux disease)   . Headache   . HTN (hypertension)   . Neuropathy ([facility])    legs and feet  . PAF (paroxysmal atrial fibrillation) ([facility]) 10 years ago   no problems since, due to stress  . Situational stress    "was taking care of sick husband"    Family History  Problem Relation Age of Onset  . Heart attack Father     Deceased, 61  . Other Mother     Deceased, 78  . Neuropathy Mother   . Hypertension Mother   . Ovarian cancer Sister     Deceased, 18  . Neuropathy Sister   . Multiple sclerosis Sister   . Stroke Sister   . Hypertension Sister     Past Surgical History:  Procedure Laterality Date  . ABDOMINAL HYSTERECTOMY  1980   partial  . APPENDECTOMY  1980  . BACK SURGERY  2004  . CARPAL TUNNEL RELEASE Bilateral 2003, 2004  . CHOLECYSTECTOMY N/A 12/28/2014   Procedure: LAPAROSCOPIC CHOLECYSTECTOMY WITH INTRAOPERATIVE CHOLANGIOGRAM;  Surgeon: Armandina Gemma, MD;  Location: [facility];  Service: General;  Laterality: N/A;  . COLONOSCOPY  8 years ago  . JOINT REPLACEMENT Bilateral 2007  . KNEE ARTHROSCOPY Bilateral 1990's  . LUMBAR LAMINECTOMY N/A 07/20/2013   Procedure: MICRODISCECTOMY LUMBAR LAMINECTOMY;  Surgeon: [name], MD;  Location: [facility];  Service: Orthopedics;  Laterality: N/A;  L2-3 Decompression   Social History   Occupational History  . Not on file.   Social History Main Topics  . Smoking status: Current Every Day Smoker    Packs/day: 0.50    Years: 30.00    Types: Cigarettes  . Smokeless tobacco: Never Used  . Alcohol use No  . Drug use: No  . Sexual activity: Not Currently

## 2016-10-27 ENCOUNTER — Other Ambulatory Visit (INDEPENDENT_AMBULATORY_CARE_PROVIDER_SITE_OTHER): Payer: Self-pay | Admitting: Specialist

## 2016-10-27 ENCOUNTER — Telehealth (INDEPENDENT_AMBULATORY_CARE_PROVIDER_SITE_OTHER): Payer: Self-pay | Admitting: Orthopaedic Surgery

## 2016-10-27 NOTE — Telephone Encounter (Signed)
Did you read last and only office note?, Melanie [name] requested she call,this note is for Melanie [name]. jen

## 2016-10-27 NOTE — Telephone Encounter (Signed)
Patient called concerning her back pain. She would like some advice on how to control the pain

## 2016-10-27 NOTE — Telephone Encounter (Signed)
This is Dr. Nitka's patient. 

## 2016-10-27 NOTE — Telephone Encounter (Signed)
Patient called concerning her back pain. She would like some advice on how to control the pain. CB # [phone]

## 2016-10-28 ENCOUNTER — Ambulatory Visit (INDEPENDENT_AMBULATORY_CARE_PROVIDER_SITE_OTHER): Payer: Medicare Other | Admitting: Surgery

## 2016-10-28 ENCOUNTER — Other Ambulatory Visit (INDEPENDENT_AMBULATORY_CARE_PROVIDER_SITE_OTHER): Payer: Self-pay | Admitting: Radiology

## 2016-10-28 DIAGNOSIS — M545 Low back pain: Secondary | ICD-10-CM

## 2016-10-28 NOTE — Telephone Encounter (Signed)
Per Jeneen Rinks, Order MRI scan of lumbar. She asked for something stronger--No per Jeneen Rinks due to if it is surgical issue we will not be able to control afterwards, to get scan and see what it shows first.

## 2016-10-31 DIAGNOSIS — M5430 Sciatica, unspecified side: Secondary | ICD-10-CM | POA: Diagnosis not present

## 2016-11-02 ENCOUNTER — Ambulatory Visit
Admission: RE | Admit: 2016-11-02 | Discharge: 2016-11-02 | Disposition: A | Discharge status: Home / Self Care | Payer: Medicare Other | Source: Ambulatory Visit | Attending: Orthopaedic Surgery | Admitting: Orthopaedic Surgery

## 2016-11-02 DIAGNOSIS — M545 Low back pain: Secondary | ICD-10-CM

## 2016-11-02 DIAGNOSIS — M48061 Spinal stenosis, lumbar region without neurogenic claudication: Secondary | ICD-10-CM | POA: Diagnosis not present

## 2016-11-05 ENCOUNTER — Ambulatory Visit (INDEPENDENT_AMBULATORY_CARE_PROVIDER_SITE_OTHER): Payer: Medicare Other | Admitting: Orthopaedic Surgery

## 2016-11-05 DIAGNOSIS — M5126 Other intervertebral disc displacement, lumbar region: Secondary | ICD-10-CM

## 2016-11-05 NOTE — Progress Notes (Signed)
Office Visit Note   Patient: Melanie [name]           Date of Birth: November 14, 1945           MRN: [id number] Visit Date: 11/05/2016              Requested by: Marton Redwood, MD [address] [place], [place] [address] PCP: Marton Redwood, MD   Assessment & Plan: Visit Diagnoses:  1. Herniated lumbar intervertebral disc     Plan: She has herniated disc from L3/4 HNP with cephalad migration on the left which was causing radiculopathy. She's actually gotten better since a Kenalog injection although she has had some higher sugar readings. I'll recheck her in the 3 weeks. If she is not better 2 weeks she'll call and we can now put on the schedule for operative intervention.  Follow-Up Instructions: Return in about 3 weeks (around 11/26/2016).   Orders:  No orders of the defined types were placed in this encounter.  No orders of the defined types were placed in this encounter.     Procedures: No procedures performed   Clinical Data: No additional findings.   Subjective: Chief Complaint  Patient presents with  . Lower Back - Pain    Pt here for 2 week ROV for MRI Review which was done on Sunday 11/02/16, pt in pain today L hip radiates to knee, went to [facility] and was given an injection.     Review of Systems  Constitutional: Negative for chills and diaphoresis.  HENT: Negative for ear discharge, ear pain and nosebleeds.   Eyes: Negative for discharge and visual disturbance.  Respiratory: Negative for cough, choking and shortness of breath.   Cardiovascular: Negative for chest pain and palpitations.  Gastrointestinal: Negative for abdominal distention and abdominal pain.  Endocrine: Negative for cold intolerance and heat intolerance.  Genitourinary: Negative for flank pain and hematuria.  Musculoskeletal:       Previous knee surgery by Dr. Lorin Mercy. Acute severe back pain that began the 3 weeks ago severe with the left quad weakness.  Skin: Negative for rash and  wound.  Neurological: Negative for seizures and speech difficulty.  Hematological: Negative for adenopathy. Does not bruise/bleed easily.  Psychiatric/Behavioral: Negative for agitation and suicidal ideas.     Objective: Vital Signs: There were no vitals taken for this visit.  Physical Exam  Constitutional: She is oriented to person, place, and time. She appears well-developed.  HENT:  Head: Normocephalic.  Right Ear: External ear normal.  Left Ear: External ear normal.  Eyes: Pupils are equal, round, and reactive to light.  Neck: No tracheal deviation present. No thyromegaly present.  Cardiovascular: Normal rate.   Pulmonary/Chest: Effort normal.  Abdominal: Soft.  Musculoskeletal:  Good quad strength positive reverse straight leg raising. Anterior tib EHL is strong. Some decreased sensation L3 dermatome but she states it's actually better than it was last week.  Neurological: She is alert and oriented to person, place, and time.  Skin: Skin is warm and dry.  Psychiatric: She has a normal mood and affect. Her behavior is normal.    Ortho Exam  Specialty Comments:  No specialty comments available.  Imaging: No results found.   PMFS History: Patient Active Problem List   Diagnosis Date Noted  . Cholelithiasis with chronic cholecystitis 12/28/2014  . Cholelithiasis with cholecystitis 12/26/2014  . Unspecified vitamin D deficiency 09/27/2014  . Diabetic neuropathy ([place]) 10/06/2013  . B12 deficiency 09/27/2013  . Spinal stenosis, lumbar region,  with neurogenic claudication 07/20/2013  . HTN (hypertension)   . Bradycardia   . DM (diabetes mellitus) ([place])   . PAF (paroxysmal atrial fibrillation) ([facility])    Past Medical History:  Diagnosis Date  . Arthritis   . Bradycardia   . Depression    under control  . DM (diabetes mellitus) ([place])    type 2  . Family history of anesthesia complication    mother-nausea and vomiting  . GERD (gastroesophageal reflux disease)   .  Headache   . HTN (hypertension)   . Neuropathy ([facility])    legs and feet  . PAF (paroxysmal atrial fibrillation) ([facility]) 10 years ago   no problems since, due to stress  . Situational stress    "was taking care of sick husband"    Family History  Problem Relation Age of Onset  . Heart attack Father     Deceased, 13  . Other Mother     Deceased, 55  . Neuropathy Mother   . Hypertension Mother   . Ovarian cancer Sister     Deceased, 51  . Neuropathy Sister   . Multiple sclerosis Sister   . Stroke Sister   . Hypertension Sister     Past Surgical History:  Procedure Laterality Date  . ABDOMINAL HYSTERECTOMY  1980   partial  . APPENDECTOMY  1980  . BACK SURGERY  2004  . CARPAL TUNNEL RELEASE Bilateral 2003, 2004  . CHOLECYSTECTOMY N/A 12/28/2014   Procedure: LAPAROSCOPIC CHOLECYSTECTOMY WITH INTRAOPERATIVE CHOLANGIOGRAM;  Surgeon: Armandina Gemma, MD;  [facility];  Service: General;  Laterality: N/A;  . COLONOSCOPY  8 years ago  . JOINT REPLACEMENT Bilateral 2007  . KNEE ARTHROSCOPY Bilateral 1990's  . LUMBAR LAMINECTOMY N/A 07/20/2013   Procedure: MICRODISCECTOMY LUMBAR LAMINECTOMY;  Surgeon: [name], MD;  Location: [facility];  Service: Orthopedics;  Laterality: N/A;  L2-3 Decompression   Social History   Occupational History  . Not on file.   Social History Main Topics  . Smoking status: Current Every Day Smoker    Packs/day: 0.50    Years: 30.00    Types: Cigarettes  . Smokeless tobacco: Never Used  . Alcohol use No  . Drug use: No  . Sexual activity: Not Currently

## 2016-11-07 ENCOUNTER — Telehealth (INDEPENDENT_AMBULATORY_CARE_PROVIDER_SITE_OTHER): Payer: Self-pay | Admitting: Orthopaedic Surgery

## 2016-11-07 NOTE — Telephone Encounter (Signed)
Patient wanted to ask you a few questions. CB # [phone]

## 2016-11-10 NOTE — Telephone Encounter (Signed)
I called patient. She has had increasing pain and states that she cannot handle this. She was in tears while on the phone. She states that she wants you to schedule surgery. She has no life and can hardly walk around. Please advise.

## 2016-11-10 NOTE — Telephone Encounter (Signed)
I left voicemail for patient. I will attempt to call her again this afternoon.

## 2016-11-11 MED ORDER — HYDROCODONE-ACETAMINOPHEN 5-325 MG PO TABS: 1.0000 | ORAL_TABLET | Freq: Three times a day (TID) | ORAL | 0 refills | Status: DC | PRN

## 2016-11-11 NOTE — Telephone Encounter (Signed)
Per Dr. Lorin Mercy, refill Hydrocodone.  I called patient and advised script at front for pick up

## 2016-11-11 NOTE — Telephone Encounter (Signed)
Blue sheet done

## 2016-11-11 NOTE — Addendum Note (Signed)
Addended by: Meyer Cory on: 11/11/2016 10:28 AM   Modules accepted: Orders

## 2016-11-11 NOTE — Telephone Encounter (Signed)
Patient would like to know if she can have something for pain while awaiting surgery. Please advise.

## 2016-11-12 NOTE — Telephone Encounter (Signed)
Melanie [name] stopped by the office yesterday to pick up her pain med rx.  I advised her that I had her order for surgery and would call her soon to set this up.

## 2016-11-14 ENCOUNTER — Telehealth (INDEPENDENT_AMBULATORY_CARE_PROVIDER_SITE_OTHER): Payer: Self-pay | Admitting: Radiology

## 2016-11-14 NOTE — Telephone Encounter (Signed)
Patient left voicemail on my line wanting to know whether we had made any progress on scheduling her surgery. Please call her to advise.

## 2016-11-18 ENCOUNTER — Telehealth (INDEPENDENT_AMBULATORY_CARE_PROVIDER_SITE_OTHER): Payer: Self-pay | Admitting: Orthopaedic Surgery

## 2016-11-18 NOTE — Telephone Encounter (Signed)
Patient called needing to ask you as few questions. CB # [phone]

## 2016-11-19 ENCOUNTER — Other Ambulatory Visit (INDEPENDENT_AMBULATORY_CARE_PROVIDER_SITE_OTHER): Payer: Self-pay | Admitting: Orthopaedic Surgery

## 2016-11-19 DIAGNOSIS — M5126 Other intervertebral disc displacement, lumbar region: Secondary | ICD-10-CM

## 2016-11-19 NOTE — Telephone Encounter (Signed)
I returned patient's call. She is wondering about surgery. I advised that we may be waiting on cardiac clearance but that I would check on it and return her call.

## 2016-11-19 NOTE — Telephone Encounter (Signed)
Melanie [name] is going to call patient today to work on scheduling surgery. I called patient back to advise.

## 2016-11-19 NOTE — Telephone Encounter (Signed)
I spoke with Melanie [name]. We have her scheduled for surgery on Friday 11/28/2016. All surgery information given to patient.

## 2016-11-26 ENCOUNTER — Ambulatory Visit (INDEPENDENT_AMBULATORY_CARE_PROVIDER_SITE_OTHER): Payer: Medicare Other | Admitting: Orthopaedic Surgery

## 2016-11-27 ENCOUNTER — Other Ambulatory Visit (HOSPITAL_COMMUNITY): Payer: Self-pay | Admitting: *Deleted

## 2016-11-27 ENCOUNTER — Encounter (HOSPITAL_COMMUNITY): Payer: Self-pay

## 2016-11-27 ENCOUNTER — Encounter (HOSPITAL_COMMUNITY)
Admission: RE | Admit: 2016-11-27 | Discharge: 2016-11-27 | Disposition: A | Discharge status: Home / Self Care | Payer: Medicare Other | Source: Ambulatory Visit | Attending: Orthopaedic Surgery | Admitting: Orthopaedic Surgery

## 2016-11-27 HISTORY — DX: Dyspnea, unspecified: R06.00

## 2016-11-27 LAB — CBC
HCT: 39.9 % (ref 36.0–46.0)
Hemoglobin: 13.2 g/dL (ref 12.0–15.0)
MCH: 32 pg (ref 26.0–34.0)
MCHC: 33.1 g/dL (ref 30.0–36.0)
MCV: 96.6 fL (ref 78.0–100.0)
Platelets: 236 10*3/uL (ref 150–400)
RBC: 4.13 MIL/uL (ref 3.87–5.11)
RDW: 14.3 % (ref 11.5–15.5)
WBC: 8.3 10*3/uL (ref 4.0–10.5)

## 2016-11-27 LAB — COMPREHENSIVE METABOLIC PANEL
ALT: 14 U/L (ref 14–54)
AST: 16 U/L (ref 15–41)
Albumin: 3.8 g/dL (ref 3.5–5.0)
Alkaline Phosphatase: 59 U/L (ref 38–126)
Anion gap: 10 (ref 5–15)
BUN: 13 mg/dL (ref 6–20)
CO2: 27 mmol/L (ref 22–32)
Calcium: 8.9 mg/dL (ref 8.9–10.3)
Chloride: 95 mmol/L — ABNORMAL LOW (ref 101–111)
Creatinine, Ser: 1.66 mg/dL — ABNORMAL HIGH (ref 0.44–1.00)
GFR calc Af Amer: 36 mL/min — ABNORMAL LOW (ref >60)
GFR calc non Af Amer: 31 mL/min — ABNORMAL LOW (ref >60)
Glucose, Bld: 88 mg/dL (ref 65–99)
Potassium: 4.1 mmol/L (ref 3.5–5.1)
Sodium: 132 mmol/L — ABNORMAL LOW (ref 135–145)
Total Bilirubin: 0.3 mg/dL (ref 0.3–1.2)
Total Protein: 6.4 g/dL — ABNORMAL LOW (ref 6.5–8.1)

## 2016-11-27 LAB — GLUCOSE, CAPILLARY: Glucose-Capillary: 115 mg/dL — ABNORMAL HIGH (ref 65–99)

## 2016-11-27 LAB — SURGICAL PCR SCREEN
MRSA, PCR: NEGATIVE
Staphylococcus aureus: NEGATIVE

## 2016-11-27 MED ORDER — CEFAZOLIN SODIUM-DEXTROSE 2-4 GM/100ML-% IV SOLN
2.0000 g | INTRAVENOUS | Status: AC
  Administered 2016-11-28: 2 g via INTRAVENOUS
  Filled 2016-11-27: qty 100

## 2016-11-27 NOTE — Anesthesia Preprocedure Evaluation (Addendum)
Anesthesia Evaluation    Reviewed: Allergy & Precautions, Patient's Chart, lab work & pertinent test results  Airway Mallampati: II  TM Distance: <3 FB Neck ROM: Full    Dental   Pulmonary neg pulmonary ROS, shortness of breath, Current Smoker,    breath sounds clear to auscultation       Cardiovascular hypertension, negative cardio ROS   Rhythm:Regular Rate:Normal     Neuro/Psych  Headaches, PSYCHIATRIC DISORDERS Depression negative neurological ROS     GI/Hepatic negative GI ROS, Neg liver ROS, GERD (occasional)  ,  Endo/Other  negative endocrine ROSdiabetes  Renal/GU negative Renal ROS     Musculoskeletal negative musculoskeletal ROS (+) Arthritis ,   Abdominal   Peds  Hematology negative hematology ROS (+)   Anesthesia Other Findings Day of surgery medications reviewed with the patient.  Reproductive/Obstetrics                            Anesthesia Physical Anesthesia Plan  ASA: III  Anesthesia Plan: General   Post-op Pain Management:    Induction: Intravenous  Airway Management Planned:   Additional Equipment:   Intra-op Plan:   Post-operative Plan: Extubation in OR  Informed Consent: I have reviewed the patients History and Physical, chart, labs and discussed the procedure including the risks, benefits and alternatives for the proposed anesthesia with the patient or authorized representative who has indicated his/her understanding and acceptance.   Dental advisory given  Plan Discussed with:   Anesthesia Plan Comments: ( Nuclear stress EF: 45%. Stress test 2/18:  ST segment depression of 0.5 mm was noted during stress in the aVF, V5, V6, II and III leads.  Defect 1: There is a medium defect of severe severity present in the basal inferior location.  Defect 2: There is a small defect of moderate severity present in the apex location.  This is a low risk  study.  Findings consistent with prior myocardial infarction with peri-infarct ischemia.  The left ventricular ejection fraction is mildly decreased (45-54%).   Low risk stress nuclear study with apical thinning and prior inferior basal infarct with mild peri-infarct ischemia; EF 45 with akinesis of the basal inferior wall; mild LVE. Cardiology eval for procedure:  Phone call with Dr. Lorin Mercy and anesthesiology  Jolayne Haines had a low risk myoview on Feb. 1. 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  She needs to have back surgery / decompression   She is at low risk of CV complications during or after surgery . She has not had any angina or cardiac complications. We are available to see her following surgery if needed. )      Anesthesia Quick Evaluation

## 2016-11-27 NOTE — Pre-Procedure Instructions (Addendum)
Melanie [name]  11/27/2016    Your procedure is scheduled on Friday, November 28, 2016 at 7:30 AM.   Report to [facility] Entrance "A" Admitting Office at 5:30 AM.   Call this number if you have problems the morning of surgery: [phone]    Remember:  Do not eat food or drink liquids after midnight tonight.  Take these medicines the morning of surgery with A SIP OF WATER: Bisoprolol-Hydrochlorothiazide (Ziac), Gabapentin (Neurontin)  Do not use Aspirin or NSAIDS (Ibuprofen, Aleve, etc) prior to surgery.  Do not smoke as of now prior to surgery.   How to Manage Your Diabetes Before Surgery   Why is it important to control my blood sugar before and after surgery?   Improving blood sugar levels before and after surgery helps healing and can limit problems.  A way of improving blood sugar control is eating a healthy diet by:  - Eating less sugar and carbohydrates  - Increasing activity/exercise  - Talk with your doctor about reaching your blood sugar goals  High blood sugars (greater than 180 mg/dL) can raise your risk of infections and slow down your recovery so you will need to focus on controlling your diabetes during the weeks before surgery.  Make sure that the doctor who takes care of your diabetes knows about your planned surgery including the date and location.  How do I manage my blood sugars before surgery?   Check your blood sugar at least 4 times a day, 2 days before surgery to make sure that they are not too high or low.  Check your blood sugar the morning of your surgery when you wake up and every 2 hours until you get to the Short-Stay unit.  Treat a low blood sugar (less than 70 mg/dL) with 1/2 cup of clear juice (cranberry or apple), 4 glucose tablets, OR glucose gel.  Recheck blood sugar in 15 minutes after treatment (to make sure it is greater than 70 mg/dL).  If blood sugar is not greater than 70 mg/dL on re-check, call [phone] for  further instructions.   Report your blood sugar to the Short-Stay nurse when you get to Short-Stay.  References:  [facility], 2007 "How to Manage your Diabetes Before and After Surgery".  What do I do about my diabetes medications?  Do not take your evening dose of Glipizide today.   Do not take oral diabetes medicines (pills) the morning of surgery.   Do not wear jewelry, make-up or nail polish.  Do not wear lotions, powders or perfumes.  Do not shave 48 hours prior to surgery.    Do not bring valuables to the hospital.  [facility] is not responsible for any belongings or valuables.  Contacts, dentures or bridgework may not be worn into surgery.  Leave your suitcase in the car.  After surgery it may be brought to your room.  For patients admitted to the hospital, discharge time will be determined by your treatment team.   Special instructions:  [facility] - Preparing for Surgery  Before surgery, you can play an important role.  Because skin is not sterile, your skin needs to be as free of germs as possible.  You can reduce the number of germs on you skin by washing with CHG (chlorahexidine gluconate) soap before surgery.  CHG is an antiseptic cleaner which kills germs and bonds with the skin to continue killing germs even after washing.  Please DO NOT  use if you have an allergy to CHG or antibacterial soaps.  If your skin becomes reddened/irritated stop using the CHG and inform your nurse when you arrive at Short Stay.  Do not shave (including legs and underarms) for at least 48 hours prior to the first CHG shower.  You may shave your face.  Please follow these instructions carefully:   1.  Shower with CHG Soap the night before surgery and the                    morning of Surgery.  2.  If you choose to wash your hair, wash your hair first as usual with your       normal shampoo.  3.  After you shampoo, rinse your hair and body thoroughly to remove  the shampoo.  4.  Use CHG as you would any other liquid soap.  You can apply chg directly       to the skin and wash gently with scrungie or a clean washcloth.  5.  Apply the CHG Soap to your body ONLY FROM THE NECK DOWN.        Do not use on open wounds or open sores.  Avoid contact with your eyes, ears, mouth and genitals (private parts).  Wash genitals (private parts) with your normal soap.  6.  Wash thoroughly, paying special attention to the area where your surgery        will be performed.  7.  Thoroughly rinse your body with warm water from the neck down.  8.  DO NOT shower/wash with your normal soap after using and rinsing off       the CHG Soap.  9.  Pat yourself dry with a clean towel.            10.  Wear clean pajamas.            11.  Place clean sheets on your bed the night of your first shower and do not        sleep with pets.  Day of Surgery  Do not apply any lotions the morning of surgery.  Please wear clean clothes to the hospital.   Please read over the fact sheets that you were given.

## 2016-11-27 NOTE — Progress Notes (Signed)
Pt has hx of A-fib. Saw Dr. Acie Fredrickson in January, 2018 and had a stress test on 10/16/16 which he states was normal. Pt denies any recent chest pain or sob. Pt is a type 2 diabetic. States last A1C was 4 months ago, will have one done today. Pt states her fasting blood sugar is usually around 135. Pt instructed not to smoke as of now until after surgery.

## 2016-11-28 ENCOUNTER — Telehealth: Payer: Self-pay | Admitting: Cardiovascular Disease

## 2016-11-28 ENCOUNTER — Encounter (HOSPITAL_COMMUNITY): Payer: Self-pay | Admitting: *Deleted

## 2016-11-28 ENCOUNTER — Inpatient Hospital Stay (HOSPITAL_COMMUNITY)
Admission: RE | Admit: 2016-11-28 | Discharge: 2016-12-01 | DRG: 519 | Disposition: A | Discharge status: Skilled Nursing Facility | Payer: Medicare Other | Source: Ambulatory Visit | Attending: Orthopaedic Surgery | Admitting: Orthopaedic Surgery

## 2016-11-28 ENCOUNTER — Ambulatory Visit (HOSPITAL_COMMUNITY): Payer: Medicare Other | Admitting: Anesthesiology

## 2016-11-28 ENCOUNTER — Encounter (HOSPITAL_COMMUNITY)
Admission: RE | Disposition: A | Discharge status: Skilled Nursing Facility | Payer: Self-pay | Source: Ambulatory Visit | Attending: Orthopaedic Surgery

## 2016-11-28 ENCOUNTER — Ambulatory Visit (HOSPITAL_COMMUNITY): Payer: Medicare Other

## 2016-11-28 DIAGNOSIS — I10 Essential (primary) hypertension: Secondary | ICD-10-CM | POA: Diagnosis present

## 2016-11-28 DIAGNOSIS — I480 Paroxysmal atrial fibrillation: Secondary | ICD-10-CM | POA: Diagnosis not present

## 2016-11-28 DIAGNOSIS — E1142 Type 2 diabetes mellitus with diabetic polyneuropathy: Secondary | ICD-10-CM | POA: Diagnosis present

## 2016-11-28 DIAGNOSIS — Z90710 Acquired absence of both cervix and uterus: Secondary | ICD-10-CM | POA: Diagnosis not present

## 2016-11-28 DIAGNOSIS — M5126 Other intervertebral disc displacement, lumbar region: Secondary | ICD-10-CM

## 2016-11-28 DIAGNOSIS — F329 Major depressive disorder, single episode, unspecified: Secondary | ICD-10-CM | POA: Diagnosis not present

## 2016-11-28 DIAGNOSIS — M5116 Intervertebral disc disorders with radiculopathy, lumbar region: Secondary | ICD-10-CM | POA: Diagnosis not present

## 2016-11-28 DIAGNOSIS — F17210 Nicotine dependence, cigarettes, uncomplicated: Secondary | ICD-10-CM | POA: Diagnosis present

## 2016-11-28 DIAGNOSIS — Z419 Encounter for procedure for purposes other than remedying health state, unspecified: Secondary | ICD-10-CM

## 2016-11-28 DIAGNOSIS — Z8249 Family history of ischemic heart disease and other diseases of the circulatory system: Secondary | ICD-10-CM

## 2016-11-28 DIAGNOSIS — M48061 Spinal stenosis, lumbar region without neurogenic claudication: Secondary | ICD-10-CM | POA: Diagnosis present

## 2016-11-28 DIAGNOSIS — M48062 Spinal stenosis, lumbar region with neurogenic claudication: Secondary | ICD-10-CM

## 2016-11-28 DIAGNOSIS — Z888 Allergy status to other drugs, medicaments and biological substances status: Secondary | ICD-10-CM

## 2016-11-28 DIAGNOSIS — M519 Unspecified thoracic, thoracolumbar and lumbosacral intervertebral disc disorder: Secondary | ICD-10-CM | POA: Diagnosis not present

## 2016-11-28 DIAGNOSIS — IMO0002 Reserved for concepts with insufficient information to code with codable children: Secondary | ICD-10-CM

## 2016-11-28 DIAGNOSIS — E871 Hypo-osmolality and hyponatremia: Secondary | ICD-10-CM | POA: Diagnosis not present

## 2016-11-28 DIAGNOSIS — E119 Type 2 diabetes mellitus without complications: Secondary | ICD-10-CM | POA: Diagnosis not present

## 2016-11-28 DIAGNOSIS — Z79899 Other long term (current) drug therapy: Secondary | ICD-10-CM

## 2016-11-28 DIAGNOSIS — Z7984 Long term (current) use of oral hypoglycemic drugs: Secondary | ICD-10-CM

## 2016-11-28 DIAGNOSIS — K219 Gastro-esophageal reflux disease without esophagitis: Secondary | ICD-10-CM | POA: Diagnosis present

## 2016-11-28 DIAGNOSIS — Z7982 Long term (current) use of aspirin: Secondary | ICD-10-CM

## 2016-11-28 HISTORY — PX: LAMINECTOMY: SHX219

## 2016-11-28 HISTORY — PX: LUMBAR LAMINECTOMY/DECOMPRESSION MICRODISCECTOMY: SHX5026

## 2016-11-28 LAB — GLUCOSE, CAPILLARY
Glucose-Capillary: 114 mg/dL — ABNORMAL HIGH (ref 65–99)
Glucose-Capillary: 118 mg/dL — ABNORMAL HIGH (ref 65–99)
Glucose-Capillary: 143 mg/dL — ABNORMAL HIGH (ref 65–99)
Glucose-Capillary: 108 mg/dL — ABNORMAL HIGH (ref 65–99)
Glucose-Capillary: 126 mg/dL — ABNORMAL HIGH (ref 65–99)

## 2016-11-28 LAB — HEMOGLOBIN A1C
Hgb A1c MFr Bld: 6.5 % — ABNORMAL HIGH (ref 4.8–5.6)
Mean Plasma Glucose: 140 mg/dL

## 2016-11-28 SURGERY — LUMBAR LAMINECTOMY/DECOMPRESSION MICRODISCECTOMY
Anesthesia: General

## 2016-11-28 MED ORDER — SUCCINYLCHOLINE CHLORIDE 20 MG/ML IJ SOLN
INTRAMUSCULAR | Status: DC | PRN
  Administered 2016-11-28: 100 mg via INTRAVENOUS

## 2016-11-28 MED ORDER — METHOCARBAMOL 1000 MG/10ML IJ SOLN
500.0000 mg | Freq: Four times a day (QID) | INTRAVENOUS | Status: DC | PRN
  Administered 2016-11-28: 500 mg via INTRAVENOUS
  Filled 2016-11-28 (×2): qty 5

## 2016-11-28 MED ORDER — SUGAMMADEX SODIUM 200 MG/2ML IV SOLN
INTRAVENOUS | Status: AC
  Filled 2016-11-28: qty 2

## 2016-11-28 MED ORDER — FENTANYL CITRATE (PF) 100 MCG/2ML IJ SOLN
INTRAMUSCULAR | Status: DC | PRN
  Administered 2016-11-28: 50 ug via INTRAVENOUS
  Administered 2016-11-28: 100 ug via INTRAVENOUS
  Administered 2016-11-28: 50 ug via INTRAVENOUS

## 2016-11-28 MED ORDER — LIDOCAINE 2% (20 MG/ML) 5 ML SYRINGE
INTRAMUSCULAR | Status: AC
  Filled 2016-11-28: qty 5

## 2016-11-28 MED ORDER — FENTANYL CITRATE (PF) 100 MCG/2ML IJ SOLN
INTRAMUSCULAR | Status: AC
  Administered 2016-11-28: 50 ug via INTRAVENOUS
  Filled 2016-11-28: qty 2

## 2016-11-28 MED ORDER — SODIUM CHLORIDE 0.9% FLUSH
3.0000 mL | Freq: Two times a day (BID) | INTRAVENOUS | Status: DC
  Administered 2016-11-30 – 2016-12-01 (×3): 3 mL via INTRAVENOUS

## 2016-11-28 MED ORDER — GLIPIZIDE 5 MG PO TABS
5.0000 mg | ORAL_TABLET | Freq: Two times a day (BID) | ORAL | Status: DC
  Administered 2016-11-28 – 2016-12-01 (×7): 5 mg via ORAL
  Filled 2016-11-28 (×7): qty 1

## 2016-11-28 MED ORDER — SODIUM CHLORIDE 0.9 % IV SOLN
INTRAVENOUS | Status: DC
Start: 2016-11-28 — End: 2016-11-30
  Administered 2016-11-28 – 2016-11-29 (×3): via INTRAVENOUS

## 2016-11-28 MED ORDER — HYDROMORPHONE HCL 2 MG/ML IJ SOLN
0.5000 mg | INTRAMUSCULAR | Status: DC | PRN
  Administered 2016-11-28: 0.5 mg via INTRAVENOUS
  Filled 2016-11-28: qty 1

## 2016-11-28 MED ORDER — INSULIN ASPART 100 UNIT/ML ~~LOC~~ SOLN: 0.0000 [IU] | Freq: Three times a day (TID) | SUBCUTANEOUS | Status: DC

## 2016-11-28 MED ORDER — ONDANSETRON HCL 4 MG/2ML IJ SOLN
INTRAMUSCULAR | Status: DC | PRN
  Administered 2016-11-28: 4 mg via INTRAVENOUS

## 2016-11-28 MED ORDER — ONDANSETRON HCL 4 MG/2ML IJ SOLN
4.0000 mg | Freq: Four times a day (QID) | INTRAMUSCULAR | Status: DC | PRN
  Administered 2016-11-29: 4 mg via INTRAVENOUS
  Filled 2016-11-28 (×2): qty 2

## 2016-11-28 MED ORDER — ARTIFICIAL TEARS OP OINT
TOPICAL_OINTMENT | OPHTHALMIC | Status: DC | PRN
  Administered 2016-11-28: 1 via OPHTHALMIC

## 2016-11-28 MED ORDER — ARTIFICIAL TEARS OP OINT
TOPICAL_OINTMENT | OPHTHALMIC | Status: AC
  Filled 2016-11-28: qty 3.5

## 2016-11-28 MED ORDER — ONDANSETRON HCL 4 MG/2ML IJ SOLN
INTRAMUSCULAR | Status: AC
  Filled 2016-11-28: qty 2

## 2016-11-28 MED ORDER — GABAPENTIN 300 MG PO CAPS
900.0000 mg | ORAL_CAPSULE | Freq: Three times a day (TID) | ORAL | Status: DC
  Administered 2016-11-28 – 2016-12-01 (×10): 900 mg via ORAL
  Filled 2016-11-28 (×10): qty 3

## 2016-11-28 MED ORDER — ONDANSETRON HCL 4 MG PO TABS: 4.0000 mg | ORAL_TABLET | Freq: Four times a day (QID) | ORAL | Status: DC | PRN

## 2016-11-28 MED ORDER — SODIUM CHLORIDE 0.9% FLUSH: 3.0000 mL | INTRAVENOUS | Status: DC | PRN

## 2016-11-28 MED ORDER — PHENYLEPHRINE 40 MCG/ML (10ML) SYRINGE FOR IV PUSH (FOR BLOOD PRESSURE SUPPORT)
PREFILLED_SYRINGE | INTRAVENOUS | Status: AC
  Filled 2016-11-28: qty 10

## 2016-11-28 MED ORDER — OXYCODONE HCL 5 MG PO TABS
ORAL_TABLET | ORAL | Status: AC
  Filled 2016-11-28: qty 2

## 2016-11-28 MED ORDER — DOCUSATE SODIUM 100 MG PO CAPS
100.0000 mg | ORAL_CAPSULE | Freq: Two times a day (BID) | ORAL | Status: DC
  Administered 2016-11-28 – 2016-12-01 (×7): 100 mg via ORAL
  Filled 2016-11-28 (×7): qty 1

## 2016-11-28 MED ORDER — MEPERIDINE HCL 25 MG/ML IJ SOLN: 6.2500 mg | INTRAMUSCULAR | Status: DC | PRN

## 2016-11-28 MED ORDER — SERTRALINE HCL 50 MG PO TABS
50.0000 mg | ORAL_TABLET | Freq: Every day | ORAL | Status: DC
  Administered 2016-11-28 – 2016-11-30 (×3): 50 mg via ORAL
  Filled 2016-11-28 (×3): qty 1

## 2016-11-28 MED ORDER — METFORMIN HCL 500 MG PO TABS
1000.0000 mg | ORAL_TABLET | Freq: Two times a day (BID) | ORAL | Status: DC
  Administered 2016-11-28 – 2016-12-01 (×7): 1000 mg via ORAL
  Filled 2016-11-28 (×7): qty 2

## 2016-11-28 MED ORDER — ACETAMINOPHEN 650 MG RE SUPP: 650.0000 mg | RECTAL | Status: DC | PRN

## 2016-11-28 MED ORDER — MENTHOL 3 MG MT LOZG: 1.0000 | LOZENGE | OROMUCOSAL | Status: DC | PRN

## 2016-11-28 MED ORDER — CHLORHEXIDINE GLUCONATE 4 % EX LIQD: 60.0000 mL | Freq: Once | CUTANEOUS | Status: DC

## 2016-11-28 MED ORDER — ACETAMINOPHEN 325 MG PO TABS
ORAL_TABLET | ORAL | Status: AC
  Administered 2016-11-28: 650 mg via ORAL
  Filled 2016-11-28: qty 2

## 2016-11-28 MED ORDER — MIDAZOLAM HCL 5 MG/5ML IJ SOLN
INTRAMUSCULAR | Status: DC | PRN
  Administered 2016-11-28: 2 mg via INTRAVENOUS

## 2016-11-28 MED ORDER — ROCURONIUM BROMIDE 100 MG/10ML IV SOLN
INTRAVENOUS | Status: DC | PRN
  Administered 2016-11-28: 50 mg via INTRAVENOUS

## 2016-11-28 MED ORDER — SUGAMMADEX SODIUM 200 MG/2ML IV SOLN
INTRAVENOUS | Status: DC | PRN
  Administered 2016-11-28: 200 mg via INTRAVENOUS

## 2016-11-28 MED ORDER — PROPOFOL 10 MG/ML IV BOLUS
INTRAVENOUS | Status: AC
  Filled 2016-11-28: qty 40

## 2016-11-28 MED ORDER — FENTANYL CITRATE (PF) 100 MCG/2ML IJ SOLN
INTRAMUSCULAR | Status: AC
  Filled 2016-11-28: qty 4

## 2016-11-28 MED ORDER — PHENOL 1.4 % MT LIQD
1.0000 | OROMUCOSAL | Status: DC | PRN
Start: 2016-11-28 — End: 2016-12-01

## 2016-11-28 MED ORDER — ACETAMINOPHEN 325 MG PO TABS
650.0000 mg | ORAL_TABLET | ORAL | Status: DC | PRN
  Administered 2016-11-28 (×2): 650 mg via ORAL
  Filled 2016-11-28: qty 2

## 2016-11-28 MED ORDER — BISOPROLOL-HYDROCHLOROTHIAZIDE 5-6.25 MG PO TABS
1.0000 | ORAL_TABLET | Freq: Two times a day (BID) | ORAL | Status: DC
  Administered 2016-11-28 – 2016-12-01 (×6): 1 via ORAL
  Filled 2016-11-28 (×7): qty 1

## 2016-11-28 MED ORDER — SUCCINYLCHOLINE CHLORIDE 200 MG/10ML IV SOSY
PREFILLED_SYRINGE | INTRAVENOUS | Status: AC
  Filled 2016-11-28: qty 10

## 2016-11-28 MED ORDER — SODIUM CHLORIDE 0.9 % IV SOLN
250.0000 mL | INTRAVENOUS | Status: DC
  Administered 2016-11-28: 250 mL via INTRAVENOUS

## 2016-11-28 MED ORDER — LISINOPRIL 20 MG PO TABS
20.0000 mg | ORAL_TABLET | Freq: Every day | ORAL | Status: DC
  Administered 2016-11-28 – 2016-12-01 (×4): 20 mg via ORAL
  Filled 2016-11-28 (×4): qty 1

## 2016-11-28 MED ORDER — 0.9 % SODIUM CHLORIDE (POUR BTL) OPTIME
TOPICAL | Status: DC | PRN
  Administered 2016-11-28: 1000 mL

## 2016-11-28 MED ORDER — MIDAZOLAM HCL 2 MG/2ML IJ SOLN
INTRAMUSCULAR | Status: AC
  Filled 2016-11-28: qty 2

## 2016-11-28 MED ORDER — LIDOCAINE HCL (CARDIAC) 20 MG/ML IV SOLN
INTRAVENOUS | Status: DC | PRN
  Administered 2016-11-28: 80 mg via INTRAVENOUS

## 2016-11-28 MED ORDER — PHENYLEPHRINE HCL 10 MG/ML IJ SOLN
INTRAVENOUS | Status: DC | PRN
  Administered 2016-11-28: 50 ug/min via INTRAVENOUS

## 2016-11-28 MED ORDER — PHENYLEPHRINE HCL 10 MG/ML IJ SOLN
INTRAMUSCULAR | Status: DC | PRN
  Administered 2016-11-28 (×2): 120 ug via INTRAVENOUS
  Administered 2016-11-28: 160 ug via INTRAVENOUS

## 2016-11-28 MED ORDER — BUPIVACAINE HCL (PF) 0.25 % IJ SOLN
INTRAMUSCULAR | Status: AC
  Filled 2016-11-28: qty 30

## 2016-11-28 MED ORDER — LACTATED RINGERS IV SOLN
INTRAVENOUS | Status: DC | PRN
  Administered 2016-11-28 (×2): via INTRAVENOUS

## 2016-11-28 MED ORDER — METHOCARBAMOL 500 MG PO TABS
500.0000 mg | ORAL_TABLET | Freq: Four times a day (QID) | ORAL | Status: DC | PRN
  Administered 2016-11-28 – 2016-12-01 (×6): 500 mg via ORAL
  Filled 2016-11-28 (×7): qty 1

## 2016-11-28 MED ORDER — PROPOFOL 10 MG/ML IV BOLUS
INTRAVENOUS | Status: DC | PRN
  Administered 2016-11-28: 150 mg via INTRAVENOUS
  Administered 2016-11-28: 50 mg via INTRAVENOUS

## 2016-11-28 MED ORDER — BUPIVACAINE HCL (PF) 0.25 % IJ SOLN
INTRAMUSCULAR | Status: DC | PRN
  Administered 2016-11-28: 10 mL

## 2016-11-28 MED ORDER — OXYCODONE HCL 5 MG PO TABS
5.0000 mg | ORAL_TABLET | ORAL | Status: DC | PRN
  Administered 2016-11-28 – 2016-12-01 (×14): 10 mg via ORAL
  Filled 2016-11-28 (×13): qty 2

## 2016-11-28 MED ORDER — ROCURONIUM BROMIDE 50 MG/5ML IV SOSY
PREFILLED_SYRINGE | INTRAVENOUS | Status: AC
  Filled 2016-11-28: qty 5

## 2016-11-28 MED ORDER — ONDANSETRON HCL 4 MG/2ML IJ SOLN: 4.0000 mg | Freq: Once | INTRAMUSCULAR | Status: DC | PRN

## 2016-11-28 MED ORDER — NORTRIPTYLINE HCL 10 MG PO CAPS
40.0000 mg | ORAL_CAPSULE | Freq: Every day | ORAL | Status: DC
  Administered 2016-11-28 – 2016-11-30 (×3): 40 mg via ORAL
  Filled 2016-11-28 (×4): qty 4

## 2016-11-28 MED ORDER — FENTANYL CITRATE (PF) 100 MCG/2ML IJ SOLN
25.0000 ug | INTRAMUSCULAR | Status: DC | PRN
  Administered 2016-11-28: 50 ug via INTRAVENOUS

## 2016-11-28 SURGICAL SUPPLY — 46 items
ADH SKN CLS APL DERMABOND .7 (GAUZE/BANDAGES/DRESSINGS)
BUR ROUND FLUTED 4 SOFT TCH (BURR) IMPLANT
BUR ROUND FLUTED 4MM SOFT TCH (BURR)
CANISTER SUCT 3000ML PPV (MISCELLANEOUS) ×3 IMPLANT
CLOSURE STERI-STRIP 1/2X4 (GAUZE/BANDAGES/DRESSINGS) ×1
CLSR STERI-STRIP ANTIMIC 1/2X4 (GAUZE/BANDAGES/DRESSINGS) ×2 IMPLANT
COVER SURGICAL LIGHT HANDLE (MISCELLANEOUS) ×3 IMPLANT
DECANTER SPIKE VIAL GLASS SM (MISCELLANEOUS) ×1 IMPLANT
DERMABOND ADVANCED (GAUZE/BANDAGES/DRESSINGS)
DERMABOND ADVANCED .7 DNX12 (GAUZE/BANDAGES/DRESSINGS) ×1 IMPLANT
DRAPE MICROSCOPE LEICA (MISCELLANEOUS) ×3 IMPLANT
DRAPE PROXIMA HALF (DRAPES) ×6 IMPLANT
DRAPE SURG 17X23 STRL (DRAPES) ×3 IMPLANT
DRSG MEPILEX BORDER 4X4 (GAUZE/BANDAGES/DRESSINGS) ×3 IMPLANT
DURAPREP 26ML APPLICATOR (WOUND CARE) ×3 IMPLANT
ELECT REM PT RETURN 9FT ADLT (ELECTROSURGICAL) ×3
ELECTRODE REM PT RTRN 9FT ADLT (ELECTROSURGICAL) ×1 IMPLANT
GLOVE BIOGEL PI IND STRL 8 (GLOVE) ×2 IMPLANT
GLOVE BIOGEL PI INDICATOR 8 (GLOVE) ×4
GLOVE ORTHO TXT STRL SZ7.5 (GLOVE) ×6 IMPLANT
GOWN STRL REUS W/ TWL LRG LVL3 (GOWN DISPOSABLE) ×2 IMPLANT
GOWN STRL REUS W/ TWL XL LVL3 (GOWN DISPOSABLE) ×1 IMPLANT
GOWN STRL REUS W/TWL 2XL LVL3 (GOWN DISPOSABLE) ×3 IMPLANT
GOWN STRL REUS W/TWL LRG LVL3 (GOWN DISPOSABLE) ×6
GOWN STRL REUS W/TWL XL LVL3 (GOWN DISPOSABLE) ×3
KIT BASIN OR (CUSTOM PROCEDURE TRAY) ×3 IMPLANT
KIT ROOM TURNOVER OR (KITS) ×3 IMPLANT
MANIFOLD NEPTUNE II (INSTRUMENTS) ×3 IMPLANT
NDL HYPO 25GX1X1/2 BEV (NEEDLE) ×1 IMPLANT
NDL SPNL 18GX3.5 QUINCKE PK (NEEDLE) ×1 IMPLANT
NEEDLE HYPO 25GX1X1/2 BEV (NEEDLE) ×3 IMPLANT
NEEDLE SPNL 18GX3.5 QUINCKE PK (NEEDLE) ×3 IMPLANT
NS IRRIG 1000ML POUR BTL (IV SOLUTION) ×3 IMPLANT
PACK LAMINECTOMY ORTHO (CUSTOM PROCEDURE TRAY) ×3 IMPLANT
PAD ARMBOARD 7.5X6 YLW CONV (MISCELLANEOUS) ×6 IMPLANT
PATTIES SURGICAL .5 X.5 (GAUZE/BANDAGES/DRESSINGS) IMPLANT
PATTIES SURGICAL .75X.75 (GAUZE/BANDAGES/DRESSINGS) IMPLANT
SUT VIC AB 0 CT1 27 (SUTURE)
SUT VIC AB 0 CT1 27XBRD ANBCTR (SUTURE) IMPLANT
SUT VIC AB 1 CT1 27 (SUTURE) ×3
SUT VIC AB 1 CT1 27XBRD ANBCTR (SUTURE) ×1 IMPLANT
SUT VIC AB 2-0 CT1 27 (SUTURE) ×3
SUT VIC AB 2-0 CT1 TAPERPNT 27 (SUTURE) ×1 IMPLANT
SUT VIC AB 3-0 X1 27 (SUTURE) ×3 IMPLANT
TOWEL OR 17X24 6PK STRL BLUE (TOWEL DISPOSABLE) ×1 IMPLANT
TOWEL OR 17X26 10 PK STRL BLUE (TOWEL DISPOSABLE) ×1 IMPLANT

## 2016-11-28 NOTE — H&P (Signed)
Melanie [name] is an 71 y.o. female.   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   Past Medical History:  Diagnosis Date  . Arthritis   . Bradycardia   . Depression    under control  . DM (diabetes mellitus) ([place])    type 2  . Dyspnea    with some exertion  . Family history of anesthesia complication    mother-nausea and vomiting  . GERD (gastroesophageal reflux disease)   . Headache   . HTN (hypertension)   . Neuropathy (HCC)    legs and feet  . PAF (paroxysmal atrial fibrillation) (HCC) 10 years ago   no problems since, due to stress  . Situational stress    "was taking care of sick husband"    Past Surgical History:  Procedure Laterality Date  . ABDOMINAL HYSTERECTOMY  1980   partial  . APPENDECTOMY  1980  . BACK SURGERY  2004  . CARDIAC CATHETERIZATION     in the late 90's/early 2000's  . CARPAL TUNNEL RELEASE Bilateral 2003, 2004  . CHOLECYSTECTOMY N/A 12/28/2014   Procedure: LAPAROSCOPIC CHOLECYSTECTOMY WITH INTRAOPERATIVE CHOLANGIOGRAM;  Surgeon: Armandina Gemma, MD;  Location: [facility];  Service: General;  Laterality: N/A;  . COLONOSCOPY  8 years ago  . JOINT REPLACEMENT Bilateral 2007  . KNEE ARTHROSCOPY Bilateral 1990's  . LUMBAR LAMINECTOMY N/A 07/20/2013   Procedure: MICRODISCECTOMY LUMBAR LAMINECTOMY;  Surgeon: [name], MD;  Location: [facility];  Service: Orthopedics;  Laterality: N/A;  L2-3 Decompression    Family History  Problem Relation Age of Onset  . Heart attack Father     Deceased, 22  . Other Mother     Deceased, 68  . Neuropathy Mother   . Hypertension Mother   . Ovarian cancer Sister     Deceased, 77  . Neuropathy Sister   . Multiple sclerosis Sister   . Stroke Sister   . Hypertension Sister    Social History:  reports that she has been smoking Cigarettes.  She has a 15.00 pack-year smoking history. She has  never used smokeless tobacco. She reports that she does not drink alcohol or use drugs.  Allergies:  Allergies  Allergen Reactions  . Fenofibrate Other (See Comments)    "muscle spasms"  . Metoprolol Other (See Comments)    Headache    Medications Prior to Admission  Medication Sig Dispense Refill  . aspirin EC 81 MG tablet Take 81 mg by mouth daily.     . bisoprolol-hydrochlorothiazide (ZIAC) 5-6.25 MG tablet TAKE ONE TABLET BY MOUTH TWICE DAILY 180 tablet 3  . calcium carbonate (TUMS - DOSED IN MG ELEMENTAL CALCIUM) 500 MG chewable tablet Chew 1 tablet by mouth 2 (two) times daily as needed for indigestion or heartburn.    . gabapentin (NEURONTIN) 300 MG capsule Take 3 capsules (900 mg total) by mouth 3 (three) times daily. 270 capsule 11  . glipiZIDE (GLUCOTROL) 5 MG tablet Take 5 mg by mouth 2 (two) times daily before a meal.     . ibuprofen (ADVIL,MOTRIN) 200 MG tablet Take 800 mg by mouth 2 (two) times daily as needed for moderate pain.    Marland Kitchen lisinopril (PRINIVIL,ZESTRIL) 20 MG tablet TAKE ONE TABLET BY MOUTH ONCE DAILY 90 tablet 3  . metFORMIN (GLUCOPHAGE) 1000 MG tablet Take 1,000 mg by mouth 2 (two) times daily.    Marland Kitchen  nortriptyline (PAMELOR) 10 MG capsule Take 4 capsules (40 mg total) by mouth at bedtime. (Patient taking differently: Take 40 mg by mouth at bedtime. Takes 4 tablets) 120 capsule 5  . sertraline (ZOLOFT) 50 MG tablet Take 50 mg by mouth at bedtime.     Marland Kitchen HYDROcodone-acetaminophen (NORCO/VICODIN) 5-325 MG tablet Take 1 tablet by mouth every 8 (eight) hours as needed for moderate pain. (Patient not taking: Reported on 11/25/2016) 30 tablet 0    Results for orders placed or performed during the hospital encounter of 11/28/16 (from the past 48 hour(s))  Glucose, capillary     Status: Abnormal   Collection Time: 11/28/16  5:50 AM  Result Value Ref Range   Glucose-Capillary 114 (H) 65 - 99 mg/dL   Comment 1 Notify RN    Comment 2 Document in Chart    No results  found.  Review of Systems  Constitutional: Negative.   HENT: Negative.   Respiratory: Negative.   Cardiovascular: Negative.   Gastrointestinal: Negative.   Genitourinary: Negative.   Musculoskeletal: Positive for back pain.  Skin: Negative.   Neurological: Positive for tingling.  Psychiatric/Behavioral: Negative.     Blood pressure (!) 147/78, pulse 71, temperature 97.8 F (36.6 C), temperature source Oral, resp. rate 18, weight 197 lb (89.4 kg), SpO2 99 %. Physical Exam  Constitutional: She is oriented to person, place, and time. No distress.  HENT:  Head: Normocephalic and atraumatic.  Eyes: EOM are normal. Pupils are equal, round, and reactive to light.  Neck: Normal range of motion.  Respiratory: No respiratory distress.  Musculoskeletal: She exhibits tenderness.  Gait antalgic.  Positive SLR.    Neurological: She is alert and oriented to person, place, and time.  Skin: Skin is warm and dry.  Psychiatric: She has a normal mood and affect.     Assessment/Plan Left L3-4 HNP, low back pain and left LE radiculopathy  Will proceed with Left L3-4 Microdiscectomy, Left L3 Hemilaminectomy as scheduled.  Surgical procedure along with possible risks and complications discussed.  All questions answered.   Benjiman Core, PA-C 11/28/2016, 7:01 AM

## 2016-11-28 NOTE — Progress Notes (Signed)
Melanie [name] remains intermittently restless, yet sleepy. Respond s approrpitaely when called by name/follows commands and  Is neuro intact.

## 2016-11-28 NOTE — Telephone Encounter (Signed)
Phone call with Dr. Lorin Mercy and anesthesiology  Melanie [name] had a low risk myoview on Feb. 1. 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  She needs to have back surgery / decompression   She is at low risk of CV complications during or after surgery . She has not had any angina or cardiac complications. We are available to see her following surgery if needed.     [name], MD  11/28/2016 9:57 AM    [name],  [address] [place], [place]  [address] Pager [phone] Phone: [phone]; Fax: [phone]

## 2016-11-28 NOTE — Brief Op Note (Signed)
11/28/2016  12:45 PM  PATIENT:  Melanie [name]  71 y.o. 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 Ricard Dillon   ANESTHESIA:   general  EBL:  Total I/O In: 1000 [I.V.:1000] Out: 50 [Blood:50]  BLOOD ADMINISTERED:none  DRAINS: none   LOCAL MEDICATIONS USED:  MARCAINE     SPECIMEN:  No Specimen  DISPOSITION OF SPECIMEN:  N/A  COUNTS:  YES  TOURNIQUET:  * No tourniquets in log *  DICTATION: .Dragon Dictation  PLAN OF CARE: Admit for overnight observation  PATIENT DISPOSITION:  PACU - hemodynamically stable.   }

## 2016-11-28 NOTE — Op Note (Signed)
Preop diagnosis: L3-4 moderate spinal stenosis with left L3-4 HNP with cephalad migration.  Postop diagnosis: Same  Procedure: Complete L3 laminectomy with central decompression for spinal stenosis. Microscope assisted microdiscectomy left L3-4, removal of migrated cephalad HNP fragment.  Surgeon: Rodell Perna M.D.  Assistant: Benjiman Core PA-C medically necessary and present for the entire procedure  Anesthesia: Gen.  Complications: None  Findings: L3-4 multifactorial spinal stenosis below previous decompressed L2-3 level. Cephalad migrated fragment on the left that had migrated cephalad up to the L3 pedicle with radiculopathy and neurogenic claudication from spinal stenosis.  Procedure: After preoperative Ancef prophylaxis intubation transferred to the prone position careful padding and positioning yellow foam pads underneath the shoulders and ulnar nerve. Pumpers were used in a warming blanket. Back was prepped with DuraPrep there squared with towels Betadine Steri-Drape applied and laminectomy sheet. Spinal needle was used for localization needle was placed at the midportion of the pedicle at L3 and just below the L3-4 disc space based on palpable landmarks and the old L2-3 incision. Crosstable lateral x-ray confirmed we were at the appropriate level and the needles were exactly at the area for plan decompression. Upper needle was at the cephalad position of the migrated fragment. Cross patches had been made on the skin prior to application of the Steri-Drape for aid in closure. Midline incision was made subperiosteal dissection of the lamina. Patient had a very short distance between the right and left facet and inadequate space for just a left L3 laminectomy in order to reach the fragment. Complete L3 laminectomy was performed thinning the lamina with a 4 mm bur and then draping operative microscope and using microscope assisted visualization for removal of the thick chunks of ligament. There was  scar tissue present from the previous surgery at L23 and thick Giemsa ligament were freed up from the dura and removed decompressing the moderate spinal stenosis at L3-4. Bone was removed at the level the pedicle on both sides and partial facetectomy was performed. Lateral recess was decompressed and using the [place]o on the left side disc was visualized. Bipolar cautery was used for some veins in the lateral gutter on the left side and patties were used to protect the dura. Fragments were teased down with the ball-tip nerve hook and hockey-stick grasped with a micropituitary using operative microscope. Overhanging spurs removed off the level of the pedicle incomplete removal of the disc fragment was performed. Annulus was incised and passes were made into the disc removing some additional fragments. Anterior to the dura to 34 space there is no areas of compression no midline bulging. Right and left gutters were checked to make sure that there is no remaining chunks of ligamentum left no overhanging spurs in the DuraPrep tube was around decompressed and no remaining fragment on the left side. Copious irrigation then standard closure #1 Vicryl 2-0 Vicryl subtendinous tissue skin closure postoperative dressing and transferred recovery room

## 2016-11-28 NOTE — Anesthesia Procedure Notes (Signed)
Procedure Name: Intubation Date/Time: 11/28/2016 10:48 AM Performed by: Scheryl Darter Pre-anesthesia Checklist: Patient identified, Emergency Drugs available, Suction available and Patient being monitored Patient Re-evaluated:Patient Re-evaluated prior to inductionOxygen Delivery Method: Circle System Utilized Preoxygenation: Pre-oxygenation with 100% oxygen Intubation Type: IV induction Ventilation: Mask ventilation without difficulty Laryngoscope Size: Miller and 2 Grade View: Grade III Tube type: Oral Number of attempts: 1 Airway Equipment and Method: Stylet Placement Confirmation: ETT inserted through vocal cords under direct vision,  positive ETCO2 and breath sounds checked- equal and bilateral Tube secured with: Tape Dental Injury: Teeth and Oropharynx as per pre-operative assessment  Difficulty Due To: Difficulty was anticipated, Difficult Airway- due to reduced neck mobility and Difficult Airway- due to anterior larynx

## 2016-11-28 NOTE — Interval H&P Note (Signed)
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 After consideration of risks, benefits and other options for treatment, the patient has consented to  Procedure(s): Left L3-4 Microdiscectomy, Left L3 Hemilaminectomy (N/A) as a surgical intervention .  The patient's history has been reviewed, patient examined, no change in status, stable for surgery.  I have reviewed the patient's chart and labs.  Questions were answered to the patient's satisfaction.     [name]

## 2016-11-28 NOTE — Transfer of Care (Signed)
Immediate Anesthesia Transfer of Care Note  Patient: Melanie [name]  Procedure(s) Performed: Procedure(s): L3 LAMINECTOMY, PARTIAL LAMINECTOMY WITH REMOVAL OF FREE FRAGMENT LEFT L3-4 (N/A)  Patient Location: PACU  Anesthesia Type:General  Level of Consciousness: awake, alert , oriented and sedated  Airway & Oxygen Therapy: Patient Spontanous Breathing and Patient connected to nasal cannula oxygen  Post-op Assessment: Report given to RN, Post -op Vital signs reviewed and stable and Patient moving all extremities  Post vital signs: Reviewed and stable  Last Vitals:  Vitals:   11/28/16 0548  BP: (!) 147/78  Pulse: 71  Resp: 18  Temp: 36.6 C    Last Pain:  Vitals:   11/28/16 0556  TempSrc:   PainSc: 9       Patients Stated Pain Goal: 3 (11/27/92 5859)  Complications: No apparent anesthesia complications

## 2016-11-28 NOTE — Progress Notes (Signed)
Patient ID: Melanie [name], female   DOB: [birth year], 71 y.o.   MRN: [id number] Post op check. Leg pain better. Has been OOB to bathroom. If mobile can go home tomorrow.

## 2016-11-29 DIAGNOSIS — Z79899 Other long term (current) drug therapy: Secondary | ICD-10-CM | POA: Diagnosis not present

## 2016-11-29 DIAGNOSIS — M5116 Intervertebral disc disorders with radiculopathy, lumbar region: Secondary | ICD-10-CM | POA: Diagnosis present

## 2016-11-29 DIAGNOSIS — Z888 Allergy status to other drugs, medicaments and biological substances status: Secondary | ICD-10-CM | POA: Diagnosis not present

## 2016-11-29 DIAGNOSIS — M549 Dorsalgia, unspecified: Secondary | ICD-10-CM | POA: Diagnosis not present

## 2016-11-29 DIAGNOSIS — Z8249 Family history of ischemic heart disease and other diseases of the circulatory system: Secondary | ICD-10-CM | POA: Diagnosis not present

## 2016-11-29 DIAGNOSIS — M1990 Unspecified osteoarthritis, unspecified site: Secondary | ICD-10-CM | POA: Diagnosis not present

## 2016-11-29 DIAGNOSIS — E871 Hypo-osmolality and hyponatremia: Secondary | ICD-10-CM | POA: Diagnosis not present

## 2016-11-29 DIAGNOSIS — E119 Type 2 diabetes mellitus without complications: Secondary | ICD-10-CM | POA: Diagnosis not present

## 2016-11-29 DIAGNOSIS — R0600 Dyspnea, unspecified: Secondary | ICD-10-CM | POA: Diagnosis not present

## 2016-11-29 DIAGNOSIS — Z7982 Long term (current) use of aspirin: Secondary | ICD-10-CM | POA: Diagnosis not present

## 2016-11-29 DIAGNOSIS — M545 Low back pain: Secondary | ICD-10-CM | POA: Diagnosis not present

## 2016-11-29 DIAGNOSIS — M48061 Spinal stenosis, lumbar region without neurogenic claudication: Secondary | ICD-10-CM | POA: Diagnosis present

## 2016-11-29 DIAGNOSIS — E1142 Type 2 diabetes mellitus with diabetic polyneuropathy: Secondary | ICD-10-CM | POA: Diagnosis present

## 2016-11-29 DIAGNOSIS — Z4889 Encounter for other specified surgical aftercare: Secondary | ICD-10-CM | POA: Diagnosis not present

## 2016-11-29 DIAGNOSIS — Z7984 Long term (current) use of oral hypoglycemic drugs: Secondary | ICD-10-CM | POA: Diagnosis not present

## 2016-11-29 DIAGNOSIS — R001 Bradycardia, unspecified: Secondary | ICD-10-CM | POA: Diagnosis not present

## 2016-11-29 DIAGNOSIS — F329 Major depressive disorder, single episode, unspecified: Secondary | ICD-10-CM | POA: Diagnosis present

## 2016-11-29 DIAGNOSIS — F17210 Nicotine dependence, cigarettes, uncomplicated: Secondary | ICD-10-CM | POA: Diagnosis present

## 2016-11-29 DIAGNOSIS — I480 Paroxysmal atrial fibrillation: Secondary | ICD-10-CM | POA: Diagnosis present

## 2016-11-29 DIAGNOSIS — M48062 Spinal stenosis, lumbar region with neurogenic claudication: Secondary | ICD-10-CM | POA: Diagnosis not present

## 2016-11-29 DIAGNOSIS — M5000 Cervical disc disorder with myelopathy, unspecified cervical region: Secondary | ICD-10-CM | POA: Diagnosis not present

## 2016-11-29 DIAGNOSIS — K219 Gastro-esophageal reflux disease without esophagitis: Secondary | ICD-10-CM | POA: Diagnosis present

## 2016-11-29 DIAGNOSIS — I10 Essential (primary) hypertension: Secondary | ICD-10-CM | POA: Diagnosis present

## 2016-11-29 DIAGNOSIS — Z90710 Acquired absence of both cervix and uterus: Secondary | ICD-10-CM | POA: Diagnosis not present

## 2016-11-29 LAB — CBC
HCT: 35.6 % — ABNORMAL LOW (ref 36.0–46.0)
Hemoglobin: 11.3 g/dL — ABNORMAL LOW (ref 12.0–15.0)
MCH: 31.5 pg (ref 26.0–34.0)
MCHC: 31.7 g/dL (ref 30.0–36.0)
MCV: 99.2 fL (ref 78.0–100.0)
Platelets: 203 10*3/uL (ref 150–400)
RBC: 3.59 MIL/uL — ABNORMAL LOW (ref 3.87–5.11)
RDW: 14.3 % (ref 11.5–15.5)
WBC: 8.2 10*3/uL (ref 4.0–10.5)

## 2016-11-29 LAB — BASIC METABOLIC PANEL
Anion gap: 5 (ref 5–15)
BUN: 15 mg/dL (ref 6–20)
CO2: 28 mmol/L (ref 22–32)
Calcium: 8.5 mg/dL — ABNORMAL LOW (ref 8.9–10.3)
Chloride: 95 mmol/L — ABNORMAL LOW (ref 101–111)
Creatinine, Ser: 1.15 mg/dL — ABNORMAL HIGH (ref 0.44–1.00)
GFR calc Af Amer: 55 mL/min — ABNORMAL LOW (ref >60)
GFR calc non Af Amer: 48 mL/min — ABNORMAL LOW (ref >60)
Glucose, Bld: 92 mg/dL (ref 65–99)
Potassium: 4.6 mmol/L (ref 3.5–5.1)
Sodium: 128 mmol/L — ABNORMAL LOW (ref 135–145)

## 2016-11-29 LAB — GLUCOSE, CAPILLARY
Glucose-Capillary: 92 mg/dL (ref 65–99)
Glucose-Capillary: 46 mg/dL — ABNORMAL LOW (ref 65–99)
Glucose-Capillary: 70 mg/dL (ref 65–99)
Glucose-Capillary: 146 mg/dL — ABNORMAL HIGH (ref 65–99)
Glucose-Capillary: 71 mg/dL (ref 65–99)

## 2016-11-29 MED ORDER — HYDROCODONE-ACETAMINOPHEN 5-325 MG PO TABS: 1.0000 | ORAL_TABLET | ORAL | 0 refills | Status: DC | PRN

## 2016-11-29 MED ORDER — METHOCARBAMOL 500 MG PO TABS: 500.0000 mg | ORAL_TABLET | Freq: Four times a day (QID) | ORAL | 1 refills | Status: DC | PRN

## 2016-11-29 NOTE — Progress Notes (Signed)
Patient had a critical blood sugar of 46. Orange juice, graham crackers and peanut butter given. Manuela Schwartz, NT asked to recheck in 30 minutes.

## 2016-11-29 NOTE — Care Management Note (Signed)
71 yo F s/p L3 laminectomy, partial laminectomy with removal of free fragment L L3-4  Received referral to assist with [facility] needs and DME.  PT is recommending HHPT. She has a walker  Met with pt at bedside. She plans to return home with the support of her husband. She agreed with HHPT. Asked pt if she has a preference for a [facility] [place] agency. Pt reports that she used [facility] in the past and she prefers to use them again. Contacted Dona at [facility]) and she accepted the referral.

## 2016-11-29 NOTE — Evaluation (Signed)
Physical Therapy Evaluation Patient Details Name: Melanie [name] MRN: [id number] DOB: 11/26/1945 Today's Date: 11/29/2016   History of Present Illness  Pt is a 71 yo female admitted on 11/28/16 for a L3 laminectomy with central decompression and L3-L4 microdiscectomy. PMH significant for OA, Depression, DM2, dyspnea, GERD, HTN, A-fib.   Clinical Impression  Pt is POD 1 following the above procedure. Prior to admission, pt was completely independent including assisting her husband who has been ill for the past 20 years. Pt was able to mobilize without an AD prior to surgery. Pt presents initially without any c/o pain and is able to transfer to EOB and mobilize to bathroom. Attempted performing gait in hallway and pt has increased c/o pain at 8-10/10 as distance increases with c/o mm cramping in RLE which is consistent with her pain prior to surgery. Pt assisted back to recliner. RN notified of change in pain status and that pt may not be ready to DC home this AM. Pt will benefit from continued acute PT services in order to address below deficits and train on stair negotiation prior to discharge home.     Follow Up Recommendations Home health PT;Supervision/Assistance - 24 hour    Equipment Recommendations  None recommended by PT    Recommendations for Other Services OT consult     Precautions / Restrictions Precautions Precautions: Back;Fall Precaution Booklet Issued: Yes (comment) Precaution Comments: Handout given and reviewed in full Restrictions Weight Bearing Restrictions: No      Mobility  Bed Mobility Overal bed mobility: Needs Assistance Bed Mobility: Rolling;Sidelying to Sit Rolling: Min guard Sidelying to sit: Min guard       General bed mobility comments: Cues for sequencing and proper technique in order to reduce strain on back with mobility  Transfers Overall transfer level: Needs assistance Equipment used: Rolling walker (2 wheeled) Transfers: Sit to/from  Stand Sit to Stand: Min guard         General transfer comment: Min guard for EOB and from Commode. Pt has LOB x1 posteriorly and was able to self-recover without sitting back onto commode  Ambulation/Gait Ambulation/Gait assistance: Min guard Ambulation Distance (Feet): 50 Feet Assistive device: Rolling walker (2 wheeled) Gait Pattern/deviations: Step-through pattern;Decreased step length - right;Decreased step length - left Gait velocity: decreased Gait velocity interpretation: Below normal speed for age/gender General Gait Details: Mild antalgic gait increases to Moderate antalgic gait as distance increased. pt has increased c/o cramping in RLE and demonstrates decreased step length.   Stairs            Wheelchair Mobility    Modified Rankin (Stroke Patients Only)       Balance Overall balance assessment: Needs assistance Sitting-balance support: No upper extremity supported;Feet supported Sitting balance-Leahy Scale: Good     Standing balance support: Bilateral upper extremity supported;No upper extremity supported;During functional activity Standing balance-Leahy Scale: Fair Standing balance comment: Able to stand at sink to wash hands without UE support                             Pertinent Vitals/Pain Pain Assessment: 0-10 Pain Score: 8  Pain Location: Right anterior thigh after short distance gait Pain Descriptors / Indicators: Cramping Pain Intervention(s): Monitored during session;Premedicated before session;Repositioned;Limited activity within patient's tolerance    Home Living Family/patient expects to be discharged to:: Private residence Living Arrangements: Spouse/significant other Available Help at Discharge: Family;Available 24 hours/day Type of Home: House Home Access: Stairs  to enter Entrance Stairs-Rails: Right;Left Entrance Stairs-Number of Steps: 8 Home Layout: One level Home Equipment: [place] - 2 wheels;Bedside commode;Shower  seat;Cane - single point;Hand held shower head      Prior Function Level of Independence: Independent         Comments: completely independent and driving prior to surgery     Hand Dominance   Dominant Hand: Right    Extremity/Trunk Assessment   Upper Extremity Assessment Upper Extremity Assessment: Overall WFL for tasks assessed    Lower Extremity Assessment Lower Extremity Assessment: Overall WFL for tasks assessed    Cervical / Trunk Assessment Cervical / Trunk Assessment: Other exceptions Cervical / Trunk Exceptions: Spine surgery  Communication   Communication: No difficulties  Cognition Arousal/Alertness: Awake/alert Behavior During Therapy: WFL for tasks assessed/performed Overall Cognitive Status: Within Functional Limits for tasks assessed                      General Comments      Exercises     Assessment/Plan    PT Assessment Patient needs continued PT services  PT Problem List Decreased strength;Decreased activity tolerance;Decreased balance;Decreased mobility;Decreased knowledge of use of DME;Pain       PT Treatment Interventions DME instruction;Gait training;Stair training;Functional mobility training;Therapeutic activities;Therapeutic exercise;Balance training;Patient/family education    PT Goals (Current goals can be found in the Care Plan section)  Acute Rehab PT Goals Patient Stated Goal: to get home and have less pain PT Goal Formulation: With patient Time For Goal Achievement: 12/06/16 Potential to Achieve Goals: Good    Frequency Min 5X/week   Barriers to discharge        Co-evaluation               End of Session Equipment Utilized During Treatment: Gait belt Activity Tolerance: Patient limited by pain Patient left: in chair;with call bell/phone within reach Nurse Communication: Mobility status;Patient requests pain meds;Other (comment) (increased pain with mobility) PT Visit Diagnosis: Muscle weakness  (generalized) (M62.81);Difficulty in walking, not elsewhere classified (R26.2);Pain Pain - Right/Left: Right Pain - part of body: Leg    Functional Assessment Tool Used: AM-PAC 6 Clicks Basic Mobility;Clinical judgement Functional Limitation: Mobility: Walking and moving around Mobility: Walking and Moving Around Current Status (Q3300): At least 40 percent but less than 60 percent impaired, limited or restricted Mobility: Walking and Moving Around Goal Status [phone]): At least 1 percent but less than 20 percent impaired, limited or restricted    Time: 0935-1004 PT Time Calculation (min) (ACUTE ONLY): 29 min   Charges:   PT Evaluation $PT Eval Moderate Complexity: 1 Procedure PT Treatments $Gait Training: 8-22 mins   PT G Codes:   PT G-Codes **NOT FOR INPATIENT CLASS** Functional Assessment Tool Used: AM-PAC 6 Clicks Basic Mobility;Clinical judgement Functional Limitation: Mobility: Walking and moving around Mobility: Walking and Moving Around Current Status (J3354): At least 40 percent but less than 60 percent impaired, limited or restricted Mobility: Walking and Moving Around Goal Status [phone]): At least 1 percent but less than 20 percent impaired, limited or restricted     Scheryl Marten PT, DPT  [phone]  11/29/2016, 12:18 PM

## 2016-11-29 NOTE — Progress Notes (Signed)
Patient ID: Melanie [name], female   DOB: 01/03/1946, 71 y.o.   MRN: [id number] PT order placed, and therapy noted that she is not safe for discharge. Care management request [facility] for PT and face to face. This will be done.

## 2016-11-29 NOTE — Discharge Instructions (Signed)
° ° °  No lifting greater than 10 lbs. °Avoid bending, stooping and twisting. °Walk in house for first week them may start to get out slowly increasing distance up to one quarter mile by 3 weeks post op. °Keep incision dry for 3 days, may use tegaderm or similar water impervious dressing. ° °

## 2016-11-29 NOTE — Progress Notes (Addendum)
     Subjective: 1 Day Post-Op Procedure(s) (LRB): L3 LAMINECTOMY, PARTIAL LAMINECTOMY WITH REMOVAL OF FREE FRAGMENT LEFT L3-4 (N/A) Awake, alert and oriented x 4, reading newspaper and book. Up to bathroom and voiding normally, no PT as yet. Patient reports pain as moderate.    Objective:   VITALS:  Temp:  [97.5 F (36.4 C)-98.1 F (36.7 C)] 97.6 F (36.4 C) (03/17 0700) Pulse Rate:  [62-88] 67 (03/17 0700) Resp:  [9-19] 12 (03/16 1426) BP: (107-155)/(52-117) 113/52 (03/17 0700) SpO2:  [91 %-100 %] 100 % (03/17 0700)  Neurologically intact ABD soft Neurovascular intact Sensation intact distally Intact pulses distally Incision: no drainage   LABS  Recent Labs  11/27/16 1342 11/29/16 0438  HGB 13.2 11.3*  WBC 8.3 8.2  PLT 236 203    Recent Labs  11/27/16 1342 11/29/16 0438  NA 132* 128*  K 4.1 4.6  CL 95* 95*  CO2 27 28  BUN 13 15  CREATININE 1.66* 1.15*  GLUCOSE 88 92   No results for input(s): LABPT, INR in the last 72 hours.   Assessment/Plan: 1 Day Post-Op Procedure(s) (LRB): L3 LAMINECTOMY, PARTIAL LAMINECTOMY WITH REMOVAL OF FREE FRAGMENT LEFT L3-4 (N/A)  Advance diet Up with therapy Discharge home with home health after being seen by PT today.  [name] 11/29/2016, 8:49 AM Patient ID: Melanie [name], female   DOB: March 01, 1946, 71 y.o.   MRN: [id number]

## 2016-11-30 LAB — GLUCOSE, CAPILLARY
Glucose-Capillary: 115 mg/dL — ABNORMAL HIGH (ref 65–99)
Glucose-Capillary: 114 mg/dL — ABNORMAL HIGH (ref 65–99)
Glucose-Capillary: 100 mg/dL — ABNORMAL HIGH (ref 65–99)
Glucose-Capillary: 89 mg/dL (ref 65–99)
Glucose-Capillary: 68 mg/dL (ref 65–99)
Glucose-Capillary: 114 mg/dL — ABNORMAL HIGH (ref 65–99)

## 2016-11-30 LAB — CBC WITH DIFFERENTIAL/PLATELET
Basophils Absolute: 0 10*3/uL (ref 0.0–0.1)
Basophils Relative: 0 %
Eosinophils Absolute: 0.2 10*3/uL (ref 0.0–0.7)
Eosinophils Relative: 2 %
HCT: 34 % — ABNORMAL LOW (ref 36.0–46.0)
Hemoglobin: 11 g/dL — ABNORMAL LOW (ref 12.0–15.0)
Lymphocytes Relative: 31 %
Lymphs Abs: 2.5 10*3/uL (ref 0.7–4.0)
MCH: 32 pg (ref 26.0–34.0)
MCHC: 32.4 g/dL (ref 30.0–36.0)
MCV: 98.8 fL (ref 78.0–100.0)
Monocytes Absolute: 0.7 10*3/uL (ref 0.1–1.0)
Monocytes Relative: 8 %
Neutro Abs: 4.5 10*3/uL (ref 1.7–7.7)
Neutrophils Relative %: 59 %
Platelets: 182 10*3/uL (ref 150–400)
RBC: 3.44 MIL/uL — ABNORMAL LOW (ref 3.87–5.11)
RDW: 14.3 % (ref 11.5–15.5)
WBC: 7.8 10*3/uL (ref 4.0–10.5)

## 2016-11-30 LAB — BASIC METABOLIC PANEL
Anion gap: 9 (ref 5–15)
BUN: 15 mg/dL (ref 6–20)
CO2: 29 mmol/L (ref 22–32)
Calcium: 8.9 mg/dL (ref 8.9–10.3)
Chloride: 91 mmol/L — ABNORMAL LOW (ref 101–111)
Creatinine, Ser: 1.05 mg/dL — ABNORMAL HIGH (ref 0.44–1.00)
GFR calc Af Amer: >60 mL/min (ref >60)
GFR calc non Af Amer: 53 mL/min — ABNORMAL LOW (ref >60)
Glucose, Bld: 84 mg/dL (ref 65–99)
Potassium: 4.7 mmol/L (ref 3.5–5.1)
Sodium: 129 mmol/L — ABNORMAL LOW (ref 135–145)

## 2016-11-30 NOTE — Progress Notes (Signed)
Hgb is stable 11 Na stable 129, IVF discontinued expect this will continue to improve and normalize.

## 2016-11-30 NOTE — Evaluation (Signed)
Occupational Therapy Evaluation Patient Details Name: Melanie [name] MRN: [id number] DOB: [birth year] Today's Date: 11/30/2016    History of Present Illness Pt is a 71 yo female admitted on 11/28/16 for a L3 laminectomy with central decompression and L3-L4 microdiscectomy. PMH significant for OA, Depression, DM2, dyspnea, GERD, HTN, A-fib.    Clinical Impression   Pt. Was educated on use of AE and DME to adhere to back precautions. Pt. Is not able to state back precautons and is not following. Pt. Has decreased I and safety with ADLs and mobiltiy Pt. Would be appropriate for [facility] SNF stay to increase I and safety with ADLs and mobility. Pt. Does want to go home but knows that unless her mobility increases that she will need to go to snf.    Follow Up Recommendations  SNF    Equipment Recommendations  None recommended by OT    Recommendations for Other Services       Precautions / Restrictions Precautions Precautions: Back;Fall Precaution Comments: Handout given and reviewed in full Required Braces or Orthoses:  (no brace) Restrictions Weight Bearing Restrictions: No      Mobility Bed Mobility Overal bed mobility: Needs Assistance Bed Mobility: Rolling;Sidelying to Sit;Sit to Sidelying Rolling: Min guard Sidelying to sit: Min assist     Sit to sidelying: Min assist General bed mobility comments: Cues for sequencing and proper technique in order to reduce strain on back with mobility; assist for LE's when returning to sidelying  Transfers Overall transfer level: Needs assistance Equipment used: Rolling walker (2 wheeled) Transfers: Sit to/from Stand Sit to Stand: Min assist              Balance Overall balance assessment: Needs assistance Sitting-balance support: No upper extremity supported;Feet supported Sitting balance-Leahy Scale: Good     Standing balance support: Bilateral upper extremity supported;During functional activity Standing balance-Leahy Scale:  Poor Standing balance comment: reliant on RW                            ADL Overall ADL's : Needs assistance/impaired Eating/Feeding: Independent   Grooming: Wash/dry hands;Wash/dry face;Supervision/safety;Sitting   Upper Body Bathing: Supervision/ safety;Set up;With adaptive equipment;Cueing for safety;Sitting   Lower Body Bathing: Minimal assistance;Moderate assistance;With adaptive equipment;Cueing for safety;Cueing for back precautions   Upper Body Dressing : Minimal assistance;Cueing for safety;Sitting   Lower Body Dressing: Moderate assistance;With adaptive equipment;Cueing for back precautions               Functional mobility during ADLs:  (Pt. pain limited pt. willingness for OOB activity. ) General ADL Comments:  (Pt. educated on AE and DME for ADLs and adherring to back )     Vision Baseline Vision/History: Wears glasses Wears Glasses: Reading only Patient Visual Report: No change from baseline       Perception     Praxis      Pertinent Vitals/Pain Pain Assessment: 0-10 Pain Score: 6  Pain Location:  (lower back to buttocks and l leg) Pain Descriptors / Indicators: Crushing Pain Intervention(s): Limited activity within patient's tolerance;Premedicated before session     Hand Dominance     Extremity/Trunk Assessment Upper Extremity Assessment Upper Extremity Assessment: Overall WFL for tasks assessed           Communication Communication Communication: No difficulties   Cognition Arousal/Alertness: Awake/alert Behavior During Therapy: WFL for tasks assessed/performed Overall Cognitive Status: Within Functional Limits for tasks assessed  General Comments       Exercises       Shoulder Instructions      Home Living Family/patient expects to be discharged to:: Skilled nursing facility Living Arrangements: Spouse/significant other Available Help at Discharge: Family               Bathroom  Shower/Tub: Tub/shower unit         Home Equipment: Environmental consultant - 2 wheels;Cane - single point;Bedside commode;Shower seat;Hand held shower head   Additional Comments: Pt. wants to go home but is now thinking of short term rehab secondary to pain is limiting mobility.       Prior Functioning/Environment Level of Independence: Independent with assistive device(s)        Comments:  (Pt. was doing the limited cooking and cleanng. )        OT Problem List: Decreased activity tolerance;Decreased knowledge of use of DME or AE;Decreased knowledge of precautions;Pain      OT Treatment/Interventions: Self-care/ADL training;DME and/or AE instruction    OT Goals(Current goals can be found in the care plan section) Acute Rehab OT Goals Patient Stated Goal: Pt. wants to go home. OT Goal Formulation: With patient Time For Goal Achievement: 12/14/16 Potential to Achieve Goals: Good ADL Goals Pt Will Perform Grooming: with modified independence;standing Pt Will Perform Upper Body Bathing: with modified independence;sitting Pt Will Perform Lower Body Bathing: with modified independence;with adaptive equipment;sit to/from stand Pt Will Perform Upper Body Dressing: with modified independence;with adaptive equipment;sitting;standing Pt Will Perform Lower Body Dressing: with modified independence;sit to/from stand;with adaptive equipment Pt Will Transfer to Toilet: with modified independence;ambulating;bedside commode Pt Will Perform Toileting - Clothing Manipulation and hygiene: with modified independence;sit to/from stand;with adaptive equipment  OT Frequency: Min 2X/week   Barriers to D/C: Decreased caregiver support          Co-evaluation              End of Session    Activity Tolerance: Patient limited by pain Patient left: in bed;with call bell/phone within reach;with bed alarm set;with family/visitor present  OT Visit Diagnosis: Unsteadiness on feet (R26.81);Pain Pain - part  of body:  (back)                ADL either performed or assessed with clinical judgement  Time: 1133-1226 OT Time Calculation (min): 53 min Charges:  OT General Charges $OT Visit: 1 Procedure OT Evaluation $OT Eval Moderate Complexity: 1 Procedure OT Treatments $Self Care/Home Management : 23-37 mins G-Codes:     Clinical judgement, click score 15.   Kenyetta Wimbish 11/30/2016, 12:29 PM

## 2016-11-30 NOTE — Progress Notes (Signed)
Physical Therapy Treatment Patient Details Name: Melanie [name] MRN: [id number] DOB: 1946-02-04 Today's Date: 11/30/2016    History of Present Illness Pt is a 71 yo female admitted on 11/28/16 for a L3 laminectomy with central decompression and L3-L4 microdiscectomy. PMH significant for OA, Depression, DM2, dyspnea, GERD, HTN, A-fib.     PT Comments    Patient continues to be limited by pain affecting mobility.  Patient with difficulty moving and requires assistance for transfers and gait.  Reviewed back precautions with patient and family.  Feel patient will benefit from continued PT to progress mobility and increase independence.    Follow Up Recommendations  Home health PT;Supervision - Intermittent     Equipment Recommendations  None recommended by PT    Recommendations for Other Services OT consult     Precautions / Restrictions Precautions Precautions: Back;Fall Precaution Comments: Handout given and reviewed in full Required Braces or Orthoses:  (no brace)    Mobility  Bed Mobility Overal bed mobility: Needs Assistance Bed Mobility: Rolling;Sidelying to Sit;Sit to Sidelying Rolling: Min guard Sidelying to sit: Min assist     Sit to sidelying: Min assist General bed mobility comments: Cues for sequencing and proper technique in order to reduce strain on back with mobility; assist for LE's when returning to sidelying  Transfers Overall transfer level: Needs assistance Equipment used: Rolling walker (2 wheeled) Transfers: Sit to/from Stand Sit to Stand: Min assist            Ambulation/Gait Ambulation/Gait assistance: Min guard Ambulation Distance (Feet): 60 Feet Assistive device: Rolling walker (2 wheeled) Gait Pattern/deviations: Step-to pattern;Decreased weight shift to left;Antalgic;Decreased stance time - left Gait velocity: decreased   General Gait Details: Mild antalgic gait increases to Moderate antalgic gait as distance increased. pt has  increased c/o cramping in RLE and demonstrates decreased step length.    Stairs            Wheelchair Mobility    Modified Rankin (Stroke Patients Only)       Balance Overall balance assessment: Needs assistance Sitting-balance support: No upper extremity supported;Feet supported Sitting balance-Leahy Scale: Good     Standing balance support: Bilateral upper extremity supported;During functional activity Standing balance-Leahy Scale: Poor Standing balance comment: reliant on RW                    Cognition Arousal/Alertness: Awake/alert Behavior During Therapy: WFL for tasks assessed/performed Overall Cognitive Status: Within Functional Limits for tasks assessed                      Exercises General Exercises - Lower Extremity Ankle Circles/Pumps: AROM;Both;10 reps;Supine Short Arc Quad: AROM;Both;10 reps;Supine    General Comments        Pertinent Vitals/Pain Pain Assessment: 0-10 Pain Score: 8  Pain Location: Right anterior thigh after short distance gait Pain Descriptors / Indicators: Cramping Pain Intervention(s): Limited activity within patient's tolerance;Monitored during session;Patient requesting pain meds-RN notified;Repositioned    Home Living                      Prior Function            PT Goals (current goals can now be found in the care plan section) Progress towards PT goals: Progressing toward goals    Frequency    Min 5X/week      PT Plan Current plan remains appropriate    Co-evaluation  End of Session Equipment Utilized During Treatment: Gait belt Activity Tolerance: Patient limited by pain Patient left: in bed;with call bell/phone within reach;with family/visitor present Nurse Communication: Mobility status;Patient requests pain meds;Other (comment) PT Visit Diagnosis: Other abnormalities of gait and mobility (R26.89);Muscle weakness (generalized) (M62.81)     Time:  2751-7001 PT Time Calculation (min) (ACUTE ONLY): 23 min  Charges:  $Gait Training: 23-37 mins                    G Codes:       Shanna Cisco 11/30/2016, 11:25 AM  11/30/2016 [place], [place]

## 2016-11-30 NOTE — Progress Notes (Signed)
Physical Therapy Treatment Patient Details Name: AMI MALLY MRN: [id number] DOB: Dec 23, 1945 Today's Date: 11/30/2016    History of Present Illness Pt is a 71 yo female admitted on 11/28/16 for a L3 laminectomy with central decompression and L3-L4 microdiscectomy. PMH significant for OA, Depression, DM2, dyspnea, GERD, HTN, A-fib.     PT Comments    Pt performed increased activity but appears to be lethargic due to her pain medicine.  Pt intermittently falling asleep during rest breaks.  Pt unaware of her deficits and at times closing her eyes.  Pt will need to perform better tomorrow if she remains to return home. Will continue efforts at this time per POC.    Follow Up Recommendations  Home health PT;Supervision - Intermittent     Equipment Recommendations  None recommended by PT    Recommendations for Other Services OT consult     Precautions / Restrictions Precautions Precautions: Back;Fall Precaution Booklet Issued: Yes (comment) Precaution Comments: Handout given and reviewed in full Required Braces or Orthoses:  (no brace) Restrictions Weight Bearing Restrictions: No    Mobility  Bed Mobility Overal bed mobility: Needs Assistance Bed Mobility: Rolling;Sidelying to Sit;Sit to Sidelying Rolling: Min guard Sidelying to sit: Min assist     Sit to sidelying: Mod assist General bed mobility comments: Cues for sequencing and proper technique in order to reduce strain on back with mobility; assist for LE's when returning to sidelying  Transfers Overall transfer level: Needs assistance Equipment used: Rolling walker (2 wheeled) Transfers: Sit to/from Stand Sit to Stand: Min assist         General transfer comment: Pt required cues for hand placement to and from seated surface.  Cues for pushing through B LEs and cues to move into erect stance.    Ambulation/Gait Ambulation/Gait assistance: Min assist Ambulation Distance (Feet): 80 Feet Assistive device:  Rolling walker (2 wheeled) Gait Pattern/deviations: Step-to pattern;Antalgic;Decreased stride length;Shuffle Gait velocity: decreased Gait velocity interpretation: Below normal speed for age/gender General Gait Details: Pt with poor safety awareness with RW and required cues for increasing stride and improving upper trunk control.     Stairs            Wheelchair Mobility    Modified Rankin (Stroke Patients Only)       Balance Overall balance assessment: Needs assistance Sitting-balance support: No upper extremity supported;Feet supported Sitting balance-Leahy Scale: Good     Standing balance support: Bilateral upper extremity supported;During functional activity Standing balance-Leahy Scale: Poor Standing balance comment: reliant on RW                    Cognition Arousal/Alertness: Lethargic;Suspect due to medications Behavior During Therapy: Flat affect (Pt gorked on pain meds and intermittently falling asleep during treatment.  She requires mulitple cueing and redirection for safety.  ) Overall Cognitive Status: Impaired/Different from baseline Area of Impairment: Attention;Following commands;Safety/judgement;Awareness;Problem solving       Following Commands: Follows multi-step commands with increased time;Follows multi-step commands inconsistently Safety/Judgement: Decreased awareness of safety;Decreased awareness of deficits   Problem Solving: Slow processing;Decreased initiation;Difficulty sequencing;Requires verbal cues;Requires tactile cues General Comments: Pt immediately asleep post tx.      Exercises General Exercises - Lower Extremity Ankle Circles/Pumps: AROM;Both;10 reps;Supine Short Arc Quad: AROM;Both;10 reps;Supine    General Comments        Pertinent Vitals/Pain Pain Assessment: Faces Pain Score: 6  Faces Pain Scale: Hurts little more Pain Location: lower back   Pain Descriptors / Indicators: Discomfort;Guarding;Grimacing (  Pt  reports," What did he do have a party back there.") Pain Intervention(s): Monitored during session;Repositioned    Home Living Family/patient expects to be discharged to:: [place]: Spouse/significant other Available Help at Discharge: Family         Home Equipment: Gilford Rile - 2 wheels;Cane - single point;Bedside commode;Shower seat;Hand held shower head Additional Comments: Pt. wants to go home but is now thinking of short term rehab secondary to pain is limiting mobility.     Prior Function Level of Independence: Independent with assistive device(s)      Comments:  (Pt. was doing the limited cooking and cleanng. )   PT Goals (current goals can now be found in the care plan section) Acute Rehab PT Goals Patient Stated Goal: Pt. wants to go home. Potential to Achieve Goals: Good Progress towards PT goals: Progressing toward goals    Frequency    Min 5X/week      PT Plan Current plan remains appropriate    Co-evaluation             End of Session Equipment Utilized During Treatment: Gait belt Activity Tolerance: Patient limited by pain Patient left: in bed;with call bell/phone within reach;with family/visitor present;with bed alarm set Nurse Communication: Mobility status;Patient requests pain meds;Other (comment) PT Visit Diagnosis: Other abnormalities of gait and mobility (R26.89);Muscle weakness (generalized) (M62.81) Pain - Right/Left: Right Pain - part of body: Leg     Time: 7579-7282 PT Time Calculation (min) (ACUTE ONLY): 30 min  Charges:  $Gait Training: 8-22 mins $Therapeutic Activity: 8-22 mins                    G Codes:       Cristela Blue 12/16/2016, 1:53 PM Governor Rooks, PTA pager [phone]

## 2016-11-30 NOTE — Progress Notes (Addendum)
     Subjective: 2 Days Post-Op Procedure(s) (LRB): L3 LAMINECTOMY, PARTIAL LAMINECTOMY WITH REMOVAL OF FREE FRAGMENT LEFT L3-4 (N/A) In hallway ambulating with PT assistance, weakness left knee extension. No fever or chills. I don't think my husband can take care of me. No stair exercises yet. Needs to be able to walk stairs before discharge.  Patient reports pain as moderate.    Objective:   VITALS:  Temp:  [98 F (36.7 C)-98.9 F (37.2 C)] 98 F (36.7 C) (03/18 0659) Pulse Rate:  [71-87] 71 (03/18 0659) BP: (120-150)/(52-58) 120/52 (03/18 0659) SpO2:  [91 %] 91 % (03/18 0659)  Neurologically intact ABD soft Neurovascular intact Sensation intact distally Intact pulses distally Dorsiflexion/Plantar flexion intact Incision: no drainage   LABS  Recent Labs  11/27/16 1342 11/29/16 0438  HGB 13.2 11.3*  WBC 8.3 8.2  PLT 236 203    Recent Labs  11/27/16 1342 11/29/16 0438  NA 132* 128*  K 4.1 4.6  CL 95* 95*  CO2 27 28  BUN 13 15  CREATININE 1.66* 1.15*  GLUCOSE 88 92   No results for input(s): LABPT, INR in the last 72 hours.   Assessment/Plan: 2 Days Post-Op Procedure(s) (LRB): L3 LAMINECTOMY, PARTIAL LAMINECTOMY WITH REMOVAL OF FREE FRAGMENT LEFT L3-4 (N/A) Hyponatremia, creatinine is 1.15 decreased from 1.66 preop  Advance diet Up with therapy  Have care management assist in post hospital planning,  Short term SNF vs HHN after another day of PT/OT Recheck electrolytes, hyponatremia, likely due to fluids about time of surgery. IVF has been discontinued.  PT notes still slow with ambulation and need for assistance.   [name] 11/30/2016, 11:03 AM

## 2016-12-01 ENCOUNTER — Encounter (HOSPITAL_COMMUNITY): Payer: Self-pay | Admitting: Orthopaedic Surgery

## 2016-12-01 DIAGNOSIS — M549 Dorsalgia, unspecified: Secondary | ICD-10-CM | POA: Diagnosis not present

## 2016-12-01 DIAGNOSIS — M48061 Spinal stenosis, lumbar region without neurogenic claudication: Secondary | ICD-10-CM | POA: Diagnosis not present

## 2016-12-01 DIAGNOSIS — M48062 Spinal stenosis, lumbar region with neurogenic claudication: Secondary | ICD-10-CM | POA: Diagnosis not present

## 2016-12-01 DIAGNOSIS — R001 Bradycardia, unspecified: Secondary | ICD-10-CM | POA: Diagnosis not present

## 2016-12-01 DIAGNOSIS — R0600 Dyspnea, unspecified: Secondary | ICD-10-CM | POA: Diagnosis not present

## 2016-12-01 DIAGNOSIS — R262 Difficulty in walking, not elsewhere classified: Secondary | ICD-10-CM | POA: Diagnosis not present

## 2016-12-01 DIAGNOSIS — E119 Type 2 diabetes mellitus without complications: Secondary | ICD-10-CM | POA: Diagnosis not present

## 2016-12-01 DIAGNOSIS — M1990 Unspecified osteoarthritis, unspecified site: Secondary | ICD-10-CM | POA: Diagnosis not present

## 2016-12-01 DIAGNOSIS — F329 Major depressive disorder, single episode, unspecified: Secondary | ICD-10-CM | POA: Diagnosis not present

## 2016-12-01 DIAGNOSIS — E1151 Type 2 diabetes mellitus with diabetic peripheral angiopathy without gangrene: Secondary | ICD-10-CM | POA: Diagnosis not present

## 2016-12-01 DIAGNOSIS — M5000 Cervical disc disorder with myelopathy, unspecified cervical region: Secondary | ICD-10-CM | POA: Diagnosis not present

## 2016-12-01 DIAGNOSIS — Z4889 Encounter for other specified surgical aftercare: Secondary | ICD-10-CM | POA: Diagnosis not present

## 2016-12-01 LAB — GLUCOSE, CAPILLARY: Glucose-Capillary: 120 mg/dL — ABNORMAL HIGH (ref 65–99)

## 2016-12-01 MED ORDER — BISACODYL 10 MG RE SUPP
10.0000 mg | Freq: Once | RECTAL | Status: AC
  Administered 2016-12-01: 10 mg via RECTAL
  Filled 2016-12-01: qty 1

## 2016-12-01 MED ORDER — METHOCARBAMOL 500 MG PO TABS: 500.0000 mg | ORAL_TABLET | Freq: Four times a day (QID) | ORAL | 1 refills | Status: DC | PRN

## 2016-12-01 MED ORDER — HYDROCODONE-ACETAMINOPHEN 5-325 MG PO TABS: 1.0000 | ORAL_TABLET | ORAL | 0 refills | Status: DC | PRN

## 2016-12-01 MED ORDER — MAGNESIUM CITRATE PO SOLN
0.5000 | Freq: Once | ORAL | Status: AC
  Administered 2016-12-01: 0.5 via ORAL
  Filled 2016-12-01: qty 296

## 2016-12-01 NOTE — Progress Notes (Signed)
Called [facility]. Report given to receiving nurse. Notified transport has not yet arrived to take patient.

## 2016-12-01 NOTE — Discharge Summary (Signed)
Patient ID: Melanie [name] MRN: [id number] DOB/AGE: 1946/01/23 71 y.o.  Admit date: 11/28/2016 Discharge date: 12/01/2016  Admission Diagnoses:  Active Problems:   Spinal stenosis of lumbar region   HNP (herniated nucleus pulposus), lumbar   Discharge Diagnoses:  Active Problems:   Spinal stenosis of lumbar region   HNP (herniated nucleus pulposus), lumbar  status post Procedure(s): L3 LAMINECTOMY, PARTIAL LAMINECTOMY WITH REMOVAL OF FREE FRAGMENT LEFT L3-4  Past Medical History:  Diagnosis Date  . Arthritis   . Bradycardia   . Depression    under control  . DM (diabetes mellitus) ([place])    type 2  . Dyspnea    with some exertion  . Family history of anesthesia complication    mother-nausea and vomiting  . GERD (gastroesophageal reflux disease)   . Headache   . HTN (hypertension)   . Neuropathy (HCC)    legs and feet  . PAF (paroxysmal atrial fibrillation) (HCC) 10 years ago   no problems since, due to stress  . Situational stress    "was taking care of sick husband"    Surgeries: Procedure(s): L3 LAMINECTOMY, PARTIAL LAMINECTOMY WITH REMOVAL OF FREE FRAGMENT LEFT L3-4 on 11/28/2016   Consultants:   Discharged Condition: Improved  Hospital Course: Melanie [name] is an 71 y.o. female who was admitted 11/28/2016 for operative treatment of lumbar HNP/stenosis. Patient failed conservative treatments (please see the history and physical for the specifics) and had severe unremitting pain that affects sleep, daily activities and work/hobbies. After pre-op clearance, the patient was taken to the operating room on 11/28/2016 and underwent  Procedure(s): L3 LAMINECTOMY, PARTIAL LAMINECTOMY WITH REMOVAL OF FREE FRAGMENT LEFT L3-4.    Patient was given perioperative antibiotics: Anti-infectives    Start     Dose/Rate Route Frequency Ordered Stop   11/28/16 0700  ceFAZolin (ANCEF) IVPB 2g/100 mL premix     2 g 200 mL/hr over 30 Minutes Intravenous To ShortStay  Surgical 11/27/16 1058 11/28/16 1053       Patient was given sequential compression devices and early ambulation to prevent DVT.   Patient benefited maximally from hospital stay and there were no complications. At the time of discharge, the patient was urinating/moving their bowels without difficulty, tolerating a regular diet, pain is controlled with oral pain medications and they have been cleared by PT/OT.   Recent vital signs: Patient Vitals for the past 24 hrs:  BP Temp Temp src Pulse Resp SpO2  12/01/16 0620 (!) 141/64 98 F (36.7 C) Oral 71 18 92 %  11/30/16 1900 136/66 98.6 F (37 C) Oral 73 16 95 %     Recent laboratory studies:  Recent Labs  11/29/16 0438 11/30/16 1207  WBC 8.2 7.8  HGB 11.3* 11.0*  HCT 35.6* 34.0*  PLT 203 182  NA 128* 129*  K 4.6 4.7  CL 95* 91*  CO2 28 29  BUN 15 15  CREATININE 1.15* 1.05*  GLUCOSE 92 84  CALCIUM 8.5* 8.9     Discharge Medications:   Allergies as of 12/01/2016      Reactions   Fenofibrate Other (See Comments)   "muscle spasms"   Metoprolol Other (See Comments)   Headache      Medication List    TAKE these medications   aspirin EC 81 MG tablet Take 81 mg by mouth daily.   bisoprolol-hydrochlorothiazide 5-6.25 MG tablet Commonly known as:  ZIAC TAKE ONE TABLET BY MOUTH TWICE DAILY   calcium carbonate 500  MG chewable tablet Commonly known as:  TUMS - dosed in mg elemental calcium Chew 1 tablet by mouth 2 (two) times daily as needed for indigestion or heartburn.   gabapentin 300 MG capsule Commonly known as:  NEURONTIN Take 3 capsules (900 mg total) by mouth 3 (three) times daily.   glipiZIDE 5 MG tablet Commonly known as:  GLUCOTROL Take 5 mg by mouth 2 (two) times daily before a meal.   HYDROcodone-acetaminophen 5-325 MG tablet Commonly known as:  NORCO/VICODIN Take 1-2 tablets by mouth every 4 (four) hours as needed for moderate pain. What changed:  how much to take  when to take this   ibuprofen  200 MG tablet Commonly known as:  ADVIL,MOTRIN Take 800 mg by mouth 2 (two) times daily as needed for moderate pain.   lisinopril 20 MG tablet Commonly known as:  PRINIVIL,ZESTRIL TAKE ONE TABLET BY MOUTH ONCE DAILY   metFORMIN 1000 MG tablet Commonly known as:  GLUCOPHAGE Take 1,000 mg by mouth 2 (two) times daily.   methocarbamol 500 MG tablet Commonly known as:  ROBAXIN Take 1 tablet (500 mg total) by mouth every 6 (six) hours as needed for muscle spasms.   nortriptyline 10 MG capsule Commonly known as:  PAMELOR Take 4 capsules (40 mg total) by mouth at bedtime. What changed:  additional instructions   sertraline 50 MG tablet Commonly known as:  ZOLOFT Take 50 mg by mouth at bedtime.       Diagnostic Studies: Dg Lumbar Spine 2-3 Views  Result Date: 11/28/2016 CLINICAL DATA:  L3-4 microdiscectomy EXAM: LUMBAR SPINE - 2-3 VIEW COMPARISON:  MRI 11/02/2016 FINDINGS: First lateral intraoperative image demonstrates posterior needle is directed at the L3 and L4 vertebral bodies. Second lateral intraoperative image demonstrates posterior surgical instruments directed at the L3 and L4 pedicles. IMPRESSION: Intraoperative localization as above. Electronically Signed   By: Rolm Baptise M.D.   On: 11/28/2016 13:05   Mr Lumbar Spine W/o Contrast  Result Date: 11/02/2016 CLINICAL DATA:  Lower back left hip/upper left leg pain for 3 weeks. EXAM: MRI LUMBAR SPINE WITHOUT CONTRAST TECHNIQUE: Multiplanar, multisequence MR imaging of the lumbar spine was performed. No intravenous contrast was administered. COMPARISON:  03/14/2013 FINDINGS: Segmentation:  Standard. Alignment:  No subluxation or scoliosis Vertebrae:  No fracture, evidence of discitis, or bone lesion. Conus medullaris: Extends to the L1 level and appears normal. Paraspinal and other soft tissues: Asymmetric left renal atrophy which was also seen previously. Disc levels: T12- L1: Mild disc narrowing and bulging. L1-L2: Moderate disc  narrowing with bulging and ventral endplate spurring. No compressive stenosis L2-L3: Moderate disc narrowing with right preferential bulging, stable. hypertrophic facet arthropathy. Improved thecal sac patency after posterior decompression. Inferior foraminal effacement without L2 compression. L3-L4: Mild disc narrowing with circumferential bulging. There is a new left paracentral disc extrusion with superior migration. Although herniation is small there is L3 impingement. Moderate left foraminal narrowing and mild right foraminal narrowing. Hypertrophic facet arthropathy with interspinous fluid which may reflect chronic motion. No listhesis. Spinal stenosis is moderate and progressed. L4-L5: Interbody fusion and posterior decompression. No residual impingement. No signs of arachnoiditis. L5-S1:Facet arthropathy with spurring greater on the right. No herniation or impingement. IMPRESSION: 1. L3-4 left paracentral superiorly migrating disc extrusion with L3 impingement. This is the presumed symptomatic finding. 2. L3-4 facet arthropathy with progressed posterior element hypertrophy since 2014. There is interspinous bursa which may reflect chronic motion. Progressed, now moderate spinal stenosis. Moderate left foraminal narrowing. 3. Good  thecal sac patency at L2-3 after interval posterior decompression. 4. L4-5 decompression and interbody fusion. Electronically Signed   By: Monte Fantasia M.D.   On: 11/02/2016 08:14    Discharge Instructions    Call MD / Call 911    Complete by:  As directed    If you experience chest pain or shortness of breath, CALL 911 and be transported to the hospital emergency room.  If you develope a fever above 101 F, pus (white drainage) or increased drainage or redness at the wound, or calf pain, call your surgeon's office.   Constipation Prevention    Complete by:  As directed    Drink plenty of fluids.  Prune juice may be helpful.  You may use a stool softener, such as Colace  (over the counter) 100 mg twice a day.  Use MiraLax (over the counter) for constipation as needed.   Diet - low sodium heart healthy    Complete by:  As directed    Discharge instructions    Complete by:  As directed    No lifting greater than 10 lbs. Avoid bending, stooping and twisting. Walk in house for first week them may start to get out slowly increasing distance up to one quarter mile by 3 weeks post op. Keep incision dry for 3 days, may use tegaderm or similar water impervious dressing.   Driving restrictions    Complete by:  As directed    No driving for 4 weeks   Increase activity slowly as tolerated    Complete by:  As directed    Lifting restrictions    Complete by:  As directed    No lifting for 8 weeks       Contact information for follow-up providers    [name], MD Follow up in 3 week(s).   Specialty:  Orthopedic Surgery Contact information: [place] [address] [phone]        [name], MD Follow up.   Specialty:  Orthopedic Surgery Contact information: [place] [address] [phone]            Contact information for after-discharge care    Destination    [facility] Follow up.   Specialty:  [place] information: [place] [phone]                  Discharge Plan:  discharge to [facility]  Disposition:     Signed: Benjiman Core for Dr Rodell Perna 12/01/2016, 2:11 PM

## 2016-12-01 NOTE — Progress Notes (Signed)
OT NOTE  Pt with pending transport to SNF today per notes from SW. OT to defer all further OT to sNF level at this time.  Jeri Modena   OTR/L Pager: [phone] Office: [phone] .

## 2016-12-01 NOTE — NC FL2 (Signed)
[place] MEDICAID FL2 LEVEL OF CARE SCREENING TOOL     IDENTIFICATION  Patient Name: Melanie [name] Birthdate: 1945-11-03 Sex: female Admission Date (Current Location): 11/28/2016  [facility] and [place] Number:  Herbalist and Address:  [facility] [place], [place] [address], [place], [place] [address]      Provider Number: [id number]  Attending Physician Name and Address:  [name], MD  Relative Name and Phone Number:       Current Level of Care: Hospital Recommended Level of Care: [place] Prior Approval Number:    Date Approved/Denied:   PASRR Number: [id number]  Discharge Plan: SNF    Current Diagnoses: Patient Active Problem List   Diagnosis Date Noted  . HNP (herniated nucleus pulposus), lumbar   . Cholelithiasis with chronic cholecystitis 12/28/2014  . Cholelithiasis with cholecystitis 12/26/2014  . Unspecified vitamin D deficiency 09/27/2014  . Diabetic neuropathy ([place]) 10/06/2013  . B12 deficiency 09/27/2013  . Spinal stenosis of lumbar region 07/20/2013  . HTN (hypertension)   . Bradycardia   . DM (diabetes mellitus) ([place])   . PAF (paroxysmal atrial fibrillation) (HCC)     Orientation RESPIRATION BLADDER Height & Weight     Self, Time, Situation, Place  O2 (Nasal Cannula, 3L) Continent Weight: 197 lb (89.4 kg) Height:     BEHAVIORAL SYMPTOMS/MOOD NEUROLOGICAL BOWEL NUTRITION STATUS      Continent  (Please see discharge summary)  AMBULATORY STATUS COMMUNICATION OF NEEDS Skin   Limited Assist Verbally Surgical wounds (Closed incision on back, foam dressing)                       Personal Care Assistance Level of Assistance  Bathing, Feeding, Dressing Bathing Assistance: Limited assistance Feeding assistance: Independent Dressing Assistance: Limited assistance     Functional Limitations Info  Sight, Hearing, Speech Sight Info: Adequate Hearing Info: Adequate Speech Info: Adequate     SPECIAL CARE FACTORS FREQUENCY  PT (By licensed PT), OT (By licensed OT)     PT Frequency: 5x OT Frequency: 5x            Contractures Contractures Info: Not present    Additional Factors Info  Code Status, Allergies Code Status Info: Full Code Allergies Info: Fenofibrate, Metoprolol           Current Medications (12/01/2016):  This is the current hospital active medication list Current Facility-Administered Medications  Medication Dose Route Frequency Provider Last Rate Last Dose  . 0.9 %  sodium chloride infusion  250 mL Intravenous Continuous Lanae Crumbly, PA-C 1 mL/hr at 11/28/16 1430 250 mL at 11/28/16 1430  . acetaminophen (TYLENOL) tablet 650 mg  650 mg Oral Q4H PRN Lanae Crumbly, PA-C   650 mg at 11/28/16 2020   Or  . acetaminophen (TYLENOL) suppository 650 mg  650 mg Rectal Q4H PRN Lanae Crumbly, PA-C      . bisoprolol-hydrochlorothiazide [facility]) 5-6.25 MG per tablet 1 tablet  1 tablet Oral BID Lanae Crumbly, PA-C   1 tablet at 12/01/16 1059  . docusate sodium (COLACE) capsule 100 mg  100 mg Oral BID Lanae Crumbly, PA-C   100 mg at 12/01/16 1100  . gabapentin (NEURONTIN) capsule 900 mg  900 mg Oral TID Lanae Crumbly, PA-C   900 mg at 12/01/16 1100  . glipiZIDE (GLUCOTROL) tablet 5 mg  5 mg Oral BID AC Lanae Crumbly, PA-C   5 mg at  12/01/16 0800  . insulin aspart (novoLOG) injection 0-15 Units  0-15 Units Subcutaneous TID WC Lanae Crumbly, PA-C      . lisinopril (PRINIVIL,ZESTRIL) tablet 20 mg  20 mg Oral Daily Lanae Crumbly, PA-C   20 mg at 12/01/16 1059  . menthol-cetylpyridinium (CEPACOL) lozenge 3 mg  1 lozenge Oral PRN Lanae Crumbly, PA-C       Or  . phenol (CHLORASEPTIC) mouth spray 1 spray  1 spray Mouth/Throat PRN Lanae Crumbly, PA-C      . metFORMIN (GLUCOPHAGE) tablet 1,000 mg  1,000 mg Oral BID WC Lanae Crumbly, PA-C   1,000 mg at 12/01/16 1505  . methocarbamol (ROBAXIN) tablet 500 mg  500 mg Oral Q6H PRN Lanae Crumbly, PA-C   500 mg at 12/01/16 1100    Or  . methocarbamol (ROBAXIN) 500 mg in dextrose 5 % 50 mL IVPB  500 mg Intravenous Q6H PRN Lanae Crumbly, PA-C   500 mg at 11/28/16 1343  . nortriptyline (PAMELOR) capsule 40 mg  40 mg Oral QHS Lanae Crumbly, PA-C   40 mg at 11/30/16 2132  . ondansetron (ZOFRAN) tablet 4 mg  4 mg Oral Q6H PRN Lanae Crumbly, PA-C       Or  . ondansetron [facility]) injection 4 mg  4 mg Intravenous Q6H PRN Lanae Crumbly, PA-C   4 mg at 11/29/16 2255  . oxyCODONE (Oxy IR/ROXICODONE) immediate release tablet 5-10 mg  5-10 mg Oral Q4H PRN Lanae Crumbly, PA-C   10 mg at 12/01/16 1100  . sertraline (ZOLOFT) tablet 50 mg  50 mg Oral QHS Lanae Crumbly, PA-C   50 mg at 11/30/16 2200  . sodium chloride flush (NS) 0.9 % injection 3 mL  3 mL Intravenous Q12H Lanae Crumbly, PA-C   3 mL at 12/01/16 1100  . sodium chloride flush (NS) 0.9 % injection 3 mL  3 mL Intravenous PRN Lanae Crumbly, PA-C         Discharge Medications: Please see discharge summary for a list of discharge medications.  Relevant Imaging Results:  Relevant Lab Results:   Additional Information SSN: [id number]  Eileen Stanford, LCSW

## 2016-12-01 NOTE — Clinical Social Work Note (Addendum)
Clinical Social Worker facilitated patient discharge including contacting patient family and facility to confirm patient discharge plans.  Clinical information faxed to facility and family agreeable with plan.  CSW arranged ambulance transport via [facility] (5:00) to [facility] .  RN to call [phone], extension [phone] for report prior to discharge.  Clinical Social Worker will sign off for now as social work intervention is no longer needed. Please consult [place] again if new need arises.  [address] [place]

## 2016-12-01 NOTE — Care Management Note (Signed)
Case Management Note  Patient Details  Name: Melanie [name] MRN: [id number] Date of Birth: March 01, 1946  Subjective/Objective:   Pt medically stable for discharge today; CSW has made arrangements for SNF for rehab due to lack of caregiver support.  PT/OT recommending SNF f/u.                   Action/Plan: Pt discharging to SNF today, per CSW arrangments.    Expected Discharge Date:  12/01/16               Expected Discharge Plan:  Skilled Nursing Facility  In-House Referral:  Clinical Social Work  Discharge planning Services  CM Consult  Post Acute Care Choice:    Choice offered to:  Patient  DME Arranged:    DME Agency:     HH Arranged:    [place] Agency:   Status of Service:  Completed, signed off  If discussed at [employer], dates discussed:    Additional Comments:  Ella Bodo, RN 12/01/2016, 5:06 PM

## 2016-12-01 NOTE — Progress Notes (Addendum)
Physical Therapy Treatment Patient Details Name: Melanie [name] MRN: [id number] DOB: [birth year] Today's Date: 12/01/2016    History of Present Illness Pt is a 71 yo female admitted on 11/28/16 for a L3 laminectomy with central decompression and L3-L4 microdiscectomy. PMH significant for OA, Depression, DM2, dyspnea, GERD, HTN, A-fib.     PT Comments    Pt continues to demonstrate decreased safety awareness during mobility tasks with poor recall of precautions, and demonstrates some lethargy suspect secondary to pain meds. Pt required multiple safety cues throughout mobility tasks. Pt reporting her husband is sick and is unable to assist if needed. Updated d/c recommendations to SNF based on current presentation. Will continue to follow and update d/c recommendations as necessary.    Follow Up Recommendations  SNF;Supervision/Assistance - 24 hour     Equipment Recommendations  None recommended by PT    Recommendations for Other Services       Precautions / Restrictions Precautions Precautions: Back;Fall Precaution Booklet Issued: Yes (comment) Precaution Comments: Pt recalled bending and twisting precautions, however, stated "Lettuce" when asked about lifting precaution. Pt required cues for precaution review.  Restrictions Weight Bearing Restrictions: No    Mobility  Bed Mobility Overal bed mobility: Needs Assistance Bed Mobility: Rolling;Sidelying to Sit;Sit to Sidelying Rolling: Min guard Sidelying to sit: Min assist       General bed mobility comments: Verbal cues for use of log roll technique. Min A for trunk elevation. Pt unable to recall log roll technique. Cues for UE placement.   Transfers Overall transfer level: Needs assistance Equipment used: Rolling walker (2 wheeled) Transfers: Sit to/from Stand Sit to Stand: Min assist         General transfer comment: Verbal cues to power through BLE. Required verbal cues for appropriate UE placement during  transfer.   Ambulation/Gait Ambulation/Gait assistance: Min assist;Min guard Ambulation Distance (Feet): 80 Feet Assistive device: Rolling walker (2 wheeled) Gait Pattern/deviations: Step-to pattern;Antalgic;Decreased stride length;Shuffle Gait velocity: decreased Gait velocity interpretation: Below normal speed for age/gender General Gait Details: Distance limited by pain. Multiple cues for upright posture and appropriate step length. Pt demonstrating poor safety awareness to maintain precautions during gait training. See cognition section for details.    Stairs            Wheelchair Mobility    Modified Rankin (Stroke Patients Only)       Balance Overall balance assessment: Needs assistance Sitting-balance support: No upper extremity supported;Feet supported Sitting balance-Leahy Scale: Good     Standing balance support: Bilateral upper extremity supported;During functional activity Standing balance-Leahy Scale: Poor Standing balance comment: reliant on RW                    Cognition Arousal/Alertness: Lethargic;Suspect due to medications Behavior During Therapy: Flat affect Overall Cognitive Status: Impaired/Different from baseline Area of Impairment: Following commands;Safety/judgement;Awareness;Problem solving       Following Commands: Follows multi-step commands inconsistently;Follows multi-step commands with increased time Safety/Judgement: Decreased awareness of safety;Decreased awareness of deficits Awareness: Emergent Problem Solving: Slow processing;Difficulty sequencing;Requires verbal cues;Requires tactile cues General Comments: Pt extremely unaware of deficits and precautions. Pt twisting during ambulation in hallway. Verbal cues not to twist secondary to back precautions, however, pt stating "you have to be aware of your surroundings." Education regarding importance of maintaining precautions.     Exercises      General Comments General  comments (skin integrity, edema, etc.): Pt demonstrating decreased safety awareness and required multiple safety cues during session. Pt  reporting her husband lives with her and is sick, so cant provide physical support needed. Pt does not have any family available to assist 24/7.       Pertinent Vitals/Pain Pain Assessment: Faces Faces Pain Scale: Hurts even more Pain Location: lower back   Pain Descriptors / Indicators: Discomfort;Guarding;Grimacing Pain Intervention(s): Limited activity within patient's tolerance;Monitored during session;Repositioned    Home Living                      Prior Function            PT Goals (current goals can now be found in the care plan section) Acute Rehab PT Goals Patient Stated Goal: Pt. wants to go home. PT Goal Formulation: With patient Time For Goal Achievement: 12/06/16 Potential to Achieve Goals: Good Progress towards PT goals: Progressing toward goals    Frequency    Min 5X/week      PT Plan Discharge plan needs to be updated    Co-evaluation             End of Session Equipment Utilized During Treatment: Gait belt Activity Tolerance: Patient limited by pain Patient left: in chair;with call bell/phone within reach Nurse Communication: Mobility status PT Visit Diagnosis: Other abnormalities of gait and mobility (R26.89);Muscle weakness (generalized) (M62.81) Pain - Right/Left: Right Pain - part of body: Leg     Time: 7290-2111 PT Time Calculation (min) (ACUTE ONLY): 20 min  Charges:  $Gait Training: 8-22 mins                    G Codes:       Mamie Levers 12/01/2016, 10:08 AM  Nicky Pugh, PT, DPT  Acute Rehabilitation Services  Pager: [phone]

## 2016-12-01 NOTE — Clinical Social Work Placement (Signed)
   CLINICAL SOCIAL WORK PLACEMENT  NOTE  Date:  12/01/2016  Patient Details  Name: Melanie [name] MRN: [id number] Date of Birth: May 16, 1946  Clinical Social Work is seeking post-discharge placement for this patient at the [place] level of care (*CSW will initial, date and re-position this form in  chart as items are completed):      Patient/family provided with [facility] Work Department's list of facilities offering this level of care within the geographic area requested by the patient (or if unable, by the patient's family).  Yes   Patient/family informed of their freedom to choose among providers that offer the needed level of care, that participate in Medicare, Medicaid or managed care program needed by the patient, have an available bed and are willing to accept the patient.      Patient/family informed of [facility]'s ownership interest in [facility] and [facility], as well as of the fact that they are under no obligation to receive care at these facilities.  PASRR submitted to EDS on       PASRR number received on 12/01/16     Existing PASRR number confirmed on       FL2 transmitted to all facilities in geographic area requested by pt/family on 12/01/16     FL2 transmitted to all facilities within larger geographic area on       Patient informed that his/her managed care company has contracts with or will negotiate with certain facilities, including the following:        Yes   Patient/family informed of bed offers received.  Patient chooses bed at [facility], [facility]     Physician recommends and patient chooses bed at      Patient to be transferred to [facility] on 12/01/16.  Patient to be transferred to facility by [facility]     Patient family notified on 12/01/16 of transfer.  Name of family member notified:  Vince     PHYSICIAN Please prepare priority discharge summary, including medications, Please prepare  prescriptions, Please sign FL2     Additional Comment:    _______________________________________________ Eileen Stanford, LCSW 12/01/2016, 2:48 PM

## 2016-12-01 NOTE — Clinical Social Work Note (Signed)
Patients husband, Denyse Amass, is a contact patient wanted added to facesheet [phone].  [address] [place]

## 2016-12-01 NOTE — Progress Notes (Signed)
Hypoglycemic Event  CBG: 68 at 2049  Treatment: 15 GM carbohydrate snack  Symptoms: Shaky  Follow-up CBG: Time:2326 CBG Result:114  Possible Reasons for Event: Inadequate meal intake  Comments/MD notified:noted    Melanie [name]

## 2016-12-01 NOTE — Progress Notes (Signed)
Subjective: Doing ok.  c/o some left leg radicular pain.  Spoke with son on phone and states that his father is disabled.  son lives a couple of hours away.  PT is recommending short SNF placement for rehab. No voiding issues.   Objective: Vital signs in last 24 hours: Temp:  [98 F (36.7 C)-98.6 F (37 C)] 98 F (36.7 C) (03/19 0620) Pulse Rate:  [71-73] 71 (03/19 0620) Resp:  [16-18] 18 (03/19 0620) BP: (136-141)/(64-66) 141/64 (03/19 0620) SpO2:  [92 %-95 %] 92 % (03/19 0620)  Intake/Output from previous day: No intake/output data recorded. Intake/Output this shift: No intake/output data recorded.   Recent Labs  11/29/16 0438 11/30/16 1207  HGB 11.3* 11.0*    Recent Labs  11/29/16 0438 11/30/16 1207  WBC 8.2 7.8  RBC 3.59* 3.44*  HCT 35.6* 34.0*  PLT 203 182    Recent Labs  11/29/16 0438 11/30/16 1207  NA 128* 129*  K 4.6 4.7  CL 95* 91*  CO2 28 29  BUN 15 15  CREATININE 1.15* 1.05*  GLUCOSE 92 84  CALCIUM 8.5* 8.9   No results for input(s): LABPT, INR in the last 72 hours.  Exam:  Pleasant female alert and oriented, NAD.  Wound looks good.  steris intact.  No drainage or signs of infection. bilat calves nontender.  No focal motor deficits.   Assessment/Plan: Advised patient and her son of PT's recommendations.  They will discuss with each other.  Anticipate d/c today vs tomorrow.    Benjiman Core 12/01/2016, 9:22 AM

## 2016-12-01 NOTE — Transfer of Care (Signed)
Immediate Anesthesia Transfer of Care Note  Patient: Melanie [name]  Procedure(s) Performed: Procedure(s): L3 LAMINECTOMY, PARTIAL LAMINECTOMY WITH REMOVAL OF FREE FRAGMENT LEFT L3-4 (N/A)  Patient Location: PACU  Anesthesia Type:General  Level of Consciousness: awake, alert , oriented and sedated  Airway & Oxygen Therapy: Patient Spontanous Breathing and Patient connected to nasal cannula oxygen  Post-op Assessment: Report given to RN, Post -op Vital signs reviewed and stable and Patient moving all extremities  Post vital signs: Reviewed and stable  Last Vitals:  Vitals:   11/30/16 1900 12/01/16 0620  BP: 136/66 (!) 141/64  Pulse: 73 71  Resp: 16 18  Temp: 37 C 36.7 C    Last Pain:  Vitals:   12/01/16 0620  TempSrc: Oral  PainSc:       Patients Stated Pain Goal: 3 (82/51/89 8421)  Complications: No apparent anesthesia complications

## 2016-12-01 NOTE — Progress Notes (Signed)
Inpatient Diabetes Program Recommendations  AACE/ADA: New Consensus Statement on Inpatient Glycemic Control (2015)  Target Ranges:  Prepandial:   less than 140 mg/dL      Peak postprandial:   less than 180 mg/dL (1-2 hours)      Critically ill patients:  140 - 180 mg/dL  Results for CASARA, PERRIER (MRN [id number]) as of 12/01/2016 10:47  Ref. Range 11/30/2016 07:03 11/30/2016 11:22 11/30/2016 17:03 11/30/2016 20:49 11/30/2016 23:26 12/01/2016 06:25  Glucose-Capillary Latest Ref Range: 65 - 99 mg/dL 114 (H) 100 (H) 89 68 114 (H) 120 (H)    Review of Glycemic Control  Diabetes history: DM2 Outpatient Diabetes medications: Glipizide 5 mg BID, Metformin 1000 mg BID Current orders for Inpatient glycemic control: Glipizide 5 mg BID, Metformin 1000 mg BID, Novolog 0-15 units TID with meals  Inpatient Diabetes Program Recommendations: Oral Agents: Noted glucose down to 68 mg/dl at 20:49 last night. May want to consider discontinuing Glipizide while inpatient.  Thanks, Barnie Alderman, RN, MSN, CDE Diabetes Coordinator Inpatient Diabetes Program [phone] (Team Pager from 8am to 5pm)

## 2016-12-01 NOTE — Clinical Social Work Note (Signed)
Clinical Social Work Assessment  Patient Details  Name: Melanie [name] MRN: [id number] Date of Birth: 04/08/1946  Date of referral:  12/01/16               Reason for consult:  Facility Placement                Permission sought to share information with:  Family Supports Permission granted to share information::  Yes, Verbal Permission Granted  Name::     Denyse Amass  Agency::     Relationship::  Husband  Contact Information:  [phone]   Housing/Transportation Living arrangements for the past 2 months:  Single Family Home Source of Information:  Patient Patient Interpreter Needed:  None Criminal Activity/Legal Involvement Pertinent to Current Situation/Hospitalization:  No - Comment as needed Significant Relationships:  Adult Children, Spouse Lives with:  Spouse Do you feel safe going back to the place where you live?  Yes Need for family participation in patient care:  No (Coment)  Care giving concerns:  No family or friends present at bedside during initial assessment. Pt granted CSW verbal permission to contact pt's husband.    Social Worker assessment / plan:  CSW spoke with pt at bedside to complete initial assessment. Pt lives home with spouse. Pt is agreeable to SNF placement at this time. Pt was very sleepy during initial assessment. Pt is agreeable to [facility], [place] given location in relation to her home. CSW will contact facility regarding bed availability.   Employment status:  Retired Forensic scientist:  Medicare PT Recommendations:  [place] / Referral to community resources:  [place]  Patient/Family's Response to care:  Pt verbalized understanding of CSW role and expressed appreciation for support. Pt denies any concern regarding pt care at this time.   Patient/Family's Understanding of and Emotional Response to Diagnosis, Current Treatment, and Prognosis:  Pt understanding and realistic regarding physical  limitations. Pt understands the need for SNF placement at d/c. Pt agreeable to SNF placement at d/c, at this time. Pt's responses emotionally appropriate during conversation with CSW. Pt denies any concern regarding treatment plan at this time. CSW will continue to provide support and facilitate d/c needs.   Emotional Assessment Appearance:  Appears stated age Attitude/Demeanor/Rapport:   (Patient was appropriate. Patient appeared very sleepy.) Affect (typically observed):  Accepting, Appropriate, Calm Orientation:  Oriented to Self, Oriented to Place, Oriented to  Time, Oriented to Situation Alcohol / Substance use:  Not Applicable Psych involvement (Current and /or in the community):  No (Comment)  Discharge Needs  Concerns to be addressed:  No discharge needs identified Readmission within the last 30 days:  No Current discharge risk:  Dependent with Mobility, Lack of support system Barriers to Discharge:  Continued Medical Work up   [employer], LCSW 12/01/2016, 1:53 PM

## 2016-12-02 NOTE — Anesthesia Postprocedure Evaluation (Signed)
Anesthesia Post Note  Patient: Melanie [name]  Procedure(s) Performed: Procedure(s) (LRB): L3 LAMINECTOMY, PARTIAL LAMINECTOMY WITH REMOVAL OF FREE FRAGMENT LEFT L3-4 (N/A)  Patient location during evaluation: PACU Anesthesia Type: General Level of consciousness: awake Pain management: pain level controlled Vital Signs Assessment: post-procedure vital signs reviewed and stable Respiratory status: spontaneous breathing Cardiovascular status: stable Postop Assessment: no signs of nausea or vomiting Anesthetic complications: no       Last Vitals:  Vitals:   11/30/16 1900 12/01/16 0620  BP: 136/66 (!) 141/64  Pulse: 73 71  Resp: 16 18  Temp: 37 C 36.7 C    Last Pain:  Vitals:   12/01/16 1154  TempSrc:   PainSc: 0-No pain                 Jolly Mango

## 2016-12-03 DIAGNOSIS — M48061 Spinal stenosis, lumbar region without neurogenic claudication: Secondary | ICD-10-CM | POA: Diagnosis not present

## 2016-12-03 DIAGNOSIS — R262 Difficulty in walking, not elsewhere classified: Secondary | ICD-10-CM | POA: Diagnosis not present

## 2016-12-03 DIAGNOSIS — E1151 Type 2 diabetes mellitus with diabetic peripheral angiopathy without gangrene: Secondary | ICD-10-CM | POA: Diagnosis not present

## 2016-12-03 DIAGNOSIS — Z4889 Encounter for other specified surgical aftercare: Secondary | ICD-10-CM | POA: Diagnosis not present

## 2016-12-09 ENCOUNTER — Ambulatory Visit (INDEPENDENT_AMBULATORY_CARE_PROVIDER_SITE_OTHER): Payer: Medicare Other | Admitting: Orthopaedic Surgery

## 2016-12-09 ENCOUNTER — Encounter (INDEPENDENT_AMBULATORY_CARE_PROVIDER_SITE_OTHER): Payer: Self-pay | Admitting: Orthopaedic Surgery

## 2016-12-09 VITALS — BP 117/68 | HR 73 | Ht 67.0 in | Wt 192.0 lb

## 2016-12-09 DIAGNOSIS — M5126 Other intervertebral disc displacement, lumbar region: Secondary | ICD-10-CM

## 2016-12-09 NOTE — Progress Notes (Signed)
Post-Op Visit Note   Patient: Melanie [name]           Date of Birth: 08/12/1946           MRN: [id number] Visit Date: 12/09/2016 PCP: Marton Redwood, MD   Assessment & Plan:  Chief Complaint:  Chief Complaint  Patient presents with  . Lower Back - Routine Post Op   Visit Diagnoses: Post L3 hemilaminectomy removal of free fragment on the left. She is using her walker and is getting discharged from the skilled facility on Thursday.  Plan: Recheck 1 month. She'll continue to increase her distance. Prescription for Norco 40 tablets prescribed and given to her for when she is released from the skilled facility.  Follow-Up Instructions: Return in about 1 month (around 01/09/2017).   Orders:  No orders of the defined types were placed in this encounter.  No orders of the defined types were placed in this encounter.  HPI Patient presents for first post op visit. She is status post L3 laminectomy, partial laminectomy with removal of free fragment left L3-4 on 11/28/2016.  She is 11 days post op. She is doing well. She continues to have some pain down her legs and has a "heaviness" across her lower back. Her incision looks good. She is currently at [facility] and they are doing dressing changes after her shower.  She is taking Hydrocodone and Robaxin.  Incision is well-healed. She is moving better. At times she still has some left leg pain she states the back feels like it's heavy and aching but not as severe as it was before surgery. Sometimes she gets good relief of the leg pain but still tends to come and go. Imaging: No results found.  PMFS History: Patient Active Problem List   Diagnosis Date Noted  . HNP (herniated nucleus pulposus), lumbar   . Cholelithiasis with chronic cholecystitis 12/28/2014  . Cholelithiasis with cholecystitis 12/26/2014  . Unspecified vitamin D deficiency 09/27/2014  . Diabetic neuropathy ([place]) 10/06/2013  . B12 deficiency 09/27/2013  . Spinal stenosis of  lumbar region 07/20/2013  . HTN (hypertension)   . Bradycardia   . DM (diabetes mellitus) ([place])   . PAF (paroxysmal atrial fibrillation) ([facility])    Past Medical History:  Diagnosis Date  . Arthritis   . Bradycardia   . Depression    under control  . DM (diabetes mellitus) ([place])    type 2  . Dyspnea    with some exertion  . Family history of anesthesia complication    mother-nausea and vomiting  . GERD (gastroesophageal reflux disease)   . Headache   . HTN (hypertension)   . Neuropathy ([facility])    legs and feet  . PAF (paroxysmal atrial fibrillation) ([facility]) 10 years ago   no problems since, due to stress  . Situational stress    "was taking care of sick husband"    Family History  Problem Relation Age of Onset  . Heart attack Father     Deceased, 68  . Other Mother     Deceased, 74  . Neuropathy Mother   . Hypertension Mother   . Ovarian cancer Sister     Deceased, 67  . Neuropathy Sister   . Multiple sclerosis Sister   . Stroke Sister   . Hypertension Sister     Past Surgical History:  Procedure Laterality Date  . ABDOMINAL HYSTERECTOMY  1980   partial  . APPENDECTOMY  1980  . BACK SURGERY  2004  .  CARDIAC CATHETERIZATION     in the late 90's/early 2000's  . CARPAL TUNNEL RELEASE Bilateral 2003, 2004  . CHOLECYSTECTOMY N/A 12/28/2014   Procedure: LAPAROSCOPIC CHOLECYSTECTOMY WITH INTRAOPERATIVE CHOLANGIOGRAM;  Surgeon: Armandina Gemma, MD;  Location: [facility];  Service: General;  Laterality: N/A;  . COLONOSCOPY  8 years ago  . JOINT REPLACEMENT Bilateral 2007  . KNEE ARTHROSCOPY Bilateral 1990's  . LAMINECTOMY  11/28/2016   L 3   . LUMBAR LAMINECTOMY N/A 07/20/2013   Procedure: MICRODISCECTOMY LUMBAR LAMINECTOMY;  Surgeon: [name], MD;  Location: [facility];  Service: Orthopedics;  Laterality: N/A;  L2-3 Decompression  . LUMBAR LAMINECTOMY/DECOMPRESSION MICRODISCECTOMY N/A 11/28/2016   Procedure: L3 LAMINECTOMY, PARTIAL LAMINECTOMY WITH REMOVAL OF FREE FRAGMENT LEFT  L3-4;  Surgeon: [name], MD;  Location: [facility];  Service: Orthopedics;  Laterality: N/A;   Social History   Occupational History  . Not on file.   Social History Main Topics  . Smoking status: Current Every Day Smoker    Packs/day: 0.50    Years: 30.00    Types: Cigarettes  . Smokeless tobacco: Never Used  . Alcohol use No  . Drug use: No  . Sexual activity: Not Currently

## 2016-12-12 DIAGNOSIS — M48061 Spinal stenosis, lumbar region without neurogenic claudication: Secondary | ICD-10-CM | POA: Diagnosis not present

## 2016-12-12 DIAGNOSIS — Z4781 Encounter for orthopedic aftercare following surgical amputation: Secondary | ICD-10-CM | POA: Diagnosis not present

## 2016-12-12 DIAGNOSIS — M519 Unspecified thoracic, thoracolumbar and lumbosacral intervertebral disc disorder: Secondary | ICD-10-CM | POA: Diagnosis not present

## 2016-12-12 DIAGNOSIS — G8929 Other chronic pain: Secondary | ICD-10-CM | POA: Diagnosis not present

## 2016-12-12 DIAGNOSIS — M1990 Unspecified osteoarthritis, unspecified site: Secondary | ICD-10-CM | POA: Diagnosis not present

## 2016-12-12 DIAGNOSIS — E1140 Type 2 diabetes mellitus with diabetic neuropathy, unspecified: Secondary | ICD-10-CM | POA: Diagnosis not present

## 2016-12-15 DIAGNOSIS — E1140 Type 2 diabetes mellitus with diabetic neuropathy, unspecified: Secondary | ICD-10-CM | POA: Diagnosis not present

## 2016-12-15 DIAGNOSIS — M519 Unspecified thoracic, thoracolumbar and lumbosacral intervertebral disc disorder: Secondary | ICD-10-CM | POA: Diagnosis not present

## 2016-12-15 DIAGNOSIS — Z4781 Encounter for orthopedic aftercare following surgical amputation: Secondary | ICD-10-CM | POA: Diagnosis not present

## 2016-12-15 DIAGNOSIS — M1990 Unspecified osteoarthritis, unspecified site: Secondary | ICD-10-CM | POA: Diagnosis not present

## 2016-12-15 DIAGNOSIS — M48061 Spinal stenosis, lumbar region without neurogenic claudication: Secondary | ICD-10-CM | POA: Diagnosis not present

## 2016-12-15 DIAGNOSIS — G8929 Other chronic pain: Secondary | ICD-10-CM | POA: Diagnosis not present

## 2016-12-16 DIAGNOSIS — Z4781 Encounter for orthopedic aftercare following surgical amputation: Secondary | ICD-10-CM | POA: Diagnosis not present

## 2016-12-16 DIAGNOSIS — M1990 Unspecified osteoarthritis, unspecified site: Secondary | ICD-10-CM | POA: Diagnosis not present

## 2016-12-16 DIAGNOSIS — G8929 Other chronic pain: Secondary | ICD-10-CM | POA: Diagnosis not present

## 2016-12-16 DIAGNOSIS — E1140 Type 2 diabetes mellitus with diabetic neuropathy, unspecified: Secondary | ICD-10-CM | POA: Diagnosis not present

## 2016-12-16 DIAGNOSIS — M48061 Spinal stenosis, lumbar region without neurogenic claudication: Secondary | ICD-10-CM | POA: Diagnosis not present

## 2016-12-16 DIAGNOSIS — M519 Unspecified thoracic, thoracolumbar and lumbosacral intervertebral disc disorder: Secondary | ICD-10-CM | POA: Diagnosis not present

## 2016-12-17 ENCOUNTER — Telehealth (INDEPENDENT_AMBULATORY_CARE_PROVIDER_SITE_OTHER): Payer: Self-pay | Admitting: Radiology

## 2016-12-17 DIAGNOSIS — Z4781 Encounter for orthopedic aftercare following surgical amputation: Secondary | ICD-10-CM | POA: Diagnosis not present

## 2016-12-17 DIAGNOSIS — G8929 Other chronic pain: Secondary | ICD-10-CM | POA: Diagnosis not present

## 2016-12-17 DIAGNOSIS — M1990 Unspecified osteoarthritis, unspecified site: Secondary | ICD-10-CM | POA: Diagnosis not present

## 2016-12-17 DIAGNOSIS — M48061 Spinal stenosis, lumbar region without neurogenic claudication: Secondary | ICD-10-CM | POA: Diagnosis not present

## 2016-12-17 DIAGNOSIS — E1140 Type 2 diabetes mellitus with diabetic neuropathy, unspecified: Secondary | ICD-10-CM | POA: Diagnosis not present

## 2016-12-17 DIAGNOSIS — M519 Unspecified thoracic, thoracolumbar and lumbosacral intervertebral disc disorder: Secondary | ICD-10-CM | POA: Diagnosis not present

## 2016-12-17 MED ORDER — HYDROCODONE-ACETAMINOPHEN 5-325 MG PO TABS: 1.0000 | ORAL_TABLET | ORAL | 0 refills | Status: DC | PRN

## 2016-12-17 NOTE — Telephone Encounter (Signed)
Script at front for son to pick up. Melanie [name] is on patient's HIPPA. Patient aware.

## 2016-12-17 NOTE — Telephone Encounter (Signed)
Please advise.  Patient calls today with extreme pain in her left thigh. She states that her back pain is nothing that she cannot handle but this pain is unbearable. Her home physical therapist was there at the home and states that patient was in tears with the pain earlier. The thigh is not swollen and per the therapist, the incision looks good. She is taking Hydrocodone 5/325 1 po q 4 hours and Flexeril 5mg  1 po q 8 hours. I advised patient per the RX written for her, she could take 1-2 Hydrocodone every 4 hours.  She is going to take 2 at her 4pm dose. I told her that Dr. Lorin Mercy was in surgery, but she wants to wait for him to advise prior to doing anything else. Patient will not be able to come in to [facility] office tomorrow and states it would have to be Friday for her to come and see him if needed. If she begins taking 2 Hydrocodone every 4 hours, she will run out and need refill. She has around 17  Left.

## 2016-12-17 NOTE — Addendum Note (Signed)
Addended by: Meyer Cory on: 12/17/2016 04:20 PM   Modules accepted: Orders

## 2016-12-17 NOTE — Telephone Encounter (Signed)
I called and discussed.

## 2016-12-17 NOTE — Telephone Encounter (Signed)
Per Dr. Lorin Mercy, enter refill for Norco 5/325 #40 and print and he will sign. Patient's son can pick up tomorrow. Done.

## 2016-12-18 ENCOUNTER — Other Ambulatory Visit: Payer: Self-pay | Admitting: Cardiovascular Disease

## 2016-12-24 DIAGNOSIS — Z4781 Encounter for orthopedic aftercare following surgical amputation: Secondary | ICD-10-CM | POA: Diagnosis not present

## 2016-12-24 DIAGNOSIS — M1990 Unspecified osteoarthritis, unspecified site: Secondary | ICD-10-CM | POA: Diagnosis not present

## 2016-12-24 DIAGNOSIS — G8929 Other chronic pain: Secondary | ICD-10-CM | POA: Diagnosis not present

## 2016-12-24 DIAGNOSIS — M519 Unspecified thoracic, thoracolumbar and lumbosacral intervertebral disc disorder: Secondary | ICD-10-CM | POA: Diagnosis not present

## 2016-12-24 DIAGNOSIS — E1140 Type 2 diabetes mellitus with diabetic neuropathy, unspecified: Secondary | ICD-10-CM | POA: Diagnosis not present

## 2016-12-24 DIAGNOSIS — M48061 Spinal stenosis, lumbar region without neurogenic claudication: Secondary | ICD-10-CM | POA: Diagnosis not present

## 2016-12-25 ENCOUNTER — Telehealth (INDEPENDENT_AMBULATORY_CARE_PROVIDER_SITE_OTHER): Payer: Self-pay | Admitting: Orthopaedic Surgery

## 2016-12-25 MED ORDER — HYDROCODONE-ACETAMINOPHEN 5-325 MG PO TABS: 1.0000 | ORAL_TABLET | Freq: Four times a day (QID) | ORAL | 0 refills | Status: DC | PRN

## 2016-12-25 NOTE — Telephone Encounter (Signed)
Patient called needing Rx refilled Hydrocodone. The number to contact patient is [phone] 

## 2016-12-25 NOTE — Telephone Encounter (Signed)
Fill out Rx for # 20 tabs this should be her last Rx. Time to wean off. Let her know , I tried home and cell and could not reach her. thanks

## 2016-12-25 NOTE — Telephone Encounter (Signed)
I left voicemail advising patient that script is written and can be picked up in [facility] office after 8am tomorrow. Did explain that Dr. Lorin Mercy is weaning her down and script has changed.

## 2016-12-25 NOTE — Addendum Note (Signed)
Addended by: Meyer Cory on: 12/25/2016 04:37 PM   Modules accepted: Orders

## 2016-12-25 NOTE — Telephone Encounter (Signed)
Please advise 

## 2016-12-26 ENCOUNTER — Other Ambulatory Visit (INDEPENDENT_AMBULATORY_CARE_PROVIDER_SITE_OTHER): Payer: Self-pay | Admitting: Radiology

## 2016-12-26 DIAGNOSIS — M1990 Unspecified osteoarthritis, unspecified site: Secondary | ICD-10-CM | POA: Diagnosis not present

## 2016-12-26 DIAGNOSIS — Z4781 Encounter for orthopedic aftercare following surgical amputation: Secondary | ICD-10-CM | POA: Diagnosis not present

## 2016-12-26 DIAGNOSIS — E1140 Type 2 diabetes mellitus with diabetic neuropathy, unspecified: Secondary | ICD-10-CM | POA: Diagnosis not present

## 2016-12-26 DIAGNOSIS — M519 Unspecified thoracic, thoracolumbar and lumbosacral intervertebral disc disorder: Secondary | ICD-10-CM | POA: Diagnosis not present

## 2016-12-26 DIAGNOSIS — M48061 Spinal stenosis, lumbar region without neurogenic claudication: Secondary | ICD-10-CM | POA: Diagnosis not present

## 2016-12-26 DIAGNOSIS — G8929 Other chronic pain: Secondary | ICD-10-CM | POA: Diagnosis not present

## 2016-12-26 MED ORDER — HYDROCODONE-ACETAMINOPHEN 5-325 MG PO TABS: 1.0000 | ORAL_TABLET | Freq: Four times a day (QID) | ORAL | 0 refills | Status: DC | PRN

## 2016-12-29 DIAGNOSIS — G8929 Other chronic pain: Secondary | ICD-10-CM | POA: Diagnosis not present

## 2016-12-29 DIAGNOSIS — E1140 Type 2 diabetes mellitus with diabetic neuropathy, unspecified: Secondary | ICD-10-CM | POA: Diagnosis not present

## 2016-12-29 DIAGNOSIS — M519 Unspecified thoracic, thoracolumbar and lumbosacral intervertebral disc disorder: Secondary | ICD-10-CM | POA: Diagnosis not present

## 2016-12-29 DIAGNOSIS — M1990 Unspecified osteoarthritis, unspecified site: Secondary | ICD-10-CM | POA: Diagnosis not present

## 2016-12-29 DIAGNOSIS — M48061 Spinal stenosis, lumbar region without neurogenic claudication: Secondary | ICD-10-CM | POA: Diagnosis not present

## 2016-12-29 DIAGNOSIS — Z4781 Encounter for orthopedic aftercare following surgical amputation: Secondary | ICD-10-CM | POA: Diagnosis not present

## 2016-12-31 DIAGNOSIS — M519 Unspecified thoracic, thoracolumbar and lumbosacral intervertebral disc disorder: Secondary | ICD-10-CM | POA: Diagnosis not present

## 2016-12-31 DIAGNOSIS — M48061 Spinal stenosis, lumbar region without neurogenic claudication: Secondary | ICD-10-CM | POA: Diagnosis not present

## 2016-12-31 DIAGNOSIS — M1990 Unspecified osteoarthritis, unspecified site: Secondary | ICD-10-CM | POA: Diagnosis not present

## 2016-12-31 DIAGNOSIS — Z4781 Encounter for orthopedic aftercare following surgical amputation: Secondary | ICD-10-CM | POA: Diagnosis not present

## 2016-12-31 DIAGNOSIS — G8929 Other chronic pain: Secondary | ICD-10-CM | POA: Diagnosis not present

## 2016-12-31 DIAGNOSIS — E1140 Type 2 diabetes mellitus with diabetic neuropathy, unspecified: Secondary | ICD-10-CM | POA: Diagnosis not present

## 2017-01-02 ENCOUNTER — Telehealth (INDEPENDENT_AMBULATORY_CARE_PROVIDER_SITE_OTHER): Payer: Self-pay | Admitting: *Deleted

## 2017-01-02 NOTE — Telephone Encounter (Signed)
Pt calling stating she had surgery 3/16 and is in a lot of pain, she cannot sleep or walk well. Pt asking for pain medication.

## 2017-01-05 MED ORDER — HYDROCODONE-ACETAMINOPHEN 5-325 MG PO TABS: 1.0000 | ORAL_TABLET | Freq: Four times a day (QID) | ORAL | 0 refills | Status: DC | PRN

## 2017-01-05 NOTE — Telephone Encounter (Signed)
Script entered and put up front for pick up

## 2017-01-05 NOTE — Telephone Encounter (Signed)
Please advise 

## 2017-01-05 NOTE — Telephone Encounter (Signed)
Dr. Yates spoke with patient and advised. 

## 2017-01-05 NOTE — Telephone Encounter (Signed)
[facility] for   Refill of # 20  norco. She can pick up. Has ROV on Friday , we can repeat xrays lumbar then and consider MRI scan if needed.

## 2017-01-06 ENCOUNTER — Telehealth (INDEPENDENT_AMBULATORY_CARE_PROVIDER_SITE_OTHER): Payer: Self-pay | Admitting: Orthopaedic Surgery

## 2017-01-06 ENCOUNTER — Ambulatory Visit (INDEPENDENT_AMBULATORY_CARE_PROVIDER_SITE_OTHER): Payer: Medicare Other

## 2017-01-06 ENCOUNTER — Encounter (INDEPENDENT_AMBULATORY_CARE_PROVIDER_SITE_OTHER): Payer: Self-pay | Admitting: Orthopaedic Surgery

## 2017-01-06 ENCOUNTER — Ambulatory Visit (INDEPENDENT_AMBULATORY_CARE_PROVIDER_SITE_OTHER): Payer: Medicare Other | Admitting: Orthopaedic Surgery

## 2017-01-06 VITALS — BP 133/74 | HR 80 | Ht 67.0 in | Wt 192.0 lb

## 2017-01-06 DIAGNOSIS — M25561 Pain in right knee: Secondary | ICD-10-CM | POA: Diagnosis not present

## 2017-01-06 DIAGNOSIS — M48061 Spinal stenosis, lumbar region without neurogenic claudication: Secondary | ICD-10-CM | POA: Diagnosis not present

## 2017-01-06 DIAGNOSIS — M25562 Pain in left knee: Secondary | ICD-10-CM

## 2017-01-06 DIAGNOSIS — E1140 Type 2 diabetes mellitus with diabetic neuropathy, unspecified: Secondary | ICD-10-CM | POA: Diagnosis not present

## 2017-01-06 DIAGNOSIS — M545 Low back pain: Secondary | ICD-10-CM | POA: Diagnosis not present

## 2017-01-06 DIAGNOSIS — M519 Unspecified thoracic, thoracolumbar and lumbosacral intervertebral disc disorder: Secondary | ICD-10-CM | POA: Diagnosis not present

## 2017-01-06 DIAGNOSIS — G8929 Other chronic pain: Secondary | ICD-10-CM | POA: Diagnosis not present

## 2017-01-06 DIAGNOSIS — M1990 Unspecified osteoarthritis, unspecified site: Secondary | ICD-10-CM | POA: Diagnosis not present

## 2017-01-06 DIAGNOSIS — Z4781 Encounter for orthopedic aftercare following surgical amputation: Secondary | ICD-10-CM | POA: Diagnosis not present

## 2017-01-06 DIAGNOSIS — M961 Postlaminectomy syndrome, not elsewhere classified: Secondary | ICD-10-CM

## 2017-01-06 NOTE — Telephone Encounter (Signed)
Can you please put patient in at 2:30p today? She fell today. Recent back surgery.

## 2017-01-06 NOTE — Progress Notes (Signed)
Post-Op Visit Note   Patient: Melanie [name]           Date of Birth: 1946/02/24           MRN: [id number] Visit Date: 01/06/2017 PCP: Marton Redwood, MD   Assessment & Plan: Since the patient's surgery on 11/28/2016 patient's had persistent left anterior thigh pain as she had prior to the surgical procedure. Should decompression L3-4 level. L4-5 level was auto fused from degenerative problems. She states her back is felt good but her leg still gives her problems and the she fell yesterday in the shower which is slipped or tripped on the mat that folded up landing and injuring her knee as she slid down the toe. She is ambulatory with the cane. Manual testing quads strong no abductor weakness. Decreased sensation anterior thigh on the left normal on the right. Radiographs are obtained and shows the previous decompression at L3-4 with removal of spinous process and central decompression. He taking Neurontin. She still having to take some hydrocodone for pain intermittently. She gets relief with sitting. We'll proceed with an MRI scan with and without the contrast evaluate her for recurrent disc rupture with compression on the left at L3-4. FOLLOW-up after scan.  Chief Complaint:  Chief Complaint  Patient presents with  . Lower Back - Pain  . Left Knee - Pain  . Right Knee - Pain   Visit Diagnoses:  1. Acute pain of both knees   2. Acute bilateral low back pain, with sciatica presence unspecified     Plan: MRI lumbar with and without contrast rule out recurrent HNP with nerve compression left L3-4  Follow-Up Instructions: No Follow-up on file.   Orders:  Orders Placed This Encounter  Procedures  . XR Knee 1-2 Views Right  . XR Knee 1-2 Views Left  . XR Lumbar Spine 2-3 Views   No orders of the defined types were placed in this encounter.   Imaging: No results found.  PMFS History: Patient Active Problem List   Diagnosis Date Noted  . HNP (herniated nucleus pulposus),  lumbar   . Cholelithiasis with chronic cholecystitis 12/28/2014  . Cholelithiasis with cholecystitis 12/26/2014  . Unspecified vitamin D deficiency 09/27/2014  . Diabetic neuropathy ([place]) 10/06/2013  . B12 deficiency 09/27/2013  . Spinal stenosis of lumbar region 07/20/2013  . HTN (hypertension)   . Bradycardia   . DM (diabetes mellitus) ([place])   . PAF (paroxysmal atrial fibrillation) ([facility])    Past Medical History:  Diagnosis Date  . Arthritis   . Bradycardia   . Depression    under control  . DM (diabetes mellitus) ([place])    type 2  . Dyspnea    with some exertion  . Family history of anesthesia complication    mother-nausea and vomiting  . GERD (gastroesophageal reflux disease)   . Headache   . HTN (hypertension)   . Neuropathy    legs and feet  . PAF (paroxysmal atrial fibrillation) ([facility]) 10 years ago   no problems since, due to stress  . Situational stress    "was taking care of sick husband"    Family History  Problem Relation Age of Onset  . Heart attack Father     Deceased, 53  . Other Mother     Deceased, 61  . Neuropathy Mother   . Hypertension Mother   . Ovarian cancer Sister     Deceased, 14  . Neuropathy Sister   . Multiple sclerosis Sister   .  Stroke Sister   . Hypertension Sister     Past Surgical History:  Procedure Laterality Date  . ABDOMINAL HYSTERECTOMY  1980   partial  . APPENDECTOMY  1980  . BACK SURGERY  2004  . CARDIAC CATHETERIZATION     in the late 90's/early 2000's  . CARPAL TUNNEL RELEASE Bilateral 2003, 2004  . CHOLECYSTECTOMY N/A 12/28/2014   Procedure: LAPAROSCOPIC CHOLECYSTECTOMY WITH INTRAOPERATIVE CHOLANGIOGRAM;  Surgeon: Armandina Gemma, MD;  Location: [facility];  Service: General;  Laterality: N/A;  . COLONOSCOPY  8 years ago  . JOINT REPLACEMENT Bilateral 2007  . KNEE ARTHROSCOPY Bilateral 1990's  . LAMINECTOMY  11/28/2016   L 3   . LUMBAR LAMINECTOMY N/A 07/20/2013   Procedure: MICRODISCECTOMY LUMBAR LAMINECTOMY;  Surgeon:  [name], MD;  Location: [facility];  Service: Orthopedics;  Laterality: N/A;  L2-3 Decompression  . LUMBAR LAMINECTOMY/DECOMPRESSION MICRODISCECTOMY N/A 11/28/2016   Procedure: L3 LAMINECTOMY, PARTIAL LAMINECTOMY WITH REMOVAL OF FREE FRAGMENT LEFT L3-4;  Surgeon: [name], MD;  Location: [facility];  Service: Orthopedics;  Laterality: N/A;   Social History   Occupational History  . Not on file.   Social History Main Topics  . Smoking status: Current Every Day Smoker    Packs/day: 0.50    Years: 30.00    Types: Cigarettes  . Smokeless tobacco: Never Used  . Alcohol use No  . Drug use: No  . Sexual activity: Not Currently

## 2017-01-06 NOTE — Telephone Encounter (Signed)
Pt phys therapist called and thinks pt needs to be seen sooner then Friday because she fell in the bath tub. Pt having pain on surgery site area, left leg and lower back.   Please advise if pt needs to come in.   [phone]

## 2017-01-09 ENCOUNTER — Ambulatory Visit (INDEPENDENT_AMBULATORY_CARE_PROVIDER_SITE_OTHER): Payer: Medicare Other | Admitting: Orthopaedic Surgery

## 2017-01-12 ENCOUNTER — Telehealth (INDEPENDENT_AMBULATORY_CARE_PROVIDER_SITE_OTHER): Payer: Self-pay | Admitting: Orthopaedic Surgery

## 2017-01-12 MED ORDER — HYDROCODONE-ACETAMINOPHEN 5-325 MG PO TABS: 1.0000 | ORAL_TABLET | Freq: Four times a day (QID) | ORAL | 0 refills | Status: DC | PRN

## 2017-01-12 NOTE — Telephone Encounter (Signed)
Please advise 

## 2017-01-12 NOTE — Addendum Note (Signed)
Addended by: Meyer Cory on: 01/12/2017 02:58 PM   Modules accepted: Orders

## 2017-01-12 NOTE — Telephone Encounter (Signed)
Patient called asking for a refill on hydrocodone. She's experiencing a lot of pain. CB # [phone]

## 2017-01-12 NOTE — Telephone Encounter (Signed)
At front for pick up.

## 2017-01-12 NOTE — Telephone Encounter (Signed)
OK refill # 20 I will sign thanks

## 2017-01-18 ENCOUNTER — Ambulatory Visit
Admission: RE | Admit: 2017-01-18 | Discharge: 2017-01-18 | Disposition: A | Discharge status: Home / Self Care | Payer: Medicare Other | Source: Ambulatory Visit | Attending: Orthopaedic Surgery | Admitting: Orthopaedic Surgery

## 2017-01-18 DIAGNOSIS — M961 Postlaminectomy syndrome, not elsewhere classified: Secondary | ICD-10-CM

## 2017-01-18 DIAGNOSIS — M48061 Spinal stenosis, lumbar region without neurogenic claudication: Secondary | ICD-10-CM | POA: Diagnosis not present

## 2017-01-18 MED ORDER — GADOBENATE DIMEGLUMINE 529 MG/ML IV SOLN
10.0000 mL | Freq: Once | INTRAVENOUS | Status: AC | PRN
  Administered 2017-01-18: 10 mL via INTRAVENOUS

## 2017-01-20 ENCOUNTER — Encounter (INDEPENDENT_AMBULATORY_CARE_PROVIDER_SITE_OTHER): Payer: Self-pay | Admitting: Orthopaedic Surgery

## 2017-01-20 ENCOUNTER — Ambulatory Visit (INDEPENDENT_AMBULATORY_CARE_PROVIDER_SITE_OTHER): Payer: Medicare Other | Admitting: Orthopaedic Surgery

## 2017-01-20 VITALS — BP 150/82 | HR 73 | Ht 67.0 in | Wt 192.0 lb

## 2017-01-20 DIAGNOSIS — M5126 Other intervertebral disc displacement, lumbar region: Secondary | ICD-10-CM | POA: Insufficient documentation

## 2017-01-20 MED ORDER — HYDROCODONE-ACETAMINOPHEN 5-325 MG PO TABS: 1.0000 | ORAL_TABLET | Freq: Four times a day (QID) | ORAL | 0 refills | Status: DC | PRN

## 2017-01-20 NOTE — Progress Notes (Signed)
Office Visit Note   Patient: Melanie [name]           Date of Birth: [birth year]           MRN: [id number] Visit Date: 01/20/2017              Requested by: Marton Redwood, MD [address] [place], [place] [address] PCP: Marton Redwood, MD   Assessment & Plan: Visit Diagnoses:  1. Recurrent herniation of lumbar disc            Left L3-4 recurrent disc which occurred approximately 2-1/2-3 weeks after surgery.  Plan: We'll set her up for left L3-4 likely foraminal injection with Dr. name to see if this can get her some relief. MRI scan showed recurrent disc herniation with satisfactory central decompression.  Follow-Up Instructions: No Follow-up on file.   Orders:  Orders Placed This Encounter  Procedures  . Ambulatory referral to Physical Medicine Rehab   Meds ordered this encounter  Medications  . HYDROcodone-acetaminophen (NORCO/VICODIN) 5-325 MG tablet    Sig: Take 1 tablet by mouth every 6 (six) hours as needed for moderate pain.    Dispense:  40 tablet    Refill:  0      Procedures: No procedures performed   Clinical Data: No additional findings.   Subjective: Chief Complaint  Patient presents with  . Lower Back - Pain    HPI patient returns with recurrent back and left leg pain. She denies right leg pain. After surgery she went to skilled facility was doing well when she was there for 10 days and she was home for about 2 days and then suddenly developed recurrent back and left leg symptoms exactly as before. MRI scan is been obtained and is available for review. She's been using a cane to keep from falling.  Review of Systems review of systems is updated from surgery date and is unchanged other than as mentioned above.   Objective: Vital Signs: BP (!) 150/82   Pulse 73   Ht 5\' 7"  (1.702 m)   Wt 192 lb (87.1 kg)   BMI 30.07 kg/m   Physical Exam quad test strong on manual testing. No adductor weakness. Anterior tib EHL strong.  Ortho Exam chest  some decreased sensation left L3 and L4 dermatome.  Specialty Comments:  No specialty comments available.  Imaging: Study Result   CLINICAL DATA:  Severe low back pain with left leg pain since surgery. Evaluate for L3-4 disc herniation  EXAM: MRI LUMBAR SPINE WITHOUT AND WITH CONTRAST  TECHNIQUE: Multiplanar and multiecho pulse sequences of the lumbar spine were obtained without and with intravenous contrast.  Creatinine was obtained on site at [facility] at [address]  Results: Creatinine 1.3 mg/dL.  CONTRAST:  75mL MULTIHANCE GADOBENATE DIMEGLUMINE 529 MG/ML IV SOLN  COMPARISON:  11/02/2016  FINDINGS: Segmentation:  Standard.  Alignment:  Stable.  Negative for listhesis.  Vertebrae:  No acute fracture, discitis, or aggressive bone lesion.  Conus medullaris: Extends to the L1 level and appears normal.  Paraspinal and other soft tissues: Expected postoperative distortion after L3 laminectomy. Ovoid, small incisional fluid at the most recent operative site which is non worrisome.  Smooth left renal atrophy  Disc levels:  T12- L1: Mild disc narrowing and desiccation.  No impingement  L1-L2: Spondylosis and disc narrowing with mild bulging. Mild facet spurring. Ventral thecal sac flattening without compressive stenosis  L2-L3: Prior posterior decompression with good canal patency. Stable disc narrowing and  bulging effacing the inferior foramina. No compressive stenosis  L3-L4: Improved thecal sac patency after posterior decompression. There is a recurrent left paracentral disc herniation which impinges on the L3 nerve as it exits the canal. There is a smaller left foraminal herniation impinging at L3 from below. Expected postoperative fibrosis without mass effect.  L4-L5: Posterior decompression and solid interbody fusion or ankylosis. Patent canal and foramina.  L5-S1:Minor facet spurring.  No herniation or  impingement.  IMPRESSION: 1. L3-4 recurrent left paracentral disc protrusion impinging on L3 at the entry to the foramen. Smaller left foraminal protrusion impinging on L3 from below. Improved thecal sac patency after interval laminectomy. 2. Prior L2-3 and L4-5 posterior decompression with good spinal canal patency.   Electronically Signed   By: Monte Fantasia M.D.   On: 01/18/2017 17:05      PMFS History: Patient Active Problem List   Diagnosis Date Noted  . 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 HNP (herniated nucleus pulposus), lumbar   . Cholelithiasis with chronic cholecystitis 12/28/2014  . Cholelithiasis with cholecystitis 12/26/2014  . Unspecified vitamin D deficiency 09/27/2014  . Diabetic neuropathy ([place]) 10/06/2013  . B12 deficiency 09/27/2013  . Spinal stenosis of lumbar region 07/20/2013  . HTN (hypertension)   . Bradycardia   . DM (diabetes mellitus) ([place])   . PAF (paroxysmal atrial fibrillation) ([facility])    Past Medical History:  Diagnosis Date  . Arthritis   . Bradycardia   . Depression    under control  . DM (diabetes mellitus) ([facility])    type 2  . Dyspnea    with some exertion  . Family history of anesthesia complication    mother-nausea and vomiting  . GERD (gastroesophageal reflux disease)   . Headache   . HTN (hypertension)   . Neuropathy    legs and feet  . PAF (paroxysmal atrial fibrillation) ([facility]) 10 years ago   no problems since, due to stress  . Situational stress    "was taking care of sick husband"    Family History  Problem Relation Age of Onset  . Heart attack Father     Deceased, 6  . Other Mother     Deceased, 26  . Neuropathy Mother   . Hypertension Mother   . Ovarian cancer Sister     Deceased, 48  . Neuropathy Sister   . Multiple sclerosis Sister   . Stroke Sister   . Hypertension Sister     Past Surgical History:  Procedure Laterality Date  . ABDOMINAL  HYSTERECTOMY  1980   partial  . APPENDECTOMY  1980  . BACK SURGERY  2004  . CARDIAC CATHETERIZATION     in the late 90's/early 2000's  . CARPAL TUNNEL RELEASE Bilateral 2003, 2004  . CHOLECYSTECTOMY N/A 12/28/2014   Procedure: LAPAROSCOPIC CHOLECYSTECTOMY WITH INTRAOPERATIVE CHOLANGIOGRAM;  Surgeon: Armandina Gemma, MD;  Location: [facility];  Service: General;  Laterality: N/A;  . COLONOSCOPY  8 years ago  . JOINT REPLACEMENT Bilateral 2007  . KNEE ARTHROSCOPY Bilateral 1990's  . LAMINECTOMY  11/28/2016   L 3   . LUMBAR LAMINECTOMY N/A 07/20/2013   Procedure: MICRODISCECTOMY LUMBAR LAMINECTOMY;  Surgeon: [name], MD;  Location: [facility];  Service: Orthopedics;  Laterality: N/A;  L2-3 Decompression  . LUMBAR LAMINECTOMY/DECOMPRESSION MICRODISCECTOMY N/A 11/28/2016   Procedure: L3 LAMINECTOMY, PARTIAL LAMINECTOMY WITH REMOVAL OF FREE FRAGMENT LEFT L3-4;  Surgeon: [name], MD;  Location: [facility];  Service: Orthopedics;  Laterality: N/A;   Social History   Occupational History  . Not on file.   Social History Main Topics  . Smoking status: Current Every Day Smoker    Packs/day: 0.50    Years: 30.00    Types: Cigarettes  . Smokeless tobacco: Never Used  . Alcohol use No  . Drug use: No  . Sexual activity: Not Currently

## 2017-01-22 ENCOUNTER — Telehealth (INDEPENDENT_AMBULATORY_CARE_PROVIDER_SITE_OTHER): Payer: Self-pay | Admitting: Orthopaedic Surgery

## 2017-01-22 NOTE — Telephone Encounter (Signed)
Patient called asked for a call back concerning her appointment with Dr Ernestina Patches. Patient asked if she can get a sooner appointment. Patient said a week is a long time to wait. The number to contact patient is [phone]

## 2017-01-26 NOTE — Telephone Encounter (Signed)
I attempted to reach patient to advise that is the first appt Dr. Ernestina Patches has. She would not be able to get in sooner at [facility], I called to check there as well. Could not get an answer or leave voicemail.

## 2017-01-29 ENCOUNTER — Ambulatory Visit (INDEPENDENT_AMBULATORY_CARE_PROVIDER_SITE_OTHER): Payer: Self-pay

## 2017-01-29 ENCOUNTER — Encounter (INDEPENDENT_AMBULATORY_CARE_PROVIDER_SITE_OTHER): Payer: Self-pay | Admitting: Physical Medicine and Rehabilitation

## 2017-01-29 ENCOUNTER — Telehealth (INDEPENDENT_AMBULATORY_CARE_PROVIDER_SITE_OTHER): Payer: Self-pay | Admitting: Orthopaedic Surgery

## 2017-01-29 ENCOUNTER — Ambulatory Visit (INDEPENDENT_AMBULATORY_CARE_PROVIDER_SITE_OTHER): Payer: Medicare Other | Admitting: Physical Medicine and Rehabilitation

## 2017-01-29 VITALS — BP 128/65 | HR 80

## 2017-01-29 DIAGNOSIS — M5116 Intervertebral disc disorders with radiculopathy, lumbar region: Secondary | ICD-10-CM

## 2017-01-29 DIAGNOSIS — M5416 Radiculopathy, lumbar region: Secondary | ICD-10-CM

## 2017-01-29 DIAGNOSIS — M961 Postlaminectomy syndrome, not elsewhere classified: Secondary | ICD-10-CM

## 2017-01-29 MED ORDER — METHYLPREDNISOLONE ACETATE 80 MG/ML IJ SUSP
80.0000 mg | Freq: Once | INTRAMUSCULAR | Status: AC
  Administered 2017-01-29: 80 mg

## 2017-01-29 MED ORDER — HYDROCODONE-ACETAMINOPHEN 5-325 MG PO TABS: 1.0000 | ORAL_TABLET | Freq: Four times a day (QID) | ORAL | 0 refills | Status: DC | PRN

## 2017-01-29 MED ORDER — LIDOCAINE HCL (PF) 1 % IJ SOLN
2.0000 mL | Freq: Once | INTRAMUSCULAR | Status: AC
  Administered 2017-01-29: 2 mL

## 2017-01-29 NOTE — Telephone Encounter (Signed)
Please advise. Patient has injection with Dr. Ernestina Patches scheduled today. If ok, I can have script print in [place] and have Jeneen Rinks sign it.

## 2017-01-29 NOTE — Telephone Encounter (Signed)
Rx printed, will have Jeneen Rinks sign and give to Amy to give to patient this afternoon.

## 2017-01-29 NOTE — Telephone Encounter (Signed)
Can you please enter and have Jeneen Rinks sign script for patient? She will be having ESI this afternoon. Thanks.

## 2017-01-29 NOTE — Patient Instructions (Signed)

## 2017-01-29 NOTE — Progress Notes (Deleted)
Pain across lower back and down front of both legs to knee. Worse on left. Pain with walking and standing. Relief with sitting and laying. Ambulating with cane. Numbness side of right thigh.

## 2017-01-29 NOTE — Telephone Encounter (Signed)
Patient called asked if she can get a Rx for (Hydrocodone) before she leave the office today. Patient said she has an appointment today with Dr Ernestina Patches at 3:45 pm. The number to contact patient is [phone]

## 2017-01-29 NOTE — Telephone Encounter (Signed)
OK send to [facility] thanks

## 2017-01-29 NOTE — Telephone Encounter (Signed)
Amy, I will bring you the Rx, thanks.  See below.

## 2017-02-02 NOTE — Procedures (Signed)
Lumbosacral Transforaminal Epidural Steroid Injection - Infraneural Approach with Fluoroscopic Guidance  Patient: Melanie [name]      Date of Birth: [birth year] MRN: [id number] PCP: Marton Redwood, MD      Visit Date: 01/29/2017   Universal Protocol:    Date/Time: 05/21/186:02 AM  Consent Given By: the patient  Position: PRONE   Additional Comments: Vital signs were monitored before and after the procedure. Patient was prepped and draped in the usual sterile fashion. The correct patient, procedure, and site was verified.   Injection Procedure Details:  Procedure Site One Meds Administered:  Meds ordered this encounter  Medications  . lidocaine (PF) (XYLOCAINE) 1 % injection 2 mL  . methylPREDNISolone acetate (DEPO-MEDROL) injection 80 mg      Laterality: Left  Location/Site:  L3-L4  Needle size: 22 G  Needle type: Spinal  Needle Placement: Transforaminal  Findings:  -Contrast Used: 1 mL iohexol 180 mg iodine/mL   -Comments: Excellent flow of contrast along the nerve and into the epidural space.  Procedure Details: After squaring off the end-plates of the desired vertebral level to get a true AP view, the C-arm was obliqued to the painful side so that the superior articulating process is positioned about 1/3 the length of the inferior endplate.  The needle was aimed toward the junction of the superior articular process and the transverse process of the inferior vertebrae. The needle's initial entry is in the lower third of the foramen through Kambin's triangle. The soft tissues overlying this target were infiltrated with 2-3 ml. of 1% Lidocaine without Epinephrine.  The spinal needle was then inserted and advanced toward the target using a "trajectory" view along the fluoroscope beam.  Under AP and lateral visualization, the needle was advanced so it did not puncture dura and did not traverse medially beyond the 6 o'clock position of the pedicle. Bi-planar projections  were used to confirm position. Aspiration was confirmed to be negative for CSF and/or blood. A 1-2 ml. volume of Isovue-250 was injected and flow of contrast was noted at each level. Radiographs were obtained for documentation purposes.   After attaining the desired flow of contrast documented above, a 0.5 to 1.0 ml test dose of 0.25% Marcaine was injected into each respective transforaminal space.  The patient was observed for 90 seconds post injection.  After no sensory deficits were reported, and normal lower extremity motor function was noted,   the above injectate was administered so that equal amounts of the injectate were placed at each foramen (level) into the transforaminal epidural space.   Additional Comments:  No complications occurred Dressing: Band-Aid    Post-procedure details: Patient was observed during the procedure. Post-procedure instructions were reviewed.  Patient left the clinic in stable condition.

## 2017-02-02 NOTE — Progress Notes (Signed)
Melanie [name] - 70 y.o. female MRN [id number]  Date of birth: November 09, 1945  Office Visit Note: Visit Date: 01/29/2017 PCP: Marton Redwood, MD Referred by: Marton Redwood, MD  Subjective: Chief Complaint  Patient presents with  . Lower Back - Pain   HPI: Melanie [name] is a 70 year old female that comes in his request of Dr. Lorin Mercy for diagnostic and therapeutic left L3 transforaminal epidural steroid injection. MRI is reviewed below. She has had decompression laminectomy at L3-4 with recurrent left paracentral foraminal disc herniation. The patient and her daughter are somewhat confused and that they think the level surgery was in L4-5 and they were somewhat resistant lasted the injection was in a performed at L3. This is based on MRI findings and Dr. Narda Amber notes. Interesting the patient's having bilateral low back and bilateral leg pain. I do not see anything really on MRI that would correspond to the bilateral symptoms.    ROS Otherwise per HPI.  Assessment & Plan: Visit Diagnoses:  1. Lumbar radiculopathy   2. Radiculopathy due to lumbar intervertebral disc disorder   3. Post laminectomy syndrome     Plan: Findings:  Left L3 transforaminal epidural steroid injection with fluoroscopic guidance. There was excellent flow of contrast. The patient in general just does not tolerate the injection very well but she ultimately did okay and was discharged in good condition although she is having some increased low back pain.    Meds & Orders:  Meds ordered this encounter  Medications  . lidocaine (PF) (XYLOCAINE) 1 % injection 2 mL  . methylPREDNISolone acetate (DEPO-MEDROL) injection 80 mg    Orders Placed This Encounter  Procedures  . XR C-ARM NO REPORT  . Epidural Steroid injection    Follow-up: Return for Dr. Lorin Mercy.   Procedures: No procedures performed  Lumbosacral Transforaminal Epidural Steroid Injection - Infraneural Approach with Fluoroscopic Guidance  Patient: Melanie [name]      Date of Birth: 07-Apr-1946 MRN: [id number] PCP: Marton Redwood, MD      Visit Date: 01/29/2017   Universal Protocol:    Date/Time: 05/21/186:02 AM  Consent Given By: the patient  Position: PRONE   Additional Comments: Vital signs were monitored before and after the procedure. Patient was prepped and draped in the usual sterile fashion. The correct patient, procedure, and site was verified.   Injection Procedure Details:  Procedure Site One Meds Administered:  Meds ordered this encounter  Medications  . lidocaine (PF) (XYLOCAINE) 1 % injection 2 mL  . methylPREDNISolone acetate (DEPO-MEDROL) injection 80 mg      Laterality: Left  Location/Site:  L3-L4  Needle size: 22 G  Needle type: Spinal  Needle Placement: Transforaminal  Findings:  -Contrast Used: 1 mL iohexol 180 mg iodine/mL   -Comments: Excellent flow of contrast along the nerve and into the epidural space.  Procedure Details: After squaring off the end-plates of the desired vertebral level to get a true AP view, the C-arm was obliqued to the painful side so that the superior articulating process is positioned about 1/3 the length of the inferior endplate.  The needle was aimed toward the junction of the superior articular process and the transverse process of the inferior vertebrae. The needle's initial entry is in the lower third of the foramen through Kambin's triangle. The soft tissues overlying this target were infiltrated with 2-3 ml. of 1% Lidocaine without Epinephrine.  The spinal needle was then inserted and advanced toward the target using a "trajectory" view along the  fluoroscope beam.  Under AP and lateral visualization, the needle was advanced so it did not puncture dura and did not traverse medially beyond the 6 o'clock position of the pedicle. Bi-planar projections were used to confirm position. Aspiration was confirmed to be negative for CSF and/or blood. A 1-2 ml. volume of Isovue-250  was injected and flow of contrast was noted at each level. Radiographs were obtained for documentation purposes.   After attaining the desired flow of contrast documented above, a 0.5 to 1.0 ml test dose of 0.25% Marcaine was injected into each respective transforaminal space.  The patient was observed for 90 seconds post injection.  After no sensory deficits were reported, and normal lower extremity motor function was noted,   the above injectate was administered so that equal amounts of the injectate were placed at each foramen (level) into the transforaminal epidural space.   Additional Comments:  No complications occurred Dressing: Band-Aid    Post-procedure details: Patient was observed during the procedure. Post-procedure instructions were reviewed.  Patient left the clinic in stable condition.   Clinical History: MRI LUMBAR SPINE WITHOUT AND WITH CONTRAST  TECHNIQUE: Multiplanar and multiecho pulse sequences of the lumbar spine were obtained without and with intravenous contrast.  Creatinine was obtained on site at [facility] at [address]  Results: Creatinine 1.3 mg/dL.  CONTRAST:  42mL MULTIHANCE GADOBENATE DIMEGLUMINE 529 MG/ML IV SOLN  COMPARISON:  11/02/2016  FINDINGS: Segmentation:  Standard.  Alignment:  Stable.  Negative for listhesis.  Vertebrae:  No acute fracture, discitis, or aggressive bone lesion.  Conus medullaris: Extends to the L1 level and appears normal.  Paraspinal and other soft tissues: Expected postoperative distortion after L3 laminectomy. Ovoid, small incisional fluid at the most recent operative site which is non worrisome.  Smooth left renal atrophy  Disc levels:  T12- L1: Mild disc narrowing and desiccation.  No impingement  L1-L2: Spondylosis and disc narrowing with mild bulging. Mild facet spurring. Ventral thecal sac flattening without compressive stenosis  L2-L3: Prior posterior decompression  with good canal patency. Stable disc narrowing and bulging effacing the inferior foramina. No compressive stenosis  L3-L4: Improved thecal sac patency after posterior decompression. There is a recurrent left paracentral disc herniation which impinges on the L3 nerve as it exits the canal. There is a smaller left foraminal herniation impinging at L3 from below. Expected postoperative fibrosis without mass effect.  L4-L5: Posterior decompression and solid interbody fusion or ankylosis. Patent canal and foramina.  L5-S1:Minor facet spurring.  No herniation or impingement.  IMPRESSION: 1. L3-4 recurrent left paracentral disc protrusion impinging on L3 at the entry to the foramen. Smaller left foraminal protrusion impinging on L3 from below. Improved thecal sac patency after interval laminectomy. 2. Prior L2-3 and L4-5 posterior decompression with good spinal canal patency.   Electronically Signed   By: Monte Fantasia M.D.   On: 01/18/2017 17:05  She reports that she has been smoking Cigarettes.  She has a 15.00 pack-year smoking history. She has never used smokeless tobacco.   Recent Labs  11/27/16 1342  HGBA1C 6.5*    Objective:  VS:  HT:    WT:   BMI:     BP:128/65  HR:80bpm  TEMP: ( )  RESP:95 % Physical Exam  Musculoskeletal:  Patient ambulates slowly with a cane and is slow to rise from a seated position but has good distal strength.    Ortho Exam Imaging: No results found.  Past Medical/Family/Surgical/Social History: Medications &  Allergies reviewed per EMR Patient Active Problem List   Diagnosis Date Noted  . 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 HNP (herniated nucleus pulposus), lumbar   . Cholelithiasis with chronic cholecystitis 12/28/2014  . Cholelithiasis with cholecystitis 12/26/2014  . Unspecified vitamin D deficiency 09/27/2014  . Diabetic neuropathy ([place]) 10/06/2013  . B12 deficiency  09/27/2013  . Spinal stenosis of lumbar region 07/20/2013  . HTN (hypertension)   . Bradycardia   . DM (diabetes mellitus) ([place])   . PAF (paroxysmal atrial fibrillation) ([facility])    Past Medical History:  Diagnosis Date  . Arthritis   . Bradycardia   . Depression    under control  . DM (diabetes mellitus) ([place])    type 2  . Dyspnea    with some exertion  . Family history of anesthesia complication    mother-nausea and vomiting  . GERD (gastroesophageal reflux disease)   . Headache   . HTN (hypertension)   . Neuropathy    legs and feet  . PAF (paroxysmal atrial fibrillation) ([facility]) 10 years ago   no problems since, due to stress  . Situational stress    "was taking care of sick husband"   Family History  Problem Relation Age of Onset  . Heart attack Father        Deceased, 50  . Other Mother        Deceased, 66  . Neuropathy Mother   . Hypertension Mother   . Ovarian cancer Sister        Deceased, 18  . Neuropathy Sister   . Multiple sclerosis Sister   . Stroke Sister   . Hypertension Sister    Past Surgical History:  Procedure Laterality Date  . ABDOMINAL HYSTERECTOMY  1980   partial  . APPENDECTOMY  1980  . BACK SURGERY  2004  . CARDIAC CATHETERIZATION     in the late 90's/early 2000's  . CARPAL TUNNEL RELEASE Bilateral 2003, 2004  . CHOLECYSTECTOMY N/A 12/28/2014   Procedure: LAPAROSCOPIC CHOLECYSTECTOMY WITH INTRAOPERATIVE CHOLANGIOGRAM;  Surgeon: Armandina Gemma, MD;  Location: [facility];  Service: General;  Laterality: N/A;  . COLONOSCOPY  8 years ago  . JOINT REPLACEMENT Bilateral 2007  . KNEE ARTHROSCOPY Bilateral 1990's  . LAMINECTOMY  11/28/2016   L 3   . LUMBAR LAMINECTOMY N/A 07/20/2013   Procedure: MICRODISCECTOMY LUMBAR LAMINECTOMY;  Surgeon: [name], MD;  Location: [facility];  Service: Orthopedics;  Laterality: N/A;  L2-3 Decompression  . LUMBAR LAMINECTOMY/DECOMPRESSION MICRODISCECTOMY N/A 11/28/2016   Procedure: L3 LAMINECTOMY, PARTIAL LAMINECTOMY  WITH REMOVAL OF FREE FRAGMENT LEFT L3-4;  Surgeon: [name], MD;  Location: [facility];  Service: Orthopedics;  Laterality: N/A;   Social History   Occupational History  . Not on file.   Social History Main Topics  . Smoking status: Current Every Day Smoker    Packs/day: 0.50    Years: 30.00    Types: Cigarettes  . Smokeless tobacco: Never Used  . Alcohol use No  . Drug use: No  . Sexual activity: Not Currently

## 2017-02-03 ENCOUNTER — Encounter: Payer: Self-pay | Admitting: Internal Medicine

## 2017-02-06 ENCOUNTER — Other Ambulatory Visit (INDEPENDENT_AMBULATORY_CARE_PROVIDER_SITE_OTHER): Payer: Self-pay | Admitting: Family

## 2017-02-06 ENCOUNTER — Telehealth (INDEPENDENT_AMBULATORY_CARE_PROVIDER_SITE_OTHER): Payer: Self-pay

## 2017-02-06 ENCOUNTER — Telehealth (INDEPENDENT_AMBULATORY_CARE_PROVIDER_SITE_OTHER): Payer: Self-pay | Admitting: *Deleted

## 2017-02-06 MED ORDER — HYDROCODONE-ACETAMINOPHEN 5-325 MG PO TABS: 1.0000 | ORAL_TABLET | Freq: Four times a day (QID) | ORAL | 0 refills | Status: DC | PRN

## 2017-02-06 NOTE — Telephone Encounter (Signed)
Left pt a message to call back to schedule appt with Dr. Lorin Mercy. Looks like his first available appt at this point is 03/10/17. Do you think you could get her in any sooner?

## 2017-02-06 NOTE — Telephone Encounter (Signed)
Can you advise since Dr. Lorin Mercy is out of the office? Patient had L3 laminectomy and removal of free fragment at L3-4 on 11/28/16. She has a recurrent herniation post surgery. She had ESI with Dr. Ernestina Patches on 01/29/2017 with no relief. Am scheduling follow up appt with Dr. Lorin Mercy for next week.  Last RX was Hydrocodone 5/325 1 po q 6 #40 on 01/29/2017.  She will be out by Monday and the office is closed.

## 2017-02-06 NOTE — Telephone Encounter (Signed)
Script at front for pick up. Patient advised.  

## 2017-02-06 NOTE — Telephone Encounter (Signed)
appt on 6/1 at 4

## 2017-02-06 NOTE — Telephone Encounter (Signed)
Patient called in this morning in regards to needing a prescription refill on her Hydrocodone? She will be out of pills by Monday. Her CB # [phone]. Thank you

## 2017-02-06 NOTE — Telephone Encounter (Signed)
Patient left vm stating she had no relief with injection she had on 01/29/17 and that you told her to call back in a week if it didn't help but she didn't know if you meant to call you back or Dr. Lorin Mercy.

## 2017-02-06 NOTE — Telephone Encounter (Signed)
She needs follow up with Lorin Mercy if no relief at all, the injection images look good

## 2017-02-13 ENCOUNTER — Ambulatory Visit (INDEPENDENT_AMBULATORY_CARE_PROVIDER_SITE_OTHER): Payer: Medicare Other | Admitting: Orthopaedic Surgery

## 2017-02-13 ENCOUNTER — Encounter (INDEPENDENT_AMBULATORY_CARE_PROVIDER_SITE_OTHER): Payer: Self-pay | Admitting: Orthopaedic Surgery

## 2017-02-13 VITALS — BP 128/74 | HR 74 | Ht 67.0 in | Wt 192.0 lb

## 2017-02-13 DIAGNOSIS — M5126 Other intervertebral disc displacement, lumbar region: Secondary | ICD-10-CM

## 2017-02-13 MED ORDER — HYDROCODONE-ACETAMINOPHEN 5-325 MG PO TABS: 1.0000 | ORAL_TABLET | Freq: Three times a day (TID) | ORAL | 0 refills | Status: DC | PRN

## 2017-02-13 NOTE — Progress Notes (Signed)
Office Visit Note   Patient: Melanie [name]           Date of Birth: [birth year]           MRN: [id number] Visit Date: 02/13/2017              Requested by: Marton Redwood, MD [address] [place], [place] [address] PCP: Marton Redwood, MD   Assessment & Plan: Visit Diagnoses:  1. HNP (herniated nucleus pulposus), lumbar     Plan: Will proceed with scheduling repeat L3-4 microdiscectomy. Patient understands what is involved.  States that she would like to have this done as soon as possible..  Follow-Up Instructions: Return in about 2 weeks (around 02/27/2017).   Orders:  No orders of the defined types were placed in this encounter.  Meds ordered this encounter  Medications  . HYDROcodone-acetaminophen (NORCO/VICODIN) 5-325 MG tablet    Sig: Take 1 tablet by mouth every 8 (eight) hours as needed for moderate pain.    Dispense:  40 tablet    Refill:  0      Procedures: No procedures performed   Clinical Data: No additional findings.   Subjective: Chief Complaint  Patient presents with  . Lower Back - Pain, Routine Post Op    HPI Patient returns with complaints of low back pain and left greater than right lower extremity radiculopathy. Patient has recurrent L3-4 HNP dosing on recent MRI scan. L3-4 ESI did not give any relief of her pain. States that Dr. Lorin Mercy had previously discussed doing repeat L3-4 microdiscectomy and she is wanting to  proceed with scheduling that. Pain mostly in the left low back that radiates into the left buttock and left thigh. Some pain in the right thigh also. Review of Systems  Constitutional: Positive for activity change.  Respiratory: Negative.   Gastrointestinal: Negative.   Genitourinary: Negative.   Musculoskeletal: Positive for back pain and gait problem.  Neurological: Positive for numbness.  Psychiatric/Behavioral: Negative.      Objective: Vital Signs: BP 128/74   Pulse 74   Ht 5\' 7"  (1.702 m)   Wt 192 lb (87.1 kg)    BMI 30.07 kg/m   Physical Exam  Constitutional: She is oriented to person, place, and time. No distress.  HENT:  Head: Normocephalic and atraumatic.  Eyes: EOM are normal. Pupils are equal, round, and reactive to light.  Pulmonary/Chest: No respiratory distress.  Musculoskeletal:  Gait antalgic. Negative logroll bilateral hips. Pain with left straight leg raise. Bilateral calves are nontender. Neurovascularly intact. No focal motor deficits.  Neurological: She is alert and oriented to person, place, and time.  Skin: Skin is warm and dry.    Ortho Exam  Specialty Comments:  No specialty comments available.  Imaging: No results found.   PMFS History: Patient Active Problem List   Diagnosis Date Noted  . 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 HNP (herniated nucleus pulposus), lumbar   . Cholelithiasis with chronic cholecystitis 12/28/2014  . Cholelithiasis with cholecystitis 12/26/2014  . Unspecified vitamin D deficiency 09/27/2014  . Diabetic neuropathy ([facility]) 10/06/2013  . B12 deficiency 09/27/2013  . Spinal stenosis of lumbar region 07/20/2013  . HTN (hypertension)   . Bradycardia   . DM (diabetes mellitus) ([facility])   . PAF (paroxysmal atrial fibrillation) ([facility])    Past Medical History:  Diagnosis Date  . Arthritis   . Bradycardia   . Depression    under control  . DM (  diabetes mellitus) ([facility])    type 2  . Dyspnea    with some exertion  . Family history of anesthesia complication    mother-nausea and vomiting  . GERD (gastroesophageal reflux disease)   . Headache   . HTN (hypertension)   . Neuropathy    legs and feet  . PAF (paroxysmal atrial fibrillation) ([facility]) 10 years ago   no problems since, due to stress  . Situational stress    "was taking care of sick husband"    Family History  Problem Relation Age of Onset  . Heart attack Father        Deceased, 30  . Other Mother        Deceased, 24  .  Neuropathy Mother   . Hypertension Mother   . Ovarian cancer Sister        Deceased, 57  . Neuropathy Sister   . Multiple sclerosis Sister   . Stroke Sister   . Hypertension Sister     Past Surgical History:  Procedure Laterality Date  . ABDOMINAL HYSTERECTOMY  1980   partial  . APPENDECTOMY  1980  . BACK SURGERY  2004  . CARDIAC CATHETERIZATION     in the late 90's/early 2000's  . CARPAL TUNNEL RELEASE Bilateral 2003, 2004  . CHOLECYSTECTOMY N/A 12/28/2014   Procedure: LAPAROSCOPIC CHOLECYSTECTOMY WITH INTRAOPERATIVE CHOLANGIOGRAM;  Surgeon: Armandina Gemma, MD;  Location: [facility];  Service: General;  Laterality: N/A;  . COLONOSCOPY  8 years ago  . JOINT REPLACEMENT Bilateral 2007  . KNEE ARTHROSCOPY Bilateral 1990's  . LAMINECTOMY  11/28/2016   L 3   . LUMBAR LAMINECTOMY N/A 07/20/2013   Procedure: MICRODISCECTOMY LUMBAR LAMINECTOMY;  Surgeon: [name], MD;  Location: [facility];  Service: Orthopedics;  Laterality: N/A;  L2-3 Decompression  . LUMBAR LAMINECTOMY/DECOMPRESSION MICRODISCECTOMY N/A 11/28/2016   Procedure: L3 LAMINECTOMY, PARTIAL LAMINECTOMY WITH REMOVAL OF FREE FRAGMENT LEFT L3-4;  Surgeon: [name], MD;  Location: [facility];  Service: Orthopedics;  Laterality: N/A;   Social History   Occupational History  . Not on file.   Social History Main Topics  . Smoking status: Current Every Day Smoker    Packs/day: 0.50    Years: 30.00    Types: Cigarettes  . Smokeless tobacco: Never Used  . Alcohol use No  . Drug use: No  . Sexual activity: Not Currently

## 2017-02-16 ENCOUNTER — Other Ambulatory Visit (INDEPENDENT_AMBULATORY_CARE_PROVIDER_SITE_OTHER): Payer: Self-pay | Admitting: Orthopaedic Surgery

## 2017-02-16 DIAGNOSIS — M5126 Other intervertebral disc displacement, lumbar region: Secondary | ICD-10-CM

## 2017-02-17 ENCOUNTER — Encounter (HOSPITAL_COMMUNITY): Payer: Self-pay | Admitting: *Deleted

## 2017-02-17 NOTE — Progress Notes (Signed)
Denies chest pain or shortness of breath given the following instructions for DM:  . If your blood sugar is less than 70 mg/dL, you will need to treat for low blood sugar: o Do not take insulin. o Treat a low blood sugar (less than 70 mg/dL) with  cup of clear juice (cranberry or apple), 4 glucose tablets, OR glucose gel. o Recheck blood sugar in 15 minutes after treatment (to make sure it is greater than 70 mg/dL). If your blood sugar is not greater than 70 mg/dL on recheck, call [phone] for further instructions. . Report your blood sugar to the short stay nurse when you get to Short Stay.

## 2017-02-18 ENCOUNTER — Ambulatory Visit (HOSPITAL_COMMUNITY): Payer: Medicare Other | Admitting: Certified Registered Nurse Anesthetist

## 2017-02-18 ENCOUNTER — Encounter (HOSPITAL_COMMUNITY): Payer: Self-pay | Admitting: General Practice

## 2017-02-18 ENCOUNTER — Observation Stay (HOSPITAL_COMMUNITY): Payer: Medicare Other

## 2017-02-18 ENCOUNTER — Encounter (HOSPITAL_COMMUNITY)
Admission: RE | Disposition: A | Discharge status: Home / Self Care | Payer: Self-pay | Source: Ambulatory Visit | Attending: Orthopaedic Surgery

## 2017-02-18 ENCOUNTER — Observation Stay (HOSPITAL_COMMUNITY)
Admission: RE | Admit: 2017-02-18 | Discharge: 2017-02-19 | Disposition: A | Discharge status: Home / Self Care | Payer: Medicare Other | Source: Ambulatory Visit | Attending: Orthopaedic Surgery | Admitting: Orthopaedic Surgery

## 2017-02-18 ENCOUNTER — Ambulatory Visit (HOSPITAL_COMMUNITY): Payer: Medicare Other

## 2017-02-18 DIAGNOSIS — F17210 Nicotine dependence, cigarettes, uncomplicated: Secondary | ICD-10-CM | POA: Diagnosis not present

## 2017-02-18 DIAGNOSIS — E119 Type 2 diabetes mellitus without complications: Secondary | ICD-10-CM | POA: Diagnosis not present

## 2017-02-18 DIAGNOSIS — F329 Major depressive disorder, single episode, unspecified: Secondary | ICD-10-CM | POA: Insufficient documentation

## 2017-02-18 DIAGNOSIS — M5116 Intervertebral disc disorders with radiculopathy, lumbar region: Principal | ICD-10-CM | POA: Insufficient documentation

## 2017-02-18 DIAGNOSIS — K8010 Calculus of gallbladder with chronic cholecystitis without obstruction: Secondary | ICD-10-CM | POA: Diagnosis not present

## 2017-02-18 DIAGNOSIS — Z7984 Long term (current) use of oral hypoglycemic drugs: Secondary | ICD-10-CM | POA: Insufficient documentation

## 2017-02-18 DIAGNOSIS — M5126 Other intervertebral disc displacement, lumbar region: Secondary | ICD-10-CM | POA: Diagnosis not present

## 2017-02-18 DIAGNOSIS — M961 Postlaminectomy syndrome, not elsewhere classified: Secondary | ICD-10-CM | POA: Diagnosis not present

## 2017-02-18 DIAGNOSIS — I480 Paroxysmal atrial fibrillation: Secondary | ICD-10-CM | POA: Diagnosis not present

## 2017-02-18 DIAGNOSIS — I10 Essential (primary) hypertension: Secondary | ICD-10-CM | POA: Diagnosis not present

## 2017-02-18 DIAGNOSIS — Z7982 Long term (current) use of aspirin: Secondary | ICD-10-CM | POA: Diagnosis not present

## 2017-02-18 DIAGNOSIS — R001 Bradycardia, unspecified: Secondary | ICD-10-CM | POA: Diagnosis not present

## 2017-02-18 DIAGNOSIS — K219 Gastro-esophageal reflux disease without esophagitis: Secondary | ICD-10-CM | POA: Diagnosis not present

## 2017-02-18 DIAGNOSIS — Z419 Encounter for procedure for purposes other than remedying health state, unspecified: Secondary | ICD-10-CM

## 2017-02-18 HISTORY — PX: LUMBAR LAMINECTOMY/DECOMPRESSION MICRODISCECTOMY: SHX5026

## 2017-02-18 LAB — COMPREHENSIVE METABOLIC PANEL
ALT: 16 U/L (ref 14–54)
AST: 20 U/L (ref 15–41)
Albumin: 3.5 g/dL (ref 3.5–5.0)
Alkaline Phosphatase: 63 U/L (ref 38–126)
Anion gap: 9 (ref 5–15)
BUN: 18 mg/dL (ref 6–20)
CO2: 25 mmol/L (ref 22–32)
Calcium: 9.2 mg/dL (ref 8.9–10.3)
Chloride: 98 mmol/L — ABNORMAL LOW (ref 101–111)
Creatinine, Ser: 1.1 mg/dL — ABNORMAL HIGH (ref 0.44–1.00)
GFR calc Af Amer: 58 mL/min — ABNORMAL LOW (ref >60)
GFR calc non Af Amer: 50 mL/min — ABNORMAL LOW (ref >60)
Glucose, Bld: 106 mg/dL — ABNORMAL HIGH (ref 65–99)
Potassium: 4.2 mmol/L (ref 3.5–5.1)
Sodium: 132 mmol/L — ABNORMAL LOW (ref 135–145)
Total Bilirubin: 0.5 mg/dL (ref 0.3–1.2)
Total Protein: 6.7 g/dL (ref 6.5–8.1)

## 2017-02-18 LAB — GLUCOSE, CAPILLARY
Glucose-Capillary: 132 mg/dL — ABNORMAL HIGH (ref 65–99)
Glucose-Capillary: 106 mg/dL — ABNORMAL HIGH (ref 65–99)
Glucose-Capillary: 109 mg/dL — ABNORMAL HIGH (ref 65–99)
Glucose-Capillary: 124 mg/dL — ABNORMAL HIGH (ref 65–99)
Glucose-Capillary: 135 mg/dL — ABNORMAL HIGH (ref 65–99)

## 2017-02-18 LAB — CBC
HCT: 35 % — ABNORMAL LOW (ref 36.0–46.0)
Hemoglobin: 11.5 g/dL — ABNORMAL LOW (ref 12.0–15.0)
MCH: 31.9 pg (ref 26.0–34.0)
MCHC: 32.9 g/dL (ref 30.0–36.0)
MCV: 97.2 fL (ref 78.0–100.0)
Platelets: 235 10*3/uL (ref 150–400)
RBC: 3.6 MIL/uL — ABNORMAL LOW (ref 3.87–5.11)
RDW: 15.5 % (ref 11.5–15.5)
WBC: 7.2 10*3/uL (ref 4.0–10.5)

## 2017-02-18 SURGERY — LUMBAR LAMINECTOMY/DECOMPRESSION MICRODISCECTOMY
Anesthesia: General

## 2017-02-18 MED ORDER — LACTATED RINGERS IV SOLN: INTRAVENOUS | Status: DC

## 2017-02-18 MED ORDER — METOCLOPRAMIDE HCL 5 MG PO TABS: 5.0000 mg | ORAL_TABLET | Freq: Three times a day (TID) | ORAL | Status: DC | PRN

## 2017-02-18 MED ORDER — OXYCODONE HCL 5 MG PO TABS
ORAL_TABLET | ORAL | Status: AC
  Administered 2017-02-18: 10 mg via ORAL
  Filled 2017-02-18: qty 2

## 2017-02-18 MED ORDER — BISOPROLOL-HYDROCHLOROTHIAZIDE 5-6.25 MG PO TABS
1.0000 | ORAL_TABLET | Freq: Two times a day (BID) | ORAL | Status: DC
  Administered 2017-02-18 – 2017-02-19 (×2): 1 via ORAL
  Filled 2017-02-18 (×2): qty 1

## 2017-02-18 MED ORDER — ACETAMINOPHEN 650 MG RE SUPP: 650.0000 mg | Freq: Four times a day (QID) | RECTAL | Status: DC | PRN

## 2017-02-18 MED ORDER — FENTANYL CITRATE (PF) 100 MCG/2ML IJ SOLN
25.0000 ug | INTRAMUSCULAR | Status: DC | PRN
  Administered 2017-02-18 (×2): 50 ug via INTRAVENOUS

## 2017-02-18 MED ORDER — OXYCODONE HCL 5 MG PO TABS: 5.0000 mg | ORAL_TABLET | Freq: Once | ORAL | Status: DC | PRN

## 2017-02-18 MED ORDER — NORTRIPTYLINE HCL 10 MG PO CAPS
40.0000 mg | ORAL_CAPSULE | Freq: Every day | ORAL | Status: DC
  Administered 2017-02-18: 40 mg via ORAL
  Filled 2017-02-18: qty 4

## 2017-02-18 MED ORDER — CEFAZOLIN SODIUM-DEXTROSE 2-4 GM/100ML-% IV SOLN
2.0000 g | INTRAVENOUS | Status: AC
  Administered 2017-02-18: 2 g via INTRAVENOUS
  Filled 2017-02-18: qty 100

## 2017-02-18 MED ORDER — ONDANSETRON HCL 4 MG PO TABS: 4.0000 mg | ORAL_TABLET | Freq: Four times a day (QID) | ORAL | Status: DC | PRN

## 2017-02-18 MED ORDER — 0.9 % SODIUM CHLORIDE (POUR BTL) OPTIME
TOPICAL | Status: DC | PRN
  Administered 2017-02-18: 1000 mL

## 2017-02-18 MED ORDER — ONDANSETRON HCL 4 MG/2ML IJ SOLN: 4.0000 mg | Freq: Once | INTRAMUSCULAR | Status: DC | PRN

## 2017-02-18 MED ORDER — GABAPENTIN 300 MG PO CAPS
900.0000 mg | ORAL_CAPSULE | Freq: Three times a day (TID) | ORAL | Status: DC
  Administered 2017-02-18 – 2017-02-19 (×3): 900 mg via ORAL
  Filled 2017-02-18 (×3): qty 3

## 2017-02-18 MED ORDER — SODIUM CHLORIDE 0.9 % IV SOLN: INTRAVENOUS | Status: DC

## 2017-02-18 MED ORDER — MUPIROCIN 2 % EX OINT
TOPICAL_OINTMENT | CUTANEOUS | Status: AC
  Filled 2017-02-18: qty 22

## 2017-02-18 MED ORDER — FENTANYL CITRATE (PF) 250 MCG/5ML IJ SOLN
INTRAMUSCULAR | Status: AC
  Filled 2017-02-18: qty 5

## 2017-02-18 MED ORDER — ROCURONIUM BROMIDE 100 MG/10ML IV SOLN
INTRAVENOUS | Status: DC | PRN
  Administered 2017-02-18: 10 mg via INTRAVENOUS
  Administered 2017-02-18: 50 mg via INTRAVENOUS

## 2017-02-18 MED ORDER — OXYCODONE HCL 5 MG PO TABS
5.0000 mg | ORAL_TABLET | ORAL | Status: DC | PRN
  Administered 2017-02-18 – 2017-02-19 (×4): 10 mg via ORAL
  Filled 2017-02-18 (×3): qty 2

## 2017-02-18 MED ORDER — BUPIVACAINE HCL (PF) 0.25 % IJ SOLN
INTRAMUSCULAR | Status: AC
  Filled 2017-02-18: qty 30

## 2017-02-18 MED ORDER — SUGAMMADEX SODIUM 200 MG/2ML IV SOLN
INTRAVENOUS | Status: DC | PRN
  Administered 2017-02-18: 172.4 mg via INTRAVENOUS

## 2017-02-18 MED ORDER — POLYETHYLENE GLYCOL 3350 17 G PO PACK: 17.0000 g | PACK | Freq: Every day | ORAL | Status: DC | PRN

## 2017-02-18 MED ORDER — METHOCARBAMOL 500 MG PO TABS
500.0000 mg | ORAL_TABLET | Freq: Four times a day (QID) | ORAL | Status: DC | PRN
  Administered 2017-02-18 – 2017-02-19 (×3): 500 mg via ORAL
  Filled 2017-02-18 (×2): qty 1

## 2017-02-18 MED ORDER — GLIPIZIDE 5 MG PO TABS
5.0000 mg | ORAL_TABLET | Freq: Two times a day (BID) | ORAL | Status: DC
  Administered 2017-02-19: 5 mg via ORAL
  Filled 2017-02-18 (×2): qty 1

## 2017-02-18 MED ORDER — CEFAZOLIN SODIUM-DEXTROSE 1-4 GM/50ML-% IV SOLN
1.0000 g | Freq: Four times a day (QID) | INTRAVENOUS | Status: AC
  Administered 2017-02-18: 1 g via INTRAVENOUS
  Filled 2017-02-18: qty 50

## 2017-02-18 MED ORDER — LACTATED RINGERS IV SOLN
INTRAVENOUS | Status: DC | PRN
  Administered 2017-02-18 (×2): via INTRAVENOUS

## 2017-02-18 MED ORDER — SERTRALINE HCL 50 MG PO TABS
50.0000 mg | ORAL_TABLET | Freq: Every day | ORAL | Status: DC
  Administered 2017-02-18: 50 mg via ORAL
  Filled 2017-02-18: qty 1

## 2017-02-18 MED ORDER — CHLORHEXIDINE GLUCONATE 4 % EX LIQD: 60.0000 mL | Freq: Once | CUTANEOUS | Status: DC

## 2017-02-18 MED ORDER — OXYCODONE HCL 5 MG/5ML PO SOLN: 5.0000 mg | Freq: Once | ORAL | Status: DC | PRN

## 2017-02-18 MED ORDER — ACETAMINOPHEN 325 MG PO TABS: 650.0000 mg | ORAL_TABLET | Freq: Four times a day (QID) | ORAL | Status: DC | PRN

## 2017-02-18 MED ORDER — LISINOPRIL 20 MG PO TABS
20.0000 mg | ORAL_TABLET | Freq: Every day | ORAL | Status: DC
  Administered 2017-02-18: 20 mg via ORAL
  Filled 2017-02-18: qty 1

## 2017-02-18 MED ORDER — METHOCARBAMOL 500 MG PO TABS
ORAL_TABLET | ORAL | Status: AC
  Administered 2017-02-18: 500 mg via ORAL
  Filled 2017-02-18: qty 1

## 2017-02-18 MED ORDER — FENTANYL CITRATE (PF) 100 MCG/2ML IJ SOLN
INTRAMUSCULAR | Status: AC
  Administered 2017-02-18: 50 ug via INTRAVENOUS
  Filled 2017-02-18: qty 2

## 2017-02-18 MED ORDER — ONDANSETRON HCL 4 MG/2ML IJ SOLN
INTRAMUSCULAR | Status: DC | PRN
  Administered 2017-02-18: 4 mg via INTRAVENOUS

## 2017-02-18 MED ORDER — ONDANSETRON HCL 4 MG/2ML IJ SOLN: 4.0000 mg | Freq: Four times a day (QID) | INTRAMUSCULAR | Status: DC | PRN

## 2017-02-18 MED ORDER — LIDOCAINE HCL (CARDIAC) 20 MG/ML IV SOLN
INTRAVENOUS | Status: DC | PRN
  Administered 2017-02-18: 30 mg via INTRAVENOUS

## 2017-02-18 MED ORDER — PROPOFOL 10 MG/ML IV BOLUS
INTRAVENOUS | Status: DC | PRN
Start: 2017-02-18 — End: 2017-02-18
  Administered 2017-02-18: 150 mg via INTRAVENOUS

## 2017-02-18 MED ORDER — HYDROMORPHONE HCL 1 MG/ML IJ SOLN
0.5000 mg | INTRAMUSCULAR | Status: DC | PRN
  Administered 2017-02-18: 0.5 mg via INTRAVENOUS
  Filled 2017-02-18: qty 0.5

## 2017-02-18 MED ORDER — METOCLOPRAMIDE HCL 5 MG/ML IJ SOLN: 5.0000 mg | Freq: Three times a day (TID) | INTRAMUSCULAR | Status: DC | PRN

## 2017-02-18 MED ORDER — BUPIVACAINE HCL (PF) 0.25 % IJ SOLN
INTRAMUSCULAR | Status: DC | PRN
  Administered 2017-02-18: 10 mL

## 2017-02-18 MED ORDER — METFORMIN HCL 500 MG PO TABS
1000.0000 mg | ORAL_TABLET | Freq: Two times a day (BID) | ORAL | Status: DC
  Administered 2017-02-19: 1000 mg via ORAL
  Filled 2017-02-18: qty 2

## 2017-02-18 MED ORDER — FENTANYL CITRATE (PF) 100 MCG/2ML IJ SOLN
INTRAMUSCULAR | Status: DC | PRN
  Administered 2017-02-18 (×2): 50 ug via INTRAVENOUS
  Administered 2017-02-18: 100 ug via INTRAVENOUS

## 2017-02-18 MED ORDER — DOCUSATE SODIUM 100 MG PO CAPS
100.0000 mg | ORAL_CAPSULE | Freq: Two times a day (BID) | ORAL | Status: DC
  Administered 2017-02-18 – 2017-02-19 (×2): 100 mg via ORAL
  Filled 2017-02-18 (×2): qty 1

## 2017-02-18 MED ORDER — HEMOSTATIC AGENTS (NO CHARGE) OPTIME
TOPICAL | Status: DC | PRN
  Administered 2017-02-18: 1 via TOPICAL

## 2017-02-18 MED ORDER — INSULIN ASPART 100 UNIT/ML ~~LOC~~ SOLN: 0.0000 [IU] | Freq: Three times a day (TID) | SUBCUTANEOUS | Status: DC

## 2017-02-18 MED ORDER — METHOCARBAMOL 1000 MG/10ML IJ SOLN: 500.0000 mg | Freq: Four times a day (QID) | INTRAVENOUS | Status: DC | PRN

## 2017-02-18 SURGICAL SUPPLY — 49 items
ADH SKN CLS APL DERMABOND .7 (GAUZE/BANDAGES/DRESSINGS) ×1
BUR ROUND FLUTED 4 SOFT TCH (BURR) ×1 IMPLANT
BUR ROUND FLUTED 4MM SOFT TCH (BURR) ×1
CANISTER SUCT 3000ML PPV (MISCELLANEOUS) ×1 IMPLANT
CLOSURE STERI-STRIP 1/2X4 (GAUZE/BANDAGES/DRESSINGS) ×1
CLOSURE WOUND 1/2 X4 (GAUZE/BANDAGES/DRESSINGS) ×1
CLSR STERI-STRIP ANTIMIC 1/2X4 (GAUZE/BANDAGES/DRESSINGS) ×2 IMPLANT
COVER SURGICAL LIGHT HANDLE (MISCELLANEOUS) ×3 IMPLANT
DECANTER SPIKE VIAL GLASS SM (MISCELLANEOUS) ×1 IMPLANT
DERMABOND ADVANCED (GAUZE/BANDAGES/DRESSINGS) ×2
DERMABOND ADVANCED .7 DNX12 (GAUZE/BANDAGES/DRESSINGS) ×1 IMPLANT
DRAPE HALF SHEET 40X57 (DRAPES) ×10 IMPLANT
DRAPE MICROSCOPE LEICA (MISCELLANEOUS) ×3 IMPLANT
DRAPE SURG 17X23 STRL (DRAPES) ×3 IMPLANT
DRSG MEPILEX BORDER 4X4 (GAUZE/BANDAGES/DRESSINGS) ×3 IMPLANT
DURAPREP 26ML APPLICATOR (WOUND CARE) ×3 IMPLANT
ELECT REM PT RETURN 9FT ADLT (ELECTROSURGICAL) ×3
ELECTRODE REM PT RTRN 9FT ADLT (ELECTROSURGICAL) ×1 IMPLANT
GLOVE BIOGEL PI IND STRL 8 (GLOVE) ×2 IMPLANT
GLOVE BIOGEL PI INDICATOR 8 (GLOVE) ×4
GLOVE ORTHO TXT STRL SZ7.5 (GLOVE) ×6 IMPLANT
GOWN STRL REUS W/ TWL LRG LVL3 (GOWN DISPOSABLE) ×2 IMPLANT
GOWN STRL REUS W/ TWL XL LVL3 (GOWN DISPOSABLE) ×1 IMPLANT
GOWN STRL REUS W/TWL 2XL LVL3 (GOWN DISPOSABLE) ×3 IMPLANT
GOWN STRL REUS W/TWL LRG LVL3 (GOWN DISPOSABLE) ×6
GOWN STRL REUS W/TWL XL LVL3 (GOWN DISPOSABLE) ×3
KIT BASIN OR (CUSTOM PROCEDURE TRAY) ×3 IMPLANT
KIT ROOM TURNOVER OR (KITS) ×3 IMPLANT
MANIFOLD NEPTUNE II (INSTRUMENTS) ×1 IMPLANT
NDL HYPO 25GX1X1/2 BEV (NEEDLE) ×1 IMPLANT
NDL SPNL 18GX3.5 QUINCKE PK (NEEDLE) ×1 IMPLANT
NEEDLE HYPO 25GX1X1/2 BEV (NEEDLE) ×3 IMPLANT
NEEDLE SPNL 18GX3.5 QUINCKE PK (NEEDLE) ×3 IMPLANT
NS IRRIG 1000ML POUR BTL (IV SOLUTION) ×3 IMPLANT
PACK LAMINECTOMY ORTHO (CUSTOM PROCEDURE TRAY) ×3 IMPLANT
PAD ARMBOARD 7.5X6 YLW CONV (MISCELLANEOUS) ×6 IMPLANT
PATTIES SURGICAL .5 X.5 (GAUZE/BANDAGES/DRESSINGS) IMPLANT
PATTIES SURGICAL .75X.75 (GAUZE/BANDAGES/DRESSINGS) IMPLANT
STRIP CLOSURE SKIN 1/2X4 (GAUZE/BANDAGES/DRESSINGS) ×1 IMPLANT
SURGIFLO W/THROMBIN 8M KIT (HEMOSTASIS) ×2 IMPLANT
SUT VIC AB 0 CT1 27 (SUTURE) ×3
SUT VIC AB 0 CT1 27XBRD ANBCTR (SUTURE) IMPLANT
SUT VIC AB 1 CT1 27 (SUTURE) ×3
SUT VIC AB 1 CT1 27XBRD ANBCTR (SUTURE) ×1 IMPLANT
SUT VIC AB 2-0 CT1 27 (SUTURE) ×3
SUT VIC AB 2-0 CT1 TAPERPNT 27 (SUTURE) ×1 IMPLANT
SUT VIC AB 3-0 X1 27 (SUTURE) ×3 IMPLANT
TOWEL OR 17X24 6PK STRL BLUE (TOWEL DISPOSABLE) ×3 IMPLANT
TOWEL OR 17X26 10 PK STRL BLUE (TOWEL DISPOSABLE) ×3 IMPLANT

## 2017-02-18 NOTE — Interval H&P Note (Signed)
History and Physical Interval Note:  02/18/2017 12:34 PM  Melanie [name]  has presented today for surgery, with the diagnosis of Recurrent L3-4 Herniated Nucleus Pulposus  The various methods of treatment have been discussed with the patient and family. After consideration of risks, benefits and other options for treatment, the patient has consented to  Procedure(s): L3-4 MICRODISCECTOMY (N/A) as a surgical intervention .  The patient's history has been reviewed, patient examined, no change in status, stable for surgery.  I have reviewed the patient's chart and labs.  Questions were answered to the patient's satisfaction.     [name]

## 2017-02-18 NOTE — Anesthesia Procedure Notes (Signed)
Procedure Name: Intubation Date/Time: 02/18/2017 1:09 PM Performed by: Eligha Bridegroom Pre-anesthesia Checklist: Patient identified, Emergency Drugs available, Suction available, Patient being monitored and Timeout performed Patient Re-evaluated:Patient Re-evaluated prior to inductionOxygen Delivery Method: Circle system utilized Preoxygenation: Pre-oxygenation with 100% oxygen Intubation Type: IV induction Laryngoscope Size: Miller and 3 Grade View: Grade III Tube type: Oral Tube size: 7.0 mm Number of attempts: 3 Airway Equipment and Method: Bougie stylet Placement Confirmation: ETT inserted through vocal cords under direct vision,  positive ETCO2 and breath sounds checked- equal and bilateral Secured at: 21 cm Tube secured with: Tape Dental Injury: Teeth and Oropharynx as per pre-operative assessment  Difficulty Due To: Difficult Airway- due to anterior larynx and Difficult Airway- due to reduced neck mobility

## 2017-02-18 NOTE — Anesthesia Preprocedure Evaluation (Addendum)
Anesthesia Evaluation  Patient identified by MRN, date of birth, ID band Patient awake    Reviewed: Allergy & Precautions, NPO status , Patient's Chart, lab work & pertinent test results  Airway Mallampati: II  TM Distance: >3 FB Neck ROM: Full    Dental  (+) Teeth Intact, Dental Advisory Given   Pulmonary Current Smoker,    breath sounds clear to auscultation       Cardiovascular hypertension,  Rhythm:Regular Rate:Normal     Neuro/Psych    GI/Hepatic   Endo/Other  diabetes  Renal/GU      Musculoskeletal   Abdominal (+) + obese,   Peds  Hematology   Anesthesia Other Findings   Reproductive/Obstetrics                             Anesthesia Physical Anesthesia Plan  ASA: III  Anesthesia Plan: General   Post-op Pain Management:    Induction: Intravenous  PONV Risk Score and Plan: Ondansetron  Airway Management Planned: Oral ETT  Additional Equipment:   Intra-op Plan:   Post-operative Plan: Extubation in OR  Informed Consent: I have reviewed the patients History and Physical, chart, labs and discussed the procedure including the risks, benefits and alternatives for the proposed anesthesia with the patient or authorized representative who has indicated his/her understanding and acceptance.   Dental advisory given  Plan Discussed with: CRNA and Anesthesiologist  Anesthesia Plan Comments:        Anesthesia Quick Evaluation

## 2017-02-18 NOTE — Transfer of Care (Signed)
Immediate Anesthesia Transfer of Care Note  Patient: Melanie [name]  Procedure(s) Performed: Procedure(s): L3-4 MICRODISCECTOMY (N/A)  Patient Location: PACU  Anesthesia Type:General  Level of Consciousness: awake and alert   Airway & Oxygen Therapy: Patient Spontanous Breathing and Patient connected to nasal cannula oxygen  Post-op Assessment: Report given to RN and Post -op Vital signs reviewed and stable  Post vital signs: Reviewed and stable  Last Vitals:  Vitals:   02/18/17 1012  BP: (!) 155/71  Pulse: 69  Resp: 20  Temp: 36.4 C    Last Pain:  Vitals:   02/18/17 1109  PainSc: 5       Patients Stated Pain Goal: 3 (15/95/39 6728)  Complications: No apparent anesthesia complications

## 2017-02-18 NOTE — H&P (Signed)
Melanie [name] is an 71 y.o. female.   Chief Complaint: Low back pain and left greater than right lower extremity radiculopathy.   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 Status post L3 LAMINECTOMY, PARTIAL LAMINECTOMY WITH REMOVAL OF FREE FRAGMENT LEFT L3-4 11/28/2016. Progressively worsening of current symptoms with failed conservative treatment.  Past Medical History:  Diagnosis Date  . Arthritis   . Bradycardia   . Depression    under control  . DM (diabetes mellitus) ([place])    type 2  . Dyspnea    with some exertion  . Family history of anesthesia complication    mother-nausea and vomiting  . GERD (gastroesophageal reflux disease)    occasional  . Headache   . HTN (hypertension)   . Neuropathy    legs and feet  . PAF (paroxysmal atrial fibrillation) ([facility]) 10 years ago   no problems since, due to stress  . Situational stress    "was taking care of sick husband"    Past Surgical History:  Procedure Laterality Date  . ABDOMINAL HYSTERECTOMY  1980   partial  . APPENDECTOMY  1980  . BACK SURGERY  2004  . CARDIAC CATHETERIZATION     in the late 90's/early 2000's  . CARPAL TUNNEL RELEASE Bilateral 2003, 2004  . CHOLECYSTECTOMY N/A 12/28/2014   Procedure: LAPAROSCOPIC CHOLECYSTECTOMY WITH INTRAOPERATIVE CHOLANGIOGRAM;  Surgeon: Armandina Gemma, MD;  Location: [facility];  Service: General;  Laterality: N/A;  . COLONOSCOPY  8 years ago  . JOINT REPLACEMENT Bilateral 2007  . KNEE ARTHROSCOPY Bilateral 1990's  . LAMINECTOMY  11/28/2016   L 3   . LUMBAR LAMINECTOMY N/A 07/20/2013   Procedure: MICRODISCECTOMY LUMBAR LAMINECTOMY;  Surgeon: [name], MD;  Location: [facility];  Service: Orthopedics;  Laterality: N/A;  L2-3 Decompression  . LUMBAR LAMINECTOMY/DECOMPRESSION MICRODISCECTOMY N/A 11/28/2016   Procedure: L3 LAMINECTOMY, PARTIAL LAMINECTOMY WITH REMOVAL OF FREE FRAGMENT LEFT L3-4;  Surgeon: [name], MD;  Location: [facility];  Service: Orthopedics;  Laterality: N/A;    Family History  Problem Relation Age of Onset  . Heart attack Father        Deceased, 33  . Other Mother        Deceased, 25  . Neuropathy Mother   . Hypertension Mother   . Ovarian cancer Sister        Deceased, 4  . Neuropathy Sister   . Multiple sclerosis Sister   . Stroke Sister   . Hypertension Sister    Social History:  reports that she has been smoking Cigarettes.  She has a 15.00 pack-year smoking history. She has never used smokeless tobacco. She reports that she does not drink alcohol or use drugs.  Allergies:  Allergies  Allergen Reactions  . Fenofibrate Other (See Comments)    "muscle spasms"  . Metoprolol Other (See Comments)    Headache    Medications Prior to Admission  Medication Sig Dispense Refill  . aspirin EC 81 MG tablet Take 81 mg by mouth daily.     . bisoprolol-hydrochlorothiazide (ZIAC) 5-6.25 MG tablet TAKE ONE TABLET BY MOUTH TWICE DAILY 180 tablet 2  . calcium carbonate (TUMS - DOSED IN MG ELEMENTAL CALCIUM) 500 MG chewable tablet Chew 1 tablet by mouth 2 (two) times daily as needed for indigestion or heartburn.    . gabapentin (NEURONTIN) 300 MG capsule Take 3 capsules (900 mg total) by mouth 3 (  three) times daily. 270 capsule 11  . glipiZIDE (GLUCOTROL) 5 MG tablet Take 5 mg by mouth 2 (two) times daily before a meal.     . HYDROcodone-acetaminophen (NORCO/VICODIN) 5-325 MG tablet Take 1 tablet by mouth every 6 (six) hours as needed for moderate pain. 16 tablet 0  . ibuprofen (ADVIL,MOTRIN) 200 MG tablet Take 400 mg by mouth 2 (two) times daily as needed for moderate pain.     Marland Kitchen lisinopril (PRINIVIL,ZESTRIL) 20 MG tablet TAKE ONE TABLET BY MOUTH ONCE DAILY 90 tablet 3  . metFORMIN (GLUCOPHAGE) 1000 MG tablet Take 1,000 mg by mouth 2 (two) times daily.    . nortriptyline (PAMELOR) 10 MG capsule Take 4 capsules (40 mg total) by mouth at bedtime. 120 capsule 5  . sertraline  (ZOLOFT) 50 MG tablet Take 50 mg by mouth at bedtime.     . methocarbamol (ROBAXIN) 500 MG tablet Take 1 tablet (500 mg total) by mouth every 6 (six) hours as needed for muscle spasms. (Patient not taking: Reported on 01/20/2017) 40 tablet 1    Results for orders placed or performed during the hospital encounter of 02/18/17 (from the past 48 hour(s))  Glucose, capillary     Status: Abnormal   Collection Time: 02/18/17 10:06 AM  Result Value Ref Range   Glucose-Capillary 132 (H) 65 - 99 mg/dL   No results found.  Review of Systems  Constitutional: Negative.   HENT: Negative.   Respiratory: Negative.   Cardiovascular: Negative.   Gastrointestinal: Negative.   Genitourinary: Negative.   Musculoskeletal: Positive for back pain.  Skin: Negative.   Neurological: Positive for tingling and focal weakness.    Blood pressure (!) 155/71, pulse 69, temperature 97.6 F (36.4 C), resp. rate 20, height 5\' 7"  (1.702 m), weight 190 lb (86.2 kg), SpO2 100 %. Physical Exam  Constitutional: She is oriented to person, place, and time. She appears well-developed. No distress.  HENT:  Head: Normocephalic and atraumatic.  Eyes: EOM are normal. Pupils are equal, round, and reactive to light.  Neck: Normal range of motion.  Respiratory: No respiratory distress.  GI: She exhibits no distension.  Musculoskeletal:  Gait antalgic. Negative logroll bilateral hips. Pain with left straight leg raise. Bilateral calves are nontender. Neurovascularly intact. No focal motor deficits.   Neurological: She is alert and oriented to person, place, and time.  Skin: Skin is warm and dry.  Psychiatric: She has a normal mood and affect.     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 Surgical procedure along with possible recovery time discussed. Possible risks and complications also  discussed.   All questions answered.      Benjiman Core, PA-C 02/18/2017, 10:21 AM

## 2017-02-18 NOTE — Op Note (Signed)
Pre-op Diagnosis: Recurrent left L3-4 Herniated nucleus palposis  Postop diagnosis: Recurrent left L3-4 HNP  Procedure: Partial facetectomy left L3-4, microdiscectomy removal of recurrent left L3-4 HNP  Surgeon: Rodell Perna M.D.  Assistant: Benjiman Core PA-C medically necessary and present for the entire procedure  Anesthesia: Gen. plus Marcaine local  EBL: Per anesthetic record  Findings recurrent HNP left L3-4 with lateral recess stenosis.  Procedure: After induction general anesthesia preoperative Ancef patient was placed on chest rolls prone position careful padding and positioning yellow rolled pads underneath the axilla pads underneath the ulnar nerve. Lumbar spine was prepped with DuraPrep. Timeout procedure was performed while the prep was drying. There squared with towels Betadine Steri-Drape laminectomy sheet and drapes. Old incision and been marked with a skin marker before application of the Steri-Drape. Midline incision was made through the old incision. Patient had previous decompression surgery at L3-4 for spinal stenosis. She had a recurrent HNP on the left at L3-4 with radiculopathy and failed conservative treatment including pain medication muscle relaxants and epidural steroid. Dissection down to the inferior aspect of the lamina at L2 was performed. Facet was exposed and partial facetectomy was performed with a 4 mm bur removing the medial aspect of the facet. Following down the lateral gutter down to the base x-ray was taken initially with the [facility] #4 another x-ray with a partially down the lateral gutter and then finally with the [place] 4 inside the disc space confirming the appropriate level. Patient had already autofused L4-5 in the past with disc space collapse and it had surgery at that level on the distant past. Chunks of ligamentum the lateral gutter was removed. Bipolar cautery was used for hemostasis. Some surgical flow was used around the area with scar tissue  bleeding from the previous decompression surgery. A Freer elevator was used for degree coated with a small area of exposure. Passes were made through the disc space with micropituitary up and down removing chunks of ligament and pieces of the discs were teased with the ball-tip nerve hook and black nerve root and then removed. Continued dissection proximally where there was some extension laterally and cephalad to make sure all disc was removed. In the axilla of the nerve root small vein was grasped with the micropituitary along with the fragment of the disc that was remaining. There was concern this might be a neural element careful inspection showed that the dura was intact the nerve root above the left was intact. No CSF leak and 3 separate times including at the end of the case the CRNA increased the pressure to 35 cm with the dry epidural space. Passes were made in the disc space with the Epstein pushing fragment down press on with a micropituitary removal. Bone was removed at the level the pedicle below and the medial wall the pedicle was well visualized make sure no fragments noted extended out the pedicle . Dural separator hockey-stick was passed out the foramina. Undercutting underneath the facet joint to make sure that there was no spurs causing pressure on the nerve root above was performed. Nerve root was free copious irrigation. Epidural space was dry. Some surgical flow then placed. Dura was intact in standard layer closure with #1 Vicryl 2-0 Vicryl subtendinous tissue subarticular closure Dermabond on the skin postop dressing and transferred recovery room. An echogram she had intact ankle dorsiflexion plantar flexion relief of her preoperative leg pain intact quads and intact sensation the entire left lower extremity. His count needle count was correct.  Patient tolerated the procedure well.

## 2017-02-18 NOTE — Anesthesia Postprocedure Evaluation (Signed)
Anesthesia Post Note  Patient: Melanie [name]  Procedure(s) Performed: Procedure(s) (LRB): L3-4 MICRODISCECTOMY (N/A)     Patient location during evaluation: PACU Anesthesia Type: General Level of consciousness: awake, awake and alert and oriented Pain management: pain level controlled Vital Signs Assessment: post-procedure vital signs reviewed and stable Respiratory status: spontaneous breathing, nonlabored ventilation and respiratory function stable Cardiovascular status: blood pressure returned to baseline Anesthetic complications: no    Last Vitals:  Vitals:   02/18/17 1640 02/18/17 1718  BP: 136/71 (!) 156/83  Pulse: 71 74  Resp: 11 16  Temp: 36.3 C 36.9 C    Last Pain:  Vitals:   02/18/17 1745  PainSc: 8                  Malayzia Laforte COKER

## 2017-02-19 ENCOUNTER — Encounter (HOSPITAL_COMMUNITY): Payer: Self-pay | Admitting: Orthopaedic Surgery

## 2017-02-19 DIAGNOSIS — M5116 Intervertebral disc disorders with radiculopathy, lumbar region: Secondary | ICD-10-CM | POA: Diagnosis not present

## 2017-02-19 DIAGNOSIS — I10 Essential (primary) hypertension: Secondary | ICD-10-CM | POA: Diagnosis not present

## 2017-02-19 DIAGNOSIS — I480 Paroxysmal atrial fibrillation: Secondary | ICD-10-CM | POA: Diagnosis not present

## 2017-02-19 DIAGNOSIS — K219 Gastro-esophageal reflux disease without esophagitis: Secondary | ICD-10-CM | POA: Diagnosis not present

## 2017-02-19 DIAGNOSIS — F329 Major depressive disorder, single episode, unspecified: Secondary | ICD-10-CM | POA: Diagnosis not present

## 2017-02-19 DIAGNOSIS — E119 Type 2 diabetes mellitus without complications: Secondary | ICD-10-CM | POA: Diagnosis not present

## 2017-02-19 LAB — BASIC METABOLIC PANEL
Anion gap: 9 (ref 5–15)
BUN: 12 mg/dL (ref 6–20)
CO2: 29 mmol/L (ref 22–32)
Calcium: 9 mg/dL (ref 8.9–10.3)
Chloride: 94 mmol/L — ABNORMAL LOW (ref 101–111)
Creatinine, Ser: 1.1 mg/dL — ABNORMAL HIGH (ref 0.44–1.00)
GFR calc Af Amer: 58 mL/min — ABNORMAL LOW (ref >60)
GFR calc non Af Amer: 50 mL/min — ABNORMAL LOW (ref >60)
Glucose, Bld: 114 mg/dL — ABNORMAL HIGH (ref 65–99)
Potassium: 4.8 mmol/L (ref 3.5–5.1)
Sodium: 132 mmol/L — ABNORMAL LOW (ref 135–145)

## 2017-02-19 LAB — GLUCOSE, CAPILLARY: Glucose-Capillary: 100 mg/dL — ABNORMAL HIGH (ref 65–99)

## 2017-02-19 MED ORDER — OXYCODONE-ACETAMINOPHEN 5-325 MG PO TABS: 1.0000 | ORAL_TABLET | Freq: Four times a day (QID) | ORAL | 0 refills | Status: DC | PRN

## 2017-02-19 MED ORDER — METHOCARBAMOL 500 MG PO TABS: 500.0000 mg | ORAL_TABLET | Freq: Four times a day (QID) | ORAL | 1 refills | Status: DC | PRN

## 2017-02-19 NOTE — Discharge Summary (Signed)
Patient ID: LORAL CAMPI MRN: [id number] DOB/AGE: 01-24-1946 71 y.o.  Admit date: 02/18/2017 Discharge date: 02/19/2017  Admission Diagnoses:  Active Problems:   HNP (herniated nucleus pulposus), lumbar   Discharge Diagnoses:  Active Problems:   HNP (herniated nucleus pulposus), lumbar  status post Procedure(s): L3-4 MICRODISCECTOMY  Past Medical History:  Diagnosis Date  . Arthritis   . Bradycardia   . Depression    under control  . DM (diabetes mellitus) ([place])    type 2  . Dyspnea    with some exertion  . Family history of anesthesia complication    mother-nausea and vomiting  . GERD (gastroesophageal reflux disease)    occasional  . Headache   . HTN (hypertension)   . Neuropathy    legs and feet  . PAF (paroxysmal atrial fibrillation) ([facility]) 10 years ago   no problems since, due to stress  . Situational stress    "was taking care of sick husband"    Surgeries: Procedure(s): L3-4 MICRODISCECTOMY on 02/18/2017   Consultants:   Discharged Condition: Improved  Hospital Course: Melanie [name] is an 71 y.o. female who was admitted 02/18/2017 for operative treatment of lumbar HNP. Patient failed conservative treatments (please see the history and physical for the specifics) and had severe unremitting pain that affects sleep, daily activities and work/hobbies. After pre-op clearance, the patient was taken to the operating room on 02/18/2017 and underwent  Procedure(s): L3-4 MICRODISCECTOMY.    Patient was given perioperative antibiotics: Anti-infectives    Start     Dose/Rate Route Frequency Ordered Stop   02/18/17 1830  ceFAZolin (ANCEF) IVPB 1 g/50 mL premix     1 g 100 mL/hr over 30 Minutes Intravenous Every 6 hours 02/18/17 1719 02/18/17 1812   02/18/17 1215  ceFAZolin (ANCEF) IVPB 2g/100 mL premix     2 g 200 mL/hr over 30 Minutes Intravenous On call to O.R. 02/18/17 1037 02/18/17 1300       Patient was given sequential compression devices and early  ambulation to prevent DVT.   Patient benefited maximally from hospital stay and there were no complications. At the time of discharge, the patient was urinating/moving their bowels without difficulty, tolerating a regular diet, pain is controlled with oral pain medications and they have been cleared by PT/OT.   Recent vital signs: Patient Vitals for the past 24 hrs:  BP Temp Temp src Pulse Resp SpO2  02/19/17 0757 (!) 128/57 99.9 F (37.7 C) - 84 18 95 %  02/19/17 0400 120/63 97.9 F (36.6 C) Oral 74 18 95 %  02/18/17 2346 125/72 98.4 F (36.9 C) Oral 75 18 94 %  02/18/17 2000 136/68 98.2 F (36.8 C) Oral 78 18 94 %  02/18/17 1718 (!) 156/83 98.4 F (36.9 C) - 74 16 98 %  02/18/17 1640 136/71 97.3 F (36.3 C) - 71 11 96 %     Recent laboratory studies:  Recent Labs  02/18/17 1100 02/19/17 0412  WBC 7.2  --   HGB 11.5*  --   HCT 35.0*  --   PLT 235  --   NA 132* 132*  K 4.2 4.8  CL 98* 94*  CO2 25 29  BUN 18 12  CREATININE 1.10* 1.10*  GLUCOSE 106* 114*  CALCIUM 9.2 9.0     Discharge Medications:   Allergies as of 02/19/2017      Reactions   Fenofibrate Other (See Comments)   "muscle spasms"   Metoprolol Other (  See Comments)   Headache      Medication List    STOP taking these medications   HYDROcodone-acetaminophen 5-325 MG tablet Commonly known as:  NORCO/VICODIN   ibuprofen 200 MG tablet Commonly known as:  ADVIL,MOTRIN     TAKE these medications   aspirin EC 81 MG tablet Take 81 mg by mouth daily.   bisoprolol-hydrochlorothiazide 5-6.25 MG tablet Commonly known as:  ZIAC TAKE ONE TABLET BY MOUTH TWICE DAILY   calcium carbonate 500 MG chewable tablet Commonly known as:  TUMS - dosed in mg elemental calcium Chew 1 tablet by mouth 2 (two) times daily as needed for indigestion or heartburn.   gabapentin 300 MG capsule Commonly known as:  NEURONTIN Take 3 capsules (900 mg total) by mouth 3 (three) times daily.   glipiZIDE 5 MG tablet Commonly  known as:  GLUCOTROL Take 5 mg by mouth 2 (two) times daily before a meal.   lisinopril 20 MG tablet Commonly known as:  PRINIVIL,ZESTRIL TAKE ONE TABLET BY MOUTH ONCE DAILY   metFORMIN 1000 MG tablet Commonly known as:  GLUCOPHAGE Take 1,000 mg by mouth 2 (two) times daily.   methocarbamol 500 MG tablet Commonly known as:  ROBAXIN Take 1 tablet (500 mg total) by mouth every 6 (six) hours as needed for muscle spasms.   nortriptyline 10 MG capsule Commonly known as:  PAMELOR Take 4 capsules (40 mg total) by mouth at bedtime.   oxyCODONE-acetaminophen 5-325 MG tablet Commonly known as:  PERCOCET/ROXICET Take 1-2 tablets by mouth every 6 (six) hours as needed for severe pain.   sertraline 50 MG tablet Commonly known as:  ZOLOFT Take 50 mg by mouth at bedtime.       Diagnostic Studies: Dg Lumbar Spine 2-3 Views  Result Date: 02/18/2017 CLINICAL DATA:  Intraoperative localization for L3-4 microdiskectomy EXAM: LUMBAR SPINE - 2-3 VIEW COMPARISON:  Lumbar spine MRI 01/18/2017 FINDINGS: There is a probe tip at the level of the L3-L4 disc space. There is multilevel lumbar degenerative disease. IMPRESSION: Probe tip at L3-L4 interspace. Electronically Signed   By: Ulyses Jarred M.D.   On: 02/18/2017 14:05   Dg Lumbar Spine 1 View  Result Date: 02/18/2017 CLINICAL DATA:  Intraoperative localization of L3-4 for discectomy. EXAM: Operative LUMBAR SPINE - 1 VIEW 2:20 p.m.: COMPARISON:  Portable intraoperative lumbar spine x-rays performed earlier today, most recently 1:54 p.m. MRI lumbar spine 01/18/2017 which demonstrated 5 lumbar vertebrae. FINDINGS: The tip of the localization probe is in the L3-4 disc space. IMPRESSION: L3-4 localized intraoperatively. Electronically Signed   By: Evangeline Dakin M.D.   On: 02/18/2017 15:57   Xr C-arm No Report  Result Date: 01/29/2017 Please see Notes or Procedures tab for imaging impression.   Discharge Instructions    Call MD / Call 911    Complete  by:  As directed    If you experience chest pain or shortness of breath, CALL 911 and be transported to the hospital emergency room.  If you develope a fever above 101 F, pus (white drainage) or increased drainage or redness at the wound, or calf pain, call your surgeon's office.   Constipation Prevention    Complete by:  As directed    Drink plenty of fluids.  Prune juice may be helpful.  You may use a stool softener, such as Colace (over the counter) 100 mg twice a day.  Use MiraLax (over the counter) for constipation as needed.   Diet - low sodium heart healthy  Complete by:  As directed    Discharge instructions    Complete by:  As directed    Ok to shower 5 days postop.  Do not apply any creams or ointments to incision.  Do not remove steri-strips.  Can use 4x4 gauze and tape for dressing changes.  No aggressive activity.  No bending, squatting or prolonged sitting.  Mostly be in reclined position or lying down.   Driving restrictions    Complete by:  As directed    No driving until 6 weeks postop   Increase activity slowly as tolerated    Complete by:  As directed    Lifting restrictions    Complete by:  As directed    No lifting      Follow-up Information    [name], MD Follow up.   Specialty:  Orthopedic Surgery Contact information: [place] [address] [phone]           Discharge Plan:  discharge to home  Disposition:     Signed: Benjiman Core  02/19/2017, 4:38 PM

## 2017-02-19 NOTE — Progress Notes (Signed)
Patient alert and oriented, mae's well, voiding adequate amount of urine, swallowing without difficulty, no c/o pain at time of discharge. Patient discharged home with family. Script and discharged instructions given to patient. Patient and family stated understanding of instructions given. Patient has an appointment with Dr. Yates  

## 2017-02-23 NOTE — Addendum Note (Signed)
Addendum  created 02/23/17 0724 by Roberts Gaudy, MD   Anesthesia Staff edited

## 2017-02-27 ENCOUNTER — Ambulatory Visit (INDEPENDENT_AMBULATORY_CARE_PROVIDER_SITE_OTHER): Payer: Medicare Other | Admitting: Orthopaedic Surgery

## 2017-02-27 ENCOUNTER — Encounter (INDEPENDENT_AMBULATORY_CARE_PROVIDER_SITE_OTHER): Payer: Self-pay | Admitting: Orthopaedic Surgery

## 2017-02-27 VITALS — BP 114/64 | HR 73 | Ht 67.0 in | Wt 192.0 lb

## 2017-02-27 DIAGNOSIS — Z9889 Other specified postprocedural states: Secondary | ICD-10-CM

## 2017-02-27 MED ORDER — HYDROCODONE-ACETAMINOPHEN 7.5-325 MG PO TABS: 1.0000 | ORAL_TABLET | Freq: Four times a day (QID) | ORAL | 0 refills | Status: DC | PRN

## 2017-02-27 NOTE — Progress Notes (Signed)
Post-Op Visit Note   Patient: Melanie [name]           Date of Birth: 1946-03-05           MRN: [id number] Visit Date: 02/27/2017 PCP: Marton Redwood, MD   Assessment & Plan:  Chief Complaint:  Chief Complaint  Patient presents with  . Lower Back - Routine Post Op    Patient returns. 9 days status post L3-4 discectomy for recurrent H&P. Status at preoperative left leg pain is much better. Has some soreness in her back and into the bilateral groin area.She has been doing some walking. She is pleased at this point. Visit Diagnoses:  1. S/P lumbar discectomy     Plan: Patient will gradually increase walking distances over the next several weeks. Nothing too aggressive. Avoid bending, lifting, twisting. Given prescription for Norco 7.5/325. Discontinue Percocet. Follow-up in 4 weeks for recheck.  Follow-Up Instructions: Return in about 4 weeks (around 03/27/2017).   Orders:  No orders of the defined types were placed in this encounter.  Meds ordered this encounter  Medications  . HYDROcodone-acetaminophen (NORCO) 7.5-325 MG tablet    Sig: Take 1 tablet by mouth every 6 (six) hours as needed for moderate pain.    Dispense:  50 tablet    Refill:  0    Imaging: No results found.  PMFS History: Patient Active Problem List   Diagnosis Date Noted  . 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 HNP (herniated nucleus pulposus), lumbar   . Cholelithiasis with chronic cholecystitis 12/28/2014  . Cholelithiasis with cholecystitis 12/26/2014  . Unspecified vitamin D deficiency 09/27/2014  . Diabetic neuropathy ([place]) 10/06/2013  . B12 deficiency 09/27/2013  . Spinal stenosis of lumbar region 07/20/2013  . HTN (hypertension)   . Bradycardia   . DM (diabetes mellitus) ([place])   . PAF (paroxysmal atrial fibrillation) ([facility])    Past Medical History:  Diagnosis Date  . Arthritis   . Bradycardia   . Depression    under control  .  DM (diabetes mellitus) ([place])    type 2  . Dyspnea    with some exertion  . Family history of anesthesia complication    mother-nausea and vomiting  . GERD (gastroesophageal reflux disease)    occasional  . Headache   . HTN (hypertension)   . Neuropathy    legs and feet  . PAF (paroxysmal atrial fibrillation) ([facility]) 10 years ago   no problems since, due to stress  . Situational stress    "was taking care of sick husband"    Family History  Problem Relation Age of Onset  . Heart attack Father        Deceased, 24  . Other Mother        Deceased, 45  . Neuropathy Mother   . Hypertension Mother   . Ovarian cancer Sister        Deceased, 36  . Neuropathy Sister   . Multiple sclerosis Sister   . Stroke Sister   . Hypertension Sister     Past Surgical History:  Procedure Laterality Date  . ABDOMINAL HYSTERECTOMY  1980   partial  . APPENDECTOMY  1980  . BACK SURGERY  2004  . CARDIAC CATHETERIZATION     in the late 90's/early 2000's  . CARPAL TUNNEL RELEASE Bilateral 2003, 2004  . CHOLECYSTECTOMY N/A 12/28/2014   Procedure: LAPAROSCOPIC CHOLECYSTECTOMY WITH INTRAOPERATIVE CHOLANGIOGRAM;  Surgeon: Armandina Gemma, MD;  Location:  [facility];  Service: General;  Laterality: N/A;  . COLONOSCOPY  8 years ago  . JOINT REPLACEMENT Bilateral 2007  . KNEE ARTHROSCOPY Bilateral 1990's  . LAMINECTOMY  11/28/2016   L 3   . LUMBAR LAMINECTOMY N/A 07/20/2013   Procedure: MICRODISCECTOMY LUMBAR LAMINECTOMY;  Surgeon: [name], MD;  Location: [facility];  Service: Orthopedics;  Laterality: N/A;  L2-3 Decompression  . LUMBAR LAMINECTOMY/DECOMPRESSION MICRODISCECTOMY N/A 11/28/2016   Procedure: L3 LAMINECTOMY, PARTIAL LAMINECTOMY WITH REMOVAL OF FREE FRAGMENT LEFT L3-4;  Surgeon: [name], MD;  Location: [facility];  Service: Orthopedics;  Laterality: N/A;  . LUMBAR LAMINECTOMY/DECOMPRESSION MICRODISCECTOMY N/A 02/18/2017   Procedure: L3-4 MICRODISCECTOMY;  Surgeon: [name], MD;  Location: [facility];   Service: Orthopedics;  Laterality: N/A;   Social History   Occupational History  . Not on file.   Social History Main Topics  . Smoking status: Current Every Day Smoker    Packs/day: 0.50    Years: 30.00    Types: Cigarettes  . Smokeless tobacco: Never Used  . Alcohol use No  . Drug use: No  . Sexual activity: Not Currently   Exam Pleasant female alert and oriented in no acute distress. Surgical incision looks good. New Steri-Strips applied. No drainage or signs of infection. Negative logroll bilateral hips. Negative straight leg raise. Bilateral calves nontender. Neurovascularly intact. No focal motor deficits.

## 2017-03-05 ENCOUNTER — Telehealth (INDEPENDENT_AMBULATORY_CARE_PROVIDER_SITE_OTHER): Payer: Self-pay | Admitting: *Deleted

## 2017-03-05 NOTE — Telephone Encounter (Signed)
Pt called stating she is in the same pain she was in before surgery and is asking for a call back.

## 2017-03-06 ENCOUNTER — Telehealth (INDEPENDENT_AMBULATORY_CARE_PROVIDER_SITE_OTHER): Payer: Self-pay | Admitting: Orthopaedic Surgery

## 2017-03-06 DIAGNOSIS — M48061 Spinal stenosis, lumbar region without neurogenic claudication: Secondary | ICD-10-CM

## 2017-03-06 NOTE — Telephone Encounter (Signed)
Patient called asked if she can be referred to a neuro surgeon Erline Levine MD) Patient asked for a call back as soon as possible. The number to contact patient is [phone]

## 2017-03-09 ENCOUNTER — Telehealth (INDEPENDENT_AMBULATORY_CARE_PROVIDER_SITE_OTHER): Payer: Self-pay | Admitting: Orthopaedic Surgery

## 2017-03-09 MED ORDER — OXYCODONE-ACETAMINOPHEN 5-325 MG PO TABS: ORAL_TABLET | ORAL | 0 refills | Status: DC

## 2017-03-09 NOTE — Telephone Encounter (Signed)
see other notes

## 2017-03-09 NOTE — Telephone Encounter (Signed)
I called pt. Her children want her to see Dr. Vertell Limber. Was doing well post op with relief of leg pain then recurrence of pain just before last visit. She ran out of norco. Lobbyist. rx for # 40 percocet 5/325 written.  OK to see if Dr. Vertell Limber will see her.

## 2017-03-09 NOTE — Addendum Note (Signed)
Addended by: Meyer Cory on: 03/09/2017 12:00 PM   Modules accepted: Orders

## 2017-03-09 NOTE — Telephone Encounter (Signed)
Patient called in severe pain. Wanting a refill on Norco. And still wants to be referred to a Chief of Staff. CB # [phone]

## 2017-03-09 NOTE — Telephone Encounter (Signed)
rx up front for pick up 

## 2017-03-09 NOTE — Telephone Encounter (Signed)
Referral to Dr. Vertell Limber entered into system.

## 2017-03-09 NOTE — Telephone Encounter (Signed)
Please advise 

## 2017-03-09 NOTE — Addendum Note (Signed)
Addended by: Meyer Cory on: 03/09/2017 11:32 AM   Modules accepted: Orders

## 2017-03-09 NOTE — Telephone Encounter (Signed)
Ok for referral?

## 2017-03-09 NOTE — Telephone Encounter (Signed)
See other note Rx for percocet. OK for referral thanks

## 2017-03-16 ENCOUNTER — Telehealth (INDEPENDENT_AMBULATORY_CARE_PROVIDER_SITE_OTHER): Payer: Self-pay

## 2017-03-16 NOTE — Telephone Encounter (Signed)
Patient would like a Rx refill on Oxycodone.  CB# [phone].  Please Advise.

## 2017-03-16 NOTE — Telephone Encounter (Signed)
IC patient and advised Rx ready to pickup

## 2017-03-16 NOTE — Telephone Encounter (Signed)
Rx request 

## 2017-03-16 NOTE — Telephone Encounter (Signed)
Refill written. Put in EPIC , ucall she can have someone pick up thanks.  Rx under your computer.

## 2017-03-23 ENCOUNTER — Telehealth (INDEPENDENT_AMBULATORY_CARE_PROVIDER_SITE_OTHER): Payer: Self-pay | Admitting: *Deleted

## 2017-03-23 ENCOUNTER — Telehealth (INDEPENDENT_AMBULATORY_CARE_PROVIDER_SITE_OTHER): Payer: Self-pay | Admitting: Orthopaedic Surgery

## 2017-03-23 DIAGNOSIS — M545 Low back pain: Secondary | ICD-10-CM

## 2017-03-23 MED ORDER — OXYCODONE-ACETAMINOPHEN 5-325 MG PO TABS: ORAL_TABLET | ORAL | 0 refills | Status: DC

## 2017-03-23 NOTE — Telephone Encounter (Signed)
I called patient and advised. Script at front. MRI put in to be scheduled. See other message.

## 2017-03-23 NOTE — Addendum Note (Signed)
Addended by: Meyer Cory on: 03/23/2017 02:58 PM   Modules accepted: Orders

## 2017-03-23 NOTE — Telephone Encounter (Signed)
Patient called needing a refill on oxycodone. And wanted to know the progress on her referral to Dr. Vertell Limber. CB # [phone]

## 2017-03-23 NOTE — Telephone Encounter (Signed)
Pt called stating she will be out of her medication today. Just wanted me to let betsy know.

## 2017-03-23 NOTE — Addendum Note (Signed)
Addended by: Meyer Cory on: 03/23/2017 02:44 PM   Modules accepted: Orders

## 2017-03-23 NOTE — Telephone Encounter (Signed)
[place] for refill percocet. Order MRI scan lumbar with and without rule out recurrent HNP left L3-4.  Thanks ROV after scan. Thanks    " recurrent back and leg pain after L3-4 left microdiscectomy with recurrent pain and weakness "

## 2017-03-23 NOTE — Telephone Encounter (Signed)
Orders entered into system. Patient had relief x 2 weeks post op and had recurrence of leg pain and weakness. Repeat MRI to rule out recurrent HNP.  RX at front desk for pick up.

## 2017-03-23 NOTE — Telephone Encounter (Signed)
Rx entered into system.

## 2017-03-23 NOTE — Telephone Encounter (Signed)
Please advise on pain med. All information has been sent to Dr. Vertell Limber. Waiting to here in regards to appt.

## 2017-03-30 ENCOUNTER — Other Ambulatory Visit (INDEPENDENT_AMBULATORY_CARE_PROVIDER_SITE_OTHER): Payer: Self-pay | Admitting: Physician Assistant

## 2017-03-30 ENCOUNTER — Telehealth (INDEPENDENT_AMBULATORY_CARE_PROVIDER_SITE_OTHER): Payer: Self-pay | Admitting: Orthopaedic Surgery

## 2017-03-30 MED ORDER — OXYCODONE-ACETAMINOPHEN 5-325 MG PO TABS: ORAL_TABLET | ORAL | 0 refills | Status: DC

## 2017-03-30 NOTE — Telephone Encounter (Signed)
On your desk

## 2017-03-30 NOTE — Telephone Encounter (Signed)
Can you please advise since Dr. Lorin Mercy is in surgery today and not in the office?  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 She is scheduled for a repeat MRI on Thursday. Post op she had relief x 2 weeks and then her pain returned. Scan is to rule out another recurrent HNP.  Last script filled was Oxycodone 5/325 1-2 po q 4-6 hrs prn pain #40 on 03/23/17.  Thanks.

## 2017-03-30 NOTE — Telephone Encounter (Signed)
PT CALLED TO REMIND THAT SHE NEEDS HER RX FOR OXYCODONE TODAY, REQUESTED A CALL BACK.  [phone]

## 2017-03-30 NOTE — Telephone Encounter (Signed)
Script at front for patient to pick up. Patient advised.

## 2017-04-02 ENCOUNTER — Ambulatory Visit
Admission: RE | Admit: 2017-04-02 | Discharge: 2017-04-02 | Disposition: A | Discharge status: Home / Self Care | Payer: Medicare Other | Source: Ambulatory Visit | Attending: Orthopaedic Surgery | Admitting: Orthopaedic Surgery

## 2017-04-02 DIAGNOSIS — M5126 Other intervertebral disc displacement, lumbar region: Secondary | ICD-10-CM | POA: Diagnosis not present

## 2017-04-02 DIAGNOSIS — M48061 Spinal stenosis, lumbar region without neurogenic claudication: Secondary | ICD-10-CM | POA: Diagnosis not present

## 2017-04-02 DIAGNOSIS — M545 Low back pain: Secondary | ICD-10-CM

## 2017-04-02 MED ORDER — GADOBENATE DIMEGLUMINE 529 MG/ML IV SOLN
18.0000 mL | Freq: Once | INTRAVENOUS | Status: AC | PRN
  Administered 2017-04-02: 18 mL via INTRAVENOUS

## 2017-04-06 ENCOUNTER — Telehealth (INDEPENDENT_AMBULATORY_CARE_PROVIDER_SITE_OTHER): Payer: Self-pay | Admitting: *Deleted

## 2017-04-06 ENCOUNTER — Ambulatory Visit: Payer: Medicare Other | Admitting: Neurology

## 2017-04-06 MED ORDER — OXYCODONE-ACETAMINOPHEN 5-325 MG PO TABS: ORAL_TABLET | ORAL | 0 refills | Status: DC

## 2017-04-06 NOTE — Telephone Encounter (Signed)
Per Dr Lorin Mercy, ok to refill. I advised I would have someone in office fill since he is in surgery.

## 2017-04-06 NOTE — Telephone Encounter (Signed)
I called patient to advise Dr. Lorin Mercy is in surgery and that I will have to get approval from him for refill. She states that she has to have the medication today and that her son can only come on his lunch break.  I told her that I would call her back after I knew something .

## 2017-04-06 NOTE — Telephone Encounter (Signed)
Script printed. Dondra Prader, NP to sign. I called patient to advise script will be ready for pick up.

## 2017-04-06 NOTE — Telephone Encounter (Signed)
Patient called stating she picks her prescription on Monday's but she is unable to collect it today and she is sending her son Reve Crocket and he will be here on his lunch hour 12:30-1:00. Please advise [phone]

## 2017-04-06 NOTE — Addendum Note (Signed)
Addended by: Meyer Cory on: 04/06/2017 10:35 AM   Modules accepted: Orders

## 2017-04-07 ENCOUNTER — Ambulatory Visit (INDEPENDENT_AMBULATORY_CARE_PROVIDER_SITE_OTHER): Payer: Medicare Other | Admitting: Orthopaedic Surgery

## 2017-04-07 ENCOUNTER — Ambulatory Visit (INDEPENDENT_AMBULATORY_CARE_PROVIDER_SITE_OTHER): Payer: Medicare Other

## 2017-04-07 ENCOUNTER — Encounter (INDEPENDENT_AMBULATORY_CARE_PROVIDER_SITE_OTHER): Payer: Self-pay | Admitting: Orthopaedic Surgery

## 2017-04-07 VITALS — BP 105/65 | HR 78 | Ht 67.0 in | Wt 192.0 lb

## 2017-04-07 DIAGNOSIS — M545 Low back pain: Secondary | ICD-10-CM | POA: Diagnosis not present

## 2017-04-07 NOTE — Progress Notes (Signed)
Office Visit Note   Patient: Melanie [name]           Date of Birth: June 04, 1946           MRN: [id number] Visit Date: 04/07/2017              Requested by: Marton Redwood, MD [address] [place], [place] [address] PCP: Marton Redwood, MD   Assessment & Plan: Visit Diagnoses:  1. Acute bilateral low back pain, with sciatica presence unspecified     Plan: Patient is to moderate foraminal stenosis at L4-5 but this level has autofused and cannot progress. Previous stenosis at L3-4 is been decompressed and no evidence of disc recurrence on the left at the L3-4 level where she had the disc protrusion. She still has a foraminal component present. She is gradually been on sustained narcotic medicine now for several months we discussed that part of the problem with pain may be related to the narcotic medication that she is taking. We discussed options including L3-4 fusion may increase the load on the L2-3 disc space particularly since she is auto- fused at L4-5. She has some depression and anxiety her husband spent through some medication withdrawn the past when he been on pain medication. We discussed possibility of referral to a pain clinic if she is unsuccessful in medication wean. At this point with failure to respond to epidural injection lack of radicular symptoms she may get some significant improvement in her pain if she weans down off her narcotic medication. She voiced concern about potential for withdrawal but has never been through above noted that her husband had after he been prescribed medication for cancer. I gave her a copy of her MRI report and reviewed the images from previous last 3 MRI's for fibroid, May and also her most recent MRI last week. I'll recheck her in one month.  Follow-Up Instructions: Return in about 1 month (around 05/08/2017).   Orders:  Orders Placed This Encounter  Procedures  . XR Lumb Spine Flex&Ext Only   No orders of the defined types were placed in  this encounter.     Procedures: No procedures performed   Clinical Data: No additional findings.   Subjective: Chief Complaint  Patient presents with  . Lower Back - Routine Post Op, Follow-up    HPI patient returns with ongoing low back pain which seems to personal left than right. She states it feels like electricity. She states the pain never goes away. She gets somewhat better with sitting she states she has pain he will when she sits or is she supine sometime she can laminar signed pull her knees up to her chest which may help some. She states since the onset back in February she has had persistent pain problems. MRI scan is available for review from 04/02/2017. Patient had the previous surgery at L4-5 and autofused this level. Decompression L3-4 in February and then again surgery and June on the 66 disc herniation on the left at L3-4. I available shows none was recurrent disc but she does have the far lateral protrusion. Currently she is having back pain worse on the left but also has pain radiates down the right side as well with the pain rated as severe.   Review of Systems via systems updated since admission 02/18/2017 and is unchanged other than above. MRI is available for review.   Objective: Vital Signs: BP 105/65   Pulse 78   Ht 5\' 7"  (1.702 m)  Wt 192 lb (87.1 kg)   BMI 30.07 kg/m   Physical Exam  Constitutional: She is oriented to person, place, and time. She appears well-developed.  HENT:  Head: Normocephalic.  Right Ear: External ear normal.  Left Ear: External ear normal.  Eyes: Pupils are equal, round, and reactive to light.  Neck: No tracheal deviation present. No thyromegaly present.  Cardiovascular: Normal rate.   Pulmonary/Chest: Effort normal.  Abdominal: Soft.  Musculoskeletal:  Patient has a palpable posterior tibial pulses right and left foot slightly cool with good capillary refill. She has some discomfort with straight leg raising. Quads hip  flexors are strong. She is a mature with the cane which she uses in her right hand. Well-healed lumbar incision.  Neurological: She is alert and oriented to person, place, and time.  Skin: Skin is warm and dry.  Psychiatric: She has a normal mood and affect. Her behavior is normal.    Ortho Exam  Specialty Comments:  No specialty comments available.  Imaging: Xr Lumb Spine Flex&ext Only  Result Date: 04/07/2017 Lateral flexion extension lumbar x-rays obtained that shows fusion at L4-5 interbody which is an autofusion. No listhesis at L2-3 or L3-4. No instability at the operative level LIII-4. Impression: Fusion L4-5 no instability at the adjacent levels.  Contrast  Study Result   CLINICAL DATA:  Low back pain radiating to left leg.  Creatinine was obtained on site at [facility] at [address]  Results: Creatinine 1.1 mg/dL.  EXAM: MRI LUMBAR SPINE WITHOUT AND WITH CONTRAST  TECHNIQUE: Multiplanar and multiecho pulse sequences of the lumbar spine were obtained without and with intravenous contrast.  CONTRAST:  45mL MULTIHANCE GADOBENATE DIMEGLUMINE 529 MG/ML IV SOLN  COMPARISON:  Lumbar spine radiograph 02/18/2017  Lumbar spine MRI 01/18/2017  FINDINGS: Segmentation:  Normal  Alignment:  Physiologic.  Vertebrae: Status post bilateral laminectomies for posterior decompression at L2-L3 and L3-L4. No acute abnormality.  Conus medullaris: Extends to the upper L1 level and appears normal.  Paraspinal and other soft tissues: Left renal atrophy. Postsurgical changes in the posterior paraspinal soft tissues.  Disc levels:  T12-L1: Normal disc space and facets. No spinal canal or neuroforaminal stenosis.  L1-L2: Unchanged small disc bulge. No spinal canal stenosis or neural foraminal stenosis.  L2-L3: Unchanged right subarticular disc protrusion. No spinal canal or neural foraminal stenosis. Posterior decompression with wide patency  of the thecal sac.  L3-L4: Posterior decompression. Previously seen left subarticular disc protrusion has been resected or has involuted. Small disc bulge. Thecal sac is widely patent. Unchanged severe left neural foraminal stenosis secondary to foraminal component of disc protrusion superimposed on the disc bulge.  L4-L5: Severe narrowing of the disc space is unchanged. There is no spinal canal stenosis. Endplate spurring contributes to moderate right neural foraminal stenosis, which is unchanged.  L5-S1: Normal disc space and facets. No spinal canal or neuroforaminal stenosis.  Visualized sacrum: Normal.  No abnormal contrast enhancement.  IMPRESSION: 1. Previously seen left subarticular disc protrusion L3-L4 has involuted or has been resected. There remains severe left L3-L4 neural foraminal stenosis due to foraminal disc protrusion superimposed on a diffuse disc bulge. 2. Unchanged moderate right L4-L5 neural foraminal stenosis secondary to endplate spurring. 3. Posterior decompression at L2-L4 with wide patency of the thecal sac.   Electronically Signed   By: Ulyses Jarred M.D.   On: 04/03/2017 03:43      PMFS History: Patient Active Problem List   Diagnosis Date Noted  . Recurrent  herniation of lumbar disc 01/20/2017  . 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 HNP (herniated nucleus pulposus), lumbar   . Cholelithiasis with chronic cholecystitis 12/28/2014  . Cholelithiasis with cholecystitis 12/26/2014  . Unspecified vitamin D deficiency 09/27/2014  . Diabetic neuropathy ([facility]) 10/06/2013  . B12 deficiency 09/27/2013  . Spinal stenosis of lumbar region 07/20/2013  . HTN (hypertension)   . Bradycardia   . DM (diabetes mellitus) ([facility])   . PAF (paroxysmal atrial fibrillation) ([facility])    Past Medical History:  Diagnosis Date  . Arthritis   . Bradycardia   . Depression    under control  . DM (diabetes mellitus) ([facility])    type 2  . Dyspnea    with  some exertion  . Family history of anesthesia complication    mother-nausea and vomiting  . GERD (gastroesophageal reflux disease)    occasional  . Headache   . HTN (hypertension)   . Neuropathy    legs and feet  . PAF (paroxysmal atrial fibrillation) ([facility]) 10 years ago   no problems since, due to stress  . Situational stress    "was taking care of sick husband"    Family History  Problem Relation Age of Onset  . Heart attack Father        Deceased, 109  . Other Mother        Deceased, 80  . Neuropathy Mother   . Hypertension Mother   . Ovarian cancer Sister        Deceased, 62  . Neuropathy Sister   . Multiple sclerosis Sister   . Stroke Sister   . Hypertension Sister     Past Surgical History:  Procedure Laterality Date  . ABDOMINAL HYSTERECTOMY  1980   partial  . APPENDECTOMY  1980  . BACK SURGERY  2004  . CARDIAC CATHETERIZATION     in the late 90's/early 2000's  . CARPAL TUNNEL RELEASE Bilateral 2003, 2004  . CHOLECYSTECTOMY N/A 12/28/2014   Procedure: LAPAROSCOPIC CHOLECYSTECTOMY WITH INTRAOPERATIVE CHOLANGIOGRAM;  Surgeon: Armandina Gemma, MD;  Location: [facility];  Service: General;  Laterality: N/A;  . COLONOSCOPY  8 years ago  . JOINT REPLACEMENT Bilateral 2007  . KNEE ARTHROSCOPY Bilateral 1990's  . LAMINECTOMY  11/28/2016   L 3   . LUMBAR LAMINECTOMY N/A 07/20/2013   Procedure: MICRODISCECTOMY LUMBAR LAMINECTOMY;  Surgeon: [name], MD;  Location: [facility];  Service: Orthopedics;  Laterality: N/A;  L2-3 Decompression  . LUMBAR LAMINECTOMY/DECOMPRESSION MICRODISCECTOMY N/A 11/28/2016   Procedure: L3 LAMINECTOMY, PARTIAL LAMINECTOMY WITH REMOVAL OF FREE FRAGMENT LEFT L3-4;  Surgeon: [name], MD;  Location: [facility];  Service: Orthopedics;  Laterality: N/A;  . LUMBAR LAMINECTOMY/DECOMPRESSION MICRODISCECTOMY N/A 02/18/2017   Procedure: L3-4 MICRODISCECTOMY;  Surgeon: [name], MD;  Location: [facility];  Service: Orthopedics;  Laterality: N/A;   Social History    Occupational History  . Not on file.   Social History Main Topics  . Smoking status: Current Every Day Smoker    Packs/day: 0.50    Years: 30.00    Types: Cigarettes  . Smokeless tobacco: Never Used  . Alcohol use No  . Drug use: No  . Sexual activity: Not Currently

## 2017-04-10 ENCOUNTER — Telehealth: Payer: Self-pay | Admitting: Neurology

## 2017-04-10 NOTE — Telephone Encounter (Signed)
I spoke with patient and she said that Dr. Vertell Limber or any of his associates will see a patient who has had back surgery in the past year if it is not their patient.  Instructed her to call Dr. Lorin Mercy and ask for a referral to [facility] or [facility].  Patient agreed with plan.

## 2017-04-10 NOTE — Telephone Encounter (Signed)
Pt is asking if Dr. Posey Pronto can recommend a good Neurosurgeon in [place]. Please call back.

## 2017-04-13 ENCOUNTER — Telehealth (INDEPENDENT_AMBULATORY_CARE_PROVIDER_SITE_OTHER): Payer: Self-pay | Admitting: Orthopaedic Surgery

## 2017-04-13 DIAGNOSIS — M545 Low back pain: Secondary | ICD-10-CM

## 2017-04-13 MED ORDER — OXYCODONE-ACETAMINOPHEN 5-325 MG PO TABS: ORAL_TABLET | ORAL | 0 refills | Status: DC

## 2017-04-13 NOTE — Telephone Encounter (Signed)
Ok refill her pain med, make it one po bid prn and OK referral to [facility] neurosurgery thanks

## 2017-04-13 NOTE — Addendum Note (Signed)
Addended by: Meyer Cory on: 04/13/2017 02:24 PM   Modules accepted: Orders

## 2017-04-13 NOTE — Telephone Encounter (Signed)
PT REQUESTED A CALL BACK REGARDING  PAIN MEDS AND A REFERRAL FOR HER.  [phone]

## 2017-04-13 NOTE — Telephone Encounter (Signed)
I called patient and advised ok to pick up script tomorrow after being signed by Dr. Lorin Mercy. Also entered referral for [facility]. I advised patient.

## 2017-04-13 NOTE — Telephone Encounter (Signed)
Patient is down to 3 pills a day on her pain medication. She is trying to wean down. She thinks she should be down to 1 next Saturday.  She is requesting enough medication to get her through until then. She was taking 6 pills a day, and has now cut to 3.    She also requests referral to [facility] since the neurosurgeons here will not see her until she is one year out. She knows that you mentioned [facility], but she has a son that lives near [place] if that is an option.  Please advise.

## 2017-04-15 DIAGNOSIS — E1129 Type 2 diabetes mellitus with other diabetic kidney complication: Secondary | ICD-10-CM | POA: Diagnosis not present

## 2017-04-15 DIAGNOSIS — E871 Hypo-osmolality and hyponatremia: Secondary | ICD-10-CM | POA: Diagnosis not present

## 2017-04-15 DIAGNOSIS — I10 Essential (primary) hypertension: Secondary | ICD-10-CM | POA: Diagnosis not present

## 2017-04-15 DIAGNOSIS — E1149 Type 2 diabetes mellitus with other diabetic neurological complication: Secondary | ICD-10-CM | POA: Diagnosis not present

## 2017-04-15 DIAGNOSIS — M5416 Radiculopathy, lumbar region: Secondary | ICD-10-CM | POA: Diagnosis not present

## 2017-04-15 DIAGNOSIS — Z6832 Body mass index (BMI) 32.0-32.9, adult: Secondary | ICD-10-CM | POA: Diagnosis not present

## 2017-04-15 DIAGNOSIS — F3289 Other specified depressive episodes: Secondary | ICD-10-CM | POA: Diagnosis not present

## 2017-04-20 ENCOUNTER — Encounter: Payer: Self-pay | Admitting: Neurology

## 2017-04-20 ENCOUNTER — Ambulatory Visit (INDEPENDENT_AMBULATORY_CARE_PROVIDER_SITE_OTHER): Payer: Medicare Other | Admitting: Neurology

## 2017-04-20 VITALS — BP 120/64 | HR 77 | Ht 67.0 in | Wt 197.0 lb

## 2017-04-20 DIAGNOSIS — E0842 Diabetes mellitus due to underlying condition with diabetic polyneuropathy: Secondary | ICD-10-CM

## 2017-04-20 DIAGNOSIS — M792 Neuralgia and neuritis, unspecified: Secondary | ICD-10-CM | POA: Diagnosis not present

## 2017-04-20 MED ORDER — NORTRIPTYLINE HCL 10 MG PO CAPS: 40.0000 mg | ORAL_CAPSULE | Freq: Every day | ORAL | 3 refills | Status: DC

## 2017-04-20 MED ORDER — GABAPENTIN 300 MG PO CAPS: 900.0000 mg | ORAL_CAPSULE | Freq: Three times a day (TID) | ORAL | 3 refills | Status: DC

## 2017-04-20 NOTE — Patient Instructions (Signed)
Return to clinic in 1 year.

## 2017-04-20 NOTE — Progress Notes (Signed)
Follow-up Visit   Date: 04/20/17    Melanie [name] MRN: [id number] DOB: June 27, 1946   Interim History: Melanie [name] is a 71 y.o. right-handed Caucasian female with hypertension, bradycardia, diabetes mellitus, paroxysmal atrial fibrillation, GERD, and L2-L3 disc protrusion s/p decompression (07/2013) returning to the clinic for follow-up of diabetic neuropathy.   History of present illness: Starting in the Spring of 2014, she developed sharp radiating pain down her low back and legs and was found to have L2-3 disc protrusion. She underwent L2-L3 decompression in November 2014 which resolved her radiating back pain, but she has noticed numbness and tingling/stabbing pain over bilateral feet. When she severe, she feels as if there is "sand paper" on her feet. Symptoms are constant, worse in the evening. She was taking vicodin for her back pain which helped her feet symptoms. Prolonged standing and tight shoes exacerbates pain. Numbness and tingling is localized to her feet. Of note, she complains of numbness over the right lateral surface of the thigh which has been there since she was pregnant with her son.   In late 2014, neurontin was increased to 900mg  TID, and she noticed an improvement in her painful paresthesias where it was nearly resolved until March 2015. She has been getting B12 injections and reports an improvement in her energy level because she is less fatigued.  In late 2015, nortriptyline was added due to worsening paresthesia.  She did well on a combination of neurontin gabapentin 900mg  TID and nortriptyline 30mg  at bedtime.   UPDATE 10/06/2016:  She feels that her neuropathy is getting worse because her numbness is more profound, worse over the soles and toes but extends to her ankles.  She occasionally has sharp pain around her toes and ankles, but she is not bothered by this because it is not severe.  She is most bothered by numbness. She suffered one fall after  missing a step, but did not suffer any significant injuries.   UPDATE 04/20/2017:  She is here for 6 month follow-up visit for neuropathy which has been well-controlled on nortriptyline 40mg  and gabapentin 900mg  TID.  She has done excellent with controlling blood sugars and last HbA1c was 6.5.  Unfortunately, she has been having a lot of radicular low back pain and underwent decompression x 2 which provided transient relief only. She is using a cane for gait due to low back pain and is unable to stand for long periods of time.   Medications:  Current Outpatient Prescriptions on File Prior to Visit  Medication Sig Dispense Refill  . aspirin EC 81 MG tablet Take 81 mg by mouth daily.     . bisoprolol-hydrochlorothiazide (ZIAC) 5-6.25 MG tablet TAKE ONE TABLET BY MOUTH TWICE DAILY 180 tablet 2  . calcium carbonate (TUMS - DOSED IN MG ELEMENTAL CALCIUM) 500 MG chewable tablet Chew 1 tablet by mouth 2 (two) times daily as needed for indigestion or heartburn.    . gabapentin (NEURONTIN) 300 MG capsule Take 3 capsules (900 mg total) by mouth 3 (three) times daily. 270 capsule 11  . glipiZIDE (GLUCOTROL) 5 MG tablet Take 5 mg by mouth 2 (two) times daily before a meal.     . HYDROcodone-acetaminophen (NORCO) 7.5-325 MG tablet Take 1 tablet by mouth every 6 (six) hours as needed for moderate pain. 50 tablet 0  . lisinopril (PRINIVIL,ZESTRIL) 20 MG tablet TAKE ONE TABLET BY MOUTH ONCE DAILY 90 tablet 3  . metFORMIN (GLUCOPHAGE) 1000 MG tablet Take 1,000 mg by  mouth 2 (two) times daily.    . methocarbamol (ROBAXIN) 500 MG tablet Take 1 tablet (500 mg total) by mouth every 6 (six) hours as needed for muscle spasms. 40 tablet 1  . nortriptyline (PAMELOR) 10 MG capsule Take 4 capsules (40 mg total) by mouth at bedtime. 120 capsule 5  . oxyCODONE-acetaminophen (PERCOCET/ROXICET) 5-325 MG tablet Take one tablet BID as needed for pain. 40 tablet 0  . sertraline (ZOLOFT) 50 MG tablet Take 50 mg by mouth at bedtime.       No current facility-administered medications on file prior to visit.     Allergies:  Allergies  Allergen Reactions  . Fenofibrate Other (See Comments)    "muscle spasms"  . Metoprolol Other (See Comments)    Headache    Review of Systems:  CONSTITUTIONAL: No fevers, chills, night sweats, +weight loss.   EYES: No visual changes or eye pain ENT: No hearing changes.  No history of nose bleeds.   RESPIRATORY: No cough, wheezing and shortness of breath.   CARDIOVASCULAR: Negative for chest pain, and palpitations.   GI: Negative for abdominal discomfort, blood in stools or black stools.  No recent change in bowel habits.   GU:  No history of incontinence.   MUSCLOSKELETAL: No history of joint pain or swelling.  No myalgias.   SKIN: Negative for lesions, rash, and itching.   ENDOCRINE: Negative for cold or heat intolerance, polydipsia or goiter.   PSYCH:  +depression or anxiety symptoms.   NEURO: As Above.   Vital Signs:  BP 120/64   Pulse 77   Ht 5\' 7"  (1.702 m)   Wt 197 lb (89.4 kg)   SpO2 95%   BMI 30.85 kg/m    Neurological Exam: MENTAL STATUS including orientation to time, place, person, recent and remote memory, attention span and concentration, language, and fund of knowledge is normal.    CRANIAL NERVES:  Face is symmetric   MOTOR:  Motor strength is 5/5 in all extremities, including distally in the feet.    MSRs:  Reflexes are 2+/4 in the upper extremities and absent in the lower extremities.   SENSORY:  Vibration is diminished to 50% at her ankles, trace at the great toe bilaterally  COORDINATION/GAIT:  Gait slow, antalgic and assisted with cane.    Data: MRI lumbar spine 03/15/2013:  1. Lumbar spondylosis and degenerative disc disease cause moderate impingement at L2-3 and L4-5, and mild impingement at L3-4 as detailed above.  2. 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 I doubt that this is a pheochromocytoma given the intermediate T2 signal  characteristics, but otherwise this mass is nonspecific (although statistically most likely to represent an adenoma). If the patient has a history of lung cancer or if further workup is warranted, MRI of the adrenal glands with and without contrast provides the greatest diagnostic specificity.  3. Atrophic left kidney.  MRI lumbar spine 04/03/2017: 1. Previously seen left subarticular disc protrusion L3-L4 has involuted or has been resected. There remains severe left L3-L4 neural foraminal stenosis due to foraminal disc protrusion superimposed on a diffuse disc bulge. 2. Unchanged moderate right L4-L5 neural foraminal stenosis secondary to endplate spurring. 3. Posterior decompression at L2-L4 with wide patency of the thecal sac.  Lab Results  Component Value Date   [id number] 932 10/04/2015   Lab Results  Component Value Date   HGBA1C 6.5 (H) 11/27/2016    IMPRESSION/PLAN: 1.  Distal and symmetric peripheral neuropathy due to  diabetes.  Clinically, her neuropathy has been stable and well-controlled on nortriptyline 40mg  at bedtime and continue neurontin 900mg  TID.  Refills provided for 1 year. She is doing very well controlling her diabetes, last HbA1c 6.5  2.  Lumbar spondylosis with moderate impingement at L2-3 and L4-5 and recurrent left L3-4 HPN s/p decompression x 2 with severe left L3-4 foraminal stenosis due to disc protrusion followed by Dr. Lorin Mercy and seeking second opinion at [facility] due to refractory low back pain.   Return to clinic in 1 year  The duration of this appointment visit was 20 minutes of face-to-face time with the patient.  Greater than 50% of this time was spent in counseling, explanation of diagnosis, planning of further management, and coordination of care.   Thank you for allowing me to participate in patient's care.  If I can answer any additional questions, I would be pleased to do so.    Sincerely,    Kiaira Pointer K. Posey Pronto, DO

## 2017-04-22 ENCOUNTER — Telehealth (INDEPENDENT_AMBULATORY_CARE_PROVIDER_SITE_OTHER): Payer: Self-pay | Admitting: Orthopaedic Surgery

## 2017-04-22 NOTE — Telephone Encounter (Signed)
Patient called asking for a refill on oxycodone and also wanted to speak with you about her referral to [facility]. CB # [phone]

## 2017-04-23 NOTE — Telephone Encounter (Signed)
Patient wants to talk to you- she says she does not know how long she will have to wait to get to [facility] to be seen, she cannot handle the pain and if you can fix her then she wants you to do the surgery.  She is in just too much pain.  Please call her.  Referral info has been faxed to [facility], pending appt.

## 2017-04-24 ENCOUNTER — Other Ambulatory Visit (INDEPENDENT_AMBULATORY_CARE_PROVIDER_SITE_OTHER): Payer: Self-pay | Admitting: Radiology

## 2017-04-24 MED ORDER — OXYCODONE-ACETAMINOPHEN 5-325 MG PO TABS: ORAL_TABLET | ORAL | 0 refills | Status: DC

## 2017-04-24 NOTE — Telephone Encounter (Signed)
I called and discussed. Patient states she like to proceed with fusion at the L3-4 level for the continued left leg pain and recurrent foraminal disc protrusion with severe foraminal stenosis and retrolisthesis at the L3-4 level. Her family requests second opinion at [facility] and this can be done shortly before surgical schedule and we can proceed with this. She states that she wants to proceed with surgical scheduling due to continued left leg pain with the severe foraminal stenosis on the left at L3-4. Bluesheet done.

## 2017-04-29 NOTE — Telephone Encounter (Signed)
Melanie [name] called to advise that she does not wish to schedule surgery right now with Dr. Lorin Mercy.  She is going to see a spine specialist in [place] next week and would like to get his opinion before proceeding.  She will call if she would like to schedule with [place].

## 2017-04-29 NOTE — Telephone Encounter (Signed)
Ok thanks 

## 2017-05-07 ENCOUNTER — Telehealth (INDEPENDENT_AMBULATORY_CARE_PROVIDER_SITE_OTHER): Payer: Self-pay | Admitting: Orthopaedic Surgery

## 2017-05-07 MED ORDER — METHOCARBAMOL 500 MG PO TABS: 500.0000 mg | ORAL_TABLET | Freq: Three times a day (TID) | ORAL | 1 refills | Status: DC | PRN

## 2017-05-07 NOTE — Telephone Encounter (Signed)
Sent to pharmacy. I attempted to reach patient x 3 on home phone, rings busy. I left voicemail on mobile phone advising.

## 2017-05-07 NOTE — Addendum Note (Signed)
Addended by: Meyer Cory on: 05/07/2017 10:05 AM   Modules accepted: Orders

## 2017-05-07 NOTE — Telephone Encounter (Signed)
Ok refill robaxin thanks 500mg  po tid # 60. thanks

## 2017-05-07 NOTE — Telephone Encounter (Signed)
Patient called saying that since she has been weaning off her pain meds she has been experiencing severe muscle spasms and was wondering if Dr. Lorin Mercy could prescribed some muscle relaxers.  CB # [phone]

## 2017-05-07 NOTE — Telephone Encounter (Signed)
Please advise 

## 2017-05-08 DIAGNOSIS — M48061 Spinal stenosis, lumbar region without neurogenic claudication: Secondary | ICD-10-CM | POA: Diagnosis not present

## 2017-05-08 DIAGNOSIS — M5416 Radiculopathy, lumbar region: Secondary | ICD-10-CM | POA: Diagnosis not present

## 2017-05-08 DIAGNOSIS — Z01818 Encounter for other preprocedural examination: Secondary | ICD-10-CM | POA: Diagnosis not present

## 2017-05-08 DIAGNOSIS — Z01810 Encounter for preprocedural cardiovascular examination: Secondary | ICD-10-CM | POA: Diagnosis not present

## 2017-05-08 DIAGNOSIS — I771 Stricture of artery: Secondary | ICD-10-CM | POA: Diagnosis not present

## 2017-05-08 DIAGNOSIS — R9431 Abnormal electrocardiogram [ECG] [EKG]: Secondary | ICD-10-CM | POA: Diagnosis not present

## 2017-05-12 ENCOUNTER — Ambulatory Visit (INDEPENDENT_AMBULATORY_CARE_PROVIDER_SITE_OTHER): Payer: Medicare Other | Admitting: Orthopaedic Surgery

## 2017-05-15 ENCOUNTER — Other Ambulatory Visit (INDEPENDENT_AMBULATORY_CARE_PROVIDER_SITE_OTHER): Payer: Self-pay | Admitting: Family

## 2017-05-15 ENCOUNTER — Telehealth (INDEPENDENT_AMBULATORY_CARE_PROVIDER_SITE_OTHER): Payer: Self-pay | Admitting: Orthopaedic Surgery

## 2017-05-15 MED ORDER — OXYCODONE-ACETAMINOPHEN 5-325 MG PO TABS: ORAL_TABLET | ORAL | 0 refills | Status: DC

## 2017-05-15 NOTE — Telephone Encounter (Signed)
Patient called advised she is not scheduled to see the spine specialist until 05/26/17. Patient said the pain is so great she can barely stand it. Patient said she haven't slept in two days. Patient asked if she can get something for pain. Patient said her son Melanie [name] will pick up the Rx. The number to contact patient is [phone]

## 2017-05-15 NOTE — Telephone Encounter (Signed)
Please advise 

## 2017-05-26 DIAGNOSIS — Z9889 Other specified postprocedural states: Secondary | ICD-10-CM | POA: Diagnosis not present

## 2017-05-26 DIAGNOSIS — M47816 Spondylosis without myelopathy or radiculopathy, lumbar region: Secondary | ICD-10-CM | POA: Diagnosis not present

## 2017-05-26 DIAGNOSIS — M5135 Other intervertebral disc degeneration, thoracolumbar region: Secondary | ICD-10-CM | POA: Diagnosis not present

## 2017-05-26 DIAGNOSIS — M5416 Radiculopathy, lumbar region: Secondary | ICD-10-CM | POA: Diagnosis not present

## 2017-05-26 DIAGNOSIS — M545 Low back pain: Secondary | ICD-10-CM | POA: Diagnosis not present

## 2017-05-26 DIAGNOSIS — Z981 Arthrodesis status: Secondary | ICD-10-CM | POA: Diagnosis not present

## 2017-05-26 DIAGNOSIS — M48062 Spinal stenosis, lumbar region with neurogenic claudication: Secondary | ICD-10-CM | POA: Diagnosis not present

## 2017-05-26 DIAGNOSIS — M48061 Spinal stenosis, lumbar region without neurogenic claudication: Secondary | ICD-10-CM | POA: Diagnosis not present

## 2017-05-26 DIAGNOSIS — Z9181 History of falling: Secondary | ICD-10-CM | POA: Diagnosis not present

## 2017-05-26 DIAGNOSIS — F17200 Nicotine dependence, unspecified, uncomplicated: Secondary | ICD-10-CM | POA: Diagnosis present

## 2017-05-26 DIAGNOSIS — E119 Type 2 diabetes mellitus without complications: Secondary | ICD-10-CM | POA: Diagnosis present

## 2017-05-26 DIAGNOSIS — Z7984 Long term (current) use of oral hypoglycemic drugs: Secondary | ICD-10-CM | POA: Diagnosis not present

## 2017-06-04 ENCOUNTER — Emergency Department (HOSPITAL_COMMUNITY)
Admission: EM | Admit: 2017-06-04 | Discharge: 2017-06-05 | Disposition: A | Discharge status: Other Acute Inpatient Hospital | Payer: Medicare Other | Attending: Emergency Medicine | Admitting: Emergency Medicine

## 2017-06-04 ENCOUNTER — Encounter (HOSPITAL_COMMUNITY): Payer: Self-pay | Admitting: Emergency Medicine

## 2017-06-04 DIAGNOSIS — Z79899 Other long term (current) drug therapy: Secondary | ICD-10-CM | POA: Insufficient documentation

## 2017-06-04 DIAGNOSIS — G8918 Other acute postprocedural pain: Secondary | ICD-10-CM

## 2017-06-04 DIAGNOSIS — M5489 Other dorsalgia: Secondary | ICD-10-CM | POA: Diagnosis not present

## 2017-06-04 DIAGNOSIS — Z9889 Other specified postprocedural states: Secondary | ICD-10-CM | POA: Insufficient documentation

## 2017-06-04 DIAGNOSIS — Z96653 Presence of artificial knee joint, bilateral: Secondary | ICD-10-CM | POA: Diagnosis not present

## 2017-06-04 DIAGNOSIS — R202 Paresthesia of skin: Secondary | ICD-10-CM

## 2017-06-04 DIAGNOSIS — F17210 Nicotine dependence, cigarettes, uncomplicated: Secondary | ICD-10-CM | POA: Insufficient documentation

## 2017-06-04 DIAGNOSIS — I10 Essential (primary) hypertension: Secondary | ICD-10-CM | POA: Insufficient documentation

## 2017-06-04 DIAGNOSIS — R52 Pain, unspecified: Secondary | ICD-10-CM | POA: Diagnosis not present

## 2017-06-04 DIAGNOSIS — Z7984 Long term (current) use of oral hypoglycemic drugs: Secondary | ICD-10-CM | POA: Diagnosis not present

## 2017-06-04 DIAGNOSIS — Z7982 Long term (current) use of aspirin: Secondary | ICD-10-CM | POA: Insufficient documentation

## 2017-06-04 DIAGNOSIS — E119 Type 2 diabetes mellitus without complications: Secondary | ICD-10-CM | POA: Diagnosis not present

## 2017-06-04 MED ORDER — ONDANSETRON HCL 4 MG/2ML IJ SOLN
4.0000 mg | Freq: Once | INTRAMUSCULAR | Status: AC
  Administered 2017-06-05: 4 mg via INTRAVENOUS
  Filled 2017-06-04: qty 2

## 2017-06-04 MED ORDER — HYDROMORPHONE HCL 1 MG/ML IJ SOLN
1.0000 mg | Freq: Once | INTRAMUSCULAR | Status: AC
  Administered 2017-06-05: 1 mg via INTRAVENOUS
  Filled 2017-06-04: qty 1

## 2017-06-04 NOTE — ED Triage Notes (Signed)
Pt arrives via [facility] for back pain. Had lumbar surgery 1 week ago at [facility] pt states increased pain that started yesterday and has gotten progressively worse. Numbness in the r lower abdomen and groin. Denies incontinence

## 2017-06-05 DIAGNOSIS — Z7984 Long term (current) use of oral hypoglycemic drugs: Secondary | ICD-10-CM | POA: Diagnosis not present

## 2017-06-05 DIAGNOSIS — R109 Unspecified abdominal pain: Secondary | ICD-10-CM | POA: Diagnosis not present

## 2017-06-05 DIAGNOSIS — M5126 Other intervertebral disc displacement, lumbar region: Secondary | ICD-10-CM | POA: Diagnosis not present

## 2017-06-05 DIAGNOSIS — F17210 Nicotine dependence, cigarettes, uncomplicated: Secondary | ICD-10-CM | POA: Diagnosis not present

## 2017-06-05 DIAGNOSIS — I10 Essential (primary) hypertension: Secondary | ICD-10-CM | POA: Diagnosis not present

## 2017-06-05 DIAGNOSIS — G8918 Other acute postprocedural pain: Secondary | ICD-10-CM | POA: Diagnosis not present

## 2017-06-05 DIAGNOSIS — M545 Low back pain: Secondary | ICD-10-CM | POA: Diagnosis not present

## 2017-06-05 DIAGNOSIS — Z79899 Other long term (current) drug therapy: Secondary | ICD-10-CM | POA: Diagnosis not present

## 2017-06-05 DIAGNOSIS — Z4789 Encounter for other orthopedic aftercare: Secondary | ICD-10-CM | POA: Diagnosis not present

## 2017-06-05 DIAGNOSIS — M5136 Other intervertebral disc degeneration, lumbar region: Secondary | ICD-10-CM | POA: Diagnosis not present

## 2017-06-05 DIAGNOSIS — Z7982 Long term (current) use of aspirin: Secondary | ICD-10-CM | POA: Diagnosis not present

## 2017-06-05 DIAGNOSIS — I700 Atherosclerosis of aorta: Secondary | ICD-10-CM | POA: Diagnosis not present

## 2017-06-05 DIAGNOSIS — M47816 Spondylosis without myelopathy or radiculopathy, lumbar region: Secondary | ICD-10-CM | POA: Diagnosis not present

## 2017-06-05 DIAGNOSIS — Z9889 Other specified postprocedural states: Secondary | ICD-10-CM | POA: Diagnosis not present

## 2017-06-05 DIAGNOSIS — E119 Type 2 diabetes mellitus without complications: Secondary | ICD-10-CM | POA: Diagnosis not present

## 2017-06-05 DIAGNOSIS — R202 Paresthesia of skin: Secondary | ICD-10-CM | POA: Diagnosis not present

## 2017-06-05 DIAGNOSIS — K760 Fatty (change of) liver, not elsewhere classified: Secondary | ICD-10-CM | POA: Diagnosis not present

## 2017-06-05 DIAGNOSIS — Z96653 Presence of artificial knee joint, bilateral: Secondary | ICD-10-CM | POA: Diagnosis not present

## 2017-06-05 DIAGNOSIS — Z981 Arthrodesis status: Secondary | ICD-10-CM | POA: Diagnosis not present

## 2017-06-05 LAB — CBC WITH DIFFERENTIAL/PLATELET
Basophils Absolute: 0 10*3/uL (ref 0.0–0.1)
Basophils Relative: 0 %
Eosinophils Absolute: 0.2 10*3/uL (ref 0.0–0.7)
Eosinophils Relative: 3 %
HCT: 30.5 % — ABNORMAL LOW (ref 36.0–46.0)
Hemoglobin: 9.7 g/dL — ABNORMAL LOW (ref 12.0–15.0)
Lymphocytes Relative: 28 %
Lymphs Abs: 2.3 10*3/uL (ref 0.7–4.0)
MCH: 30.4 pg (ref 26.0–34.0)
MCHC: 31.8 g/dL (ref 30.0–36.0)
MCV: 95.6 fL (ref 78.0–100.0)
Monocytes Absolute: 0.7 10*3/uL (ref 0.1–1.0)
Monocytes Relative: 9 %
Neutro Abs: 4.8 10*3/uL (ref 1.7–7.7)
Neutrophils Relative %: 60 %
Platelets: 300 10*3/uL (ref 150–400)
RBC: 3.19 MIL/uL — ABNORMAL LOW (ref 3.87–5.11)
RDW: 14.7 % (ref 11.5–15.5)
WBC: 8 10*3/uL (ref 4.0–10.5)

## 2017-06-05 LAB — URINALYSIS, ROUTINE W REFLEX MICROSCOPIC
Bilirubin Urine: NEGATIVE
Glucose, UA: NEGATIVE mg/dL
Hgb urine dipstick: NEGATIVE
Ketones, ur: NEGATIVE mg/dL
Nitrite: NEGATIVE
Protein, ur: NEGATIVE mg/dL
Specific Gravity, Urine: 1.015 (ref 1.005–1.030)
pH: 5 (ref 5.0–8.0)

## 2017-06-05 LAB — BASIC METABOLIC PANEL
Anion gap: 9 (ref 5–15)
BUN: 14 mg/dL (ref 6–20)
CO2: 27 mmol/L (ref 22–32)
Calcium: 8.8 mg/dL — ABNORMAL LOW (ref 8.9–10.3)
Chloride: 98 mmol/L — ABNORMAL LOW (ref 101–111)
Creatinine, Ser: 0.86 mg/dL (ref 0.44–1.00)
GFR calc Af Amer: >60 mL/min (ref >60)
GFR calc non Af Amer: >60 mL/min (ref >60)
Glucose, Bld: 95 mg/dL (ref 65–99)
Potassium: 3.4 mmol/L — ABNORMAL LOW (ref 3.5–5.1)
Sodium: 134 mmol/L — ABNORMAL LOW (ref 135–145)

## 2017-06-05 MED ORDER — HYDROMORPHONE HCL 1 MG/ML IJ SOLN
0.5000 mg | Freq: Once | INTRAMUSCULAR | Status: AC
  Administered 2017-06-05: 0.5 mg via INTRAVENOUS
  Filled 2017-06-05: qty 1

## 2017-06-05 MED ORDER — FENTANYL CITRATE (PF) 100 MCG/2ML IJ SOLN
50.0000 ug | Freq: Once | INTRAMUSCULAR | Status: AC
  Administered 2017-06-05: 50 ug via INTRAVENOUS
  Filled 2017-06-05: qty 2

## 2017-06-05 MED ORDER — HYDROMORPHONE HCL 1 MG/ML IJ SOLN
1.0000 mg | Freq: Once | INTRAMUSCULAR | Status: AC
  Administered 2017-06-05: 1 mg via INTRAVENOUS
  Filled 2017-06-05: qty 1

## 2017-06-05 MED ORDER — TIZANIDINE HCL 4 MG PO TABS
4.0000 mg | ORAL_TABLET | Freq: Four times a day (QID) | ORAL | Status: DC | PRN
  Administered 2017-06-05: 4 mg via ORAL
  Filled 2017-06-05: qty 1

## 2017-06-05 NOTE — ED Notes (Signed)
[facility] called for transport, will be after 7 before arriving here to the ED.

## 2017-06-05 NOTE — ED Notes (Signed)
Per Lab, labs need to be redrawn.

## 2017-06-05 NOTE — ED Notes (Signed)
[facility] ([phone]) RN, Manuela Schwartz (ext [phone]), given report.  Would like to know when pt's last dose of pain medication is with [place].

## 2017-06-05 NOTE — ED Notes (Signed)
250ml post void residual

## 2017-06-05 NOTE — ED Provider Notes (Signed)
[facility] DEPT Provider Note   CSN: [id number] Arrival date & time: 06/04/17  2329     History   Chief Complaint Chief Complaint  Patient presents with  . Post-op Problem    HPI Melanie [name] is a 71 y.o. female.  Patient complains of worsening low back pain over the past week. She underwent L3/4 fusion by Dr. Jimmye Norman at [facility] on September 11. States she's been taking oxycodone at home for the pain but it is not helping. She was told to come to the hospitalfor immediate evaluation by her surgeon but EMS brought her here. She denies any new weakness. She does have new numbness in her right lower abdomen and groin. Denies any bowel or bladder incontinence. Denies any fever. She is able to ambulate. No vomiting. No chest pain or shortness of breath.She last took oxycodone about 6 hours ago.   The history is provided by the patient and the EMS personnel.    Past Medical History:  Diagnosis Date  . Arthritis   . Bradycardia   . Depression    under control  . DM (diabetes mellitus) ([place])    type 2  . Dyspnea    with some exertion  . Family history of anesthesia complication    mother-nausea and vomiting  . GERD (gastroesophageal reflux disease)    occasional  . Headache   . HTN (hypertension)   . Neuropathy    legs and feet  . PAF (paroxysmal atrial fibrillation) (HCC) 10 years ago   no problems since, due to stress  . Situational stress    "was taking care of sick husband"    Patient Active Problem List   Diagnosis Date Noted  . 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 HNP (herniated nucleus pulposus), lumbar   . Cholelithiasis with chronic cholecystitis 12/28/2014  . Cholelithiasis with cholecystitis 12/26/2014  . Unspecified vitamin D deficiency 09/27/2014  . Diabetic neuropathy ([place]) 10/06/2013  . B12 deficiency 09/27/2013  . Spinal stenosis of lumbar region 07/20/2013  . HTN  (hypertension)   . Bradycardia   . DM (diabetes mellitus) ([place])   . PAF (paroxysmal atrial fibrillation) ([place])     Past Surgical History:  Procedure Laterality Date  . ABDOMINAL HYSTERECTOMY  1980   partial  . APPENDECTOMY  1980  . BACK SURGERY  2004  . CARDIAC CATHETERIZATION     in the late 90's/early 2000's  . CARPAL TUNNEL RELEASE Bilateral 2003, 2004  . CHOLECYSTECTOMY N/A 12/28/2014   Procedure: LAPAROSCOPIC CHOLECYSTECTOMY WITH INTRAOPERATIVE CHOLANGIOGRAM;  Surgeon: Armandina Gemma, MD;  Location: [facility];  Service: General;  Laterality: N/A;  . COLONOSCOPY  8 years ago  . JOINT REPLACEMENT Bilateral 2007  . KNEE ARTHROSCOPY Bilateral 1990's  . LAMINECTOMY  11/28/2016   L 3   . LUMBAR LAMINECTOMY N/A 07/20/2013   Procedure: MICRODISCECTOMY LUMBAR LAMINECTOMY;  Surgeon: [name], MD;  Location: [facility];  Service: Orthopedics;  Laterality: N/A;  L2-3 Decompression  . LUMBAR LAMINECTOMY/DECOMPRESSION MICRODISCECTOMY N/A 11/28/2016   Procedure: L3 LAMINECTOMY, PARTIAL LAMINECTOMY WITH REMOVAL OF FREE FRAGMENT LEFT L3-4;  Surgeon: [name], MD;  Location: [facility];  Service: Orthopedics;  Laterality: N/A;  . LUMBAR LAMINECTOMY/DECOMPRESSION MICRODISCECTOMY N/A 02/18/2017   Procedure: L3-4 MICRODISCECTOMY;  Surgeon: [name], MD;  Location: [facility];  Service: Orthopedics;  Laterality: N/A;    OB History    No data available  Home Medications    Prior to Admission medications   Medication Sig Start Date End Date Taking? Authorizing Provider  aspirin EC 81 MG tablet Take 81 mg by mouth daily.     [provider]  bisoprolol-hydrochlorothiazide [facility]) 5-6.25 MG tablet TAKE ONE TABLET BY MOUTH TWICE DAILY 12/18/16   Nahser, Wonda Cheng, MD  calcium carbonate (TUMS - DOSED IN MG ELEMENTAL CALCIUM) 500 MG chewable tablet Chew 1 tablet by mouth 2 (two) times daily as needed for indigestion or heartburn.    [provider]  gabapentin (NEURONTIN) 300 MG capsule Take  3 capsules (900 mg total) by mouth 3 (three) times daily. 04/20/17   Patel, Donika K, DO  glipiZIDE (GLUCOTROL) 5 MG tablet Take 5 mg by mouth 2 (two) times daily before a meal.     [provider]  lisinopril (PRINIVIL,ZESTRIL) 20 MG tablet TAKE ONE TABLET BY MOUTH ONCE DAILY 10/20/16   Nahser, Wonda Cheng, MD  metFORMIN (GLUCOPHAGE) 1000 MG tablet Take 1,000 mg by mouth 2 (two) times daily.    [provider]  methocarbamol (ROBAXIN) 500 MG tablet Take 1 tablet (500 mg total) by mouth 3 (three) times daily as needed for muscle spasms. 05/07/17   [name], MD  nortriptyline (PAMELOR) 10 MG capsule Take 4 capsules (40 mg total) by mouth at bedtime. 04/20/17   Narda Amber K, DO  oxyCODONE-acetaminophen (PERCOCET/ROXICET) 5-325 MG tablet Take one tablet BID as needed for pain. 05/15/17   Suzan Slick, NP  sertraline (ZOLOFT) 50 MG tablet Take 50 mg by mouth at bedtime.     [provider]    Family History Family History  Problem Relation Age of Onset  . Heart attack Father        Deceased, 63  . Other Mother        Deceased, 92  . Neuropathy Mother   . Hypertension Mother   . Ovarian cancer Sister        Deceased, 63  . Neuropathy Sister   . Multiple sclerosis Sister   . Stroke Sister   . Hypertension Sister     Social History Social History  Substance Use Topics  . Smoking status: Current Every Day Smoker    Packs/day: 0.50    Years: 30.00    Types: Cigarettes  . Smokeless tobacco: Never Used  . Alcohol use No     Allergies   Fenofibrate and Metoprolol   Review of Systems Review of Systems   Physical Exam Updated Vital Signs BP 135/68   Pulse 82   Resp (!) 23   Ht 5\' 7"  (1.702 m)   Wt 86.2 kg (190 lb)   SpO2 97%   BMI 29.76 kg/m   Physical Exam  Constitutional: She is oriented to person, place, and time. She appears well-developed and well-nourished. She appears distressed.  uncomfortable  HENT:  Head: Normocephalic and  atraumatic.  Mouth/Throat: Oropharynx is clear and moist. No oropharyngeal exudate.  Eyes: Pupils are equal, round, and reactive to light. Conjunctivae and EOM are normal.  Neck: Normal range of motion. Neck supple.  No meningismus.  Cardiovascular: Normal rate, regular rhythm, normal heart sounds and intact distal pulses.   No murmur heard. Pulmonary/Chest: Effort normal and breath sounds normal. No respiratory distress.  Abdominal: Soft. There is no tenderness. There is no rebound and no guarding.  Subjective numbness to R groin  Musculoskeletal: Normal range of motion. She exhibits tenderness. She exhibits no edema.  Surgical  incisions to lumbar spine and R flank. Scattered ecchymosis. Normal rectal tone. Chaperone present  5/5 strength in bilateral lower extremities. Ankle plantar and dorsiflexion intact. Great toe extension intact bilaterally. +2 DP and PT pulses. Gait not tested.  Patellar reflexes not elicited bilaterally  Neurological: She is alert and oriented to person, place, and time. No cranial nerve deficit. She exhibits normal muscle tone. Coordination normal.   5/5 strength throughout. CN 2-12 intact.Equal grip strength.   Skin: Skin is warm. Capillary refill takes less than 2 seconds. No rash noted.  Psychiatric: She has a normal mood and affect. Her behavior is normal.  Nursing note and vitals reviewed.    ED Treatments / Results  Labs (all labs ordered are listed, but only abnormal results are displayed) Labs Reviewed  URINALYSIS, ROUTINE W REFLEX MICROSCOPIC - Abnormal; Notable for the following:       Result Value   APPearance HAZY (*)    Leukocytes, UA MODERATE (*)    Bacteria, UA RARE (*)    Squamous Epithelial / LPF 6-30 (*)    Non Squamous Epithelial 0-5 (*)    All other components within normal limits  BASIC METABOLIC PANEL - Abnormal; Notable for the following:    Sodium 134 (*)    Potassium 3.4 (*)    Chloride 98 (*)    Calcium 8.8 (*)    All other  components within normal limits  CBC WITH DIFFERENTIAL/PLATELET - Abnormal; Notable for the following:    RBC 3.19 (*)    Hemoglobin 9.7 (*)    HCT 30.5 (*)    All other components within normal limits  CBC WITH DIFFERENTIAL/PLATELET    EKG  EKG Interpretation None       Radiology No results found.  Procedures Procedures (including critical care time)  Medications Ordered in ED Medications  HYDROmorphone (DILAUDID) injection 1 mg (1 mg Intravenous Given 06/05/17 0028)  ondansetron (ZOFRAN) injection 4 mg (4 mg Intravenous Given 06/05/17 0028)     Initial Impression / Assessment and Plan / ED Course  I have reviewed the triage vital signs and the nursing notes.  Pertinent labs & imaging results that were available during my care of the patient were reviewed by me and considered in my medical decision making (see chart for details).     Patient with worsening lumbar back pain after back surgery 1 week ago at [facility]. Denies fever. Denies bowel or bladder incontinence. Denies any focal weakness. Does have new numbness in her right groin  L3-L4 anterior/post dec and fusion done by Dr. Jimmye Norman   245ml post void residual  Multiple calls to [facility] on call for Dr. Jimmye Norman not returned.   Difficulty getting into contact with [facility]. Transfer line cannot reach Dr. Wilhelmina Mcardle who is apparently on-call for Dr. Jimmye Norman.  D/w ED Dr. Flossie Buffy who states he cannot accept patient to the ED without accepting physician as there is no service that this hospital is lacking. Dr. Sherwood Gambler of neurosurgery feels patient should have care by her previous surgeon.  Dr. Flossie Buffy attempting to contact transfer line.  D/w Dr. Wilhelmina Mcardle who is on call for Dr. Jimmye Norman, but is not a spine surgeon. Dr. Wilhelmina Mcardle cannot accept patient without spine privileges.  Dr. Flossie Buffy will not accept patient to ED without consultant willing to see her.  Dr. Wilhelmina Mcardle feels patient can have MRI when she  arrives at [facility] as results will not change her treatment here and she will need to  be seen by her surgeon.   D/w Dr. Carlota Raspberry on call for [facility] but she does not cover Dr. Jimmye Norman patients. Transfer line will try to contact Dr. Jimmye Norman or Dr. Tina Griffiths.  Transfer line has contacted Dr. Jimmye Norman who has accepted patient. Family updated.  Final Clinical Impressions(s) / ED Diagnoses   Final diagnoses:  Post-operative pain  Paresthesia    New Prescriptions New Prescriptions   No medications on file     Ezequiel Essex, MD 06/05/17 [phone]

## 2017-06-06 DIAGNOSIS — Z7984 Long term (current) use of oral hypoglycemic drugs: Secondary | ICD-10-CM | POA: Diagnosis not present

## 2017-06-06 DIAGNOSIS — E119 Type 2 diabetes mellitus without complications: Secondary | ICD-10-CM | POA: Diagnosis not present

## 2017-06-06 DIAGNOSIS — I10 Essential (primary) hypertension: Secondary | ICD-10-CM | POA: Diagnosis not present

## 2017-06-06 DIAGNOSIS — M545 Low back pain: Secondary | ICD-10-CM | POA: Diagnosis not present

## 2017-06-06 DIAGNOSIS — Z79899 Other long term (current) drug therapy: Secondary | ICD-10-CM | POA: Diagnosis not present

## 2017-06-06 DIAGNOSIS — R109 Unspecified abdominal pain: Secondary | ICD-10-CM | POA: Diagnosis not present

## 2017-06-25 DIAGNOSIS — R109 Unspecified abdominal pain: Secondary | ICD-10-CM | POA: Diagnosis not present

## 2017-07-21 ENCOUNTER — Other Ambulatory Visit: Payer: Self-pay | Admitting: Internal Medicine

## 2017-07-21 DIAGNOSIS — Z1231 Encounter for screening mammogram for malignant neoplasm of breast: Secondary | ICD-10-CM

## 2017-07-29 DIAGNOSIS — H2513 Age-related nuclear cataract, bilateral: Secondary | ICD-10-CM | POA: Diagnosis not present

## 2017-07-29 DIAGNOSIS — H524 Presbyopia: Secondary | ICD-10-CM | POA: Diagnosis not present

## 2017-07-29 DIAGNOSIS — H5203 Hypermetropia, bilateral: Secondary | ICD-10-CM | POA: Diagnosis not present

## 2017-08-18 DIAGNOSIS — Z4789 Encounter for other orthopedic aftercare: Secondary | ICD-10-CM | POA: Diagnosis not present

## 2017-08-20 DIAGNOSIS — M545 Low back pain: Secondary | ICD-10-CM | POA: Diagnosis not present

## 2017-08-20 DIAGNOSIS — M6281 Muscle weakness (generalized): Secondary | ICD-10-CM | POA: Diagnosis not present

## 2017-08-20 DIAGNOSIS — Z4789 Encounter for other orthopedic aftercare: Secondary | ICD-10-CM | POA: Diagnosis not present

## 2017-08-24 ENCOUNTER — Ambulatory Visit: Payer: Medicare Other

## 2017-08-26 DIAGNOSIS — M545 Low back pain: Secondary | ICD-10-CM | POA: Diagnosis not present

## 2017-08-26 DIAGNOSIS — Z4789 Encounter for other orthopedic aftercare: Secondary | ICD-10-CM | POA: Diagnosis not present

## 2017-08-26 DIAGNOSIS — M6281 Muscle weakness (generalized): Secondary | ICD-10-CM | POA: Diagnosis not present

## 2017-08-28 DIAGNOSIS — M6281 Muscle weakness (generalized): Secondary | ICD-10-CM | POA: Diagnosis not present

## 2017-08-28 DIAGNOSIS — Z4789 Encounter for other orthopedic aftercare: Secondary | ICD-10-CM | POA: Diagnosis not present

## 2017-08-28 DIAGNOSIS — M545 Low back pain: Secondary | ICD-10-CM | POA: Diagnosis not present

## 2017-09-01 DIAGNOSIS — M545 Low back pain: Secondary | ICD-10-CM | POA: Diagnosis not present

## 2017-09-01 DIAGNOSIS — Z4789 Encounter for other orthopedic aftercare: Secondary | ICD-10-CM | POA: Diagnosis not present

## 2017-09-01 DIAGNOSIS — M6281 Muscle weakness (generalized): Secondary | ICD-10-CM | POA: Diagnosis not present

## 2017-09-04 DIAGNOSIS — M6281 Muscle weakness (generalized): Secondary | ICD-10-CM | POA: Diagnosis not present

## 2017-09-04 DIAGNOSIS — M545 Low back pain: Secondary | ICD-10-CM | POA: Diagnosis not present

## 2017-09-04 DIAGNOSIS — Z4789 Encounter for other orthopedic aftercare: Secondary | ICD-10-CM | POA: Diagnosis not present

## 2017-09-11 DIAGNOSIS — M545 Low back pain: Secondary | ICD-10-CM | POA: Diagnosis not present

## 2017-09-11 DIAGNOSIS — Z4789 Encounter for other orthopedic aftercare: Secondary | ICD-10-CM | POA: Diagnosis not present

## 2017-09-11 DIAGNOSIS — M6281 Muscle weakness (generalized): Secondary | ICD-10-CM | POA: Diagnosis not present

## 2017-09-14 DIAGNOSIS — M545 Low back pain: Secondary | ICD-10-CM | POA: Diagnosis not present

## 2017-09-14 DIAGNOSIS — Z4789 Encounter for other orthopedic aftercare: Secondary | ICD-10-CM | POA: Diagnosis not present

## 2017-09-14 DIAGNOSIS — M6281 Muscle weakness (generalized): Secondary | ICD-10-CM | POA: Diagnosis not present

## 2017-09-15 HISTORY — PX: BREAST EXCISIONAL BIOPSY: SUR124

## 2017-09-20 DIAGNOSIS — Z23 Encounter for immunization: Secondary | ICD-10-CM | POA: Diagnosis not present

## 2017-09-22 ENCOUNTER — Ambulatory Visit
Admission: RE | Admit: 2017-09-22 | Discharge: 2017-09-22 | Disposition: A | Discharge status: Home / Self Care | Payer: Medicare Other | Source: Ambulatory Visit | Attending: Internal Medicine | Admitting: Internal Medicine

## 2017-09-22 DIAGNOSIS — Z1231 Encounter for screening mammogram for malignant neoplasm of breast: Secondary | ICD-10-CM

## 2017-09-23 ENCOUNTER — Other Ambulatory Visit: Payer: Self-pay | Admitting: Internal Medicine

## 2017-09-23 DIAGNOSIS — R928 Other abnormal and inconclusive findings on diagnostic imaging of breast: Secondary | ICD-10-CM

## 2017-09-25 ENCOUNTER — Other Ambulatory Visit: Payer: Self-pay | Admitting: Internal Medicine

## 2017-09-25 ENCOUNTER — Ambulatory Visit
Admission: RE | Admit: 2017-09-25 | Discharge: 2017-09-25 | Disposition: A | Discharge status: Home / Self Care | Payer: Medicare Other | Source: Ambulatory Visit | Attending: Internal Medicine | Admitting: Internal Medicine

## 2017-09-25 DIAGNOSIS — R928 Other abnormal and inconclusive findings on diagnostic imaging of breast: Secondary | ICD-10-CM

## 2017-09-25 DIAGNOSIS — R922 Inconclusive mammogram: Secondary | ICD-10-CM | POA: Diagnosis not present

## 2017-09-25 DIAGNOSIS — N6489 Other specified disorders of breast: Secondary | ICD-10-CM | POA: Diagnosis not present

## 2017-09-25 DIAGNOSIS — N6310 Unspecified lump in the right breast, unspecified quadrant: Secondary | ICD-10-CM

## 2017-10-01 ENCOUNTER — Ambulatory Visit
Admission: RE | Admit: 2017-10-01 | Discharge: 2017-10-01 | Disposition: A | Discharge status: Home / Self Care | Payer: Medicare Other | Source: Ambulatory Visit | Attending: Internal Medicine | Admitting: Internal Medicine

## 2017-10-01 DIAGNOSIS — D0501 Lobular carcinoma in situ of right breast: Secondary | ICD-10-CM | POA: Diagnosis not present

## 2017-10-01 DIAGNOSIS — N6313 Unspecified lump in the right breast, lower outer quadrant: Secondary | ICD-10-CM | POA: Diagnosis not present

## 2017-10-01 DIAGNOSIS — N6310 Unspecified lump in the right breast, unspecified quadrant: Secondary | ICD-10-CM

## 2017-10-02 ENCOUNTER — Other Ambulatory Visit: Payer: Self-pay | Admitting: Cardiovascular Disease

## 2017-10-12 DIAGNOSIS — R82998 Other abnormal findings in urine: Secondary | ICD-10-CM | POA: Diagnosis not present

## 2017-10-12 DIAGNOSIS — E1129 Type 2 diabetes mellitus with other diabetic kidney complication: Secondary | ICD-10-CM | POA: Diagnosis not present

## 2017-10-12 DIAGNOSIS — I10 Essential (primary) hypertension: Secondary | ICD-10-CM | POA: Diagnosis not present

## 2017-10-12 DIAGNOSIS — E7849 Other hyperlipidemia: Secondary | ICD-10-CM | POA: Diagnosis not present

## 2017-10-12 DIAGNOSIS — E538 Deficiency of other specified B group vitamins: Secondary | ICD-10-CM | POA: Diagnosis not present

## 2017-10-16 ENCOUNTER — Other Ambulatory Visit: Payer: Self-pay | Admitting: Surgery

## 2017-10-16 DIAGNOSIS — D0581 Other specified type of carcinoma in situ of right breast: Secondary | ICD-10-CM

## 2017-10-16 DIAGNOSIS — N6091 Unspecified benign mammary dysplasia of right breast: Secondary | ICD-10-CM | POA: Diagnosis not present

## 2017-10-16 DIAGNOSIS — D0501 Lobular carcinoma in situ of right breast: Secondary | ICD-10-CM | POA: Diagnosis not present

## 2017-10-19 DIAGNOSIS — E538 Deficiency of other specified B group vitamins: Secondary | ICD-10-CM | POA: Diagnosis not present

## 2017-10-19 DIAGNOSIS — E1129 Type 2 diabetes mellitus with other diabetic kidney complication: Secondary | ICD-10-CM | POA: Diagnosis not present

## 2017-10-19 DIAGNOSIS — Z0000 Encounter for general adult medical examination without abnormal findings: Secondary | ICD-10-CM | POA: Diagnosis not present

## 2017-10-19 DIAGNOSIS — F329 Major depressive disorder, single episode, unspecified: Secondary | ICD-10-CM | POA: Diagnosis not present

## 2017-10-19 DIAGNOSIS — E1149 Type 2 diabetes mellitus with other diabetic neurological complication: Secondary | ICD-10-CM | POA: Diagnosis not present

## 2017-10-19 DIAGNOSIS — E7849 Other hyperlipidemia: Secondary | ICD-10-CM | POA: Diagnosis not present

## 2017-10-19 DIAGNOSIS — I10 Essential (primary) hypertension: Secondary | ICD-10-CM | POA: Diagnosis not present

## 2017-10-19 DIAGNOSIS — Z6831 Body mass index (BMI) 31.0-31.9, adult: Secondary | ICD-10-CM | POA: Diagnosis not present

## 2017-10-19 DIAGNOSIS — E781 Pure hyperglyceridemia: Secondary | ICD-10-CM | POA: Diagnosis not present

## 2017-10-19 DIAGNOSIS — D0501 Lobular carcinoma in situ of right breast: Secondary | ICD-10-CM | POA: Diagnosis not present

## 2017-10-19 DIAGNOSIS — Z1389 Encounter for screening for other disorder: Secondary | ICD-10-CM | POA: Diagnosis not present

## 2017-10-19 DIAGNOSIS — Z720 Tobacco use: Secondary | ICD-10-CM | POA: Diagnosis not present

## 2017-10-20 ENCOUNTER — Encounter: Payer: Self-pay | Admitting: Oncology

## 2017-10-20 NOTE — Pre-Procedure Instructions (Addendum)
Melanie [name]  10/20/2017    Your procedure is scheduled on Tuesday, October 27, 2017 at 9:00 AM.   Report to [facility] Entrance "A" Admitting Office at 7:00 AM.   Call this number if you have problems the morning of surgery: [phone]   Questions prior to day of surgery, please call [phone] between 8 & 4 PM.   Remember:  Do not eat food or drink liquids after midnight Monday, 10/26/17.  Take these medicines the morning of surgery with A SIP OF WATER: Bisoprolol-Hydrochlorothiazide (Ziac), Gabapentin (Neurontin), Oxycodone - if needed. Do not take your evening dose of Glipizide Monday, 10/26/17. Do not take Glipizide and Metformin the morning of surgery.  Stop Fish Oil, Aspirin and NSAIDS (Ibuprofen, Aleve, etc) as of today.    Do not smoke 24 hours prior to surgery.   How to Manage Your Diabetes Before Surgery   Why is it important to control my blood sugar before and after surgery?   Improving blood sugar levels before and after surgery helps healing and can limit problems.  A way of improving blood sugar control is eating a healthy diet by:  - Eating less sugar and carbohydrates  - Increasing activity/exercise  - Talk with your doctor about reaching your blood sugar goals  High blood sugars (greater than 180 mg/dL) can raise your risk of infections and slow down your recovery so you will need to focus on controlling your diabetes during the weeks before surgery.  Make sure that the doctor who takes care of your diabetes knows about your planned surgery including the date and location.  How do I manage my blood sugars before surgery?   Check your blood sugar at least 4 times a day, 2 days before surgery to make sure that they are not too high or low.  Check your blood sugar the morning of your surgery when you wake up and every 2 hours until you get to the Short-Stay unit.  Treat a low blood sugar (less than 70 mg/dL) with 1/2 cup of clear juice  (cranberry or apple), 4 glucose tablets, OR glucose gel.  Recheck blood sugar in 15 minutes after treatment (to make sure it is greater than 70 mg/dL).  If blood sugar is not greater than 70 mg/dL on re-check, call [phone] for further instructions.   Report your blood sugar to the Short-Stay nurse when you get to Short-Stay.  References:  [facility], 2007 "How to Manage your Diabetes Before and After Surgery".  Drink Ensure Pre-Surgery the morning of surgery as you're leaving for the hospital.   Do not wear jewelry, make-up or nail polish.  Do not wear lotions, powders, perfumes or deodorant.  Do not shave 48 hours prior to surgery.   Do not bring valuables to the hospital.  [facility] is not responsible for any belongings or valuables.  Contacts, dentures or bridgework may not be worn into surgery.  Leave your suitcase in the car.  After surgery it may be brought to your room.  For patients admitted to the hospital, discharge time will be determined by your treatment team.  Patients discharged the day of surgery will not be allowed to drive home.   [place] - Preparing for Surgery  Before surgery, you can play an important role.  Because skin is not sterile, your skin needs to be as free of germs as possible.  You can reduce the number of germs on you skin by  washing with CHG (chlorahexidine gluconate) soap before surgery.  CHG is an antiseptic cleaner which kills germs and bonds with the skin to continue killing germs even after washing.  Please DO NOT use if you have an allergy to CHG or antibacterial soaps.  If your skin becomes reddened/irritated stop using the CHG and inform your nurse when you arrive at Short Stay.  Do not shave (including legs and underarms) for at least 48 hours prior to the first CHG shower.  You may shave your face.  Please follow these instructions carefully:   1.  Shower with CHG Soap the night before surgery and the                     morning of Surgery.  2.  If you choose to wash your hair, wash your hair first as usual with your       normal shampoo.  3.  After you shampoo, rinse your hair and body thoroughly to remove the shampoo.  4.  Use CHG as you would any other liquid soap.  You can apply chg directly       to the skin and wash gently with scrungie or a clean washcloth.  5.  Apply the CHG Soap to your body ONLY FROM THE NECK DOWN.        Do not use on open wounds or open sores.  Avoid contact with your eyes, ears, mouth and genitals (private parts).  Wash genitals (private parts) with your normal soap.  6.  Wash thoroughly, paying special attention to the area where your surgery        will be performed.  7.  Thoroughly rinse your body with warm water from the neck down.  8.  DO NOT shower/wash with your normal soap after using and rinsing off       the CHG Soap.  9.  Pat yourself dry with a clean towel.            10.  Wear clean pajamas.            11.  Place clean sheets on your bed the night of your first shower and do not        sleep with pets.  Day of Surgery  Shower as above. Do not apply any lotions/deodorants the morning of surgery.  Please wear clean clothes to the hospital.   Please read over the fact sheets that you were given.

## 2017-10-21 ENCOUNTER — Encounter (HOSPITAL_COMMUNITY)
Admission: RE | Admit: 2017-10-21 | Discharge: 2017-10-21 | Disposition: A | Discharge status: Home / Self Care | Payer: Medicare Other | Source: Ambulatory Visit | Attending: Surgery | Admitting: Surgery

## 2017-10-21 ENCOUNTER — Other Ambulatory Visit: Payer: Self-pay

## 2017-10-21 ENCOUNTER — Encounter (HOSPITAL_COMMUNITY): Payer: Self-pay

## 2017-10-21 ENCOUNTER — Other Ambulatory Visit (HOSPITAL_COMMUNITY): Payer: Self-pay | Admitting: *Deleted

## 2017-10-21 DIAGNOSIS — D0581 Other specified type of carcinoma in situ of right breast: Secondary | ICD-10-CM | POA: Diagnosis not present

## 2017-10-21 DIAGNOSIS — Z79899 Other long term (current) drug therapy: Secondary | ICD-10-CM | POA: Diagnosis not present

## 2017-10-21 DIAGNOSIS — Z7984 Long term (current) use of oral hypoglycemic drugs: Secondary | ICD-10-CM | POA: Insufficient documentation

## 2017-10-21 DIAGNOSIS — R9431 Abnormal electrocardiogram [ECG] [EKG]: Secondary | ICD-10-CM | POA: Diagnosis not present

## 2017-10-21 DIAGNOSIS — I10 Essential (primary) hypertension: Secondary | ICD-10-CM | POA: Insufficient documentation

## 2017-10-21 DIAGNOSIS — Z1212 Encounter for screening for malignant neoplasm of rectum: Secondary | ICD-10-CM | POA: Diagnosis not present

## 2017-10-21 DIAGNOSIS — Z7982 Long term (current) use of aspirin: Secondary | ICD-10-CM | POA: Diagnosis not present

## 2017-10-21 DIAGNOSIS — Z01810 Encounter for preprocedural cardiovascular examination: Secondary | ICD-10-CM | POA: Diagnosis not present

## 2017-10-21 LAB — CBC
HCT: 39.3 % (ref 36.0–46.0)
Hemoglobin: 12.6 g/dL (ref 12.0–15.0)
MCH: 30.7 pg (ref 26.0–34.0)
MCHC: 32.1 g/dL (ref 30.0–36.0)
MCV: 95.6 fL (ref 78.0–100.0)
Platelets: 251 10*3/uL (ref 150–400)
RBC: 4.11 MIL/uL (ref 3.87–5.11)
RDW: 15.4 % (ref 11.5–15.5)
WBC: 9.7 10*3/uL (ref 4.0–10.5)

## 2017-10-21 LAB — BASIC METABOLIC PANEL
Anion gap: 13 (ref 5–15)
BUN: 22 mg/dL — ABNORMAL HIGH (ref 6–20)
CO2: 18 mmol/L — ABNORMAL LOW (ref 22–32)
Calcium: 9.2 mg/dL (ref 8.9–10.3)
Chloride: 103 mmol/L (ref 101–111)
Creatinine, Ser: 1.17 mg/dL — ABNORMAL HIGH (ref 0.44–1.00)
GFR calc Af Amer: 54 mL/min — ABNORMAL LOW (ref >60)
GFR calc non Af Amer: 46 mL/min — ABNORMAL LOW (ref >60)
Glucose, Bld: 154 mg/dL — ABNORMAL HIGH (ref 65–99)
Potassium: 4.2 mmol/L (ref 3.5–5.1)
Sodium: 134 mmol/L — ABNORMAL LOW (ref 135–145)

## 2017-10-21 LAB — GLUCOSE, CAPILLARY: Glucose-Capillary: 218 mg/dL — ABNORMAL HIGH (ref 65–99)

## 2017-10-21 NOTE — Progress Notes (Signed)
Anesthesia Chart Review:  Pt is a 72 year old female scheduled for R breast mastectomy with radioactive seed localization on 10/27/2017 with Coralie Keens, MD  - PCP is Marton Redwood, MD - Cardiologist is [name], MD. Last office visit 10/13/16; 1 year f/u recommended.   PMH includes:  PAF (1990's, none since), HTN,, bradycardia, DM, GERD.  Current smoker.  BMI 33. S/p lumbar microdiscectomy 02/18/17 and 07/20/13. S/p lumbar laminectomy 11/28/16. S/p cholecystectomy 12/28/14.   Medications include: ASA 81 mg, bisoprolol-HCTZ, glipizide, lisinopril, metformin  BP 129/61   Pulse 64   Temp 36.6 C   Resp 20   Ht 5\' 4"  (1.626 m)   Wt 192 lb (87.1 kg)   SpO2 98%   BMI 32.96 kg/m   Preoperative labs reviewed.    EKG 10/21/17: NSR. Nonspecific ST and T wave abnormality  Nuclear stress test 10/16/16:  - Low risk stress nuclear study with apical thinning and prior inferior basal infarct with mild peri-infarct ischemia; EF 45 with akinesis of the basal inferior wall; mild LVE.  Carotid duplex 07/17/14:  1. Evidence of minimal (<40%) plaque of B ICA 2. B vertebral artery is antegrade  If no changes, I anticipate pt can proceed with surgery as scheduled.   Willeen Cass, FNP-BC [facility]/Anesthesiology Phone: [phone] 10/21/2017 4:12 PM

## 2017-10-21 NOTE — Progress Notes (Signed)
Pt states she has a hx of A-fib in the late 1990's. States she's not had any issues with A-fib since. She does see Dr. Acie Fredrickson yearly. Last office visit was 09/2016. She states she will be calling to get an appt. She denies any chest pain or sob. Pt is a type 2 diabetic. Last A1C was 7.4 on 10/19/17. Pt states her fasting blood sugar is usually around 130.

## 2017-10-22 DIAGNOSIS — J111 Influenza due to unidentified influenza virus with other respiratory manifestations: Secondary | ICD-10-CM | POA: Diagnosis not present

## 2017-10-22 DIAGNOSIS — J209 Acute bronchitis, unspecified: Secondary | ICD-10-CM | POA: Diagnosis not present

## 2017-10-22 DIAGNOSIS — J0100 Acute maxillary sinusitis, unspecified: Secondary | ICD-10-CM | POA: Diagnosis not present

## 2017-10-26 ENCOUNTER — Ambulatory Visit
Admission: RE | Admit: 2017-10-26 | Discharge: 2017-10-26 | Disposition: A | Discharge status: Home / Self Care | Payer: Medicare Other | Source: Ambulatory Visit | Attending: Surgery | Admitting: Surgery

## 2017-10-26 DIAGNOSIS — D0501 Lobular carcinoma in situ of right breast: Secondary | ICD-10-CM | POA: Diagnosis not present

## 2017-10-26 DIAGNOSIS — D0581 Other specified type of carcinoma in situ of right breast: Secondary | ICD-10-CM

## 2017-10-26 NOTE — H&P (Signed)
LOUSIE CALICO Documented: 10/16/2017 10:38 AM Location: [facility] Patient #: [id number] DOB: 1946/07/06 Married / Language: English / Race: White Female   History of Present Illness (Marleny Faller A. Ninfa Linden MD; 10/16/2017 11:10 AM) The patient is a 72 year old female who presents with a complaint of Breast problems. This is a pleasant patient referred by Dr. 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 She had an abnormal area seen in the right breast on screening mammography. Ultrasound showed no definitive mass. A stereotactic biopsy of the area of concern on the mammogram showed the above findings. She has a strong family history of breast and ovarian cancer in her sister. She's had no previous problems with her breasts. She denies nipple discharge. She had biopsy well without complaints.   Past Surgical History Levonne Spiller, CMA; 10/16/2017 10:40 AM) Breast Biopsy  Right. Gallbladder Surgery - Laparoscopic  Hysterectomy (not due to cancer) - Partial  Knee Surgery  Bilateral. Oral Surgery  Spinal Surgery - Lower Back   Diagnostic Studies History Levonne Spiller, CMA; 10/16/2017 10:40 AM) Colonoscopy  >10 years ago 5-10 years ago Mammogram  within last year Pap Smear  >5 years ago  Allergies Levonne Spiller, CMA; 10/16/2017 10:38 AM) Fenofibrate *ANTIHYPERLIPIDEMICS*  Metoprolol Succinate *BETA BLOCKERS*  Allergies Reconciled   Medication History (Danielle Gerrigner, CMA; 10/16/2017 10:39 AM) Zoloft (50MG  Tablet, Oral) Active. GlipiZIDE XL (5MG  Tablet ER 24HR, Oral) Active. Exenatide ER (2MG  Pen-injector, Subcutaneous) Active. Ranitidine HCl (150MG  Capsule, Oral) Active. Excedrin Migraine (250-250-65MG  Tablet, Oral) Active. Aspirin EC (81MG  Tablet DR, Oral) Active. Bisoprolol-Hydrochlorothiazide (5-6.25MG  Tablet, Oral) Active. Lisinopril (20MG  Tablet, Oral) Active. MetFORMIN  HCl (500MG  Tablet, Oral) Active. Medications Reconciled  Social History Andee Poles Education officer, [employer], CMA; 10/16/2017 10:40 AM) Caffeine use  Carbonated beverages, Tea. No alcohol use  No drug use  Tobacco use  Current every day smoker.  Family History Levonne Spiller, CMA; 10/16/2017 10:40 AM) Alcohol Abuse  Father. Arthritis  Mother, Sister. Breast Cancer  Sister. Cerebrovascular Accident  Sister. Heart Disease  Father. Heart disease in female family member before age 23  Hypertension  Mother, Sister. Ovarian Cancer  Sister. Thyroid problems  Sister.  Pregnancy / Birth History Levonne Spiller, CMA; 10/16/2017 10:40 AM) Age at menarche  13 years. Age of menopause  [age] Contraceptive History  Oral contraceptives. Gravida  2 Maternal age  44-25 Para  2  Other Problems Levonne Spiller, CMA; 10/16/2017 10:40 AM) Arthritis  Back Pain  Cholelithiasis  Diabetes Mellitus  Gastroesophageal Reflux Disease  Hemorrhoids  High blood pressure  Hypercholesterolemia     Review of Systems Andee Poles Gerrigner CMA; 10/16/2017 10:40 AM) General Not Present- Appetite Loss, Chills, Fatigue, Fever, Night Sweats, Weight Gain and Weight Loss. Skin Not Present- Change in Wart/Mole, Dryness, Hives, Jaundice, New Lesions, Non-Healing Wounds, Rash and Ulcer. HEENT Present- Wears glasses/contact lenses. Not Present- Earache, Hearing Loss, Hoarseness, Nose Bleed, Oral Ulcers, Ringing in the Ears, Seasonal Allergies, Sinus Pain, Sore Throat, Visual Disturbances and Yellow Eyes. Respiratory Not Present- Bloody sputum, Chronic Cough, Difficulty Breathing, Snoring and Wheezing. Breast Not Present- Breast Mass, Breast Pain, Nipple Discharge and Skin Changes. Cardiovascular Not Present- Chest Pain, Difficulty Breathing Lying Down, Leg Cramps, Palpitations, Rapid Heart Rate, Shortness of Breath and Swelling of Extremities. Gastrointestinal Present- Hemorrhoids. Not Present- Abdominal  Pain, Bloating, Bloody Stool, Change in Bowel Habits, Chronic diarrhea, Constipation, Difficulty Swallowing, Excessive gas, Gets full quickly at meals, Indigestion, Nausea, Rectal Pain and Vomiting. Female  Genitourinary Present- Frequency and Urgency. Not Present- Nocturia, Painful Urination and Pelvic Pain. Musculoskeletal Not Present- Back Pain, Joint Pain, Joint Stiffness, Muscle Pain, Muscle Weakness and Swelling of Extremities. Neurological Not Present- Decreased Memory, Fainting, Headaches, Numbness, Seizures, Tingling, Tremor, Trouble walking and Weakness. Psychiatric Not Present- Anxiety, Bipolar, Change in Sleep Pattern, Depression, Fearful and Frequent crying. Endocrine Not Present- Cold Intolerance, Excessive Hunger, Hair Changes, Heat Intolerance, Hot flashes and New Diabetes. Hematology Not Present- Blood Thinners, Easy Bruising, Excessive bleeding, Gland problems, HIV and Persistent Infections.  Vitals (Danielle Gerrigner CMA; 10/16/2017 10:40 AM) 10/16/2017 10:39 AM Weight: 193.38 lb Height: 67in Body Surface Area: 1.99 m Body Mass Index: 30.29 kg/m  Temp.: 98.42F(Oral)  Pulse: 90 (Regular)  BP: 164/84 (Sitting, Left Arm, Standard)       Physical Exam (Talissa Apple A. Ninfa Linden MD; 10/16/2017 11:11 AM) General Mental Status-Alert. General Appearance-Consistent with stated age. Hydration-Well hydrated. Voice-Normal.  Head and Neck Head-normocephalic, atraumatic with no lesions or palpable masses. Trachea-midline. Thyroid Gland Characteristics - normal size and consistency.  Eye Eyeball - Bilateral-Extraocular movements intact. Sclera/Conjunctiva - Bilateral-No scleral icterus.  Chest and Lung Exam Chest and lung exam reveals -quiet, even and easy respiratory effort with no use of accessory muscles and on auscultation, normal breath sounds, no adventitious sounds and normal vocal resonance. Inspection Chest Wall - Normal. Back -  normal.  Breast Breast - Left-Symmetric, Non Tender, No Biopsy scars, no Dimpling, No Inflammation, No Lumpectomy scars, No Mastectomy scars, No Peau d' Orange. Breast - Right-Symmetric, Non Tender, No Biopsy scars, no Dimpling, No Inflammation, No Lumpectomy scars, No Mastectomy scars, No Peau d' Orange. Breast Lump-No Palpable Breast Mass. Note: There are no palpable masses in either breast and no axillary adenopathy on either side. There is a large mole on the right breast at the 9 o'clock position   Cardiovascular Cardiovascular examination reveals -normal heart sounds, regular rate and rhythm with no murmurs and normal pedal pulses bilaterally.  Abdomen Inspection Inspection of the abdomen reveals - No Hernias. Skin - Scar - no surgical scars. Palpation/Percussion Palpation and Percussion of the abdomen reveal - Soft, Non Tender, No Rebound tenderness, No Rigidity (guarding) and No hepatosplenomegaly. Auscultation Auscultation of the abdomen reveals - Bowel sounds normal.  Neurologic - Did not examine.  Musculoskeletal - Did not examine.  Lymphatic Head & Neck  General Head & Neck Lymphatics: Bilateral - Description - Normal. Axillary  General Axillary Region: Bilateral - Description - Normal. Tenderness - Non Tender. Femoral & Inguinal - Did not examine.    Assessment & Plan (Maryna Yeagle A. 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 A radioactive seed guided right breast partial mastectomy is recommended for histologic evaluation from invasive cancer. I discussed the reasoning for this with her in detail. I discussed risks of surgery which includes but is not limited to bleeding, infection, injury to surrounding structures, the need for further surgery if invasive cancer is found, cardiopulmonary issues, postop recovery,  etc. She would like me to make my incision where her large mole breast is removed that at the same time which is completely reasonable. Surgery will be scheduled ATYPICAL DUCTAL HYPERPLASIA OF RIGHT BREAST (N60.91)

## 2017-10-27 ENCOUNTER — Encounter (HOSPITAL_COMMUNITY)
Admission: RE | Disposition: A | Discharge status: Home / Self Care | Payer: Self-pay | Source: Ambulatory Visit | Attending: Surgery

## 2017-10-27 ENCOUNTER — Ambulatory Visit
Admission: RE | Admit: 2017-10-27 | Discharge: 2017-10-27 | Disposition: A | Discharge status: Home / Self Care | Payer: Medicare Other | Source: Ambulatory Visit | Attending: Surgery | Admitting: Surgery

## 2017-10-27 ENCOUNTER — Ambulatory Visit (HOSPITAL_COMMUNITY): Payer: Medicare Other | Admitting: Emergency Medicine

## 2017-10-27 ENCOUNTER — Encounter (HOSPITAL_COMMUNITY): Payer: Self-pay | Admitting: *Deleted

## 2017-10-27 ENCOUNTER — Ambulatory Visit (HOSPITAL_COMMUNITY): Payer: Medicare Other | Admitting: Certified Registered"

## 2017-10-27 ENCOUNTER — Ambulatory Visit (HOSPITAL_COMMUNITY)
Admission: RE | Admit: 2017-10-27 | Discharge: 2017-10-27 | Disposition: A | Discharge status: Home / Self Care | Payer: Medicare Other | Source: Ambulatory Visit | Attending: Surgery | Admitting: Surgery

## 2017-10-27 DIAGNOSIS — E559 Vitamin D deficiency, unspecified: Secondary | ICD-10-CM | POA: Diagnosis not present

## 2017-10-27 DIAGNOSIS — N6091 Unspecified benign mammary dysplasia of right breast: Secondary | ICD-10-CM | POA: Diagnosis not present

## 2017-10-27 DIAGNOSIS — Z79899 Other long term (current) drug therapy: Secondary | ICD-10-CM | POA: Insufficient documentation

## 2017-10-27 DIAGNOSIS — F329 Major depressive disorder, single episode, unspecified: Secondary | ICD-10-CM | POA: Diagnosis not present

## 2017-10-27 DIAGNOSIS — Z8249 Family history of ischemic heart disease and other diseases of the circulatory system: Secondary | ICD-10-CM | POA: Insufficient documentation

## 2017-10-27 DIAGNOSIS — D0501 Lobular carcinoma in situ of right breast: Secondary | ICD-10-CM | POA: Insufficient documentation

## 2017-10-27 DIAGNOSIS — G8918 Other acute postprocedural pain: Secondary | ICD-10-CM | POA: Diagnosis not present

## 2017-10-27 DIAGNOSIS — Z6830 Body mass index (BMI) 30.0-30.9, adult: Secondary | ICD-10-CM | POA: Insufficient documentation

## 2017-10-27 DIAGNOSIS — N6021 Fibroadenosis of right breast: Secondary | ICD-10-CM | POA: Diagnosis not present

## 2017-10-27 DIAGNOSIS — K219 Gastro-esophageal reflux disease without esophagitis: Secondary | ICD-10-CM | POA: Diagnosis not present

## 2017-10-27 DIAGNOSIS — I10 Essential (primary) hypertension: Secondary | ICD-10-CM | POA: Insufficient documentation

## 2017-10-27 DIAGNOSIS — E119 Type 2 diabetes mellitus without complications: Secondary | ICD-10-CM | POA: Insufficient documentation

## 2017-10-27 DIAGNOSIS — E7800 Pure hypercholesterolemia, unspecified: Secondary | ICD-10-CM | POA: Insufficient documentation

## 2017-10-27 DIAGNOSIS — D0581 Other specified type of carcinoma in situ of right breast: Secondary | ICD-10-CM

## 2017-10-27 DIAGNOSIS — Z888 Allergy status to other drugs, medicaments and biological substances status: Secondary | ICD-10-CM | POA: Diagnosis not present

## 2017-10-27 DIAGNOSIS — Z7982 Long term (current) use of aspirin: Secondary | ICD-10-CM | POA: Diagnosis not present

## 2017-10-27 DIAGNOSIS — M1990 Unspecified osteoarthritis, unspecified site: Secondary | ICD-10-CM | POA: Insufficient documentation

## 2017-10-27 DIAGNOSIS — R928 Other abnormal and inconclusive findings on diagnostic imaging of breast: Secondary | ICD-10-CM | POA: Diagnosis not present

## 2017-10-27 DIAGNOSIS — E669 Obesity, unspecified: Secondary | ICD-10-CM | POA: Insufficient documentation

## 2017-10-27 DIAGNOSIS — Z7984 Long term (current) use of oral hypoglycemic drugs: Secondary | ICD-10-CM | POA: Diagnosis not present

## 2017-10-27 DIAGNOSIS — N6011 Diffuse cystic mastopathy of right breast: Secondary | ICD-10-CM | POA: Diagnosis not present

## 2017-10-27 DIAGNOSIS — D241 Benign neoplasm of right breast: Secondary | ICD-10-CM | POA: Diagnosis not present

## 2017-10-27 DIAGNOSIS — F17200 Nicotine dependence, unspecified, uncomplicated: Secondary | ICD-10-CM | POA: Diagnosis not present

## 2017-10-27 HISTORY — PX: BREAST LUMPECTOMY WITH RADIOACTIVE SEED LOCALIZATION: SHX6424

## 2017-10-27 LAB — GLUCOSE, CAPILLARY
Glucose-Capillary: 292 mg/dL — ABNORMAL HIGH (ref 65–99)
Glucose-Capillary: 130 mg/dL — ABNORMAL HIGH (ref 65–99)

## 2017-10-27 SURGERY — BREAST LUMPECTOMY WITH RADIOACTIVE SEED LOCALIZATION
Anesthesia: General | Site: Breast | Laterality: Right

## 2017-10-27 MED ORDER — ROCURONIUM BROMIDE 100 MG/10ML IV SOLN
INTRAVENOUS | Status: DC | PRN
  Administered 2017-10-27: 50 mg via INTRAVENOUS

## 2017-10-27 MED ORDER — CHLORHEXIDINE GLUCONATE CLOTH 2 % EX PADS: 6.0000 | MEDICATED_PAD | Freq: Once | CUTANEOUS | Status: DC

## 2017-10-27 MED ORDER — ACETAMINOPHEN 325 MG PO TABS: 650.0000 mg | ORAL_TABLET | ORAL | Status: DC | PRN

## 2017-10-27 MED ORDER — 0.9 % SODIUM CHLORIDE (POUR BTL) OPTIME
TOPICAL | Status: DC | PRN
  Administered 2017-10-27: 1000 mL

## 2017-10-27 MED ORDER — CEFAZOLIN SODIUM-DEXTROSE 2-4 GM/100ML-% IV SOLN
INTRAVENOUS | Status: AC
  Filled 2017-10-27: qty 100

## 2017-10-27 MED ORDER — SODIUM CHLORIDE 0.9% FLUSH: 3.0000 mL | Freq: Two times a day (BID) | INTRAVENOUS | Status: DC

## 2017-10-27 MED ORDER — FENTANYL CITRATE (PF) 100 MCG/2ML IJ SOLN
50.0000 ug | Freq: Once | INTRAMUSCULAR | Status: AC
  Administered 2017-10-27: 50 ug via INTRAVENOUS

## 2017-10-27 MED ORDER — MIDAZOLAM HCL 2 MG/2ML IJ SOLN
2.0000 mg | Freq: Once | INTRAMUSCULAR | Status: AC
  Administered 2017-10-27: 2 mg via INTRAVENOUS

## 2017-10-27 MED ORDER — SUGAMMADEX SODIUM 200 MG/2ML IV SOLN
INTRAVENOUS | Status: DC | PRN
  Administered 2017-10-27: 180 mg via INTRAVENOUS

## 2017-10-27 MED ORDER — PROPOFOL 10 MG/ML IV BOLUS
INTRAVENOUS | Status: AC
  Filled 2017-10-27: qty 20

## 2017-10-27 MED ORDER — GABAPENTIN 300 MG PO CAPS: 300.0000 mg | ORAL_CAPSULE | ORAL | Status: DC

## 2017-10-27 MED ORDER — OXYCODONE HCL 5 MG PO TABS: 5.0000 mg | ORAL_TABLET | Freq: Four times a day (QID) | ORAL | 0 refills | Status: DC | PRN

## 2017-10-27 MED ORDER — OXYCODONE HCL 5 MG PO TABS: 5.0000 mg | ORAL_TABLET | ORAL | Status: DC | PRN

## 2017-10-27 MED ORDER — MEPERIDINE HCL 50 MG/ML IJ SOLN: 6.2500 mg | INTRAMUSCULAR | Status: DC | PRN

## 2017-10-27 MED ORDER — MORPHINE SULFATE (PF) 2 MG/ML IV SOLN
2.0000 mg | INTRAVENOUS | Status: DC | PRN
Start: 2017-10-27 — End: 2017-10-27

## 2017-10-27 MED ORDER — ACETAMINOPHEN 500 MG PO TABS
1000.0000 mg | ORAL_TABLET | ORAL | Status: AC
  Administered 2017-10-27: 1000 mg via ORAL

## 2017-10-27 MED ORDER — GABAPENTIN 300 MG PO CAPS
ORAL_CAPSULE | ORAL | Status: AC
  Filled 2017-10-27: qty 1

## 2017-10-27 MED ORDER — FENTANYL CITRATE (PF) 100 MCG/2ML IJ SOLN
INTRAMUSCULAR | Status: DC | PRN
  Administered 2017-10-27: 100 ug via INTRAVENOUS

## 2017-10-27 MED ORDER — ACETAMINOPHEN 500 MG PO TABS
ORAL_TABLET | ORAL | Status: AC
  Filled 2017-10-27: qty 2

## 2017-10-27 MED ORDER — OXYCODONE HCL 5 MG/5ML PO SOLN: 5.0000 mg | Freq: Once | ORAL | Status: DC | PRN

## 2017-10-27 MED ORDER — ACETAMINOPHEN 650 MG RE SUPP: 650.0000 mg | RECTAL | Status: DC | PRN

## 2017-10-27 MED ORDER — PROPOFOL 10 MG/ML IV BOLUS
INTRAVENOUS | Status: DC | PRN
  Administered 2017-10-27: 150 mg via INTRAVENOUS
  Administered 2017-10-27: 50 mg via INTRAVENOUS

## 2017-10-27 MED ORDER — SUGAMMADEX SODIUM 200 MG/2ML IV SOLN
INTRAVENOUS | Status: AC
  Filled 2017-10-27: qty 2

## 2017-10-27 MED ORDER — SODIUM CHLORIDE 0.9% FLUSH: 3.0000 mL | INTRAVENOUS | Status: DC | PRN

## 2017-10-27 MED ORDER — OXYCODONE HCL 5 MG PO TABS: 5.0000 mg | ORAL_TABLET | Freq: Once | ORAL | Status: DC | PRN

## 2017-10-27 MED ORDER — CEFAZOLIN SODIUM-DEXTROSE 2-4 GM/100ML-% IV SOLN: 2.0000 g | INTRAVENOUS | Status: DC

## 2017-10-27 MED ORDER — LIDOCAINE 2% (20 MG/ML) 5 ML SYRINGE
INTRAMUSCULAR | Status: AC
  Filled 2017-10-27: qty 5

## 2017-10-27 MED ORDER — BUPIVACAINE-EPINEPHRINE 0.5% -1:200000 IJ SOLN
INTRAMUSCULAR | Status: DC | PRN
  Administered 2017-10-27: 20 mL

## 2017-10-27 MED ORDER — DEXAMETHASONE SODIUM PHOSPHATE 10 MG/ML IJ SOLN
INTRAMUSCULAR | Status: AC
  Filled 2017-10-27: qty 1

## 2017-10-27 MED ORDER — PHENYLEPHRINE 40 MCG/ML (10ML) SYRINGE FOR IV PUSH (FOR BLOOD PRESSURE SUPPORT)
PREFILLED_SYRINGE | INTRAVENOUS | Status: DC | PRN
  Administered 2017-10-27 (×3): 120 ug via INTRAVENOUS

## 2017-10-27 MED ORDER — LACTATED RINGERS IV SOLN
INTRAVENOUS | Status: DC
  Administered 2017-10-27 (×2): via INTRAVENOUS

## 2017-10-27 MED ORDER — ROPIVACAINE HCL 5 MG/ML IJ SOLN
INTRAMUSCULAR | Status: DC | PRN
  Administered 2017-10-27: 30 mL via PERINEURAL

## 2017-10-27 MED ORDER — ONDANSETRON HCL 4 MG/2ML IJ SOLN
INTRAMUSCULAR | Status: DC | PRN
  Administered 2017-10-27: 4 mg via INTRAVENOUS

## 2017-10-27 MED ORDER — ONDANSETRON HCL 4 MG/2ML IJ SOLN
INTRAMUSCULAR | Status: AC
  Filled 2017-10-27: qty 2

## 2017-10-27 MED ORDER — FENTANYL CITRATE (PF) 100 MCG/2ML IJ SOLN
INTRAMUSCULAR | Status: AC
  Filled 2017-10-27: qty 2

## 2017-10-27 MED ORDER — PROMETHAZINE HCL 25 MG/ML IJ SOLN: 6.2500 mg | INTRAMUSCULAR | Status: DC | PRN

## 2017-10-27 MED ORDER — MIDAZOLAM HCL 2 MG/2ML IJ SOLN
INTRAMUSCULAR | Status: AC
  Filled 2017-10-27: qty 2

## 2017-10-27 MED ORDER — LIDOCAINE 2% (20 MG/ML) 5 ML SYRINGE
INTRAMUSCULAR | Status: DC | PRN
  Administered 2017-10-27: 80 mg via INTRAVENOUS

## 2017-10-27 MED ORDER — SODIUM CHLORIDE 0.9 % IV SOLN: 250.0000 mL | INTRAVENOUS | Status: DC | PRN

## 2017-10-27 MED ORDER — ROCURONIUM BROMIDE 10 MG/ML (PF) SYRINGE
PREFILLED_SYRINGE | INTRAVENOUS | Status: AC
  Filled 2017-10-27: qty 5

## 2017-10-27 MED ORDER — HYDROMORPHONE HCL 1 MG/ML IJ SOLN: 0.2500 mg | INTRAMUSCULAR | Status: DC | PRN

## 2017-10-27 MED ORDER — FENTANYL CITRATE (PF) 250 MCG/5ML IJ SOLN
INTRAMUSCULAR | Status: AC
  Filled 2017-10-27: qty 5

## 2017-10-27 SURGICAL SUPPLY — 43 items
ADH SKN CLS APL DERMABOND .7 (GAUZE/BANDAGES/DRESSINGS) ×1
APPLIER CLIP 9.375 MED OPEN (MISCELLANEOUS) ×3
APR CLP MED 9.3 20 MLT OPN (MISCELLANEOUS) ×1
BINDER BREAST LRG (GAUZE/BANDAGES/DRESSINGS) IMPLANT
BINDER BREAST XLRG (GAUZE/BANDAGES/DRESSINGS) IMPLANT
BLADE SURG 15 STRL LF DISP TIS (BLADE) ×1 IMPLANT
BLADE SURG 15 STRL SS (BLADE) ×3
CANISTER SUCT 3000ML PPV (MISCELLANEOUS) ×3 IMPLANT
CHLORAPREP W/TINT 26ML (MISCELLANEOUS) ×3 IMPLANT
CLIP APPLIE 9.375 MED OPEN (MISCELLANEOUS) ×1 IMPLANT
COVER PROBE W GEL 5X96 (DRAPES) ×3 IMPLANT
COVER SURGICAL LIGHT HANDLE (MISCELLANEOUS) ×3 IMPLANT
DERMABOND ADVANCED (GAUZE/BANDAGES/DRESSINGS) ×2
DERMABOND ADVANCED .7 DNX12 (GAUZE/BANDAGES/DRESSINGS) ×1 IMPLANT
DEVICE DUBIN SPECIMEN MAMMOGRA (MISCELLANEOUS) ×3 IMPLANT
DRAPE CHEST BREAST 15X10 FENES (DRAPES) ×3 IMPLANT
DRAPE UTILITY XL STRL (DRAPES) ×1 IMPLANT
ELECT CAUTERY BLADE 6.4 (BLADE) ×3 IMPLANT
ELECT REM PT RETURN 9FT ADLT (ELECTROSURGICAL) ×3
ELECTRODE REM PT RTRN 9FT ADLT (ELECTROSURGICAL) ×1 IMPLANT
GLOVE SURG SIGNA 7.5 PF LTX (GLOVE) ×3 IMPLANT
GOWN STRL REUS W/ TWL LRG LVL3 (GOWN DISPOSABLE) ×1 IMPLANT
GOWN STRL REUS W/ TWL XL LVL3 (GOWN DISPOSABLE) ×1 IMPLANT
GOWN STRL REUS W/TWL LRG LVL3 (GOWN DISPOSABLE) ×3
GOWN STRL REUS W/TWL XL LVL3 (GOWN DISPOSABLE) ×3
KIT BASIN OR (CUSTOM PROCEDURE TRAY) ×3 IMPLANT
KIT MARKER MARGIN INK (KITS) ×3 IMPLANT
NDL HYPO 25GX1X1/2 BEV (NEEDLE) ×1 IMPLANT
NEEDLE HYPO 25GX1X1/2 BEV (NEEDLE) ×3 IMPLANT
NS IRRIG 1000ML POUR BTL (IV SOLUTION) IMPLANT
PACK GENERAL/GYN (CUSTOM PROCEDURE TRAY) ×2 IMPLANT
PACK SURGICAL SETUP 50X90 (CUSTOM PROCEDURE TRAY) ×3 IMPLANT
PENCIL BUTTON HOLSTER BLD 10FT (ELECTRODE) ×1 IMPLANT
SPONGE LAP 18X18 X RAY DECT (DISPOSABLE) ×1 IMPLANT
SUT MNCRL AB 4-0 PS2 18 (SUTURE) ×3 IMPLANT
SUT VIC AB 3-0 SH 18 (SUTURE) ×3 IMPLANT
SYR BULB 3OZ (MISCELLANEOUS) ×1 IMPLANT
SYR CONTROL 10ML LL (SYRINGE) ×3 IMPLANT
TOWEL OR 17X24 6PK STRL BLUE (TOWEL DISPOSABLE) ×3 IMPLANT
TOWEL OR 17X26 10 PK STRL BLUE (TOWEL DISPOSABLE) ×3 IMPLANT
TUBE CONNECTING 12'X1/4 (SUCTIONS)
TUBE CONNECTING 12X1/4 (SUCTIONS) ×1 IMPLANT
YANKAUER SUCT BULB TIP NO VENT (SUCTIONS) ×1 IMPLANT

## 2017-10-27 NOTE — Transfer of Care (Signed)
Immediate Anesthesia Transfer of Care Note  Patient: Melanie [name]  Procedure(s) Performed: RIGHT BREAST PARTIAL MASTECTIOMY WITH RADIOACTIVE SEED LOCALIZATION ERAS PATHWAY (Right Breast)  Patient Location: PACU  Anesthesia Type:General  Level of Consciousness: awake, alert  and oriented  Airway & Oxygen Therapy: Patient Spontanous Breathing and Patient connected to nasal cannula oxygen  Post-op Assessment: Report given to RN and Post -op Vital signs reviewed and stable  Post vital signs: Reviewed and stable  Last Vitals:  Vitals:   10/27/17 0847 10/27/17 0850  BP: (!) 104/56   Pulse: 73 75  Resp: 10 (!) 9  Temp:    SpO2: 93% 92%    Last Pain:  Vitals:   10/27/17 0720  TempSrc: Oral      Patients Stated Pain Goal: 5 (49/75/30 0511)  Complications: No apparent anesthesia complications

## 2017-10-27 NOTE — Anesthesia Preprocedure Evaluation (Signed)
Anesthesia Evaluation  Patient identified by MRN, date of birth, ID band Patient awake    Reviewed: Allergy & Precautions, NPO status , Patient's Chart, lab work & pertinent test results, reviewed documented beta blocker date and time   Airway Mallampati: II  TM Distance: >3 FB Neck ROM: Full    Dental  (+) Teeth Intact, Dental Advisory Given   Pulmonary Current Smoker,    breath sounds clear to auscultation       Cardiovascular hypertension, Pt. on medications and Pt. on home beta blockers  Rhythm:Regular Rate:Normal     Neuro/Psych  Headaches, Depression    GI/Hepatic GERD  ,  Endo/Other  diabetes, Type 2, Oral Hypoglycemic Agents  Renal/GU      Musculoskeletal  (+) Arthritis , Osteoarthritis,    Abdominal (+) + obese,   Peds  Hematology   Anesthesia Other Findings   Reproductive/Obstetrics                             Anesthesia Physical  Anesthesia Plan  ASA: III  Anesthesia Plan: General   Post-op Pain Management:    Induction: Intravenous  PONV Risk Score and Plan: 2 and Ondansetron and Midazolam  Airway Management Planned: Oral ETT  Additional Equipment:   Intra-op Plan:   Post-operative Plan: Extubation in OR  Informed Consent: I have reviewed the patients History and Physical, chart, labs and discussed the procedure including the risks, benefits and alternatives for the proposed anesthesia with the patient or authorized representative who has indicated his/her understanding and acceptance.   Dental advisory given  Plan Discussed with: CRNA and Anesthesiologist  Anesthesia Plan Comments:         Anesthesia Quick Evaluation

## 2017-10-27 NOTE — Anesthesia Procedure Notes (Signed)
Procedure Name: Intubation Date/Time: 10/27/2017 9:10 AM Performed by: Leonor Liv, CRNA Pre-anesthesia Checklist: Patient identified, Emergency Drugs available, Suction available and Patient being monitored Patient Re-evaluated:Patient Re-evaluated prior to induction Oxygen Delivery Method: Circle System Utilized Preoxygenation: Pre-oxygenation with 100% oxygen Induction Type: IV induction Ventilation: Mask ventilation without difficulty Laryngoscope Size: Mac and 3 Grade View: Grade III Tube type: Oral Tube size: 7.0 mm Number of attempts: 2 (DLx1 CRNA grade III, DLx1 grade III by MDA-bougie used) Airway Equipment and Method: Stylet and Oral airway Placement Confirmation: ETT inserted through vocal cords under direct vision,  positive ETCO2 and breath sounds checked- equal and bilateral Secured at: 21 cm Tube secured with: Tape Dental Injury: Teeth and Oropharynx as per pre-operative assessment  Comments: Difficulty- slightly anterior airway, large tongue. Able to see small amount of arytenoids but no vocal cords. Bougie used during DL by Dr. Sabra Heck, tube placed successfully. VSS throughout intubation

## 2017-10-27 NOTE — Anesthesia Postprocedure Evaluation (Signed)
Anesthesia Post Note  Patient: [name]  Procedure(s) Performed: RIGHT BREAST PARTIAL MASTECTIOMY WITH RADIOACTIVE SEED LOCALIZATION ERAS PATHWAY (Right Breast)     Patient location during evaluation: PACU Anesthesia Type: General Level of consciousness: awake and alert Pain management: pain level controlled Vital Signs Assessment: post-procedure vital signs reviewed and stable Respiratory status: spontaneous breathing, nonlabored ventilation and respiratory function stable Cardiovascular status: blood pressure returned to baseline and stable Postop Assessment: no apparent nausea or vomiting Anesthetic complications: no    Last Vitals:  Vitals:   10/27/17 1037 10/27/17 1045  BP: 123/65 130/64  Pulse: 73 74  Resp: 13 20  Temp:  (!) 36.1 C  SpO2: 94% 95%    Last Pain:  Vitals:   10/27/17 0720  TempSrc: Oral                 Lynda Rainwater

## 2017-10-27 NOTE — Op Note (Signed)
RIGHT BREAST PARTIAL MASTECTIOMY WITH RADIOACTIVE SEED LOCALIZATION ERAS PATHWAY  Procedure Note  Melanie [name] 10/27/2017   Pre-op Diagnosis: right breast LCIS and atypical ductal hyperplasia     Post-op Diagnosis: same  Procedure(s): RIGHT BREAST PARTIAL MASTECTIOMY WITH RADIOACTIVE SEED LOCALIZATION ERAS PATHWAY  Surgeon(s): Coralie Keens, MD  Anesthesia: General  Staff:  Circulator: Rosanne Sack, RN Scrub Person: Rolan Bucco Circulator Assistant: Beryle Lathe, RN  Estimated Blood Loss: Minimal               Specimens: sent to path  Indications: This is [name] patient who has had [name] recent stereotactic biopsy of the right breast showing both lobular carcinoma in situ and atypical ductal hyperplasia.  The decision was made to proceed with [name] radioactive seed guided right breast partial mastectomy  Procedure: The patient was brought to the operating room and identified as the correct patient.  She was placed supine on the operating room table and general anesthesia was induced.  Her right breast was then prepped and draped in the usual sterile fashion.  The radioactive seed was located in the lower outer quadrant of the right breast.  This was underlying [name] mole on her breast.  I made an elliptical incision on the breast transversely incorporating the old mole which the patient requests removal of.  I then dissected down to the breast tissue with electrocautery.  I then performed [name] partial mastectomy with the aid of the neoprobe staying widely around the radioactive seed going down to the chest wall.  This incorporated the lower outer quadrant of the breast.  Once the specimen was removed, I marked all margins with marker paint.  An x-ray was then performed on the specimen confirming that the radioactive seed and previous marker were in the breast specimen.  I then anesthetized the wound further with Marcaine.  Hemostasis appeared to be achieved.  I placed several  surgical clips into the biopsy cavity.  I then closed the subtenons tissue with interrupted 3-0 Vicryl sutures and closed the skin with [name] running 4-0 Monocryl.  Dermabond was then applied.  The patient tolerated procedure well.  All the counts were correct at the end of the procedure.  The patient was then extubated in the operating room and taken in [name] stable condition to the recovery room.          Melanie [name]   Date: 10/27/2017  Time: 9:45 AM

## 2017-10-27 NOTE — Anesthesia Procedure Notes (Deleted)
Performed by: Leonor Liv, CRNA

## 2017-10-27 NOTE — Interval H&P Note (Signed)
History and Physical Interval Note: no change in H and P  10/27/2017 7:35 AM  Melanie [name]  has presented today for surgery, with the diagnosis of right breast LCIS and atypical ductal hyperplasia  The various methods of treatment have been discussed with the patient and family. After consideration of risks, benefits and other options for treatment, the patient has consented to  Procedure(s): RIGHT BREAST PARTIAL MASTECTIOMY WITH RADIOACTIVE SEED LOCALIZATION ERAS PATHWAY (Right) as a surgical intervention .  The patient's history has been reviewed, patient examined, no change in status, stable for surgery.  I have reviewed the patient's chart and labs.  Questions were answered to the patient's satisfaction.     Ashle Stief A

## 2017-10-27 NOTE — Anesthesia Procedure Notes (Signed)
Anesthesia Regional Block: Pectoralis block   Pre-Anesthetic Checklist: ,, timeout performed, Correct Patient, Correct Site, Correct Laterality, Correct Procedure, Correct Position, site marked, Risks and benefits discussed,  Surgical consent,  Pre-op evaluation,  At surgeon's request and post-op pain management  Laterality: Right  Prep: chloraprep       Needles:  Injection technique: Single-shot  Needle Type: Stimiplex     Needle Length: 9cm  Needle Gauge: 21     Additional Needles:   Procedures:,,,, ultrasound used (permanent image in chart),,,,  Narrative:  Start time: 10/27/2017 8:42 AM End time: 10/27/2017 8:47 AM Injection made incrementally with aspirations every 5 mL.  Performed by: Personally  Anesthesiologist: Lynda Rainwater, MD

## 2017-10-27 NOTE — Discharge Instructions (Signed)
[place] Office Phone Number [phone]  BREAST BIOPSY/ PARTIAL MASTECTOMY: POST OP INSTRUCTIONS  Always review your discharge instruction sheet given to you by the facility where your surgery was performed.  IF YOU HAVE DISABILITY OR FAMILY LEAVE FORMS, YOU MUST BRING THEM TO THE OFFICE FOR PROCESSING.  DO NOT GIVE THEM TO YOUR DOCTOR.  1. A prescription for pain medication may be given to you upon discharge.  Take your pain medication as prescribed, if needed.  If narcotic pain medicine is not needed, then you may take acetaminophen (Tylenol) or ibuprofen (Advil) as needed. 2. Take your usually prescribed medications unless otherwise directed 3. If you need a refill on your pain medication, please contact your pharmacy.  They will contact our office to request authorization.  Prescriptions will not be filled after 5pm or on week-ends. 4. You should eat very light the first 24 hours after surgery, such as soup, crackers, pudding, etc.  Resume your normal diet the day after surgery. 5. Most patients will experience some swelling and bruising in the breast.  Ice packs and a good support bra will help.  Swelling and bruising can take several days to resolve.  6. It is common to experience some constipation if taking pain medication after surgery.  Increasing fluid intake and taking a stool softener will usually help or prevent this problem from occurring.  A mild laxative (Milk of Magnesia or Miralax) should be taken according to package directions if there are no bowel movements after 48 hours. 7. Unless discharge instructions indicate otherwise, you may remove your bandages 24-48 hours after surgery, and you may shower at that time.  You may have steri-strips (small skin tapes) in place directly over the incision.  These strips should be left on the skin for 7-10 days.  If your surgeon used skin glue on the incision, you may shower in 24 hours.  The glue will flake off over the  next 2-3 weeks.  Any sutures or staples will be removed at the office during your follow-up visit. 8. ACTIVITIES:  You may resume regular daily activities (gradually increasing) beginning the next day.  Wearing a good support bra or sports bra minimizes pain and swelling.  You may have sexual intercourse when it is comfortable. a. You may drive when you no longer are taking prescription pain medication, you can comfortably wear a seatbelt, and you can safely maneuver your car and apply brakes. b. RETURN TO WORK:  ______________________________________________________________________________________ 9. You should see your doctor in the office for a follow-up appointment approximately two weeks after your surgery.  Your doctors nurse will typically make your follow-up appointment when she calls you with your pathology report.  Expect your pathology report 2-3 business days after your surgery.  You may call to check if you do not hear from [place] after three days. 10. OTHER INSTRUCTIONS: __Okay to shower starting tomorrow 11. Ice pack, Tylenol, and ibuprofen also for pain _____________________________________________________________________________________________ _____________________________________________________________________________________________________________________________________ _____________________________________________________________________________________________________________________________________ _____________________________________________________________________________________________________________________________________  WHEN TO CALL YOUR DOCTOR: 1. Fever over 101.0 2. Nausea and/or vomiting. 3. Extreme swelling or bruising. 4. Continued bleeding from incision. 5. Increased pain, redness, or drainage from the incision.  The clinic staff is available to answer your questions during regular business hours.  Please dont hesitate to call and ask to speak to one of the  nurses for clinical concerns.  If you have a medical emergency, go to the nearest emergency room or call 911.  A surgeon from [facility] is  always on call at the hospital. ° °For further questions, please visit [email or web address]  °

## 2017-10-28 ENCOUNTER — Encounter (HOSPITAL_COMMUNITY): Payer: Self-pay | Admitting: Surgery

## 2017-11-06 ENCOUNTER — Other Ambulatory Visit: Payer: Self-pay | Admitting: Cardiovascular Disease

## 2017-11-09 ENCOUNTER — Other Ambulatory Visit: Payer: Self-pay | Admitting: *Deleted

## 2017-11-09 DIAGNOSIS — C50919 Malignant neoplasm of unspecified site of unspecified female breast: Secondary | ICD-10-CM

## 2017-11-10 ENCOUNTER — Inpatient Hospital Stay: Payer: Medicare Other | Attending: Oncology | Admitting: Oncology

## 2017-11-10 ENCOUNTER — Inpatient Hospital Stay: Payer: Medicare Other

## 2017-11-10 ENCOUNTER — Encounter: Payer: Self-pay | Admitting: Oncology

## 2017-11-10 VITALS — BP 131/69 | HR 112 | Temp 98.6°F | Resp 18 | Ht 66.0 in | Wt 195.9 lb

## 2017-11-10 DIAGNOSIS — D0500 Lobular carcinoma in situ of unspecified breast: Secondary | ICD-10-CM | POA: Insufficient documentation

## 2017-11-10 DIAGNOSIS — Z1231 Encounter for screening mammogram for malignant neoplasm of breast: Secondary | ICD-10-CM

## 2017-11-10 DIAGNOSIS — Z79811 Long term (current) use of aromatase inhibitors: Secondary | ICD-10-CM

## 2017-11-10 DIAGNOSIS — G629 Polyneuropathy, unspecified: Secondary | ICD-10-CM | POA: Diagnosis not present

## 2017-11-10 DIAGNOSIS — Z8049 Family history of malignant neoplasm of other genital organs: Secondary | ICD-10-CM

## 2017-11-10 DIAGNOSIS — I480 Paroxysmal atrial fibrillation: Secondary | ICD-10-CM

## 2017-11-10 DIAGNOSIS — F17210 Nicotine dependence, cigarettes, uncomplicated: Secondary | ICD-10-CM | POA: Diagnosis not present

## 2017-11-10 DIAGNOSIS — N183 Chronic kidney disease, stage 3 unspecified: Secondary | ICD-10-CM

## 2017-11-10 DIAGNOSIS — E119 Type 2 diabetes mellitus without complications: Secondary | ICD-10-CM | POA: Diagnosis not present

## 2017-11-10 DIAGNOSIS — Z803 Family history of malignant neoplasm of breast: Secondary | ICD-10-CM | POA: Insufficient documentation

## 2017-11-10 DIAGNOSIS — I10 Essential (primary) hypertension: Secondary | ICD-10-CM

## 2017-11-10 DIAGNOSIS — K219 Gastro-esophageal reflux disease without esophagitis: Secondary | ICD-10-CM

## 2017-11-10 DIAGNOSIS — D0501 Lobular carcinoma in situ of right breast: Secondary | ICD-10-CM | POA: Insufficient documentation

## 2017-11-10 DIAGNOSIS — C50919 Malignant neoplasm of unspecified site of unspecified female breast: Secondary | ICD-10-CM

## 2017-11-10 DIAGNOSIS — Z79899 Other long term (current) drug therapy: Secondary | ICD-10-CM | POA: Insufficient documentation

## 2017-11-10 DIAGNOSIS — E0822 Diabetes mellitus due to underlying condition with diabetic chronic kidney disease: Secondary | ICD-10-CM

## 2017-11-10 DIAGNOSIS — Z7982 Long term (current) use of aspirin: Secondary | ICD-10-CM | POA: Diagnosis not present

## 2017-11-10 DIAGNOSIS — Z13820 Encounter for screening for osteoporosis: Secondary | ICD-10-CM | POA: Insufficient documentation

## 2017-11-10 DIAGNOSIS — N6091 Unspecified benign mammary dysplasia of right breast: Secondary | ICD-10-CM

## 2017-11-10 DIAGNOSIS — E559 Vitamin D deficiency, unspecified: Secondary | ICD-10-CM

## 2017-11-10 DIAGNOSIS — Z7984 Long term (current) use of oral hypoglycemic drugs: Secondary | ICD-10-CM | POA: Diagnosis not present

## 2017-11-10 DIAGNOSIS — E0841 Diabetes mellitus due to underlying condition with diabetic mononeuropathy: Secondary | ICD-10-CM

## 2017-11-10 LAB — CMP (CANCER CENTER ONLY)
ALT: 10 U/L (ref 0–55)
AST: 10 U/L (ref 5–34)
Albumin: 3.1 g/dL — ABNORMAL LOW (ref 3.5–5.0)
Alkaline Phosphatase: 81 U/L (ref 40–150)
Anion gap: 13 — ABNORMAL HIGH (ref 3–11)
BUN: 11 mg/dL (ref 7–26)
CO2: 22 mmol/L (ref 22–29)
Calcium: 9.4 mg/dL (ref 8.4–10.4)
Chloride: 102 mmol/L (ref 98–109)
Creatinine: 1.2 mg/dL — ABNORMAL HIGH (ref 0.60–1.10)
GFR, Est AFR Am: 52 mL/min — ABNORMAL LOW (ref >60)
GFR, Estimated: 45 mL/min — ABNORMAL LOW (ref >60)
Glucose, Bld: 192 mg/dL — ABNORMAL HIGH (ref 70–140)
Potassium: 3.6 mmol/L (ref 3.5–5.1)
Sodium: 137 mmol/L (ref 136–145)
Total Bilirubin: 0.3 mg/dL (ref 0.2–1.2)
Total Protein: 6.8 g/dL (ref 6.4–8.3)

## 2017-11-10 LAB — CBC WITH DIFFERENTIAL (CANCER CENTER ONLY)
Basophils Absolute: 0 10*3/uL (ref 0.0–0.1)
Basophils Relative: 1 %
Eosinophils Absolute: 0.2 10*3/uL (ref 0.0–0.5)
Eosinophils Relative: 2 %
HCT: 35.4 % (ref 34.8–46.6)
Hemoglobin: 11.7 g/dL (ref 11.6–15.9)
Lymphocytes Relative: 27 %
Lymphs Abs: 2 10*3/uL (ref 0.9–3.3)
MCH: 31 pg (ref 25.1–34.0)
MCHC: 33.1 g/dL (ref 31.5–36.0)
MCV: 93.7 fL (ref 79.5–101.0)
Monocytes Absolute: 0.7 10*3/uL (ref 0.1–0.9)
Monocytes Relative: 9 %
Neutro Abs: 4.7 10*3/uL (ref 1.5–6.5)
Neutrophils Relative %: 61 %
Platelet Count: 235 10*3/uL (ref 145–400)
RBC: 3.78 MIL/uL (ref 3.70–5.45)
RDW: 15.5 % — ABNORMAL HIGH (ref 11.2–14.5)
WBC Count: 7.6 10*3/uL (ref 3.9–10.3)

## 2017-11-10 MED ORDER — ANASTROZOLE 1 MG PO TABS
1.0000 mg | ORAL_TABLET | Freq: Every day | ORAL | 4 refills | Status: DC
Start: 2017-11-10 — End: 2018-11-22

## 2017-11-10 NOTE — Progress Notes (Signed)
[facility]  Telephone:([phone]) [phone] [phone]) [phone]     ID: Melanie [name] DOB: [birth year]  MR#: [id number]  XTG#:[id number]  Patient Care Team: Marton Redwood, MD as PCP - General (Internal Medicine) [name], [name], MD as Consulting Physician (Oncology) Coralie Keens, MD as Consulting Physician (General Surgery) [name], DO as Consulting Physician (Neurology) Paula Compton, MD as Consulting Physician (Obstetrics and Gynecology) [name], MD as Consulting Physician (Orthopedic Surgery) OTHER MD:   CHIEF COMPLAINT: Atypical ductal hyperplasia, lobular carcinoma in situ  CURRENT TREATMENT: Anastrozole, intensified screening   HISTORY OF CURRENT ILLNESS: Melanie [name] had routine screening mammography on 09/22/2017 showing a possible abnormality in the right breast. She underwent bilateral diagnostic mammography with tomography and right breast ultrasonography at [facility] on 09/25/2017 showing: Indeterminate right breast mass in the central lateral right breast at middle depth. identified mammographically without associated ultrasound correlate. Evaluation of the right axilla demonstrates no suspicious adenopathy.   Accordingly on 10/01/2017 she proceeded to biopsy of the right breast area in question. 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 Fibrocystic changes.   On 10/27/2017, she underwent right breast lumpectomy showing ([id number]) focal atypical ductal hyperplasia and sclerosing adenosis. Fibroadenoma. Previous biopsy site and biopsy clip. No residual lobular carcinoma in situ. No invasive carcinoma.   The patient's subsequent history is as detailed below.  INTERVAL HISTORY: Melanie [name] was evaluated in the high risk breast cancer clinic on 11/10/2017 .    REVIEW OF SYSTEMS: Melanie [name] reports that she has peripheral neuropathy in her legs that mainly occur in the  afternoon. She notes that her fingers hurt and are stiff in the mornings.  She notes that she has HTN and diabetes. She notes that she has occasional GERD. There were no specific symptoms leading to the original mammogram, which was routinely scheduled. The patient denies unusual headaches, visual changes, nausea, vomiting, stiff neck, dizziness, or gait imbalance. There has been no cough, phlegm production, or pleurisy, no chest pain or pressure, and no change in bowel or bladder habits. The patient denies fever, rash, bleeding, unexplained fatigue or unexplained weight loss. A detailed review of systems was otherwise entirely negative.   PAST MEDICAL HISTORY: Past Medical History:  Diagnosis Date  . Arthritis   . Bradycardia   . Depression    under control  . DM (diabetes mellitus) ([facility])    type 2  . Dyspnea    with some exertion  . Family history of anesthesia complication    mother-nausea and vomiting  . GERD (gastroesophageal reflux disease)    occasional  . Headache   . HTN (hypertension)   . Neuropathy    legs and feet  . Neuropathy    feet  . PAF (paroxysmal atrial fibrillation) ([facility]) 10 years ago   no problems since, due to stress  . Situational stress    "was taking care of sick husband"  stress related heart palpitations in 1998  PAST SURGICAL HISTORY: Past Surgical History:  Procedure Laterality Date  . ABDOMINAL HYSTERECTOMY  1980   partial  . APPENDECTOMY  1980  . BACK SURGERY  2004  . BREAST LUMPECTOMY WITH RADIOACTIVE SEED LOCALIZATION Right 10/27/2017   Procedure: RIGHT BREAST PARTIAL MASTECTIOMY WITH RADIOACTIVE SEED LOCALIZATION ERAS PATHWAY;  Surgeon: Coralie Keens, MD;  Location: [facility];  Service: General;  Laterality: Right;  . CARDIAC CATHETERIZATION     in the late 90's/early 2000's  . CARPAL  TUNNEL RELEASE Bilateral 2003, 2004  . CHOLECYSTECTOMY N/A 12/28/2014   Procedure: LAPAROSCOPIC CHOLECYSTECTOMY WITH INTRAOPERATIVE CHOLANGIOGRAM;  Surgeon:  Armandina Gemma, MD;  Location: [facility];  Service: General;  Laterality: N/A;  . COLONOSCOPY  8 years ago  . JOINT REPLACEMENT Bilateral 2007  . KNEE ARTHROSCOPY Bilateral 1990's  . LAMINECTOMY  11/28/2016   L 3   . LUMBAR LAMINECTOMY N/A 07/20/2013   Procedure: MICRODISCECTOMY LUMBAR LAMINECTOMY;  Surgeon: [name], MD;  Location: [facility];  Service: Orthopedics;  Laterality: N/A;  L2-3 Decompression  . LUMBAR LAMINECTOMY/DECOMPRESSION MICRODISCECTOMY N/A 11/28/2016   Procedure: L3 LAMINECTOMY, PARTIAL LAMINECTOMY WITH REMOVAL OF FREE FRAGMENT LEFT L3-4;  Surgeon: [name], MD;  Location: [facility];  Service: Orthopedics;  Laterality: N/A;  . LUMBAR LAMINECTOMY/DECOMPRESSION MICRODISCECTOMY N/A 02/18/2017   Procedure: L3-4 MICRODISCECTOMY;  Surgeon: [name], MD;  Location: [facility];  Service: Orthopedics;  Laterality: N/A;    FAMILY HISTORY Family History  Problem Relation Age of Onset  . Heart attack Father        Deceased, 67  . Other Mother        Deceased, 51  . Neuropathy Mother   . Hypertension Mother   . Breast cancer Sister        early 64's  . Ovarian cancer Sister        Deceased, 86  . Neuropathy Sister   . Multiple sclerosis Sister   . Stroke Sister   . Hypertension Sister   The patient's father died of a heart attack at age 59. The patient's mother died due to old age at 39.  The patient has no brothers and 3 sisters. The patient's youngest sister had breast cancer and passed away 5 years later due to ovarian cancer, which was diagnosed in her early 42's.  The patient denies any other family members with  breast or ovarian cancer.   GYNECOLOGIC HISTORY:  Menarche: 72 years old Age at first live birth: 72 years old [place] P2 LMP: status post hysterectomy without BSO at age 64 She used contraceptives for about 2 years. She did not use HRTs.  SOCIAL HISTORY:  Melanie [name] is now retired from working in a [employer] in [place]. She now takes care of her husband,  Melanie [name], who has been disabled with neck issues for the last 20 years. Melanie [name] used to work in [employer] and was injured on the job. At home is her husband, 2 dogs, and a cat. The patient's oldest son, Melanie [name], works for the [employer] and lives in [place], [place].  The patient's youngest son, Melanie [name], works at [employer] in [place]. The patient has 3 grandchildren. She attends [employer].       ADVANCED DIRECTIVES:   HEALTH MAINTENANCE: Social History   Tobacco Use  . Smoking status: Current Every Day Smoker    Packs/day: 0.50    Years: 30.00    Pack years: 15.00    Types: Cigarettes  . Smokeless tobacco: Never Used  Substance Use Topics  . Alcohol use: No    Alcohol/week: 0.0 oz  . Drug use: No     Colonoscopy: 10 years ago at [facility]/ normal  PAP: s/p hysterctomy  Bone density: at Dr. Brigitte Pulse, normal per patient's report   Allergies  Allergen Reactions  . Fenofibrate Other (See Comments)    "muscle spasms"  . Metoprolol Other (See Comments)    Headache    Current Outpatient Medications  Medication  Sig Dispense Refill  . aspirin EC 81 MG tablet Take 81 mg by mouth daily.     . bisoprolol-hydrochlorothiazide (ZIAC) 5-6.25 MG tablet Take 1 tablet by mouth 2 (two) times daily. Patient overdue for an appointment and must call and schedule for further refills 2nd attempt 30 tablet 0  . gabapentin (NEURONTIN) 300 MG capsule Take 3 capsules (900 mg total) by mouth 3 (three) times daily. 810 capsule 3  . glipiZIDE (GLUCOTROL) 5 MG tablet Take 5 mg by mouth 2 (two) times daily before a meal.     . lisinopril (PRINIVIL,ZESTRIL) 20 MG tablet TAKE ONE TABLET BY MOUTH ONCE DAILY 90 tablet 3  . metFORMIN (GLUCOPHAGE) 1000 MG tablet Take 1,000 mg by mouth 2 (two) times daily.    . nortriptyline (PAMELOR) 10 MG capsule Take 4 capsules (40 mg total) by mouth at bedtime. 360 capsule 3  . Omega 3 1000 MG CAPS Take 1,000 mg by mouth daily.    . sertraline (ZOLOFT) 50  MG tablet Take 50 mg by mouth at bedtime.      No current facility-administered medications for this visit.     OBJECTIVE: Middle-aged white woman who appears older than stated age  65:   11/10/17 1520  BP: 131/69  Pulse: (!) 112  Resp: 18  Temp: 98.6 F (37 C)  SpO2: 100%     Body mass index is 31.62 kg/m.   Wt Readings from Last 3 Encounters:  11/10/17 195 lb 14.4 oz (88.9 kg)  10/27/17 190 lb (86.2 kg)  10/21/17 192 lb (87.1 kg)      ECOG FS:1 - Symptomatic but completely ambulatory  Ocular: Sclerae unicteric, pupils round and equal Lymphatic: No cervical or supraclavicular adenopathy Lungs no rales or rhonchi Heart regular rate and rhythm Abd soft, nontender, positive bowel sounds MSK status post prior spinal surgery but no focal spinal tenderness, and show significant osteoarthritic changes Neuro: non-focal, well-oriented, positive affect Breasts: The right breast is status post lumpectomy.  The incision is healing well.  There is minimal underlying induration.  There is no erythema, swelling, or dehiscence.  The left breast is benign.  Both axillae are benign   LAB RESULTS:  CMP     Component Value Date/Time   NA 137 11/10/2017 1504   K 3.6 11/10/2017 1504   CL 102 11/10/2017 1504   CO2 22 11/10/2017 1504   GLUCOSE 192 (H) 11/10/2017 1504   BUN 11 11/10/2017 1504   CREATININE 1.20 (H) 11/10/2017 1504   CALCIUM 9.4 11/10/2017 1504   PROT 6.8 11/10/2017 1504   ALBUMIN 3.1 (L) 11/10/2017 1504   AST 10 11/10/2017 1504   ALT 10 11/10/2017 1504   ALKPHOS 81 11/10/2017 1504   BILITOT 0.3 11/10/2017 1504   GFRNONAA 45 (L) 11/10/2017 1504   GFRAA 52 (L) 11/10/2017 1504    Lab Results  Component Value Date   TOTALPROTELP 7.4 09/22/2013   ALBUMINELP 61.6 09/22/2013   A1GS 3.4 09/22/2013   A2GS 11.5 09/22/2013   BETS 7.1 09/22/2013   BETA2SER 4.2 09/22/2013   GAMS 12.2 09/22/2013   MSPIKE NOT DET 09/22/2013   SPEI  09/22/2013     Comment:     The  possibility of a faint restricted band(s) cannot be completely excluded in the gamma region.  Suggest serum IFE to evaluate possibility, if clinically indicated. (Lab will hold sample one week. Please call Customer Service at [phone] to add test.) Reviewed by Odis Hollingshead, MD, PhD, Ian Malkin (  Electronic Signature on File)     Lab Results  Component Value Date   KAPLAMBRATIO 8.29 09/22/2013    Lab Results  Component Value Date   WBC 7.6 11/10/2017   NEUTROABS 4.7 11/10/2017   HGB 12.6 10/21/2017   HCT 35.4 11/10/2017   MCV 93.7 11/10/2017   PLT 235 11/10/2017    '@LASTCHEMISTRY' @  No results found for: LABCA2  No components found for: IRWERX540  No results for input(s): INR in the last 168 hours.  No results found for: LABCA2  No results found for: GQQ761  No results found for: PJK932  No results found for: IZT245  No results found for: CA2729  No components found for: HGQUANT  No results found for: CEA1 / No results found for: CEA1   No results found for: AFPTUMOR  No results found for: CHROMOGRNA  No results found for: PSA1  Appointment on 11/10/2017  Component Date Value Ref Range Status  . WBC Count 11/10/2017 7.6  3.9 - 10.3 K/uL Final  . RBC 11/10/2017 3.78  3.70 - 5.45 MIL/uL Final  . Hemoglobin 11/10/2017 11.7  11.6 - 15.9 g/dL Final  . HCT 11/10/2017 35.4  34.8 - 46.6 % Final  . MCV 11/10/2017 93.7  79.5 - 101.0 fL Final  . MCH 11/10/2017 31.0  25.1 - 34.0 pg Final  . MCHC 11/10/2017 33.1  31.5 - 36.0 g/dL Final  . RDW 11/10/2017 15.5* 11.2 - 14.5 % Final  . Platelet Count 11/10/2017 235  145 - 400 K/uL Final  . Neutrophils Relative % 11/10/2017 61  % Final  . Neutro Abs 11/10/2017 4.7  1.5 - 6.5 K/uL Final  . Lymphocytes Relative 11/10/2017 27  % Final  . Lymphs Abs 11/10/2017 2.0  0.9 - 3.3 K/uL Final  . Monocytes Relative 11/10/2017 9  % Final  . Monocytes Absolute 11/10/2017 0.7  0.1 - 0.9 K/uL Final  . Eosinophils Relative  11/10/2017 2  % Final  . Eosinophils Absolute 11/10/2017 0.2  0.0 - 0.5 K/uL Final  . Basophils Relative 11/10/2017 1  % Final  . Basophils Absolute 11/10/2017 0.0  0.0 - 0.1 K/uL Final   Performed at [facility], [place] [address] [place], [place] [address]  . Sodium 11/10/2017 137  136 - 145 mmol/L Final  . Potassium 11/10/2017 3.6  3.5 - 5.1 mmol/L Final  . Chloride 11/10/2017 102  98 - 109 mmol/L Final  . CO2 11/10/2017 22  22 - 29 mmol/L Final  . Glucose, Bld 11/10/2017 192* 70 - 140 mg/dL Final  . BUN 11/10/2017 11  7 - 26 mg/dL Final  . Creatinine 11/10/2017 1.20* 0.60 - 1.10 mg/dL Final  . Calcium 11/10/2017 9.4  8.4 - 10.4 mg/dL Final  . Total Protein 11/10/2017 6.8  6.4 - 8.3 g/dL Final  . Albumin 11/10/2017 3.1* 3.5 - 5.0 g/dL Final  . AST 11/10/2017 10  5 - 34 U/L Final  . ALT 11/10/2017 10  0 - 55 U/L Final  . Alkaline Phosphatase 11/10/2017 81  40 - 150 U/L Final  . Total Bilirubin 11/10/2017 0.3  0.2 - 1.2 mg/dL Final  . GFR, Est Non Af Am 11/10/2017 45* >60 mL/min Final  . GFR, Est AFR Am 11/10/2017 52* >60 mL/min Final   Comment: (NOTE) The eGFR has been calculated using the CKD EPI equation. This calculation has not been validated in all clinical situations. eGFR's persistently <60 mL/min signify possible Chronic Kidney Disease.   Marland Kitchen  Anion gap 11/10/2017 13* 3 - 11 Final   Performed at [facility], [place] [address] [place],  [address]    (this displays the last labs from the last 3 days)  Lab Results  Component Value Date   TOTALPROTELP 7.4 09/22/2013   ALBUMINELP 61.6 09/22/2013   A1GS 3.4 09/22/2013   A2GS 11.5 09/22/2013   BETS 7.1 09/22/2013   BETA2SER 4.2 09/22/2013   GAMS 12.2 09/22/2013   MSPIKE NOT DET 09/22/2013   SPEI  09/22/2013     Comment:     The possibility of a faint restricted band(s) cannot be completely excluded in the gamma region.  Suggest serum IFE to evaluate possibility, if  clinically indicated. (Lab will hold sample one week. Please call Customer Service at [phone] to add test.) Reviewed by Odis Hollingshead, MD, PhD, FCAP (Electronic Signature on File)   (this displays SPEP labs)  Lab Results  Component Value Date   KAPLAMBRATIO 8.29 09/22/2013   (kappa/lambda light chains)  No results found for: HGBA, HGBA2QUANT, HGBFQUANT, HGBSQUAN (Hemoglobinopathy evaluation)   No results found for: LDH  No results found for: IRON, TIBC, IRONPCTSAT (Iron and TIBC)  No results found for: FERRITIN  Urinalysis    Component Value Date/Time   COLORURINE YELLOW 06/05/2017 0052   APPEARANCEUR HAZY (A) 06/05/2017 0052   LABSPEC 1.015 06/05/2017 0052   PHURINE 5.0 06/05/2017 0052   GLUCOSEU NEGATIVE 06/05/2017 0052   HGBUR NEGATIVE 06/05/2017 0052   BILIRUBINUR NEGATIVE 06/05/2017 0052   KETONESUR NEGATIVE 06/05/2017 0052   PROTEINUR NEGATIVE 06/05/2017 0052   NITRITE NEGATIVE 06/05/2017 0052   LEUKOCYTESUR MODERATE (A) 06/05/2017 0052     STUDIES: Mm Breast Surgical Specimen  Result Date: 10/27/2017 CLINICAL DATA:  Status post surgical excision of a right breast lesion following radioactive seed localization. EXAM: SPECIMEN RADIOGRAPH OF THE RIGHT BREAST COMPARISON:  Previous exam(s). FINDINGS: Status post excision of the right breast. The radioactive seed and biopsy marker clip are present, completely intact, and were marked for pathology. IMPRESSION: Specimen radiograph of the right breast. Electronically Signed   By: Lajean Manes M.D.   On: 10/27/2017 09:46   Mm Rt Radioactive Seed Loc Mammo Guide  Result Date: 10/26/2017 CLINICAL DATA:  72 year old female for radioactive seed localization of right breast LCIS. EXAM: MAMMOGRAPHIC GUIDED RADIOACTIVE SEED LOCALIZATION OF THE RIGHT BREAST COMPARISON:  Previous exam(s). FINDINGS: Patient presents for radioactive seed localization prior to right breast surgical excision. I met with the patient and we  discussed the procedure of seed localization including benefits and alternatives. We discussed the high likelihood of a successful procedure. We discussed the risks of the procedure including infection, bleeding, tissue injury and further surgery. We discussed the low dose of radioactivity involved in the procedure. Informed, written consent was given. The usual time-out protocol was performed immediately prior to the procedure. Using mammographic guidance, sterile technique, 1% lidocaine and an I-125 radioactive seed, the coil shaped clip was localized using a lateral approach. The follow-up mammogram images confirm the seed in the expected location and were marked for Dr. Ninfa Linden. Follow-up survey of the patient confirms presence of the radioactive seed. Order number of I-125 seed:  [id number]. Total activity:  0.100 millicurie reference Date: 10/19/2017 The patient tolerated the procedure well and was released from the [facility]. She was given instructions regarding seed removal. IMPRESSION: Radioactive seed localization right breast. No apparent complications. Electronically Signed   By: Margarette Canada M.D.   On: 10/26/2017  14:31    ELIGIBLE FOR AVAILABLE RESEARCH PROTOCOL: no  ASSESSMENT: 72 y.o. [place] woman status post right breast biopsy 10/01/2017 showing lobular carcinoma in situ and atypical ductal hyperplasia  (1) right lumpectomy 10/27/2017 shows focal atypical ductal hyperplasia  (2) additional cancer risk factors:   (a) family history of breast and ovarian cancer  (b) breast density category C  PLAN: (3) anastrozole started 11/09/2017  (a) baseline bone density reportedly normal  (4) breast MRI scheduled for July 2019  (5) genetics testing pending  DISCUSSION:  We spent the better part of today's 50-minute appointment discussing the biology of breast cancer in general, and the specifics of atypical ductal hyperplasia [ADH] more specifically. Melanie [name] understands  ADH means, first, relatively unrestricted growth of breast cells and, in addition, some morphologically different features from simple hyperplasia. Atypical ductal hyperplasia can be difficult to tell apart ductal cancer in situ. For those reasons an ADH lesion needs to be removed.  ADH is also a marker of breast cancer risk. The risk of developing breast cancer in patients with ADH falls somewhere between one half and 1% per year. The new cancer can develop in either breast. One half of those tumors would be invasive.  There are in general 2 approaches to dealing with this risk.  One is risk reduction measures.  The other one is intensified screening  Melanie [name] has essentially two ways of lowering her risk of developing breast cancer. One of them is bilateral mastectomies. This is not recommended for this indication and I do not believe it would be covered by insurance even if she wanted it which she doesn't. The other, more reasonable approach would be to take anti-estrogens. This could be tamoxifen, raloxifene/Evista, or an aromatase inhibitor.  We then discussed the possible toxicities, side effects and complications of the tamoxifen group as opposed to the aromatase inhibitors. All information was given to Melanie [name] in writing. Any of those agents if taken for 5 years would essentially cut the risk of developing a new breast cancer in half.   She is a good candidate for tamoxifen since she is status post hysterectomy, but she is concerned about the risk of blood clots.  She is also a good candidate for aromatase inhibitors since he has a baseline normal bone density.  In the end she decided she would give anastrozole  We then discussed intensified screening. This could be as simple as having MD breast exams twice yearly and making sure she has tomography/3D with mammography.  She can also consider adding yearly MRI to yearly mammography with tomography. This approach greatly increases sensitivity. Any  breast cancer that may develop should be found at the earliest possible stage. However there are issues regarding cost even if insurance coverage is obtained. There also concerns regarding false positives especially in the first or second year of MRI screening.  Dian has a good understanding of all these options.  She is very interested in obtaining at least a baseline MRI.  The optimal time for that would be 6 months after her mammography so we will be scheduling this for July of this year.  Finally she qualifies for genetics testing.  I will set her up with 1 of our genetics counselors for further discussion.  In short, she will start anastrozole, get genetics testing done, and return to see me in about 3-4 months to make sure she is tolerating anastrozole well and to clarify any questions she may have.Marland Kitchen  She will then  have her breast MRI in July.  Depending on those results we can decide whether she will need further follow-up here or whether we can release her to her primary care physician Dr. Brigitte Pulse and gynecologist Dr. Marvel Plan.  Mackenzie has a good understanding of the overall plan. She agrees with it. She knows the goal of treatment in her case is prevention. She will call with any problems that may develop before her next visit here.  Amaura Authier, [name], MD  11/10/17 3:56 PM Medical Oncology and Hematology [facility] [address] [place], [place] [address] Tel. [phone]    Fax. [phone]    This document serves as a record of services personally performed by Lurline Del, MD. It was created on his behalf by Sheron Nightingale, a trained medical scribe. The creation of this record is based on the scribe's personal observations and the provider's statements to them.   I have reviewed the above documentation for accuracy and completeness, and I agree with the above.

## 2017-11-17 DIAGNOSIS — E538 Deficiency of other specified B group vitamins: Secondary | ICD-10-CM | POA: Diagnosis not present

## 2017-11-23 ENCOUNTER — Other Ambulatory Visit: Payer: Self-pay | Admitting: Cardiovascular Disease

## 2017-11-26 ENCOUNTER — Other Ambulatory Visit: Payer: Self-pay | Admitting: *Deleted

## 2017-11-26 ENCOUNTER — Other Ambulatory Visit: Payer: Self-pay | Admitting: Cardiovascular Disease

## 2017-11-26 MED ORDER — BISOPROLOL-HYDROCHLOROTHIAZIDE 5-6.25 MG PO TABS: 1.0000 | ORAL_TABLET | Freq: Two times a day (BID) | ORAL | 0 refills | Status: DC

## 2017-11-26 NOTE — Telephone Encounter (Signed)
Pt called requesting a 90 day supply. Pt's medication was sent to pt's pharmacy as requested. Confirmation received.

## 2017-11-26 NOTE — Telephone Encounter (Signed)
New message     *STAT* If patient is at the pharmacy, call can be transferred to refill team.   1. Which medications need to be refilled? (please list name of each medication and dose if known) bisoprolol-hydrochlorothiazide (ZIAC) 5-6.25 MG tablet  2. Which pharmacy/location (including street and city if local pharmacy) is medication to be sent to?[facility] [place] [address]  3. Do they need a 30 day or 90 day supply?[place]

## 2017-11-28 ENCOUNTER — Ambulatory Visit
Admission: RE | Admit: 2017-11-28 | Discharge: 2017-11-28 | Disposition: A | Discharge status: Home / Self Care | Payer: Medicare Other | Source: Ambulatory Visit | Attending: Oncology | Admitting: Oncology

## 2017-11-28 DIAGNOSIS — Z1231 Encounter for screening mammogram for malignant neoplasm of breast: Secondary | ICD-10-CM

## 2017-11-28 DIAGNOSIS — N6489 Other specified disorders of breast: Secondary | ICD-10-CM | POA: Diagnosis not present

## 2017-11-28 MED ORDER — GADOBENATE DIMEGLUMINE 529 MG/ML IV SOLN
18.0000 mL | Freq: Once | INTRAVENOUS | Status: AC | PRN
  Administered 2017-11-28: 18 mL via INTRAVENOUS

## 2017-11-30 ENCOUNTER — Encounter: Payer: Self-pay | Admitting: Oncology

## 2017-12-01 ENCOUNTER — Other Ambulatory Visit: Payer: Self-pay | Admitting: Cardiovascular Disease

## 2017-12-04 DIAGNOSIS — Z4789 Encounter for other orthopedic aftercare: Secondary | ICD-10-CM | POA: Diagnosis not present

## 2017-12-09 ENCOUNTER — Telehealth: Payer: Self-pay

## 2017-12-09 NOTE — Telephone Encounter (Signed)
Spoke with patient concerning r/s her gen. Appointment. Per 3/27 am phone message return.

## 2017-12-17 ENCOUNTER — Encounter: Payer: Medicare Other | Admitting: Genetics

## 2017-12-17 ENCOUNTER — Other Ambulatory Visit: Payer: Medicare Other

## 2017-12-22 ENCOUNTER — Ambulatory Visit: Payer: Medicare Other | Admitting: Physician Assistant

## 2017-12-23 ENCOUNTER — Encounter: Payer: Self-pay | Admitting: Physician Assistant

## 2017-12-24 ENCOUNTER — Other Ambulatory Visit: Payer: Self-pay | Admitting: Cardiovascular Disease

## 2017-12-24 ENCOUNTER — Other Ambulatory Visit: Payer: Self-pay | Admitting: *Deleted

## 2017-12-24 MED ORDER — BISOPROLOL-HYDROCHLOROTHIAZIDE 5-6.25 MG PO TABS: 1.0000 | ORAL_TABLET | Freq: Two times a day (BID) | ORAL | 0 refills | Status: DC

## 2017-12-24 NOTE — Telephone Encounter (Signed)
Received fax from [facility] indicating that the rx that they received for bisoprolol-hctz was only for a quantity of 60 not 180. I will send another rx for #60 as patient has an upcoming appointment.

## 2017-12-24 NOTE — Telephone Encounter (Signed)
Outpatient Medication Detail    Disp Refills Start End   bisoprolol-hydrochlorothiazide [facility]) 5-6.25 MG tablet 180 tablet 0 11/26/2017    Sig - Route: Take 1 tablet by mouth 2 (two) times daily. Please keep upcoming appointment for further refills - Oral   Sent to pharmacy as: bisoprolol-hydrochlorothiazide (ZIAC) 5-6.25 MG tablet   E-Prescribing Status: Receipt confirmed by pharmacy (11/26/2017 3:42 PM EDT)   Pharmacy   [facility], [place]

## 2017-12-29 ENCOUNTER — Encounter: Payer: Self-pay | Admitting: Physician Assistant

## 2017-12-29 ENCOUNTER — Ambulatory Visit (INDEPENDENT_AMBULATORY_CARE_PROVIDER_SITE_OTHER): Payer: Medicare Other | Admitting: Physician Assistant

## 2017-12-29 VITALS — BP 130/76 | HR 88 | Ht 67.0 in | Wt 195.0 lb

## 2017-12-29 DIAGNOSIS — I480 Paroxysmal atrial fibrillation: Secondary | ICD-10-CM

## 2017-12-29 DIAGNOSIS — I10 Essential (primary) hypertension: Secondary | ICD-10-CM | POA: Diagnosis not present

## 2017-12-29 DIAGNOSIS — Z720 Tobacco use: Secondary | ICD-10-CM | POA: Diagnosis not present

## 2017-12-29 DIAGNOSIS — I209 Angina pectoris, unspecified: Secondary | ICD-10-CM

## 2017-12-29 DIAGNOSIS — E118 Type 2 diabetes mellitus with unspecified complications: Secondary | ICD-10-CM

## 2017-12-29 DIAGNOSIS — E538 Deficiency of other specified B group vitamins: Secondary | ICD-10-CM | POA: Diagnosis not present

## 2017-12-29 DIAGNOSIS — E785 Hyperlipidemia, unspecified: Secondary | ICD-10-CM

## 2017-12-29 MED ORDER — LISINOPRIL 20 MG PO TABS: 20.0000 mg | ORAL_TABLET | Freq: Every day | ORAL | 3 refills | Status: DC

## 2017-12-29 MED ORDER — NITROGLYCERIN 0.4 MG SL SUBL: 0.4000 mg | SUBLINGUAL_TABLET | SUBLINGUAL | 3 refills | Status: DC | PRN

## 2017-12-29 MED ORDER — EZETIMIBE 10 MG PO TABS: 10.0000 mg | ORAL_TABLET | Freq: Every day | ORAL | 3 refills | Status: DC

## 2017-12-29 MED ORDER — ISOSORBIDE MONONITRATE ER 30 MG PO TB24: 30.0000 mg | ORAL_TABLET | Freq: Every day | ORAL | 3 refills | Status: DC
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
## 2017-12-29 NOTE — Patient Instructions (Signed)
Medication Instructions:  1. START IMDUR 30 MG DAILY; RX HAS BEEN SENT IN  2. START ZETIA 10 MG DAILY; RX HAS BEEN SENT IN  3. NITROGLYCERIN HAS BEEN SENT IN AND YOU HAVE BEEN ADVISED AS TO HOW AND WHEN TO USE NTG  Labwork: FASTING LIPID AND LIVER PANEL TO BE DONE IN 6 WEEKS   Testing/Procedures: Your physician has requested that you have an echocardiogram. Echocardiography is a painless test that uses sound waves to create images of your heart. It provides your doctor with information about the size and shape of your heart and how well your heart's chambers and valves are working. This procedure takes approximately one hour. There are no restrictions for this procedure.    Follow-Up: [employer], PAC IN 6 WEEKS SAME DAY DR, [facility] IS IN THE OFFICE   Any Other Special Instructions Will Be Listed Below (If Applicable).     If you need a refill on your cardiac medications before your next appointment, please call your pharmacy.

## 2017-12-31 DIAGNOSIS — Z1212 Encounter for screening for malignant neoplasm of rectum: Secondary | ICD-10-CM | POA: Diagnosis not present

## 2017-12-31 DIAGNOSIS — Z1211 Encounter for screening for malignant neoplasm of colon: Secondary | ICD-10-CM | POA: Diagnosis not present

## 2018-01-05 ENCOUNTER — Other Ambulatory Visit: Payer: Self-pay

## 2018-01-05 ENCOUNTER — Ambulatory Visit (HOSPITAL_COMMUNITY): Payer: Medicare Other | Attending: Cardiovascular Disease

## 2018-01-05 VITALS — BP 105/59

## 2018-01-05 DIAGNOSIS — R0600 Dyspnea, unspecified: Secondary | ICD-10-CM | POA: Diagnosis not present

## 2018-01-05 DIAGNOSIS — I4891 Unspecified atrial fibrillation: Secondary | ICD-10-CM | POA: Diagnosis not present

## 2018-01-05 DIAGNOSIS — I119 Hypertensive heart disease without heart failure: Secondary | ICD-10-CM | POA: Insufficient documentation

## 2018-01-05 DIAGNOSIS — F17210 Nicotine dependence, cigarettes, uncomplicated: Secondary | ICD-10-CM | POA: Diagnosis not present

## 2018-01-05 DIAGNOSIS — E119 Type 2 diabetes mellitus without complications: Secondary | ICD-10-CM | POA: Diagnosis not present

## 2018-01-05 DIAGNOSIS — I209 Angina pectoris, unspecified: Secondary | ICD-10-CM | POA: Insufficient documentation

## 2018-01-05 MED ORDER — PERFLUTREN LIPID MICROSPHERE
1.0000 mL | INTRAVENOUS | Status: AC | PRN
  Administered 2018-01-05: 1 mL via INTRAVENOUS

## 2018-01-11 ENCOUNTER — Telehealth: Payer: Self-pay | Admitting: Physician Assistant

## 2018-01-11 NOTE — Telephone Encounter (Signed)
New message:       Pt c/o medication issue:  1. Name of Medication: ezetimibe (ZETIA) 10 MG tablet  2. How are you currently taking this medication (dosage and times per day)? N/A  3. Are you having a reaction (difficulty breathing--STAT)? No  4. What is your medication issue? Pt states that Kathlen Mody told her if this medication was too expensive then do not get it. So pt states it is too expensive and she also states he told her to cancel her lab work if she did not get this medication. Pls clarify this with pt.

## 2018-01-11 NOTE — Telephone Encounter (Signed)
Ok to cancel upcoming fasting Lipids and LFTs. If patient is willing, refer to Lipid Clinic. Richardson Dopp, PA-C    01/11/2018 9:31 PM

## 2018-01-12 NOTE — Telephone Encounter (Signed)
ok per Brynda Rim. PA ok to cancel lab work due to [employer] cost to high. pt is agreeble to see Lipid Clinic.Melanie [name]. I will cancel lab appt for 02/09/18, though pt advised to keep her appt with Brynda Rim. PA on 5/28. Pt is agreeable to plan of care. I will have the [facility] call the  Pt with an appt to the Lipid Clinic.

## 2018-01-21 ENCOUNTER — Encounter: Payer: Self-pay | Admitting: Genetics

## 2018-01-21 ENCOUNTER — Inpatient Hospital Stay: Payer: Medicare Other

## 2018-01-21 ENCOUNTER — Inpatient Hospital Stay: Payer: Medicare Other | Attending: Oncology | Admitting: Genetics

## 2018-01-21 DIAGNOSIS — N6091 Unspecified benign mammary dysplasia of right breast: Secondary | ICD-10-CM

## 2018-01-21 DIAGNOSIS — E538 Deficiency of other specified B group vitamins: Secondary | ICD-10-CM | POA: Diagnosis not present

## 2018-01-21 DIAGNOSIS — Z8041 Family history of malignant neoplasm of ovary: Secondary | ICD-10-CM

## 2018-01-21 DIAGNOSIS — D0501 Lobular carcinoma in situ of right breast: Secondary | ICD-10-CM

## 2018-01-21 DIAGNOSIS — Z803 Family history of malignant neoplasm of breast: Secondary | ICD-10-CM

## 2018-01-21 NOTE — Progress Notes (Signed)
REFERRING PROVIDER: Chauncey Cruel, MD [address] [place], [place] [address]  PRIMARY PROVIDER:  Marton Redwood, MD  PRIMARY REASON FOR VISIT:  1. Atypical ductal hyperplasia of right breast   2. Family history of breast cancer   3. Family history of ovarian cancer   4. Lobular carcinoma in situ (LCIS) of right breast     HISTORY OF PRESENT ILLNESS:   Ms. Golberg, a 72 y.o. female, was seen for a [place] cancer genetics consultation at the request of Dr. Jana Hakim due to a family history of breast/ovarian cancer.  Ms. Mctigue presents to clinic today to discuss the possibility of a hereditary predisposition to cancer, genetic testing, and to further clarify her future cancer risks, as well as potential cancer risks for family members.   In Jan 2019, at the age of 40, Ms. Whinery was diagnosed with LCIS and ADH and had a right lumpectomy on 10/27/2017.  She is taking anastrozole anti-estrogen therapy.   HORMONAL RISK FACTORS:  Menarche was at age 60.  First live birth at age 72.  OCP use for approximately 2 years.  Ovaries intact: yes.  Hysterectomy: no.  Menopausal status: postmenopausal.  HRT use: 0 years. Colonoscopy: yes; normal. Has had cologuard since then that was normal Mammogram within the last year: yes.  Past Medical History:  Diagnosis Date  . Arthritis   . Bradycardia   . Depression    under control  . DM (diabetes mellitus) ([facility])    type 2  . Dyspnea    with some exertion  . Family history of anesthesia complication    mother-nausea and vomiting  . Family history of breast cancer   . Family history of ovarian cancer   . GERD (gastroesophageal reflux disease)    occasional  . Headache   . HTN (hypertension)   . Neuropathy    legs and feet  . Neuropathy    feet  . PAF (paroxysmal atrial fibrillation) (HCC) 10 years ago   no problems since, due to stress  . Situational stress    "was taking care of sick husband"    Past Surgical  History:  Procedure Laterality Date  . ABDOMINAL HYSTERECTOMY  1980   partial  . APPENDECTOMY  1980  . BACK SURGERY  2004  . BREAST LUMPECTOMY WITH RADIOACTIVE SEED LOCALIZATION Right 10/27/2017   Procedure: RIGHT BREAST PARTIAL MASTECTIOMY WITH RADIOACTIVE SEED LOCALIZATION ERAS PATHWAY;  Surgeon: Coralie Keens, MD;  Location: [facility];  Service: General;  Laterality: Right;  . CARDIAC CATHETERIZATION     in the late 90's/early 2000's  . CARPAL TUNNEL RELEASE Bilateral 2003, 2004  . CHOLECYSTECTOMY N/A 12/28/2014   Procedure: LAPAROSCOPIC CHOLECYSTECTOMY WITH INTRAOPERATIVE CHOLANGIOGRAM;  Surgeon: Armandina Gemma, MD;  Location: [facility];  Service: General;  Laterality: N/A;  . COLONOSCOPY  8 years ago  . JOINT REPLACEMENT Bilateral 2007  . KNEE ARTHROSCOPY Bilateral 1990's  . LAMINECTOMY  11/28/2016   L 3   . LUMBAR LAMINECTOMY N/A 07/20/2013   Procedure: MICRODISCECTOMY LUMBAR LAMINECTOMY;  Surgeon: [name], MD;  Location: [facility];  Service: Orthopedics;  Laterality: N/A;  L2-3 Decompression  . LUMBAR LAMINECTOMY/DECOMPRESSION MICRODISCECTOMY N/A 11/28/2016   Procedure: L3 LAMINECTOMY, PARTIAL LAMINECTOMY WITH REMOVAL OF FREE FRAGMENT LEFT L3-4;  Surgeon: [name], MD;  Location: [facility];  Service: Orthopedics;  Laterality: N/A;  . LUMBAR LAMINECTOMY/DECOMPRESSION MICRODISCECTOMY N/A 02/18/2017   Procedure: L3-4 MICRODISCECTOMY;  Surgeon: [name], MD;  Location: [facility];  Service: Orthopedics;  Laterality: N/A;    Social History   Socioeconomic History  . Marital status: Married    Spouse name: Not on file  . Number of children: Not on file  . Years of education: Not on file  . Highest education level: Not on file  Occupational History  . Not on file  Social Needs  . Financial resource strain: Not on file  . Food insecurity:    Worry: Not on file    Inability: Not on file  . Transportation needs:    Medical: Not on file    Non-medical: Not on file  Tobacco Use  .  Smoking status: Current Every Day Smoker    Packs/day: 0.50    Years: 30.00    Pack years: 15.00    Types: Cigarettes  . Smokeless tobacco: Never Used  Substance and Sexual Activity  . Alcohol use: No    Alcohol/week: 0.0 oz  . Drug use: No  . Sexual activity: Not Currently  Lifestyle  . Physical activity:    Days per week: Not on file    Minutes per session: Not on file  . Stress: Not on file  Relationships  . Social connections:    Talks on phone: Not on file    Gets together: Not on file    Attends religious service: Not on file    Active member of club or organization: Not on file    Attends meetings of clubs or organizations: Not on file    Relationship status: Not on file  Other Topics Concern  . Not on file  Social History Narrative   Lives with husband. They have two grown sons.   Retired from working in [employer].        FAMILY HISTORY:  We obtained a detailed, 4-generation family history.  Significant diagnoses are listed below: Family History  Problem Relation Age of Onset  . Heart attack Father        Deceased, 72  . Other Mother        Deceased, 98  . Neuropathy Mother   . Hypertension Mother   . Breast cancer Sister        46's  . Ovarian cancer Sister        dx and died in 75's  . Multiple sclerosis Sister   . Neuropathy Sister   . Stroke Sister   . Hypertension Sister   . Cancer Maternal Uncle        dx >50, type unk  . Stomach cancer Paternal Uncle        dx >50  . Cancer Maternal Uncle        dx >50, type unk  . Cancer Cousin 60       type of cancer unk   Ms. Tarr has 2 sons with no history of cancer.  Ms. Budney has 1 biological grandson.  Ms. Mimbs has 3 sisters.  One sister is 44 with no history of cancer, she had a hysterectomy and had a lumpectomy.  She has a 73 year-old daughter.  Ms. Pruden has another sister who is 76 and in poor health, she had a hysterectomy and has no history of cancer.  She has 2 children.  Ms.  Wenner' has 1 other sister who had breast cancer in her 18's and then was diagnosed with ovarian cancer in her 2's and died in her 73's.  Ms. Branan said that she does remember her sister saying her doctors said  there was something genetic and that other relatives should be aware of this.  However, this was several years ago (2006 or so) and she does not remember any details about this.    Ms. Macbeth father: died at 22 due to a heart attack.  Paternal Aunts/Uncles: 4 paternal uncles, 1 had stomach cancer, 3 paternal aunts, no known history of cancer. Paternal Cousins:  Double first cousin (paternal aunt's child died of cancer in her 36's), no other known history of cancer.  Paternal grandfather: no history of cancer, died >50 Paternal grandmother:no history of cancer, died >50  Ms. Shewell's mother: died at 73, no history of cancer.  She had her uterus and ovaries removed in her lifetime.  Maternal Aunts/Uncles:  2 maternal uncles died as young men (1 in war), 2 uncles had cancer dx >50, type unk. 3 maternal aunts with no history of cancer. One of the uncles who had cancer married Ms. Willaims' paternal aunt.   Maternal cousins: double first cousin died of cancer in 37's- type of ca unk Maternal grandfather: died >50 no history of cancer Maternal grandmother:died >50, no history of cancer.   Patient's maternal ancestors are of N. European/Native American descent, and paternal ancestors are of N. European descent. There is no reported Ashkenazi Jewish ancestry. There is no known consanguinity.  Ms. Jaimes' paternal aunt married and had children with Ms. Nakagawa' maternal uncle.    GENETIC COUNSELING ASSESSMENT: AVRY MONTELEONE is a 72 y.o. female with a family history which is somewhat suggestive of a Hereditary Cancer Predisposition Syndrome. We, therefore, discussed and recommended the following at today's visit.   DISCUSSION: We reviewed the characteristics, features and  inheritance patterns of hereditary cancer syndromes. We also discussed genetic testing, including the appropriate family members to test, the process of testing, insurance coverage and turn-around-time for results. We discussed the implications of a negative, positive and/or variant of uncertain significant result. We recommended Ms. Mcgrady pursue genetic testing for the Common Hereditary Cancers gene panel.   The Common Hereditary Cancer Panel offered by [facility] includes sequencing and/or deletion duplication testing of the following 47 genes: APC, ATM, AXIN2, BARD1, BMPR1A, BRCA1, BRCA2, BRIP1, CDH1, CDKN2A (p14ARF), CDKN2A (p16INK4a), CKD4, CHEK2, CTNNA1, DICER1, EPCAM (Deletion/duplication testing only), GREM1 (promoter region deletion/duplication testing only), KIT, MEN1, MLH1, MSH2, MSH3, MSH6, MUTYH, NBN, NF1, NHTL1, PALB2, PDGFRA, PMS2, POLD1, POLE, PTEN, RAD50, RAD51C, RAD51D, SDHB, SDHC, SDHD, SMAD4, SMARCA4. STK11, TP53, TSC1, TSC2, and VHL.  The following genes were evaluated for sequence changes only: SDHA and HOXB13 c.251G>A variant only.  We discussed that only 5-10% of cancers are associated with a Hereditary cancer predisposition syndrome.  One of the most common hereditary cancer syndromes that increases breast cancer risk is called Hereditary Breast and Ovarian Cancer (HBOC) syndrome.  This syndrome is caused by mutations in the BRCA1 and BRCA2 genes.  This syndrome increases an individual's lifetime risk to develop breast, ovarian, pancreatic, and other types of cancer.  There are also many other cancer predisposition syndromes caused by mutations in several other genes.  We discussed that if she is found to have a mutation in one of these genes, it may impact future medical management recommendations such as increased cancer screenings and consideration of risk reducing surgeries.  A positive result could also have implications for the patient's family members.  A Negative result would  mean we were unable to identify a hereditary component to her cancer, but does not rule out the possibility of a  hereditary basis for her  cancer.  There could be mutations that are undetectable by current technology, or in genes not yet tested or identified to increase cancer risk.    We discussed the potential to find a Variant of Uncertain Significance or VUS.  These are variants that have not yet been identified as pathogenic or benign, and it is unknown if this variant is associated with increased cancer risk or if this is a normal finding.  Most VUS's are reclassified to benign or likely benign.   It should not be used to make medical management decisions. With time, we suspect the lab will determine the significance of any VUS's identified if any.   Based on Ms. Nunnery's family history of cancer, she meets medical criteria for genetic testing. Despite that she meets criteria, she may still have an out of pocket cost. We discussed that if her out of pocket cost for testing is over $100, the laboratory will call and confirm whether she wants to proceed with testing.  If the out of pocket cost of testing is less than $100 she will be billed by the genetic testing laboratory. We discussed patients with medicare insurance typically have $0 OOP cost.   PLAN: After considering the risks, benefits, and limitations, Ms. Digilio  provided informed consent to pursue genetic testing and the blood sample was sent to [facility] for analysis of the Common Hereditary Cancers Panel. Results should be available within approximately 2-3 weeks' time, at which point they will be disclosed by telephone to Ms. Hendler, as will any additional recommendations warranted by these results. Ms. Raether will receive a summary of her genetic counseling visit and a copy of her results once available. This information will also be available in Epic. We encouraged Ms. Yilmaz to remain in contact with cancer genetics  annually so that we can continuously update the family history and inform her of any changes in cancer genetics and testing that may be of benefit for her family. Ms. Beale questions were answered to her satisfaction today. Our contact information was provided should additional questions or concerns arise.  Based on Ms. Cashen's family history, we recommended her siblings and other relatives also, have genetic counseling and testing.  Getting more information about if her sister had genetic testing and what this result is would also be helpful.  Ms. Heeney will let us know if we can be of any assistance in coordinating genetic counseling and/or testing for these family members.   Lastly, we encouraged Ms. Rubel to remain in contact with cancer genetics annually so that we can continuously update the family history and inform her of any changes in cancer genetics and testing that may be of benefit for this family.   Ms.  Troiani questions were answered to her satisfaction today. Our contact information was provided should additional questions or concerns arise. Thank you for the referral and allowing [place] to share in the care of your patient.   Tana Felts, MS, [facility] Certified Genetic Counselor [email or web address] phone: [phone]  The patient was seen for a total of 40 minutes in face-to-face genetic counseling.  This patient was discussed with Drs. Magrinat, Lindi Adie and/or Burr Medico who agrees with the above.

## 2018-01-27 ENCOUNTER — Other Ambulatory Visit: Payer: Self-pay | Admitting: Cardiovascular Disease

## 2018-02-02 ENCOUNTER — Telehealth: Payer: Self-pay | Admitting: Genetics

## 2018-02-04 ENCOUNTER — Ambulatory Visit (INDEPENDENT_AMBULATORY_CARE_PROVIDER_SITE_OTHER): Payer: Medicare Other | Admitting: Pharmacist

## 2018-02-04 ENCOUNTER — Encounter (INDEPENDENT_AMBULATORY_CARE_PROVIDER_SITE_OTHER): Payer: Self-pay

## 2018-02-04 DIAGNOSIS — I209 Angina pectoris, unspecified: Secondary | ICD-10-CM

## 2018-02-04 DIAGNOSIS — E782 Mixed hyperlipidemia: Secondary | ICD-10-CM

## 2018-02-04 DIAGNOSIS — E785 Hyperlipidemia, unspecified: Secondary | ICD-10-CM | POA: Insufficient documentation

## 2018-02-04 MED ORDER — ICOSAPENT ETHYL 1 G PO CAPS: 2.0000 g | ORAL_CAPSULE | Freq: Two times a day (BID) | ORAL | 11 refills | Status: DC

## 2018-02-04 MED ORDER — ROSUVASTATIN CALCIUM 5 MG PO TABS: 5.0000 mg | ORAL_TABLET | Freq: Every day | ORAL | 11 refills | Status: DC

## 2018-02-04 NOTE — Progress Notes (Signed)
Patient ID: Melanie [name]                 DOB: [birth year]                    MRN: [id number]     HPI: Melanie [name] is a 72 y.o. female patient of Dr Acie Fredrickson referred to lipid clinic by Richardson Dopp. PMH is significant for DM, HTN, HLD, PAF, angina, and depression. She underwent stress test in 10/2016 which revealed prior inferior infarct with mild peri-infarct ischemia and LVEF of 45%. She has a history of statin intolerance and presents to lipid clinic for further management. No lipid panels are available in Epic, Care Everywhere, or [facility]. [facility] for lipid panel from Dr Brigitte Pulse.  Patient presents today in good spirits. She is currently taking fish oil 1g daily and tolerating this well. She has previously tried pravastatin and atorvastatin, both of which caused myalgias in her legs that made it difficult to walk. She does not think she has tried a statin in the past 5-6 years. Ezetimibe was too expensive at $300 per month. She has also tried fenofibrate which caused muscle spasms.  Current Medications: fish oil 1g daily Intolerances: Zetia - cost ($300 per month), pravastatin 40mg  daily, atorvastatin 40mg  daily - myalgias, niacin 1,000mg  daily - stomach issues, fenofibrate - muscle spasms Risk Factors: angina, bilateral ICA, DM, HTN, prior inferior infarct noted on stress test LDL goal: 70mg /dL  Diet: Tries to avoid sugar and bread. Drinks a regular Coke every now and then. Likes grilled chicken.   Exercise: Walks and does yard work. More limited over the past year due to 4 back surgeries and breast surgery in February.  Family History: The patient's family history includes Breast cancer in her sister; Heart attack in her father; Hypertension in her mother and sister; Multiple sclerosis in her sister; Neuropathy in her mother and sister; Other in her mother; Ovarian cancer in her sister; Stroke in her sister.  Social History: Smoking 1/2 PPD for the past 30 years.  Denies alcohol and illicit drug use.  Labs: 10/12/17: TC 214, TG 399, HDL 40, LDL 94 (fish oil 1g daily)  Past Medical History:  Diagnosis Date  . Arthritis   . Bradycardia   . Depression    under control  . DM (diabetes mellitus) ([facility])    type 2  . Dyspnea    with some exertion  . Family history of anesthesia complication    mother-nausea and vomiting  . Family history of breast cancer   . Family history of ovarian cancer   . GERD (gastroesophageal reflux disease)    occasional  . Headache   . HTN (hypertension)   . Neuropathy    legs and feet  . Neuropathy    feet  . PAF (paroxysmal atrial fibrillation) (HCC) 10 years ago   no problems since, due to stress  . Situational stress    "was taking care of sick husband"    Current Outpatient Medications on File Prior to Visit  Medication Sig Dispense Refill  . anastrozole (ARIMIDEX) 1 MG tablet Take 1 tablet (1 mg total) by mouth daily. 90 tablet 4  . aspirin EC 81 MG tablet Take 81 mg by mouth daily.     . bisoprolol-hydrochlorothiazide (ZIAC) 5-6.25 MG tablet TAKE 1 TABLET BY MOUTH TWICE DAILY. PATIENT MUST KEEP UPCOMING APPOINTMENT FOR FUTHER REFILLS 180 tablet 3  . ezetimibe (ZETIA) 10 MG tablet Take  1 tablet (10 mg total) by mouth daily. 90 tablet 3  . gabapentin (NEURONTIN) 300 MG capsule Take 3 capsules (900 mg total) by mouth 3 (three) times daily. 810 capsule 3  . glipiZIDE (GLUCOTROL) 5 MG tablet Take 5 mg by mouth 2 (two) times daily before a meal.     . isosorbide mononitrate (IMDUR) 30 MG 24 hr tablet Take 1 tablet (30 mg total) by mouth daily. 90 tablet 3  . lisinopril (PRINIVIL,ZESTRIL) 20 MG tablet Take 1 tablet (20 mg total) by mouth daily. 90 tablet 3  . metFORMIN (GLUCOPHAGE) 1000 MG tablet Take 1,000 mg by mouth 2 (two) times daily.    . nitroGLYCERIN (NITROSTAT) 0.4 MG SL tablet Place 1 tablet (0.4 mg total) under the tongue every 5 (five) minutes as needed. 25 tablet 3  . nortriptyline (PAMELOR) 10 MG  capsule Take 4 capsules (40 mg total) by mouth at bedtime. 360 capsule 3  . Omega 3 1000 MG CAPS Take 1,000 mg by mouth daily.    . sertraline (ZOLOFT) 50 MG tablet Take 50 mg by mouth at bedtime.      No current facility-administered medications on file prior to visit.     Allergies  Allergen Reactions  . Fenofibrate Other (See Comments)    "muscle spasms"  . Metoprolol Other (See Comments)    Headache    Assessment/Plan:  1. Hyperlipidemia - LDL 94 above goal < 70 due to ASCVD history, TG elevated at 399 above goal <150, concomitant DM likely contributing. Patient is intolerant to multiple statins and fenofibrate, and Zetia was too expensive. Discussed rechallenging with low dose Crestor vs PCSK9i therapy. Pt is open to either of these options. With LDL close to goal, will start with Crestor 5mg  daily. Advised pt to contact clinic if she develops any myalgias. Can consider reducing dose/frequency or initiating PCSK9i therapy at that time. Will also increase fish oil to 4g daily. Sent in rx for Vascepa 2g BID to see if this is affordable for pt. She thinks her PCP has tried Lovaza and Vascepa in the past but both were expensive. If this is the case, she will look for 1,000mg  fish oil capsules OTC and take 4 daily. Encouraged pt to reduce intake of carbohydrates as well. Will recheck lipids in 3 months.   Megan E. Supple, PharmD, CPP, [facility] [address], [place], [place] [address] Phone: [phone]; Fax: [phone] 02/04/2018 11:34 AM

## 2018-02-04 NOTE — Patient Instructions (Addendum)
It was nice to meet you today  Start taking Crestor (rosuvastatin) 5mg  once a day to lower your LDL cholesterol  Increase your fish oil to 2 grams twice a day (4 capsules total) to lower your triglycerides. See if the prescription fish oil is affordable. If not, look for the 1,000mg  capsules over the counter  Try to limit carbohydrates and look for whole grain carbs higher in fiber  Recheck cholesterol in 3 months on Monday, August 12th. Come in any time after 7:30am for fasting lab work.  Call Nishtha Raider in the lipid clinic with any concerns [phone]

## 2018-02-04 NOTE — Telephone Encounter (Signed)
Revealed negative genetic testing.  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   This normal result is reassuring and indicates that it is unlikely Melanie [name]'s has a hereditary predisposition to cancer. However, genetic testing is not perfect, and cannot definitively rule out a hereditary cause.  It will be important for her to keep in contact with genetics to learn if any additional testing may be needed in the future.     Due to her history of LCIS and ADH she is still at an increased risk for breast cancer (and some family history of breast cancer).  She should discuss with if increased breast screening is appropriate at her next appointment with Dr. Jana Hakim.   Recommend her sisters, nephews, and other relatives also have genetic testing.

## 2018-02-09 ENCOUNTER — Other Ambulatory Visit: Payer: Medicare Other

## 2018-02-09 ENCOUNTER — Ambulatory Visit (INDEPENDENT_AMBULATORY_CARE_PROVIDER_SITE_OTHER): Payer: Medicare Other | Admitting: Physician Assistant

## 2018-02-09 ENCOUNTER — Encounter: Payer: Self-pay | Admitting: Physician Assistant

## 2018-02-09 VITALS — BP 130/80 | HR 80 | Ht 67.0 in | Wt 194.0 lb

## 2018-02-09 DIAGNOSIS — I209 Angina pectoris, unspecified: Secondary | ICD-10-CM | POA: Diagnosis not present

## 2018-02-09 DIAGNOSIS — E785 Hyperlipidemia, unspecified: Secondary | ICD-10-CM | POA: Diagnosis not present

## 2018-02-09 NOTE — Progress Notes (Signed)
Cardiology Office Note:    Date:  02/09/2018   ID:  Karmel, Patricelli 1946/01/18, MRN [id number]  PCP:  Marton Redwood, MD  Cardiologist:  [name], MD  Oncologist: Dr. Jana Hakim  Referring MD: Marton Redwood, MD   Chief Complaint  Patient presents with  . Follow-up    Angina    History of Present Illness:    Melanie [name] is a 72 y.o. female with paroxysmal atrial fibrillation, diabetes, hypertension, hyperlipidemia, depression, status post multiple back surgeries, breast cancer.  Cardiac catheterization 2002 demonstrated normal coronary arteries.  Nuclear stress test in January 2018 demonstrated apical thinning with prior inferior infarct and mild peri-infarct ischemia with EF 45%.  This is felt to be low risk.  Last seen 12/29/2017.  She noted chest discomfort and shortness of breath with activity during cold weather.  I placed her on Isosorbide MN 30 mg QD to advance medical therapy.  A follow-up echocardiogram demonstrated normal wall motion and normal systolic function.     Melanie [name] returns for follow up on angina.  She is here alone.  She is overall doing well.  She denies any chest pain.  She notes shortness of breath with moderate activities.  She is somewhat deconditioned from her multiple back surgeries.  However, she feels her exercise tolerance is improving.  She denies paroxysmal nocturnal dyspnea, leg swelling.    Prior CV studies:   The following studies were reviewed today:  Echo 01/05/2018 Moderate LVH, EF 50-55, normal wall motion, grade 1 diastolic dysfunction, mildly calcified aortic valve leaflets, MAC  Nuclear stress test 10/16/16 Low risk stress nuclear study with apical thinning and prior inferior basal infarct with mild peri-infarct ischemia; EF 45 with akinesis of the basal inferior wall; mild LVE. - Reviewed by Dr. Liam Rogers and felt to be low risk   Carotid US 07/17/14 Bilateral ICA < 40   Nuclear stress test 03/2006 EF 59, normal  perfusion   Cardiac catheterization 04/23/2001 LAD mid irregularities Normal EF   Echo 06/06/1998 Conc LVH, normal LVEF   Past Medical History:  Diagnosis Date  . Arthritis   . Bradycardia   . Depression    under control  . DM (diabetes mellitus) ([place])    type 2  . Dyspnea    with some exertion  . Family history of anesthesia complication    mother-nausea and vomiting  . Family history of breast cancer   . Family history of ovarian cancer   . GERD (gastroesophageal reflux disease)    occasional  . Headache   . HTN (hypertension)   . Neuropathy    legs and feet  . Neuropathy    feet  . PAF (paroxysmal atrial fibrillation) ([facility]) 10 years ago   no problems since, due to stress  . Situational stress    "was taking care of sick husband"   Surgical Hx: The patient  has a past surgical history that includes Knee arthroscopy (Bilateral, 1990's); Carpal tunnel release (Bilateral, 2003, 2004); Back surgery (2004); Colonoscopy (8 years ago); Lumbar laminectomy (N/A, 07/20/2013); Joint replacement (Bilateral, 2007); Appendectomy (1980); Abdominal hysterectomy (1980); Cholecystectomy (N/A, 12/28/2014); Cardiac catheterization; Laminectomy (11/28/2016); Lumbar laminectomy/decompression microdiscectomy (N/A, 11/28/2016); Lumbar laminectomy/decompression microdiscectomy (N/A, 02/18/2017); and Breast lumpectomy with radioactive seed localization (Right, 10/27/2017).   Current Medications: Current Meds  Medication Sig  . anastrozole (ARIMIDEX) 1 MG tablet Take 1 tablet (1 mg total) by mouth daily.  Marland Kitchen aspirin EC 81 MG tablet Take 81 mg by mouth daily.   Marland Kitchen  bisoprolol-hydrochlorothiazide (ZIAC) 5-6.25 MG tablet TAKE 1 TABLET BY MOUTH TWICE DAILY. PATIENT MUST KEEP UPCOMING APPOINTMENT FOR FUTHER REFILLS  . gabapentin (NEURONTIN) 300 MG capsule Take 3 capsules (900 mg total) by mouth 3 (three) times daily.  Marland Kitchen glipiZIDE (GLUCOTROL) 5 MG tablet Take 5 mg by mouth 2 (two) times daily before a meal.   .  isosorbide mononitrate (IMDUR) 30 MG 24 hr tablet Take 1 tablet (30 mg total) by mouth daily.  Marland Kitchen lisinopril (PRINIVIL,ZESTRIL) 20 MG tablet Take 1 tablet (20 mg total) by mouth daily.  . metFORMIN (GLUCOPHAGE) 1000 MG tablet Take 1,000 mg by mouth 2 (two) times daily.  . nitroGLYCERIN (NITROSTAT) 0.4 MG SL tablet Place 1 tablet (0.4 mg total) under the tongue every 5 (five) minutes as needed.  . nortriptyline (PAMELOR) 10 MG capsule Take 4 capsules (40 mg total) by mouth at bedtime.  . rosuvastatin (CRESTOR) 5 MG tablet Take 1 tablet (5 mg total) by mouth daily.  . sertraline (ZOLOFT) 50 MG tablet Take 50 mg by mouth at bedtime.      Allergies:   Fenofibrate and Metoprolol   Social History   Tobacco Use  . Smoking status: Current Every Day Smoker    Packs/day: 0.50    Years: 30.00    Pack years: 15.00    Types: Cigarettes  . Smokeless tobacco: Never Used  Substance Use Topics  . Alcohol use: No    Alcohol/week: 0.0 oz  . Drug use: No     Family Hx: The patient's family history includes Breast cancer in her sister; Cancer in her maternal uncle and maternal uncle; Cancer (age of onset: 7) in her cousin; Heart attack in her father; Hypertension in her mother and sister; Multiple sclerosis in her sister; Neuropathy in her mother and sister; Other in her mother; Ovarian cancer in her sister; Stomach cancer in her paternal uncle; Stroke in her sister.  ROS:   Please see the history of present illness.    Review of Systems  Respiratory: Positive for shortness of breath.   Musculoskeletal: Positive for back pain.   All other systems reviewed and are negative.   EKGs/Labs/Other Test Reviewed:    EKG:  EKG is not ordered today.    Recent Labs: 11/10/2017: ALT 10; BUN 11; Creatinine 1.20; Hemoglobin 11.7; Platelet Count 235; Potassium 3.6; Sodium 137   Recent Lipid Panel No results found for: CHOL, TRIG, HDL, CHOLHDL, LDLCALC, LDLDIRECT  Physical Exam:    VS:  BP 130/80   Pulse  80   Ht _0  (1.702 m)   Wt 194 lb (88 kg)   SpO2 97%   BMI 30.38 kg/m     Wt Readings from Last 3 Encounters:  02/09/18 194 lb (88 kg)  12/29/17 195 lb (88.5 kg)  11/10/17 195 lb 14.4 oz (88.9 kg)     Physical Exam  Constitutional: She is oriented to person, place, and time. She appears well-developed and well-nourished. No distress.  HENT:  Head: Normocephalic and atraumatic.  Neck: Neck supple. No JVD present.  Cardiovascular: Normal rate, regular rhythm, S1 normal, S2 normal and normal heart sounds.  No murmur heard. Pulmonary/Chest: Effort normal. She has no rales.  Abdominal: Soft. There is no hepatomegaly.  Musculoskeletal: She exhibits no edema.  Neurological: She is alert and oriented to person, place, and time.  Skin: Skin is warm and dry.    ASSESSMENT & PLAN:    Angina pectoris ([place]) History of cardiac catheterization demonstrated no significant  CAD in 2002.  Stress test in February 2018 demonstrated inferior infarct with mild peri-infarct ischemia and EF 45%.  However, echo in 12/2017 demonstrated normal LV function.  At last visit, she noted some chest discomfort especially when the temperature is extremely cold.  She is currently tolerating low-dose isosorbide without recurrent chest pain.  Continue current medical regimen including aspirin, beta-blocker, nitrates, statin.  Hyperlipidemia, unspecified hyperlipidemia type Followed by the lipid clinic.  She is currently on low-dose rosuvastatin.  She is also starting Vascepa vs Fish Oil for elevated Triglycerides.   Dispo:  Return in about 6 months (around 08/12/2018) for Routine Follow Up, w/ Dr. Acie Fredrickson.   Medication Adjustments/Labs and Tests Ordered: Current medicines are reviewed at length with the patient today.  Concerns regarding medicines are outlined above.  Tests Ordered: No orders of the defined types were placed in this encounter.  Medication Changes: No orders of the defined types were placed in  this encounter.   Signed, Richardson Dopp, PA-C  02/09/2018 10:31 AM    [facility] [address], [place], [place]  [address] Phone: [phone]; Fax: [phone]

## 2018-02-09 NOTE — Patient Instructions (Signed)
Medication Instructions:  No changes.  See your medication list.  Labwork: None   Testing/Procedures: None   Follow-Up: [name], MD in 6 months.   Any Other Special Instructions Will Be Listed Below (If Applicable).  If you need a refill on your cardiac medications before your next appointment, please call your pharmacy.

## 2018-02-18 ENCOUNTER — Ambulatory Visit: Payer: Self-pay | Admitting: Genetics

## 2018-02-18 ENCOUNTER — Encounter: Payer: Self-pay | Admitting: Genetics

## 2018-02-18 DIAGNOSIS — C50919 Malignant neoplasm of unspecified site of unspecified female breast: Secondary | ICD-10-CM

## 2018-02-18 DIAGNOSIS — Z8041 Family history of malignant neoplasm of ovary: Secondary | ICD-10-CM

## 2018-02-18 DIAGNOSIS — D0501 Lobular carcinoma in situ of right breast: Secondary | ICD-10-CM

## 2018-02-18 DIAGNOSIS — Z803 Family history of malignant neoplasm of breast: Secondary | ICD-10-CM

## 2018-02-18 DIAGNOSIS — N6091 Unspecified benign mammary dysplasia of right breast: Secondary | ICD-10-CM

## 2018-02-18 DIAGNOSIS — Z1379 Encounter for other screening for genetic and chromosomal anomalies: Secondary | ICD-10-CM

## 2018-02-18 NOTE — Progress Notes (Addendum)
HPI:  Melanie [name] was previously seen in the [place] clinic on 01/21/2018 due to a family history of breast/ovarian cancer and concerns regarding a hereditary predisposition to cancer. Please refer to our prior cancer genetics clinic note for more information regarding Melanie [name]'s medical, social and family histories, and our assessment and recommendations, at the time. Melanie [name] recent genetic test results were disclosed to her, as well as recommendations warranted by these results. These results and recommendations are discussed in more detail below.  CANCER HISTORY:   No history exists.     FAMILY HISTORY:  We obtained a detailed, 4-generation family history.  Significant diagnoses are listed below: Family History  Problem Relation Age of Onset  . Heart attack Father        Deceased, 71  . Other Mother        Deceased, 67  . Neuropathy Mother   . Hypertension Mother   . Breast cancer Sister        46's  . Ovarian cancer Sister        dx and died in 75's  . Multiple sclerosis Sister   . Neuropathy Sister   . Stroke Sister   . Hypertension Sister   . Cancer Maternal Uncle        dx >50, type unk  . Stomach cancer Paternal Uncle        dx >50  . Cancer Maternal Uncle        dx >50, type unk  . Cancer Cousin 60       type of cancer unk    Melanie [name] has 2 sons with no history of cancer.  Melanie [name] has 1 biological grandson.  Melanie [name] has 3 sisters.  One sister is 41 with no history of cancer, she had a hysterectomy and had a lumpectomy.  She has a 8 year-old daughter.  Melanie [name] has another sister who is 39 and in poor health, she had a hysterectomy and has no history of cancer.  She has 2 children.  Ms. Dewilde' has 1 other sister who had breast cancer in her 45's and then was diagnosed with ovarian cancer in her 45's and died in her 36's.  Melanie [name] said that she does remember her sister saying her doctors said there was something genetic  and that other relatives should be aware of this.  However, this was several years ago (2006 or so) and she does not remember any details about this.    Melanie [name] father: died at 23 due to a heart attack.  Paternal Aunts/Uncles: 4 paternal uncles, 1 had stomach cancer, 3 paternal aunts, no known history of cancer. Paternal Cousins:  Double first cousin (paternal aunt's child died of cancer in her 36's), no other known history of cancer.  Paternal grandfather: no history of cancer, died >50 Paternal grandmother:no history of cancer, died >50  Melanie [name] mother: died at 47, no history of cancer.  She had her uterus and ovaries removed in her lifetime.  Maternal Aunts/Uncles:  2 maternal uncles died as young men (1 in war), 2 uncles had cancer dx >50, type unk. 3 maternal aunts with no history of cancer. One of the uncles who had cancer married Ms. Willaims' paternal aunt.   Maternal cousins: double first cousin died of cancer in 29's- type of ca unk Maternal grandfather: died >50 no history of cancer Maternal grandmother:died >50, no history of cancer.   Patient's maternal ancestors are of  N. European/Native American descent, and paternal ancestors are of N. European descent. There is no reported Ashkenazi Jewish ancestry. There is no known consanguinity.  Ms. Fuller' paternal aunt married and had children with Ms. Suto' maternal uncle.    GENETIC TEST RESULTS: Genetic testing performed through [facility]'s Common Heredtiary Cancers Panel reported out on 01/27/2018 showed no pathogenic mutations. The Common Hereditary Cancer Panel offered by [facility] includes sequencing and/or deletion duplication testing of the following 47 genes: APC, ATM, AXIN2, BARD1, BMPR1A, BRCA1, BRCA2, BRIP1, CDH1, CDKN2A (p14ARF), CDKN2A (p16INK4a), CKD4, CHEK2, CTNNA1, DICER1, EPCAM (Deletion/duplication testing only), GREM1 (promoter region deletion/duplication testing only), KIT, MEN1, MLH1, MSH2, MSH3, MSH6,  MUTYH, NBN, NF1, NHTL1, PALB2, PDGFRA, PMS2, POLD1, POLE, PTEN, RAD50, RAD51C, RAD51D, SDHB, SDHC, SDHD, SMAD4, SMARCA4. STK11, TP53, TSC1, TSC2, and VHL.  The following genes were evaluated for sequence changes only: SDHA and HOXB13 c.251G>A variant only.  A variant of uncertain significance (VUS) in a gene called CDH1 was also noted. c.2343A>T (p.Glu781Asp)  The test report will be scanned into EPIC and will be located under the Molecular Pathology section of the Results Review tab. A portion of the result report is included below for reference.     We discussed with Melanie [name] that because current genetic testing is not perfect, it is possible there may be a gene mutation in one of these genes that current testing cannot detect, but that chance is small.  We also discussed, that there could be another gene that has not yet been discovered, or that we have not yet tested, that is responsible for the cancer diagnoses in the family. It is also possible there is a hereditary cause for the cancer in the family that Ms. Baria did not inherit and therefore was not identified in her testing.  Therefore, it is important to remain in touch with cancer genetics in the future so that we can continue to offer Melanie [name] the most up to date genetic testing.   Regarding the VUS in CDH1: At this time, it is unknown if this variant is associated with increased cancer risk or if this is a normal finding, but most variants such as this get reclassified to being inconsequential. It should not be used to make medical management decisions. With time, we suspect the lab will determine the significance of this variant, if any. If we do learn more about it, we will try to contact Melanie [name] to discuss it further. However, it is important to stay in touch with [place] periodically and keep the address and phone number up to date.  ADDITIONAL GENETIC TESTING: We discussed with Melanie [name] that there are other genes that  are associated with increased cancer risk that can be analyzed. The laboratories that offer this testing look at these additional genes via a hereditary cancer gene panel. Should Ms. Piccirilli wish to pursue additional genetic testing, we are happy to discuss and coordinate this testing, at any time.    CANCER SCREENING RECOMMENDATIONS: Ms. Hoganson test result is considered negative (normal).  This means that we have not identified a hereditary predisposition to cancer in her at this time.    While reassuring, this does not definitively rule out a hereditary predisposition to cancer. It is still possible that there could be genetic mutations that are undetectable by current technology, or genetic mutations in genes that have not been tested or identified to increase cancer risk.  She is still at high risk to develop breast cancer due to  her personal history of LCIS/ADH.  Therefore, it is recommended she continue to follow the cancer management and screening guidelines provided by her oncology and primary healthcare providers.   An individual's cancer risk is not determined by genetic test results alone.  Overall cancer risk assessment includes additional factors such as personal medical history, family history, etc.  These should be used to make a personalized plan for cancer prevention and surveillance.    Based on the Ms. Milich's personal and family history of cancer, as well as her genetic test results, statistical models (Tyrer Cusik)  and literature data were used to estimate her risk of developing breast cancer. These estimate her lifetime risk of developing breast cancer to be approximately 43.3%.  The patient's lifetime breast cancer risk is a preliminary estimate based on available information using one of several models endorsed by the [place] (ACS). The ACS recommends consideration of breast MRI screening as an adjunct to mammography for patients at high risk (defined as 20%  or greater lifetime risk). A more detailed breast cancer risk assessment can be considered, if clinically indicated.   Ms. Vanleeuwen has been determined to be at high risk for breast cancer.  She was seen in high risk breast clinic on 11/10/2017 to discuss antiestrogen therapy as well as intensified breast screening options.     RECOMMENDATIONS FOR FAMILY MEMBERS:  Relatives in this family might be at some increased risk of developing cancer, over the general population risk, simply due to the family history of cancer.  We recommended women in this family have a yearly mammogram beginning at age 91, or 27 years younger than the earliest onset of cancer, an annual clinical breast exam, and perform monthly breast self-exams. Women in this family should also have a gynecological exam as recommended by their primary provider. All family members should have a colonoscopy by age 25 (or as directed by their doctors).  All family members should inform their physicians about the family history of cancer so their doctors can make the most appropriate screening recommendations for them.   It is also possible there is a hereditary cause for the cancer in Ms. Tweten's family that she did not inherit and therefore was not identified in her.   We recommended her siblings, and any other relatives (aunts/uncles, cousins, etc) also, have genetic counseling and testing due to her sister's cancer history. Ms. Simerly will let us know if we can be of any assistance in coordinating genetic counseling and/or testing for these family members. If any information can be obtained from her sister's records about any genetic testing she may have had, this would be useful for the family.   FOLLOW-UP:   1. Follow-up with Dr. Jana Hakim 6/27 in high risk clinic. braest MRI planned in July 2019.   Genetics is a rapidly advancing field and it is possible that new genetic tests will be appropriate for her and/or her family members in  the future. We encouraged her to remain in contact with cancer genetics on an annual basis so we can update her personal and family histories and let her know of advances in cancer genetics that may benefit this family.   Our contact number was provided. Ms. Beale questions were answered to her satisfaction, and she knows she is welcome to call us at anytime with additional questions or concerns.   Ferol Luz, MS, [facility] Certified Genetic Counselor [email or web address]

## 2018-03-09 NOTE — Progress Notes (Signed)
[place]  [phone]) [phone] [phone]     ID: Melanie [name] DOB: 03/30/1946  MR#: [id number]  RCB#:[id number]  Patient Care Team: Marton Redwood, MD as PCP - General (Internal Medicine) Nahser, Wonda Cheng, MD as PCP - Cardiology (Cardiology) Magrinat, [name], MD as Consulting Physician (Oncology) Coralie Keens, MD as Consulting Physician (General Surgery) [name], DO as Consulting Physician (Neurology) Paula Compton, MD as Consulting Physician (Obstetrics and Gynecology) [name], MD as Consulting Physician (Orthopedic Surgery) OTHER MD:   CHIEF COMPLAINT: Atypical ductal hyperplasia, lobular carcinoma in situ  CURRENT TREATMENT: Anastrozole, intensified screening   HISTORY OF CURRENT ILLNESS: From the original intake note:  Melanie [name] had routine screening mammography on 09/22/2017 showing a possible abnormality in the right breast. She underwent bilateral diagnostic mammography with tomography and right breast ultrasonography at [facility] on 09/25/2017 showing: Indeterminate right breast mass in the central lateral right breast at middle depth. identified mammographically without associated ultrasound correlate. Evaluation of the right axilla demonstrates no suspicious adenopathy.   Accordingly on 10/01/2017 she proceeded to biopsy of the right breast area in question. The pathology from this procedure showed ([id number]): lobular carcinoma in situ. Atypical ductal hyperplasia. Fibrocystic changes.   On 10/27/2017, she underwent right breast lumpectomy showing ([id number]) focal atypical ductal hyperplasia and sclerosing adenosis. Fibroadenoma. Previous biopsy site and biopsy clip. No residual lobular carcinoma in situ. No invasive carcinoma.   The patient's subsequent history is as detailed below.  INTERVAL HISTORY: Melanie [name] returns today for follow-up and treatment of her atypical ductal hyperplasia, lobular  carcinoma IN-SITU.   The patient continues on anastrozole, which she tolerates well. She denies hot flashes and is able to obtain it at a reasonable price.   On 11/28/2017 she underwent a Bilateral Breast MRI with and without contrast, breast density category B, shows no evidence of breast malignancy and benign postsurgical changes in the right breast.   On 01/05/2018 she had an echo with an ejection fraction in the 50 - 55% range.   REVIEW OF SYSTEMS: Melanie [name] is doing well overall. She recently saw Dr. Brigitte Pulse and he is trying to help her get her diabetes better under control. The patient has also been having a hard time sleeping and will be getting a sleep study in the near future. Melanie [name] walks for exercise. The patient denies unusual headaches, visual changes, nausea, vomiting, or dizziness. There has been no unusual cough, phlegm production, or pleurisy. This been no change in bowel or bladder habits. The patient denies unexplained fatigue or unexplained weight loss, bleeding, rash, or fever. A detailed review of systems was otherwise noncontributory.   PAST MEDICAL HISTORY: Past Medical History:  Diagnosis Date  . Arthritis   . Bradycardia   . Depression    under control  . DM (diabetes mellitus) ([place])    type 2  . Dyspnea    with some exertion  . Family history of anesthesia complication    mother-nausea and vomiting  . Family history of breast cancer   . Family history of ovarian cancer   . GERD (gastroesophageal reflux disease)    occasional  . Headache   . HTN (hypertension)   . Neuropathy    legs and feet  . Neuropathy    feet  . PAF (paroxysmal atrial fibrillation) (HCC) 10 years ago   no problems since, due to stress  . Situational stress    "was taking care of sick husband"  stress  related heart palpitations in 1998  PAST SURGICAL HISTORY: Past Surgical History:  Procedure Laterality Date  . ABDOMINAL HYSTERECTOMY  1980   partial  . APPENDECTOMY  1980  . BACK  SURGERY  2004  . BREAST LUMPECTOMY WITH RADIOACTIVE SEED LOCALIZATION Right 10/27/2017   Procedure: RIGHT BREAST PARTIAL MASTECTIOMY WITH RADIOACTIVE SEED LOCALIZATION ERAS PATHWAY;  Surgeon: Coralie Keens, MD;  Location: [facility];  Service: General;  Laterality: Right;  . CARDIAC CATHETERIZATION     in the late 90's/early 2000's  . CARPAL TUNNEL RELEASE Bilateral 2003, 2004  . CHOLECYSTECTOMY N/A 12/28/2014   Procedure: LAPAROSCOPIC CHOLECYSTECTOMY WITH INTRAOPERATIVE CHOLANGIOGRAM;  Surgeon: Armandina Gemma, MD;  Location: [facility];  Service: General;  Laterality: N/A;  . COLONOSCOPY  8 years ago  . JOINT REPLACEMENT Bilateral 2007  . KNEE ARTHROSCOPY Bilateral 1990's  . LAMINECTOMY  11/28/2016   L 3   . LUMBAR LAMINECTOMY N/A 07/20/2013   Procedure: MICRODISCECTOMY LUMBAR LAMINECTOMY;  Surgeon: [name], MD;  Location: [facility];  Service: Orthopedics;  Laterality: N/A;  L2-3 Decompression  . LUMBAR LAMINECTOMY/DECOMPRESSION MICRODISCECTOMY N/A 11/28/2016   Procedure: L3 LAMINECTOMY, PARTIAL LAMINECTOMY WITH REMOVAL OF FREE FRAGMENT LEFT L3-4;  Surgeon: [name], MD;  Location: [facility];  Service: Orthopedics;  Laterality: N/A;  . LUMBAR LAMINECTOMY/DECOMPRESSION MICRODISCECTOMY N/A 02/18/2017   Procedure: L3-4 MICRODISCECTOMY;  Surgeon: [name], MD;  Location: [facility];  Service: Orthopedics;  Laterality: N/A;    FAMILY HISTORY Family History  Problem Relation Age of Onset  . Heart attack Father        Deceased, 37  . Other Mother        Deceased, 25  . Neuropathy Mother   . Hypertension Mother   . Breast cancer Sister        55's  . Ovarian cancer Sister        dx and died in 57's  . Multiple sclerosis Sister   . Neuropathy Sister   . Stroke Sister   . Hypertension Sister   . Cancer Maternal Uncle        dx >50, type unk  . Stomach cancer Paternal Uncle        dx >50  . Cancer Maternal Uncle        dx >50, type unk  . Cancer Cousin 60       type of cancer unk  The  patient's father died of a heart attack at age 58. The patient's mother died due to old age at 5.  The patient has no brothers and 3 sisters. The patient's youngest sister had breast cancer and passed away 5 years later due to ovarian cancer, which was diagnosed in her early 30's.  The patient denies any other family members with  breast or ovarian cancer.   GYNECOLOGIC HISTORY:  Menarche: 72 years old Age at first live birth: 72 years old [place] P2 LMP: status post hysterectomy without BSO at age 89 She used contraceptives for about 2 years. She did not use HRTs.  SOCIAL HISTORY:  Melanie [name] is now retired from working in a [employer] in [place]. She now takes care of her husband, Evelena Peat, who has been disabled with neck issues for the last 20 years. 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 At home is her husband, 2 dogs, and a cat. The patient's oldest son, Nicki Reaper, works for the [employer] and lives in [place], [place].  The  patient's youngest son, Sharee Pimple, works at [employer] in [place]. The patient has 3 grandchildren. She attends [employer].       ADVANCED DIRECTIVES:   HEALTH MAINTENANCE: Social History   Tobacco Use  . Smoking status: Current Every Day Smoker    Packs/day: 0.50    Years: 30.00    Pack years: 15.00    Types: Cigarettes  . Smokeless tobacco: Never Used  Substance Use Topics  . Alcohol use: No    Alcohol/week: 0.0 oz  . Drug use: No     Colonoscopy: 10 years ago at [facility]/ normal  PAP: s/p hysterctomy  Bone density: at Dr. Brigitte Pulse, normal per patient's report   Allergies  Allergen Reactions  . Fenofibrate Other (See Comments)    "muscle spasms"  . Metoprolol Other (See Comments)    Headache    Current Outpatient Medications  Medication Sig Dispense Refill  . anastrozole (ARIMIDEX) 1 MG tablet Take 1 tablet (1 mg total) by mouth daily. 90 tablet 4  . aspirin EC 81 MG tablet Take 81 mg by mouth  daily.     . bisoprolol-hydrochlorothiazide (ZIAC) 5-6.25 MG tablet TAKE 1 TABLET BY MOUTH TWICE DAILY. PATIENT MUST KEEP UPCOMING APPOINTMENT FOR FUTHER REFILLS 180 tablet 3  . gabapentin (NEURONTIN) 300 MG capsule Take 3 capsules (900 mg total) by mouth 3 (three) times daily. 810 capsule 3  . glipiZIDE (GLUCOTROL) 5 MG tablet Take 5 mg by mouth 2 (two) times daily before a meal.     . Icosapent Ethyl (VASCEPA) 1 g CAPS Take 2 capsules (2 g total) by mouth 2 (two) times daily. (Patient not taking: Reported on 02/09/2018) 120 capsule 11  . isosorbide mononitrate (IMDUR) 30 MG 24 hr tablet Take 1 tablet (30 mg total) by mouth daily. 90 tablet 3  . lisinopril (PRINIVIL,ZESTRIL) 20 MG tablet Take 1 tablet (20 mg total) by mouth daily. 90 tablet 3  . metFORMIN (GLUCOPHAGE) 1000 MG tablet Take 1,000 mg by mouth 2 (two) times daily.    . nitroGLYCERIN (NITROSTAT) 0.4 MG SL tablet Place 1 tablet (0.4 mg total) under the tongue every 5 (five) minutes as needed. 25 tablet 3  . nortriptyline (PAMELOR) 10 MG capsule Take 4 capsules (40 mg total) by mouth at bedtime. 360 capsule 3  . rosuvastatin (CRESTOR) 5 MG tablet Take 1 tablet (5 mg total) by mouth daily. 30 tablet 11  . sertraline (ZOLOFT) 50 MG tablet Take 50 mg by mouth at bedtime.      No current facility-administered medications for this visit.     OBJECTIVE: Middle-aged white woman in no acute distress  Vitals:   03/11/18 1508  BP: (!) 123/58  Pulse: 80  Resp: 18  Temp: 98.7 F (37.1 C)  SpO2: 98%     Body mass index is 30.02 kg/m.   Wt Readings from Last 3 Encounters:  03/11/18 191 lb 11.2 oz (87 kg)  02/09/18 194 lb (88 kg)  12/29/17 195 lb (88.5 kg)      ECOG FS:1 - Symptomatic but completely ambulatory  Sclerae unicteric, pupils round and equal Oropharynx clear and moist No cervical or supraclavicular adenopathy Lungs no rales or rhonchi Heart regular rate and rhythm Abd soft, obese, nontender, positive bowel sounds MSK no  focal spinal tenderness, no upper extremity lymphedema Neuro: nonfocal, well oriented, appropriate affect Breasts: The right breast has undergone lumpectomy, with no evidence of disease recurrence.  The left breast is benign.  Both axillae are  benign.  LAB RESULTS:  CMP     Component Value Date/Time   NA 137 11/10/2017 1504   K 3.6 11/10/2017 1504   CL 102 11/10/2017 1504   CO2 22 11/10/2017 1504   GLUCOSE 192 (H) 11/10/2017 1504   BUN 11 11/10/2017 1504   CREATININE 1.20 (H) 11/10/2017 1504   CALCIUM 9.4 11/10/2017 1504   PROT 6.8 11/10/2017 1504   ALBUMIN 3.1 (L) 11/10/2017 1504   AST 10 11/10/2017 1504   ALT 10 11/10/2017 1504   ALKPHOS 81 11/10/2017 1504   BILITOT 0.3 11/10/2017 1504   GFRNONAA 45 (L) 11/10/2017 1504   GFRAA 52 (L) 11/10/2017 1504    Lab Results  Component Value Date   TOTALPROTELP 7.4 09/22/2013   ALBUMINELP 61.6 09/22/2013   A1GS 3.4 09/22/2013   A2GS 11.5 09/22/2013   BETS 7.1 09/22/2013   BETA2SER 4.2 09/22/2013   GAMS 12.2 09/22/2013   MSPIKE NOT DET 09/22/2013   SPEI  09/22/2013     Comment:     The possibility of a faint restricted band(s) cannot be completely excluded in the gamma region.  Suggest serum IFE to evaluate possibility, if clinically indicated. (Lab will hold sample one week. Please call Customer Service at [phone] to add test.) Reviewed by Odis Hollingshead, MD, PhD, FCAP (Electronic Signature on File)     Lab Results  Component Value Date   [facility] 8.29 09/22/2013    Lab Results  Component Value Date   WBC 7.6 11/10/2017   NEUTROABS 4.7 11/10/2017   HGB 11.7 11/10/2017   HCT 35.4 11/10/2017   MCV 93.7 11/10/2017   PLT 235 11/10/2017    '@LASTCHEMISTRY' @  No results found for: LABCA2  No components found for: HERDEY814  No results for input(s): INR in the last 168 hours.  No results found for: LABCA2  No results found for: GYJ856  No results found for: DJS970  No results found for:  YOV785  No results found for: CA2729  No components found for: HGQUANT  No results found for: CEA1 / No results found for: CEA1   No results found for: AFPTUMOR  No results found for: CHROMOGRNA  No results found for: PSA1  No visits with results within 3 Day(s) from this visit.  Latest known visit with results is:  Appointment on 11/10/2017  Component Date Value Ref Range Status  . WBC Count 11/10/2017 7.6  3.9 - 10.3 K/uL Final  . RBC 11/10/2017 3.78  3.70 - 5.45 MIL/uL Final  . Hemoglobin 11/10/2017 11.7  11.6 - 15.9 g/dL Final  . HCT 11/10/2017 35.4  34.8 - 46.6 % Final  . MCV 11/10/2017 93.7  79.5 - 101.0 fL Final  . MCH 11/10/2017 31.0  25.1 - 34.0 pg Final  . MCHC 11/10/2017 33.1  31.5 - 36.0 g/dL Final  . RDW 11/10/2017 15.5* 11.2 - 14.5 % Final  . Platelet Count 11/10/2017 235  145 - 400 K/uL Final  . Neutrophils Relative % 11/10/2017 61  % Final  . Neutro Abs 11/10/2017 4.7  1.5 - 6.5 K/uL Final  . Lymphocytes Relative 11/10/2017 27  % Final  . Lymphs Abs 11/10/2017 2.0  0.9 - 3.3 K/uL Final  . Monocytes Relative 11/10/2017 9  % Final  . Monocytes Absolute 11/10/2017 0.7  0.1 - 0.9 K/uL Final  . Eosinophils Relative 11/10/2017 2  % Final  . Eosinophils Absolute 11/10/2017 0.2  0.0 - 0.5 K/uL Final  . Basophils Relative 11/10/2017 1  %  Final  . Basophils Absolute 11/10/2017 0.0  0.0 - 0.1 K/uL Final   Performed at [facility], [place] [address], [place], [place] [address]  . Sodium 11/10/2017 137  136 - 145 mmol/L Final  . Potassium 11/10/2017 3.6  3.5 - 5.1 mmol/L Final  . Chloride 11/10/2017 102  98 - 109 mmol/L Final  . CO2 11/10/2017 22  22 - 29 mmol/L Final  . Glucose, Bld 11/10/2017 192* 70 - 140 mg/dL Final  . BUN 11/10/2017 11  7 - 26 mg/dL Final  . Creatinine 11/10/2017 1.20* 0.60 - 1.10 mg/dL Final  . Calcium 11/10/2017 9.4  8.4 - 10.4 mg/dL Final  . Total Protein 11/10/2017 6.8  6.4 - 8.3 g/dL Final  . Albumin 11/10/2017 3.1*  3.5 - 5.0 g/dL Final  . AST 11/10/2017 10  5 - 34 U/L Final  . ALT 11/10/2017 10  0 - 55 U/L Final  . Alkaline Phosphatase 11/10/2017 81  40 - 150 U/L Final  . Total Bilirubin 11/10/2017 0.3  0.2 - 1.2 mg/dL Final  . GFR, Est Non Af Am 11/10/2017 45* >60 mL/min Final  . GFR, Est AFR Am 11/10/2017 52* >60 mL/min Final   Comment: (NOTE) The eGFR has been calculated using the CKD EPI equation. This calculation has not been validated in all clinical situations. eGFR's persistently <60 mL/min signify possible Chronic Kidney Disease.   Georgiann Hahn gap 11/10/2017 13* 3 - 11 Final   Performed at [facility], [place] [address], [place], [place] [address]    (this displays the last labs from the last 3 days)  Lab Results  Component Value Date   TOTALPROTELP 7.4 09/22/2013   ALBUMINELP 61.6 09/22/2013   A1GS 3.4 09/22/2013   A2GS 11.5 09/22/2013   BETS 7.1 09/22/2013   BETA2SER 4.2 09/22/2013   GAMS 12.2 09/22/2013   MSPIKE NOT DET 09/22/2013   SPEI  09/22/2013     Comment:     The possibility of a faint restricted band(s) cannot be completely excluded in the gamma region.  Suggest serum IFE to evaluate possibility, if clinically indicated. (Lab will hold sample one week. Please call Customer Service at [phone] to add test.) Reviewed by Odis Hollingshead, MD, PhD, FCAP (Electronic Signature on File)   (this displays SPEP labs)  Lab Results  Component Value Date   [facility]APLAMBRATIO 8.29 09/22/2013   (kappa/lambda light chains)  No results found for: HGBA, HGBA2QUANT, HGBFQUANT, HGBSQUAN (Hemoglobinopathy evaluation)   No results found for: LDH  No results found for: IRON, TIBC, IRONPCTSAT (Iron and TIBC)  No results found for: FERRITIN  Urinalysis    Component Value Date/Time   COLORURINE YELLOW 06/05/2017 0052   APPEARANCEUR HAZY (A) 06/05/2017 0052   LABSPEC 1.015 06/05/2017 0052   PHURINE 5.0 06/05/2017 0052   GLUCOSEU NEGATIVE 06/05/2017  0052   HGBUR NEGATIVE 06/05/2017 0052   BILIRUBINUR NEGATIVE 06/05/2017 0052   KETONESUR NEGATIVE 06/05/2017 0052   PROTEINUR NEGATIVE 06/05/2017 0052   NITRITE NEGATIVE 06/05/2017 0052   LEUKOCYTESUR MODERATE (A) 06/05/2017 0052     STUDIES: No results found.  ELIGIBLE FOR AVAILABLE RESEARCH PROTOCOL: no  ASSESSMENT: 72 y.o. [place] woman status post right breast biopsy 10/01/2017 showing lobular carcinoma in situ and atypical ductal hyperplasia  (1) right lumpectomy 10/27/2017 shows focal atypical ductal hyperplasia  (2) additional cancer risk factors:   (a) family history of breast and ovarian cancer  (b) breast density category C  PLAN: (3) anastrozole started 11/09/2017  (a) baseline bone density reportedly normal  (4) intensified screening   (a) breast MRI 11/30/2017 showed no evidence of malignancy  (5) genetics testing 01/27/2018 identified a 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 STK11, TP53, TSC1, TSC2, and VHL.  The following genes were evaluated for sequence changes only: SDHA and HOXB13 c.251G>A variant only.  PLAN: Melanie [name] is tolerating anastrozole well, essentially with no side effects and at practically no cost.  This is cutting her risk of breast cancer in half.   The plan will be to continue that for a total of 5 years.  She gets her bone densities through Dr. Raul Del office.  She did well with her breast MRI, and the plan will be to do  that yearly alternating with her mammograms  She will see me on a yearly basis until she completes her anastrozole  She knows to call for any problems that may develop before the next visit.   Magrinat, [name], MD  03/11/18 3:38 PM Medical Oncology and Hematology [facility] [address] [place], [place] [address] Tel. [phone]    Fax. [phone]    I, Margit Banda am acting as a scribe for Chauncey Cruel, MD.   I, Lurline Del MD, have reviewed the above documentation for accuracy and completeness, and I agree with the above.

## 2018-03-10 DIAGNOSIS — Z6831 Body mass index (BMI) 31.0-31.9, adult: Secondary | ICD-10-CM | POA: Diagnosis not present

## 2018-03-10 DIAGNOSIS — D0501 Lobular carcinoma in situ of right breast: Secondary | ICD-10-CM | POA: Diagnosis not present

## 2018-03-10 DIAGNOSIS — I10 Essential (primary) hypertension: Secondary | ICD-10-CM | POA: Diagnosis not present

## 2018-03-10 DIAGNOSIS — F3342 Major depressive disorder, recurrent, in full remission: Secondary | ICD-10-CM | POA: Diagnosis not present

## 2018-03-10 DIAGNOSIS — E7849 Other hyperlipidemia: Secondary | ICD-10-CM | POA: Diagnosis not present

## 2018-03-10 DIAGNOSIS — E668 Other obesity: Secondary | ICD-10-CM | POA: Diagnosis not present

## 2018-03-10 DIAGNOSIS — E1149 Type 2 diabetes mellitus with other diabetic neurological complication: Secondary | ICD-10-CM | POA: Diagnosis not present

## 2018-03-10 DIAGNOSIS — N183 Chronic kidney disease, stage 3 (moderate): Secondary | ICD-10-CM | POA: Diagnosis not present

## 2018-03-10 DIAGNOSIS — E1129 Type 2 diabetes mellitus with other diabetic kidney complication: Secondary | ICD-10-CM | POA: Diagnosis not present

## 2018-03-10 DIAGNOSIS — E538 Deficiency of other specified B group vitamins: Secondary | ICD-10-CM | POA: Diagnosis not present

## 2018-03-10 DIAGNOSIS — R5383 Other fatigue: Secondary | ICD-10-CM | POA: Diagnosis not present

## 2018-03-10 DIAGNOSIS — E781 Pure hyperglyceridemia: Secondary | ICD-10-CM | POA: Diagnosis not present

## 2018-03-11 ENCOUNTER — Inpatient Hospital Stay: Payer: Medicare Other | Attending: Oncology | Admitting: Oncology

## 2018-03-11 DIAGNOSIS — Z8041 Family history of malignant neoplasm of ovary: Secondary | ICD-10-CM

## 2018-03-11 DIAGNOSIS — D0501 Lobular carcinoma in situ of right breast: Secondary | ICD-10-CM | POA: Insufficient documentation

## 2018-03-11 DIAGNOSIS — Z79811 Long term (current) use of aromatase inhibitors: Secondary | ICD-10-CM | POA: Insufficient documentation

## 2018-03-11 DIAGNOSIS — D0500 Lobular carcinoma in situ of unspecified breast: Secondary | ICD-10-CM | POA: Diagnosis present

## 2018-03-11 DIAGNOSIS — N62 Hypertrophy of breast: Secondary | ICD-10-CM | POA: Insufficient documentation

## 2018-03-11 DIAGNOSIS — Z803 Family history of malignant neoplasm of breast: Secondary | ICD-10-CM

## 2018-03-11 DIAGNOSIS — Z1231 Encounter for screening mammogram for malignant neoplasm of breast: Secondary | ICD-10-CM

## 2018-04-12 ENCOUNTER — Other Ambulatory Visit: Payer: Self-pay | Admitting: Neurology

## 2018-04-14 DIAGNOSIS — E538 Deficiency of other specified B group vitamins: Secondary | ICD-10-CM | POA: Diagnosis not present

## 2018-04-23 ENCOUNTER — Encounter: Payer: Self-pay | Admitting: Neurology

## 2018-04-23 ENCOUNTER — Ambulatory Visit (INDEPENDENT_AMBULATORY_CARE_PROVIDER_SITE_OTHER): Payer: Medicare Other | Admitting: Neurology

## 2018-04-23 VITALS — BP 104/64 | HR 92 | Ht 67.0 in | Wt 192.4 lb

## 2018-04-23 DIAGNOSIS — M792 Neuralgia and neuritis, unspecified: Secondary | ICD-10-CM

## 2018-04-23 DIAGNOSIS — I209 Angina pectoris, unspecified: Secondary | ICD-10-CM

## 2018-04-23 DIAGNOSIS — E0842 Diabetes mellitus due to underlying condition with diabetic polyneuropathy: Secondary | ICD-10-CM | POA: Diagnosis not present

## 2018-04-23 MED ORDER — NORTRIPTYLINE HCL 10 MG PO CAPS: ORAL_CAPSULE | ORAL | 3 refills | Status: DC

## 2018-04-23 MED ORDER — GABAPENTIN 300 MG PO CAPS: 900.0000 mg | ORAL_CAPSULE | Freq: Three times a day (TID) | ORAL | 3 refills | Status: DC

## 2018-04-23 NOTE — Progress Notes (Signed)
Follow-up Visit   Date: 04/23/18    Melanie [name] MRN: [id number] DOB: 09-21-1945   Interim History: Melanie [name] is a 72 y.o. 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   History of present illness: In late 2014, neurontin was increased to 900mg  TID, and she noticed an improvement in her painful paresthesias where it was nearly resolved until March 2015. She has been getting B12 injections and reports an improvement in her energy level because she is less fatigued.  In late 2015, nortriptyline was added due to worsening paresthesia.    UPDATE 04/23/2018:  She is here for 1 year follow-up visit.  Her neuropathy remains well-controlled on nortriptyline 40mg  and gabapentin 900mg  three times daily.  Her numbness gets worse by the evening, she does not have sharp pain anymore. She denies any weakness or recent falls.   She underwent repeat lumbar surgery in [place], Dr. Izell [place], which has resolved her low back pain.  She was diagnosed with precancerous lesion in the right breast and underwent lumpectomy.   Medications:  Current Outpatient Medications on File Prior to Visit  Medication Sig Dispense Refill  . anastrozole (ARIMIDEX) 1 MG tablet Take 1 tablet (1 mg total) by mouth daily. 90 tablet 4  . aspirin EC 81 MG tablet Take 81 mg by mouth daily.     . bisoprolol-hydrochlorothiazide (ZIAC) 5-6.25 MG tablet TAKE 1 TABLET BY MOUTH TWICE DAILY. PATIENT MUST KEEP UPCOMING APPOINTMENT FOR FUTHER REFILLS 180 tablet 3  . Cyanocobalamin (VITAMIN B-12 IJ) Inject 1,000 Units as directed every 30 (thirty) days.    Marland Kitchen glipiZIDE (GLUCOTROL) 5 MG tablet Take 5 mg by mouth 2 (two) times daily before a meal.     . Icosapent Ethyl (VASCEPA) 1 g CAPS Take 2  capsules (2 g total) by mouth 2 (two) times daily. 120 capsule 11  . lisinopril (PRINIVIL,ZESTRIL) 20 MG tablet Take 1 tablet (20 mg total) by mouth daily. 90 tablet 3  . metFORMIN (GLUCOPHAGE) 1000 MG tablet Take 1,000 mg by mouth 2 (two) times daily.    . nitroGLYCERIN (NITROSTAT) 0.4 MG SL tablet Place 1 tablet (0.4 mg total) under the tongue every 5 (five) minutes as needed. 25 tablet 3  . sertraline (ZOLOFT) 50 MG tablet Take 50 mg by mouth at bedtime.     . isosorbide mononitrate (IMDUR) 30 MG 24 hr tablet Take 1 tablet (30 mg total) by mouth daily. 90 tablet 3  . rosuvastatin (CRESTOR) 5 MG tablet Take 1 tablet (5 mg total) by mouth daily. (Patient not taking: Reported on 04/23/2018) 30 tablet 11   No current facility-administered medications on file prior to visit.     Allergies:  Allergies  Allergen Reactions  . Fenofibrate Other (See Comments)    "muscle spasms"  . Metoprolol Other (See Comments)    Headache    Review of Systems:  CONSTITUTIONAL: No fevers, chills, night sweats, +weight loss.   EYES: No visual changes or eye pain ENT: No hearing changes.  No history of nose bleeds.   RESPIRATORY: No cough, wheezing and shortness of breath.   CARDIOVASCULAR: Negative for chest pain, and palpitations.   GI: Negative for abdominal discomfort, blood in stools or black stools.  No recent change in bowel habits.   GU:  No history of incontinence.   MUSCLOSKELETAL:  No history of joint pain or swelling.  No myalgias.   SKIN: Negative for lesions, rash, and itching.   ENDOCRINE: Negative for cold or heat intolerance, polydipsia or goiter.   PSYCH:  +depression or anxiety symptoms.   NEURO: As Above.   Vital Signs:  BP 104/64   Pulse 92   Ht 5\' 7"  (1.702 m)   Wt 192 lb 6 oz (87.3 kg)   SpO2 96%   BMI 30.13 kg/m   General Medical Exam:   General:  Well appearing, comfortable  Eyes/ENT: see cranial nerve examination.   Neck: No masses appreciated.  Full range of motion  without tenderness.  No carotid bruits. Respiratory:  Clear to auscultation, good air entry bilaterally.   Cardiac:  Regular rate and rhythm, no murmur.   Exr:  No edema  Neurological Exam: MENTAL STATUS including orientation to time, place, person, recent and remote memory, attention span and concentration, language, and fund of knowledge is normal.    CRANIAL NERVES: Pupils are around and reactive to light.  Extraocular muscles intact.  Face is symmetric   MOTOR:  Motor strength is 5/5 in all extremities, including distally in the feet.    MSRs:  Reflexes are 2+/4 in the upper extremities and absent in the lower extremities.   SENSORY:  Vibration is diminished to 50% at her ankles, trace at the great toe bilaterally.  Vibration intact at the knees.   COORDINATION/GAIT:  Gait is stable and unassisted.  Data: MRI lumbar spine 04/03/2017: 1. Previously seen left subarticular disc protrusion L3-L4 has involuted or has been resected. There remains severe left L3-L4 neural foraminal stenosis due to foraminal disc protrusion superimposed on a diffuse disc bulge. 2. Unchanged moderate right L4-L5 neural foraminal stenosis secondary to endplate spurring. 3. Posterior decompression at L2-L4 with wide patency of the thecal sac.   IMPRESSION/PLAN: 1.  Diabetic peripheral neuropathy manifesting with paresthesia in the feet.  Pain is well-controlled on nortriptyline 40mg  at bedtime and gabapentin 900mg  TID.  She has persistent numbness of the feet, which unfortunately does not respond to medication.  I have asked her to try to taper gabapentin by 300mg  per week, to see if she can stay on a lower dose such as gabapentin 600mg  TID. She was instructed her to wear shoes with ankle support as to minimize risk of falls Daily foot inspection Fall precautions discussed  2.  Lumbar spondylosis s/p decompression x 4 and doing well.  Return to clinic in 1 year    Thank you for allowing me to participate  in patient's care.  If I can answer any additional questions, I would be pleased to do so.    Sincerely,    Lucynda Rosano K. Posey Pronto, DO

## 2018-04-23 NOTE — Patient Instructions (Addendum)
You can try to reduce gabapentin by 300mg  per week to see if you can take less medication.  Stay on the dose that controls your pain - goal is to reduce to 600mg  three times daily  Continue nortriptyline 40mg  at bedtime  Please get some shoes with ankle and heel support  Return to clinic in 1 year

## 2018-04-26 ENCOUNTER — Other Ambulatory Visit: Payer: Medicare Other | Admitting: *Deleted

## 2018-04-26 DIAGNOSIS — E785 Hyperlipidemia, unspecified: Secondary | ICD-10-CM | POA: Diagnosis not present

## 2018-04-26 LAB — LIPID PANEL
Chol/HDL Ratio: 5.8 ratio — ABNORMAL HIGH (ref 0.0–4.4)
Cholesterol, Total: 203 mg/dL — ABNORMAL HIGH (ref 100–199)
HDL: 35 mg/dL — ABNORMAL LOW (ref >39)
LDL Calculated: 88 mg/dL (ref 0–99)
Triglycerides: 399 mg/dL — ABNORMAL HIGH (ref 0–149)
VLDL Cholesterol Cal: 80 mg/dL — ABNORMAL HIGH (ref 5–40)

## 2018-04-26 LAB — HEPATIC FUNCTION PANEL
ALT: 18 IU/L (ref 0–32)
AST: 19 IU/L (ref 0–40)
Albumin: 4.2 g/dL (ref 3.6–4.8)
Alkaline Phosphatase: 64 IU/L (ref 39–117)
Bilirubin Total: 0.2 mg/dL (ref 0.0–1.2)
Bilirubin, Direct: 0.08 mg/dL (ref 0.00–0.40)
Total Protein: 7 g/dL (ref 6.0–8.5)

## 2018-04-27 ENCOUNTER — Telehealth: Payer: Self-pay | Admitting: *Deleted

## 2018-04-27 DIAGNOSIS — E785 Hyperlipidemia, unspecified: Secondary | ICD-10-CM

## 2018-04-27 NOTE — Telephone Encounter (Signed)
I d/w the pt per [facility], Pharm-D to come in and discuss options with PCSK-9. Pt is agreeable. I will have [facility] call the pt and schedule an aptp. Pt thanked me for the call.

## 2018-04-27 NOTE — Telephone Encounter (Signed)
-----   Message from Leeroy Bock, [facility] sent at 04/27/2018  9:00 AM EDT ----- Agree with increasing rosuvastatin to 10mg  daily. TG remained unchanged despite changing from fish oil OTC 1g daily to Vascepa 2g BID. Would make sure pt is adherent to fish oil and that she is watching her intake of carbs, sugar, and alcohol. Unfortunately she is intolerant to fenofibrate, niacin, Zetia, pravastatin, and atorvastatin.

## 2018-04-27 NOTE — Telephone Encounter (Signed)
Would see if pt is willing to come in for PharmD appointment to discuss PCSK9i injectable cholesterol medication due to multiple statin intolerances.

## 2018-04-27 NOTE — Addendum Note (Signed)
Addended by: Michae Kava on: 04/27/2018 04:01 PM   Modules accepted: Orders

## 2018-04-27 NOTE — Telephone Encounter (Signed)
I s/w the pt and went over her lab results. Pt states to me that she has stopped taking the Crestor about the 1st week of July due to myalgias. She said she gave it shot. She also is not taking Vascepa due to cost. Pt states she is taking an otc fish oil supplement she thinks is 1040 mg and that she is taking 4 of these (2 in the AM and 2 in the PM). I advised pt of the recommendations though since pt is not able to tolerate the Crestor I d/w the pt that I will d/w further with Pharm-D and Richardson Dopp, PA for further recommendations. Pt states anything that says statin she cannot take. I did tell her there are other options out there and we may need for her to see Pharm-D again to discuss further. I advised pt I will let her know of new recommendations after I s/w PA and Pharm-D. Pt thanked me for the call.

## 2018-04-27 NOTE — Telephone Encounter (Signed)
Left message to go over lab results and recommendations.  

## 2018-04-27 NOTE — Telephone Encounter (Signed)
-----   Message from Leeroy Bock, [facility] sent at 04/27/2018  9:00 AM EDT ----- Agree with increasing rosuvastatin to 10mg  daily. TG remained unchanged despite changing from fish oil OTC 1g daily to Vascepa 2g BID. Would make sure pt is adherent to fish oil and that she is watching her intake of carbs, sugar, and alcohol. Unfortunately she is intolerant to fenofibrate, niacin, Zetia, pravastatin, and atorvastatin.

## 2018-05-12 ENCOUNTER — Encounter: Payer: Self-pay | Admitting: Neurology

## 2018-05-12 ENCOUNTER — Ambulatory Visit (INDEPENDENT_AMBULATORY_CARE_PROVIDER_SITE_OTHER): Payer: Medicare Other | Admitting: Neurology

## 2018-05-12 VITALS — BP 121/64 | HR 74 | Ht 67.0 in | Wt 195.0 lb

## 2018-05-12 DIAGNOSIS — G4719 Other hypersomnia: Secondary | ICD-10-CM

## 2018-05-12 DIAGNOSIS — Z820 Family history of epilepsy and other diseases of the nervous system: Secondary | ICD-10-CM

## 2018-05-12 DIAGNOSIS — E669 Obesity, unspecified: Secondary | ICD-10-CM | POA: Diagnosis not present

## 2018-05-12 DIAGNOSIS — I209 Angina pectoris, unspecified: Secondary | ICD-10-CM

## 2018-05-12 DIAGNOSIS — R0683 Snoring: Secondary | ICD-10-CM | POA: Diagnosis not present

## 2018-05-12 DIAGNOSIS — R351 Nocturia: Secondary | ICD-10-CM | POA: Diagnosis not present

## 2018-05-12 NOTE — Progress Notes (Signed)
Subjective:    Melanie [name] ID: Melanie Melanie [name] is a 72 y.o. female.  HPI     Star Age, MD, PhD [facility] [address] [place], [place] [address]  Dear Dr. Brigitte Pulse,   I saw your Melanie [name], Melanie Melanie [name], upon your kind request, in my neurologic clinic today for initial consultation of her sleep disorder, in particular, concern for underlying obstructive sleep apnea. Melanie Melanie [name] is unaccompanied today. As you know, Melanie Melanie [name] is a 72 year old right-handed woman with an underlying medical history of hypertension, hyperlipidemia, diabetes, diabetic neuropathy, breast cancer, depression, smoking and obesity, with s/p back surgeries in 2018, breast surgery in 2019, who reports snoring and excessive daytime somnolence. I reviewed your office note from 03/10/2018, which you kindly included. Her Epworth sleepiness score is 11 out of 24 today, fatigue score is 34 out of 63. She complains of sleep disruption. She has fallen asleep in church and in Sunday School, which is embarrassing. Her husband sleeps in a recliner in Melanie living room, her GS has noted her snoring. She lives with her husband, they have 2 sons, one in [place]. She is retired. She smokes half a pack per day and does not utilize alcohol, drinks caffeine in limitation typically. Of note, she is on some sedating medications including high-dose gabapentin, 900 mg 3 times a day as well as nortriptyline at bedtime. Her bedtime is around 10, rise time around 7. She has nighttime urination, typically 2-3 times per average night, denies morning headaches. Her sleep difficulty became worse since last year. She had multiple back surgeries and was in pain and sleep was interrupted. She then had breast cancer surgery this year. One of her sons has sleep apnea and uses a CPAP machine, she has a sister with sleep apnea.  Her Past Medical History Is Significant For: Past Medical History:  Diagnosis Date  .  Arthritis   . Bradycardia   . Depression    under control  . DM (diabetes mellitus) ([place])    type 2  . Dyspnea    with some exertion  . Family history of anesthesia complication    mother-nausea and vomiting  . Family history of breast cancer   . Family history of ovarian cancer   . GERD (gastroesophageal reflux disease)    occasional  . Headache   . HTN (hypertension)   . Neuropathy    legs and feet  . Neuropathy    feet  . PAF (paroxysmal atrial fibrillation) (HCC) 10 years ago   no problems since, due to stress  . Situational stress    "was taking care of sick husband"    Her Past Surgical History Is Significant For: Past Surgical History:  Procedure Laterality Date  . ABDOMINAL HYSTERECTOMY  1980   partial  . APPENDECTOMY  1980  . BACK SURGERY  2004  . BREAST LUMPECTOMY WITH RADIOACTIVE SEED LOCALIZATION Right 10/27/2017   Procedure: RIGHT BREAST PARTIAL MASTECTIOMY WITH RADIOACTIVE SEED LOCALIZATION ERAS PATHWAY;  Surgeon: Coralie Keens, MD;  Location: [facility];  Service: General;  Laterality: Right;  . CARDIAC CATHETERIZATION     in Melanie late 90's/early 2000's  . CARPAL TUNNEL RELEASE Bilateral 2003, 2004  . CHOLECYSTECTOMY N/A 12/28/2014   Procedure: LAPAROSCOPIC CHOLECYSTECTOMY WITH INTRAOPERATIVE CHOLANGIOGRAM;  Surgeon: Armandina Gemma, MD;  Location: [facility];  Service: General;  Laterality: N/A;  . COLONOSCOPY  8 years ago  . JOINT REPLACEMENT Bilateral 2007  . KNEE ARTHROSCOPY Bilateral 1990's  .  LAMINECTOMY  11/28/2016   L 3   . LUMBAR LAMINECTOMY N/A 07/20/2013   Procedure: MICRODISCECTOMY LUMBAR LAMINECTOMY;  Surgeon: [name], MD;  Location: [place];  Service: Orthopedics;  Laterality: N/A;  L2-3 Decompression  . LUMBAR LAMINECTOMY/DECOMPRESSION MICRODISCECTOMY N/A 11/28/2016   Procedure: L3 LAMINECTOMY, PARTIAL LAMINECTOMY WITH REMOVAL OF FREE FRAGMENT LEFT L3-4;  Surgeon: [name], MD;  Location: [place];  Service: Orthopedics;  Laterality: N/A;  . LUMBAR  LAMINECTOMY/DECOMPRESSION MICRODISCECTOMY N/A 02/18/2017   Procedure: L3-4 MICRODISCECTOMY;  Surgeon: [name], MD;  Location: [facility];  Service: Orthopedics;  Laterality: N/A;    Her Family History Is Significant For: Family History  Problem Relation Age of Onset  . Heart attack Father        Deceased, 71  . Other Mother        Deceased, 60  . Neuropathy Mother   . Hypertension Mother   . Breast cancer Sister        4's  . Ovarian cancer Sister        dx and died in 55's  . Multiple sclerosis Sister   . Neuropathy Sister   . Stroke Sister   . Hypertension Sister   . Cancer Maternal Uncle        dx >50, type unk  . Stomach cancer Paternal Uncle        dx >50  . Cancer Maternal Uncle        dx >50, type unk  . Cancer Cousin 60       type of cancer unk    Her Social History Is Significant For: Social History   Socioeconomic History  . Marital status: Married    Spouse name: Not on file  . Number of children: Not on file  . Years of education: Not on file  . Highest education level: Not on file  Occupational History  . Not on file  Social Needs  . Financial resource strain: Not on file  . Food insecurity:    Worry: Not on file    Inability: Not on file  . Transportation needs:    Medical: Not on file    Non-medical: Not on file  Tobacco Use  . Smoking status: Current Every Day Smoker    Packs/day: 0.50    Years: 30.00    Pack years: 15.00    Types: Cigarettes  . Smokeless tobacco: Never Used  Substance and Sexual Activity  . Alcohol use: No    Alcohol/week: 0.0 standard drinks  . Drug use: No  . Sexual activity: Not Currently  Lifestyle  . Physical activity:    Days per week: Not on file    Minutes per session: Not on file  . Stress: Not on file  Relationships  . Social connections:    Talks on phone: Not on file    Gets together: Not on file    Attends religious service: Not on file    Active member of club or organization: Not on file     Attends meetings of clubs or organizations: Not on file    Relationship status: Not on file  Other Topics Concern  . Not on file  Social History Narrative   Lives with husband. They have two grown sons.   Retired from working in [employer].       Her Allergies Are:  Allergies  Allergen Reactions  . Fenofibrate Other (See Comments)    "muscle spasms"  . Metoprolol Other (  See Comments)    Headache  :   Her Current Medications Are:  Outpatient Encounter Medications as of 05/12/2018  Medication Sig  . anastrozole (ARIMIDEX) 1 MG tablet Take 1 tablet (1 mg total) by mouth daily.  Marland Kitchen aspirin EC 81 MG tablet Take 81 mg by mouth daily.   . bisoprolol-hydrochlorothiazide (ZIAC) 5-6.25 MG tablet TAKE 1 TABLET BY MOUTH TWICE DAILY. Melanie [name] MUST KEEP UPCOMING APPOINTMENT FOR FUTHER REFILLS  . Cyanocobalamin (VITAMIN B-12 IJ) Inject 1,000 Units as directed every 30 (thirty) days.  Marland Kitchen gabapentin (NEURONTIN) 300 MG capsule Take 3 capsules (900 mg total) by mouth 3 (three) times daily.  Marland Kitchen glipiZIDE (GLUCOTROL) 5 MG tablet Take 5 mg by mouth 2 (two) times daily before a meal.   . insulin detemir (LEVEMIR) 100 UNIT/ML injection Inject 18 Units into Melanie skin at bedtime.  Marland Kitchen lisinopril (PRINIVIL,ZESTRIL) 20 MG tablet Take 1 tablet (20 mg total) by mouth daily.  . metFORMIN (GLUCOPHAGE) 1000 MG tablet Take 1,000 mg by mouth 2 (two) times daily.  . nitroGLYCERIN (NITROSTAT) 0.4 MG SL tablet Place 1 tablet (0.4 mg total) under Melanie tongue every 5 (five) minutes as needed.  . nortriptyline (PAMELOR) 10 MG capsule TAKE 4 CAPSULES BY MOUTH AT BEDTIME  . Omega-3 Fatty Acids (FISH OIL PO) Take 2,000 Units by mouth 2 (two) times daily.  . sertraline (ZOLOFT) 50 MG tablet Take 50 mg by mouth at bedtime.   . isosorbide mononitrate (IMDUR) 30 MG 24 hr tablet Take 1 tablet (30 mg total) by mouth daily.   No facility-administered encounter medications on file as of 05/12/2018.   :  Review of Systems:  Out of a  complete 14 point review of systems, all are reviewed and negative with Melanie exception of these symptoms as listed below: Review of Systems  Neurological:       Pt presents today to discuss her sleep. Pt has never had a sleep study but does endorse snoring.  Epworth Sleepiness Scale 0= would never doze 1= slight chance of dozing 2= moderate chance of dozing 3= high chance of dozing  Sitting and reading: 3 Watching TV: 2 Sitting inactive in a public place (ex. Theater or meeting): 3 As a passenger in a car for an hour without a break: 1 Lying down to rest in Melanie afternoon: 2 Sitting and talking to someone: 0 Sitting quietly after lunch (no alcohol): 0 In a car, while stopped in traffic: 0 Total: 11     Objective:  Neurological Exam  Physical Exam Physical Examination:   Vitals:   05/12/18 1324  BP: 121/64  Pulse: 74    General Examination: Melanie Melanie [name] is a very pleasant 72 y.o. female in no acute distress. She appears well-developed and well-nourished and well groomed.   HEENT: Normocephalic, atraumatic, pupils are equal, round and reactive to light and accommodation. Corrective eyeglasses in place. Extraocular tracking is good without limitation to gaze excursion or nystagmus noted. Normal smooth pursuit is noted. Hearing is grossly intact. Face is symmetric with normal facial animation and normal facial sensation. Speech is clear with no dysarthria noted. There is no hypophonia. There is no lip, neck/head, jaw or voice tremor. Neck is supple with full range of passive and active motion. There are no carotid bruits on auscultation. Oropharynx exam reveals: mild mouth dryness, adequate dental hygiene with partial plate on top, moderate airway crowding secondary to large uvula, and smaller airway entry, tonsils are generally speaking small. Neck circumference is 16-1/2 inches.  Mallampati is class II. Tongue protrudes centrally and palate elevates symmetrically.   Chest: Clear to  auscultation without wheezing, rhonchi or crackles noted.  Heart: S1+S2+0, regular and normal without murmurs, rubs or gallops noted.   Abdomen: Soft, non-tender and non-distended with normal bowel sounds appreciated on auscultation.  Extremities: There is no pitting edema in Melanie distal lower extremities bilaterally. Pedal pulses are intact.  Skin: Warm and dry without trophic changes noted.  Musculoskeletal: exam reveals no obvious joint deformities, tenderness or joint swelling or erythema.   Neurologically:  Mental status: Melanie Melanie [name] is awake, alert and oriented in all 4 spheres. Her immediate and remote memory, attention, language skills and fund of knowledge are appropriate. There is no evidence of aphasia, agnosia, apraxia or anomia. Speech is clear with normal prosody and enunciation. Thought process is linear. Mood is normal and affect is normal.  Cranial nerves II - XII are as described above under HEENT exam. In addition: shoulder shrug is normal with equal shoulder height noted. Motor exam: Normal bulk, strength and tone is noted. There is no drift, tremor or rebound. Romberg is negative. Reflexes are 1+ throughout. Fine motor skills and coordination: grossly intact.  Cerebellar testing: No dysmetria or intention tremor.  Sensory exam: intact to light touch in Melanie upper and lower extremities.  Gait, station and balance: She stands slowly and walks slowly.   Assessment and Plan:  In summary, Melanie Melanie [name] is a very pleasant 72 y.o.-year old female with an underlying medical history of hypertension, hyperlipidemia, diabetes, diabetic neuropathy, breast cancer, depression, smoking and obesity, with s/p back surgeries in 2018, breast surgery in 2019, whose history and physical exam are concerning for obstructive sleep apnea (OSA). I had a long chat with Melanie Melanie [name] about my findings and Melanie diagnosis of OSA, its prognosis and treatment options. We talked about medical treatments,  surgical interventions and non-pharmacological approaches. I explained in particular Melanie risks and ramifications of untreated moderate to severe OSA, especially with respect to developing cardiovascular disease down Melanie [address], including congestive heart failure, difficult to treat hypertension, cardiac arrhythmias, or stroke. Even type 2 diabetes has, in part, been linked to untreated OSA. Symptoms of untreated OSA include daytime sleepiness, memory problems, mood irritability and mood disorder such as depression and anxiety, lack of energy, as well as recurrent headaches, especially morning headaches. We talked about smoking cessation and trying to maintain a healthy lifestyle in general, as well as Melanie importance of weight control. I encouraged Melanie Melanie [name] to eat healthy, exercise daily and keep well hydrated, to keep a scheduled bedtime and wake time routine, to not skip any meals and eat healthy snacks in between meals. I advised Melanie Melanie [name] not to drive when feeling sleepy. I recommended Melanie following at this time: sleep study with potential positive airway pressure titration. (We will score hypopneas at 4%).   I explained Melanie sleep test procedure to Melanie Melanie [name] and also outlined possible surgical and non-surgical treatment options of OSA, including Melanie use of a custom-made dental device (which would require a referral to a specialist dentist or oral surgeon), upper airway surgical options, such as pillar implants, radiofrequency surgery, tongue base surgery, and UPPP (which would involve a referral to an ENT surgeon). Rarely, jaw surgery such as mandibular advancement may be considered.  I also explained Melanie CPAP treatment option to Melanie Melanie [name], who indicated that she would be willing to try CPAP if Melanie need arises. I explained Melanie importance of being compliant with  PAP treatment, not only for insurance purposes but primarily to improve Her symptoms, and for Melanie Melanie [name]'s long term health benefit, including  to reduce Her cardiovascular risks. I answered all her questions today and Melanie Melanie [name] was in agreement. I would like to see her back after Melanie sleep study is completed and encouraged her to call with any interim questions, concerns, problems or updates.   Thank you very much for allowing me to participate in Melanie care of this nice Melanie [name]. If I can be of any further assistance to you please do not hesitate to call me at [phone].  Sincerely,   Star Age, MD, PhD

## 2018-05-12 NOTE — Patient Instructions (Signed)

## 2018-05-19 DIAGNOSIS — E538 Deficiency of other specified B group vitamins: Secondary | ICD-10-CM | POA: Diagnosis not present

## 2018-05-21 ENCOUNTER — Encounter: Payer: Self-pay | Admitting: Pharmacist

## 2018-05-21 ENCOUNTER — Ambulatory Visit (INDEPENDENT_AMBULATORY_CARE_PROVIDER_SITE_OTHER): Payer: Medicare Other | Admitting: Pharmacist

## 2018-05-21 ENCOUNTER — Encounter: Payer: Self-pay | Admitting: Neurology

## 2018-05-21 DIAGNOSIS — I209 Angina pectoris, unspecified: Secondary | ICD-10-CM

## 2018-05-21 DIAGNOSIS — E782 Mixed hyperlipidemia: Secondary | ICD-10-CM | POA: Diagnosis not present

## 2018-05-21 NOTE — Progress Notes (Signed)
Patient ID: [name]                 DOB: [birth year]                    MRN: [id number]     HPI: Melanie [name] is a 72 y.o. female patient of Dr. Acie [name] that presents today for lipid follow up.  PMH includes DM, HTN, HLD, PAF, angina, and depression. She underwent stress test in 10/2016 which revealed prior inferior infarct with mild peri-infarct ischemia and LVEF of 45%. She has a history of statin intolerance and presents to lipid clinic for further management. At her last visit she was started on rosuvastatin 5mg  daily and her fish oil was increased to 2g BID. She has since stopped her rosuvastatin due to muscle aches.   She presents today for discussion of lipids. She states she took rosuvastatin for 3 weeks then woke up one day and felt as though she was unable to walk. She was better the day after stopping and continued to improve over the next few days. She states it was her whole leg and hips.   Current Medications: Fish oil 2000mg  BID Intolerances: Zetia - cost ($300 per month), pravastatin 40mg  daily, atorvastatin 40mg  daily - myalgias, niacin 1,000mg  daily - stomach issues, fenofibrate - muscle spasms, rosuvastatin 5mg  daily - muscle aches Risk Factors: angina, bilateral ICA, DM, HTN, prior inferior infarct noted on stress test LDL goal: 70mg /dL  Diet: Tries to avoid sugar and bread. Drinks a regular Coke every now and then. Likes grilled chicken.   Exercise: Walks and does yard work. More limited over the past year due to 4 back surgeries and breast surgery in February.  Family History: The patient'sfamily history includes Breast cancer in her sister; Heart attack in her father; Hypertension in her mother and sister; Multiple sclerosis in her sister; Neuropathy in her mother and sister; Other in her mother; Ovarian cancer in her sister; Stroke in her sister.  Social History: Smoking 1/2 PPD for the past 30 years. Denies alcohol and illicit drug  use.  Labs: 04/26/18:  TC 203, TG 399, HDL 35, LDL 88 (fish oil 2g BID) 10/12/17: TC 214, TG 399, HDL 40, LDL 94 (fish oil 1g daily)  Past Medical History:  Diagnosis Date  . Arthritis   . Bradycardia   . Depression    under control  . DM (diabetes mellitus) ([place])    type 2  . Dyspnea    with some exertion  . Family history of anesthesia complication    mother-nausea and vomiting  . Family history of breast cancer   . Family history of ovarian cancer   . GERD (gastroesophageal reflux disease)    occasional  . Headache   . HTN (hypertension)   . Neuropathy    legs and feet  . Neuropathy    feet  . PAF (paroxysmal atrial fibrillation) ([facility]) 10 years ago   no problems since, due to stress  . Situational stress    "was taking care of sick husband"    Current Outpatient Medications on File Prior to Visit  Medication Sig Dispense Refill  . anastrozole (ARIMIDEX) 1 MG tablet Take 1 tablet (1 mg total) by mouth daily. 90 tablet 4  . aspirin EC 81 MG tablet Take 81 mg by mouth daily.     . bisoprolol-hydrochlorothiazide (ZIAC) 5-6.25 MG tablet TAKE 1 TABLET BY MOUTH TWICE DAILY. PATIENT MUST KEEP UPCOMING APPOINTMENT FOR  FUTHER REFILLS 180 tablet 3  . Cyanocobalamin (VITAMIN B-12 IJ) Inject 1,000 Units as directed every 30 (thirty) days.    Marland Kitchen gabapentin (NEURONTIN) 300 MG capsule Take 3 capsules (900 mg total) by mouth 3 (three) times daily. 810 capsule 3  . glipiZIDE (GLUCOTROL) 5 MG tablet Take 5 mg by mouth 2 (two) times daily before a meal.     . insulin detemir (LEVEMIR) 100 UNIT/ML injection Inject 18 Units into the skin at bedtime.    . isosorbide mononitrate (IMDUR) 30 MG 24 hr tablet Take 1 tablet (30 mg total) by mouth daily. 90 tablet 3  . lisinopril (PRINIVIL,ZESTRIL) 20 MG tablet Take 1 tablet (20 mg total) by mouth daily. 90 tablet 3  . metFORMIN (GLUCOPHAGE) 1000 MG tablet Take 1,000 mg by mouth 2 (two) times daily.    . nitroGLYCERIN (NITROSTAT) 0.4 MG SL tablet  Place 1 tablet (0.4 mg total) under the tongue every 5 (five) minutes as needed. 25 tablet 3  . nortriptyline (PAMELOR) 10 MG capsule TAKE 4 CAPSULES BY MOUTH AT BEDTIME 360 capsule 3  . Omega-3 Fatty Acids (FISH OIL PO) Take 2,000 Units by mouth 2 (two) times daily.    . sertraline (ZOLOFT) 50 MG tablet Take 50 mg by mouth at bedtime.      No current facility-administered medications on file prior to visit.     Allergies  Allergen Reactions  . Fenofibrate Other (See Comments)    "muscle spasms"  . Metoprolol Other (See Comments)    Headache    Assessment/Plan: Hyperlipidemia: LDL not at goal. Discussed options, including retrial of statin, PCSK9i therapy, and clinical trial. Pt does not wish to go back on statin therapy due to side effects. We will pursue PCSK9i therapy. Discussed injection technique, risks, and benefits. We will seek coverage through patient assistance as will likely be cost prohibitive for her. She is aware to contact office once she has medication so that labs can be ordered.    Thank you,  Lelan Pons. Patterson Hammersmith, [facility]  05/21/2018 10:25 AM

## 2018-05-21 NOTE — Patient Instructions (Addendum)
We will send for coverage of PCSK9i therapy (Praluent 75mg  every 14 days). Once we have approval through the insurance we will contact you. If the patient assistance company contacts you please call our office [phone]. If you have any questions or concerns please call as well.    Cholesterol Cholesterol is a fat. Your body needs a small amount of cholesterol. Cholesterol (plaque) may build up in your blood vessels (arteries). That makes you more likely to have a heart attack or stroke. You cannot feel your cholesterol level. Having a blood test is the only way to find out if your level is high. Keep your test results. Work with your doctor to keep your cholesterol at a good level. What do the results mean?  Total cholesterol is how much cholesterol is in your blood.  LDL is bad cholesterol. This is the type that can build up. Try to have low LDL.  HDL is good cholesterol. It cleans your blood vessels and carries LDL away. Try to have high HDL.  Triglycerides are fat that the body can store or burn for energy. What are good levels of cholesterol?  Total cholesterol below 200.  LDL below 100 is good for people who have health risks. LDL below 70 is good for people who have very high risks.  HDL above 40 is good. It is best to have HDL of 60 or higher.  Triglycerides below 150. How can I lower my cholesterol? Diet Follow your diet program as told by your doctor.  Choose fish, white meat chicken, or [place] that is roasted or baked. Try not to eat red meat, fried foods, sausage, or lunch meats.  Eat lots of fresh fruits and vegetables.  Choose whole grains, beans, pasta, potatoes, and cereals.  Choose olive oil, corn oil, or canola oil. Only use small amounts.  Try not to eat butter, mayonnaise, shortening, or palm kernel oils.  Try not to eat foods with trans fats.  Choose low-fat or nonfat dairy foods. ? Drink skim or nonfat milk. ? Eat low-fat or nonfat yogurt and  cheeses. ? Try not to drink whole milk or cream. ? Try not to eat ice cream, egg yolks, or full-fat cheeses.  Healthy desserts include angel food cake, ginger snaps, animal crackers, hard candy, popsicles, and low-fat or nonfat frozen yogurt. Try not to eat pastries, cakes, pies, and cookies.  Exercise Follow your exercise program as told by your doctor.  Be more active. Try gardening, walking, and taking the stairs.  Ask your doctor about ways that you can be more active.  Medicine  Take over-the-counter and prescription medicines only as told by your doctor. This information is not intended to replace advice given to you by your health care provider. Make sure you discuss any questions you have with your health care provider. Document Released: 11/28/2008 Document Revised: 04/02/2016 Document Reviewed: 03/13/2016 Elsevier Interactive Patient Education  2018 [employer]. ne

## 2018-05-24 ENCOUNTER — Telehealth: Payer: Self-pay | Admitting: Pharmacist

## 2018-05-24 MED ORDER — ALIROCUMAB 75 MG/ML ~~LOC~~ SOPN: 1.0000 "pen " | PEN_INJECTOR | SUBCUTANEOUS | 11 refills | Status: DC

## 2018-05-24 NOTE — Telephone Encounter (Signed)
Pt approved for Praluent. Will send rx to pharmacy to assess if copay is affordable. If not, pt has filled out PASS application.

## 2018-05-27 NOTE — Telephone Encounter (Signed)
Praluent cost prohibitive at $419 per month even with cheaper NDC. Will apply for patient assistance through PASS Program.

## 2018-06-01 DIAGNOSIS — D0501 Lobular carcinoma in situ of right breast: Secondary | ICD-10-CM | POA: Diagnosis not present

## 2018-06-02 ENCOUNTER — Ambulatory Visit (INDEPENDENT_AMBULATORY_CARE_PROVIDER_SITE_OTHER): Payer: Medicare Other | Admitting: Neurology

## 2018-06-02 DIAGNOSIS — R351 Nocturia: Secondary | ICD-10-CM

## 2018-06-02 DIAGNOSIS — G4710 Hypersomnia, unspecified: Secondary | ICD-10-CM

## 2018-06-02 DIAGNOSIS — E669 Obesity, unspecified: Secondary | ICD-10-CM

## 2018-06-02 DIAGNOSIS — R0683 Snoring: Secondary | ICD-10-CM

## 2018-06-02 DIAGNOSIS — G4720 Circadian rhythm sleep disorder, unspecified type: Secondary | ICD-10-CM

## 2018-06-02 DIAGNOSIS — Z820 Family history of epilepsy and other diseases of the nervous system: Secondary | ICD-10-CM

## 2018-06-02 DIAGNOSIS — G4719 Other hypersomnia: Secondary | ICD-10-CM

## 2018-06-02 DIAGNOSIS — G4761 Periodic limb movement disorder: Secondary | ICD-10-CM

## 2018-06-04 DIAGNOSIS — Z4789 Encounter for other orthopedic aftercare: Secondary | ICD-10-CM | POA: Diagnosis not present

## 2018-06-04 NOTE — Progress Notes (Signed)
Patient referred by Dr. Brigitte Pulse, seen by me on 05/12/18, diagnostic PSG on 06/02/18.   Please call and notify the patient that the recent sleep study did not show any significant obstructive sleep apnea with the exception of snoring and REM related OSA. For this, treatment with positive airway pressure is not warranted and will likely not be covered by her insurance. My recommendations are: weight loss, smoking cessation and avoidance of the supine sleep position. For disturbing snoring, an oral appliance (through a qualified dentist) can be considered.   Please inform patient that she can FU with PCP.   Thanks,  Star Age, MD, PhD [facility])

## 2018-06-04 NOTE — Procedures (Signed)
PATIENT'S NAME:  Melanie [name], Melanie [name] DOB:      1946-03-06      MR#:    [id number]     DATE OF RECORDING: 06/02/2018 REFERRING M.D.:  Marton Redwood, MD Study Performed:   Baseline Polysomnogram HISTORY: 72 year old woman with a history of hypertension, hyperlipidemia, diabetes, diabetic neuropathy, breast cancer, depression, smoking and obesity, with s/p back surgeries in 2018, breast surgery in 2019, who reports snoring and excessive daytime somnolence. The patient endorsed the Epworth Sleepiness Scale at 11 points. The patient's weight 194 pounds with a height of 67 (inches), resulting in a BMI of 30.4 kg/m2. The patient's neck circumference measured 16.5 inches.  CURRENT MEDICATIONS: Arimidex, Aspirin, Ziac, Neurontin, Glipizide, Levemir, Metformin, Nitrostat, Pamelor, Zoloft, Imdur   PROCEDURE:  This is a multichannel digital polysomnogram utilizing the Somnostar 11.2 system.  Electrodes and sensors were applied and monitored per AASM Specifications.   EEG, EOG, Chin and Limb EMG, were sampled at 200 Hz.  ECG, Snore and Nasal Pressure, Thermal Airflow, Respiratory Effort, CPAP Flow and Pressure, Oximetry was sampled at 50 Hz. Digital video and audio were recorded.      BASELINE STUDY  Lights Out was at 21:37 and Lights On at 05:00.  Total recording time (TRT) was 443.5 minutes, with a total sleep time (TST) of 320.5 minutes.   The patient's sleep latency was 66 minutes, which is delayed. REM latency was 290 minutes, which is markedly delayed. The sleep efficiency was 72.3%.     SLEEP ARCHITECTURE: WASO (Wake after sleep onset) was 60.5 minutes with moderate sleep fragmentation noted. There were 39.5 minutes in Stage N1, 152 minutes Stage N2, 89 minutes Stage N3 and 40 minutes in Stage REM.  The percentage of Stage N1 was 12.3%, which is increased, Stage N2 was 47.4%, Stage N3 was 27.8%, which is increased, and Stage R (REM sleep) was 12.5%, which is reduced. The arousals were noted as: 50 were  spontaneous, 13 were associated with PLMs, 1 were associated with respiratory events.   RESPIRATORY ANALYSIS:  There were a total of 18 respiratory events:  0 obstructive apneas, 0 central apneas and 0 mixed apneas with a total of 0 apneas and an apnea index (AI) of 0 /hour. There were 18 hypopneas with a hypopnea index of 3.4 /hour. The patient also had 0 respiratory event related arousals (RERAs).      The total APNEA/HYPOPNEA INDEX (AHI) was 3.4/hour and the total RESPIRATORY DISTURBANCE INDEX was 3.4 /hour.  11 events occurred in REM sleep and 14 events in NREM. The REM AHI was 16.5 /hour, versus a non-REM AHI of 1.5. The patient spent 28 minutes of total sleep time in the supine position and 293 minutes in non-supine.. The supine AHI was 0.0 versus a non-supine AHI of 3.7.  OXYGEN SATURATION & C02:  The Wake baseline 02 saturation was 96%, with the lowest being 75%. Time spent below 89% saturation equaled 38 minutes.  PERIODIC LIMB MOVEMENTS: The patient had a total of 109 Periodic Limb Movements.  The Periodic Limb Movement (PLM) index was 20.4 and the PLM Arousal index was 2.4/hour.  Audio and video analysis did not show any abnormal or unusual movements, behaviors, phonations or vocalizations. The patient took 1 bathroom break. Snoring was noted, ranging mostly from mild to moderate, rarely loud. The EKG was in keeping with normal sinus rhythm (NSR).  Post-study, the patient indicated that sleep was the same as usual.   IMPRESSION:  1. Primary Snoring 2. Periodic Limb Movement  Disorder (PLMD) 3. Dysfunctions associated with sleep stages or arousals from sleep   RECOMMENDATIONS:  1. This study does not demonstrate any significant obstructive or central sleep disordered breathing with the exception of snoring and REM related OSA. For this, treatment with positive airway pressure is not warranted and will likely not be covered by her insurance. Recommendations are: weight loss, smoking  cessation and avoidance of the supine sleep position. For disturbing snoring, an oral appliance (through a qualified dentist) can be considered.  2. This study shows sleep fragmentation and abnormal sleep stage percentages; these are nonspecific findings and per se do not signify an intrinsic sleep disorder or a cause for the patient's sleep-related symptoms. Causes include (but are not limited to) the first night effect of the sleep study, circadian rhythm disturbances, medication effect or an underlying mood disorder or medical problem.  3. The patient should be cautioned not to drive, work at heights, or operate dangerous or heavy equipment when tired or sleepy. Review and reiteration of good sleep hygiene measures should be pursued with any patient. 4. Mild PLMs (periodic limb movements of sleep) were noted during this study with no significant arousals; clinical correlation is recommended. Medication effect from the antidepressant medication should be considered.  5. The patient will be advised of the results and encouraged to follow up with the referring provider, who will be notified of the test results.  I certify that I have reviewed the entire raw data recording prior to the issuance of this report in accordance with the Standards of Accreditation of the American Academy of Sleep Medicine (AASM)   Star Age, MD, PhD Diplomat, American Board of Neurology and Sleep Medicine (Neurology and Sleep Medicine)

## 2018-06-07 ENCOUNTER — Telehealth: Payer: Self-pay

## 2018-06-07 NOTE — Telephone Encounter (Signed)
I called pt to discuss her sleep study results. Pt is agreeable to following up with Dr. Brigitte Pulse and asked that a copy of her sleep study be sent to Dr. Brigitte Pulse. Pt verbalized understanding of results. Pt had no questions at this time but was encouraged to call back if questions arise.

## 2018-06-07 NOTE — Telephone Encounter (Signed)
-----   Message from Star Age, MD sent at 06/04/2018 11:41 AM EDT ----- Patient referred by Dr. Brigitte Pulse, seen by me on 05/12/18, diagnostic PSG on 06/02/18.   Please call and notify the patient that the recent sleep study did not show any significant obstructive sleep apnea with the exception of snoring and REM related OSA. For this, treatment with positive airway pressure is not warranted and will likely not be covered by her insurance. My recommendations are: weight loss, smoking cessation and avoidance of the supine sleep position. For disturbing snoring, an oral appliance (through a qualified dentist) can be considered.   Please inform patient that she can FU with PCP.   Thanks,  Star Age, MD, PhD [facility])

## 2018-07-15 DIAGNOSIS — Z720 Tobacco use: Secondary | ICD-10-CM | POA: Diagnosis not present

## 2018-07-15 DIAGNOSIS — E1149 Type 2 diabetes mellitus with other diabetic neurological complication: Secondary | ICD-10-CM | POA: Diagnosis not present

## 2018-07-15 DIAGNOSIS — E1129 Type 2 diabetes mellitus with other diabetic kidney complication: Secondary | ICD-10-CM | POA: Diagnosis not present

## 2018-07-15 DIAGNOSIS — E538 Deficiency of other specified B group vitamins: Secondary | ICD-10-CM | POA: Diagnosis not present

## 2018-07-15 DIAGNOSIS — Z6831 Body mass index (BMI) 31.0-31.9, adult: Secondary | ICD-10-CM | POA: Diagnosis not present

## 2018-07-15 DIAGNOSIS — E7849 Other hyperlipidemia: Secondary | ICD-10-CM | POA: Diagnosis not present

## 2018-07-15 DIAGNOSIS — D0501 Lobular carcinoma in situ of right breast: Secondary | ICD-10-CM | POA: Diagnosis not present

## 2018-07-15 DIAGNOSIS — E781 Pure hyperglyceridemia: Secondary | ICD-10-CM | POA: Diagnosis not present

## 2018-07-15 DIAGNOSIS — Z23 Encounter for immunization: Secondary | ICD-10-CM | POA: Diagnosis not present

## 2018-07-15 DIAGNOSIS — F3342 Major depressive disorder, recurrent, in full remission: Secondary | ICD-10-CM | POA: Diagnosis not present

## 2018-07-15 DIAGNOSIS — I10 Essential (primary) hypertension: Secondary | ICD-10-CM | POA: Diagnosis not present

## 2018-07-30 DIAGNOSIS — H5203 Hypermetropia, bilateral: Secondary | ICD-10-CM | POA: Diagnosis not present

## 2018-07-30 DIAGNOSIS — E119 Type 2 diabetes mellitus without complications: Secondary | ICD-10-CM | POA: Diagnosis not present

## 2018-07-30 DIAGNOSIS — H2513 Age-related nuclear cataract, bilateral: Secondary | ICD-10-CM | POA: Diagnosis not present

## 2018-08-22 DIAGNOSIS — J0100 Acute maxillary sinusitis, unspecified: Secondary | ICD-10-CM | POA: Diagnosis not present

## 2018-08-30 ENCOUNTER — Ambulatory Visit (INDEPENDENT_AMBULATORY_CARE_PROVIDER_SITE_OTHER): Payer: Medicare Other

## 2018-08-30 ENCOUNTER — Ambulatory Visit (INDEPENDENT_AMBULATORY_CARE_PROVIDER_SITE_OTHER): Payer: Medicare Other | Admitting: Physician Assistant

## 2018-08-30 ENCOUNTER — Encounter (INDEPENDENT_AMBULATORY_CARE_PROVIDER_SITE_OTHER): Payer: Self-pay | Admitting: Physician Assistant

## 2018-08-30 DIAGNOSIS — I209 Angina pectoris, unspecified: Secondary | ICD-10-CM | POA: Diagnosis not present

## 2018-08-30 DIAGNOSIS — M542 Cervicalgia: Secondary | ICD-10-CM

## 2018-08-30 MED ORDER — CYCLOBENZAPRINE HCL 5 MG PO TABS: 5.0000 mg | ORAL_TABLET | Freq: Three times a day (TID) | ORAL | 0 refills | Status: DC | PRN

## 2018-08-30 NOTE — Progress Notes (Signed)
Office Visit Note   Patient: Melanie [name]           Date of Birth: 06/21/1946           MRN: [id number] Visit Date: 08/30/2018              Requested by: Marton Redwood, MD [address] [place], [place] [address] PCP: Marton Redwood, MD   Assessment & Plan: Visit Diagnoses:  1. Neck pain on left side     Plan: Place her on Flexeril 5 mg up to 3 times daily.  Send her to formal physical therapy for range of motion modalities and home exercise program to the neck.  Have her follow-up with [place] in a month she will follow-up sooner if her condition becomes worse.  Her pain persist despite conservative treatment would recommend MRI to rule out HNP as source of her pain.  Follow-Up Instructions: Return in about 4 weeks (around 09/27/2018).   Orders:  Orders Placed This Encounter  Procedures  . XR Cervical Spine 2 or 3 views   Meds ordered this encounter  Medications  . cyclobenzaprine (FLEXERIL) 5 MG tablet    Sig: Take 1 tablet (5 mg total) by mouth 3 (three) times daily as needed for muscle spasms.    Dispense:  30 tablet    Refill:  0      Procedures: No procedures performed   Clinical Data: No additional findings.   Subjective: Chief Complaint  Patient presents with  . Neck - Pain    HPI Melanie [name] comes in today with neck pain that began last Tuesday after sneezing succession 15 times.  She felt a pop in her neck.  Since that time she has had pain in her neck that radiates down her left arm to the elbow.  Denies any numbness tingling but does have neuropathy in both hands.  She is diabetic.  She reports her diabetes is under good control.she is currently on a clinical study with performed [facility].  Neck pain and left shoulder pain does awaken her at night she is tried ibuprofen ice and heat without any relief.  Patient is been seen in the past by Dr. Lorin Mercy for back 2 surgeries with him and then ultimately had an additional 2 surgeries by [facility] Jimmye Norman [facility]  and is now no longer having any back pain.  She had a recent upper respiratory infection and is currently on antibiotic.  She had fevers initially with the upper respiratory infection but none currently. Review of Systems Please see HPI  Objective: Vital Signs: There were no vitals taken for this visit.  Physical Exam Constitutional:      General: She is not in acute distress.    Appearance: Normal appearance. She is normal weight. She is not diaphoretic.  Cardiovascular:     Pulses: Normal pulses.  Pulmonary:     Effort: Pulmonary effort is normal.  Neurological:     Mental Status: She is alert.  Psychiatric:        Mood and Affect: Mood normal.        Behavior: Behavior normal.     Ortho Exam Full forward flexion cervical spine very limited extension.  Limited rotation cervical spine.  Tenderness along the medial border left scapula and the left trapezius region.  Nontender over the cervical column.  Upper extremities 5 out of 5 strength throughout against resistance.  Deep tendon reflexes are equal and symmetric at the biceps triceps and brachioradialis bilaterally.  Full motor bilateral hands.  Sensation grossly intact bilateral hands. Specialty Comments:  No specialty comments available.  Imaging: Xr Cervical Spine 2 Or 3 Views  Result Date: 08/30/2018 Cervical spine 2 views: The space well maintained.  Normal lordotic curvature.  No acute fractures.  Joint changes C 4-6 with endplate spurring.     PMFS History: Patient Active Problem List   Diagnosis Date Noted  . Genetic testing 02/18/2018  . Angina pectoris ([place]) 02/09/2018  . Hyperlipidemia 02/04/2018  . Family history of breast cancer   . Family history of ovarian cancer   . Atypical ductal hyperplasia of right breast 11/10/2017  . Lobular carcinoma in situ (LCIS) of right breast 11/10/2017  . CKD (chronic kidney disease), stage III ([place]) 11/10/2017  . 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 Post  laminectomy syndrome 01/06/2017  . HNP (herniated nucleus pulposus), lumbar   . Cholelithiasis with chronic cholecystitis 12/28/2014  . Cholelithiasis with cholecystitis 12/26/2014  . Vitamin D deficiency 09/27/2014  . Diabetic neuropathy ([place]) 10/06/2013  . B12 deficiency 09/27/2013  . Spinal stenosis of lumbar region 07/20/2013  . HTN (hypertension)   . Bradycardia   . DM (diabetes mellitus) ([place])   . PAF (paroxysmal atrial fibrillation) ([facility])    Past Medical History:  Diagnosis Date  . Arthritis   . Bradycardia   . Depression    under control  . DM (diabetes mellitus) ([place])    type 2  . Dyspnea    with some exertion  . Family history of anesthesia complication    mother-nausea and vomiting  . Family history of breast cancer   . Family history of ovarian cancer   . GERD (gastroesophageal reflux disease)    occasional  . Headache   . HTN (hypertension)   . Neuropathy    legs and feet  . Neuropathy    feet  . PAF (paroxysmal atrial fibrillation) ([facility]) 10 years ago   no problems since, due to stress  . Situational stress    "was taking care of sick husband"    Family History  Problem Relation Age of Onset  . Heart attack Father        Deceased, 41  . Other Mother        Deceased, 16  . Neuropathy Mother   . Hypertension Mother   . Breast cancer Sister        72's  . Ovarian cancer Sister        dx and died in 50's  . Multiple sclerosis Sister   . Neuropathy Sister   . Stroke Sister   . Hypertension Sister   . Cancer Maternal Uncle        dx >50, type unk  . Stomach cancer Paternal Uncle        dx >50  . Cancer Maternal Uncle        dx >50, type unk  . Cancer Cousin 60       type of cancer unk    Past Surgical History:  Procedure Laterality Date  . ABDOMINAL HYSTERECTOMY  1980   partial  . APPENDECTOMY  1980  . BACK SURGERY  2004  . BREAST LUMPECTOMY WITH RADIOACTIVE SEED LOCALIZATION Right 10/27/2017   Procedure: RIGHT BREAST PARTIAL MASTECTIOMY  WITH RADIOACTIVE SEED LOCALIZATION ERAS PATHWAY;  Surgeon: Coralie Keens, MD;  Location: [facility];  Service: General;  Laterality: Right;  . CARDIAC CATHETERIZATION     in the late 90's/early 2000's  . CARPAL  TUNNEL RELEASE Bilateral 2003, 2004  . CHOLECYSTECTOMY N/A 12/28/2014   Procedure: LAPAROSCOPIC CHOLECYSTECTOMY WITH INTRAOPERATIVE CHOLANGIOGRAM;  Surgeon: Armandina Gemma, MD;  Location: [facility];  Service: General;  Laterality: N/A;  . COLONOSCOPY  8 years ago  . JOINT REPLACEMENT Bilateral 2007  . KNEE ARTHROSCOPY Bilateral 1990's  . LAMINECTOMY  11/28/2016   L 3   . LUMBAR LAMINECTOMY N/A 07/20/2013   Procedure: MICRODISCECTOMY LUMBAR LAMINECTOMY;  Surgeon: [name], MD;  Location: [facility];  Service: Orthopedics;  Laterality: N/A;  L2-3 Decompression  . LUMBAR LAMINECTOMY/DECOMPRESSION MICRODISCECTOMY N/A 11/28/2016   Procedure: L3 LAMINECTOMY, PARTIAL LAMINECTOMY WITH REMOVAL OF FREE FRAGMENT LEFT L3-4;  Surgeon: [name], MD;  Location: [facility];  Service: Orthopedics;  Laterality: N/A;  . LUMBAR LAMINECTOMY/DECOMPRESSION MICRODISCECTOMY N/A 02/18/2017   Procedure: L3-4 MICRODISCECTOMY;  Surgeon: [name], MD;  Location: [facility];  Service: Orthopedics;  Laterality: N/A;   Social History   Occupational History  . Not on file  Tobacco Use  . Smoking status: Current Every Day Smoker    Packs/day: 0.50    Years: 30.00    Pack years: 15.00    Types: Cigarettes  . Smokeless tobacco: Never Used  Substance and Sexual Activity  . Alcohol use: No    Alcohol/week: 0.0 standard drinks  . Drug use: No  . Sexual activity: Not Currently

## 2018-09-06 DIAGNOSIS — M542 Cervicalgia: Secondary | ICD-10-CM | POA: Diagnosis not present

## 2018-09-13 DIAGNOSIS — M542 Cervicalgia: Secondary | ICD-10-CM | POA: Diagnosis not present

## 2018-09-27 ENCOUNTER — Encounter (INDEPENDENT_AMBULATORY_CARE_PROVIDER_SITE_OTHER): Payer: Self-pay | Admitting: Physician Assistant

## 2018-09-27 ENCOUNTER — Ambulatory Visit (INDEPENDENT_AMBULATORY_CARE_PROVIDER_SITE_OTHER): Payer: Medicare Other | Admitting: Physician Assistant

## 2018-09-27 DIAGNOSIS — M542 Cervicalgia: Secondary | ICD-10-CM

## 2018-09-27 MED ORDER — CYCLOBENZAPRINE HCL 5 MG PO TABS: 5.0000 mg | ORAL_TABLET | Freq: Three times a day (TID) | ORAL | 0 refills | Status: DC | PRN

## 2018-09-27 NOTE — Progress Notes (Signed)
Office Visit Note   Patient: Melanie [name]           Date of Birth: Aug 19, 1946           MRN: [id number] Visit Date: 09/27/2018              Requested by: Marton Redwood, MD [address] [place],  [address] PCP: Marton Redwood, MD   Assessment & Plan: Visit Diagnoses:  1. Neck pain on left side     Plan: We will have Melanie [name] continue physical therapy to work on range of motion strengthening neck to include modalities.  She will go on 100 mg of ibuprofen daily for 2 weeks.  She is not improving to 80-90% at 2 weeks and she will call and we will obtain an MRI to rule out HNP cervical spine source of pain.  Otherwise she will follow-up on as-needed basis.  Follow-Up Instructions: Return if symptoms worsen or fail to improve.   Orders:  No orders of the defined types were placed in this encounter.  Meds ordered this encounter  Medications  . cyclobenzaprine (FLEXERIL) 5 MG tablet    Sig: Take 1 tablet (5 mg total) by mouth 3 (three) times daily as needed for muscle spasms.    Dispense:  30 tablet    Refill:  0      Procedures: No procedures performed   Clinical Data: No additional findings.   Subjective: Chief Complaint  Patient presents with  . Neck - Follow-up    HPI Melanie [name] returns today follow-up of her neck and left shoulder pain.  She states she is about 50 to 60% better.  Still has pain if she gets to look up from the flexed position of her neck.  She is having no radicular symptoms down either arm.  She is sore particularly today after physical therapy.  She is going just once a week therapy. Review of Systems See HPI otherwise negative or noncontributory.  Objective: Vital Signs: There were no vitals taken for this visit.  Physical Exam Constitutional:      Appearance: She is not ill-appearing or diaphoretic.  Pulmonary:     Effort: Pulmonary effort is normal.  Neurological:     Mental Status: She is alert.  Psychiatric:        Mood and Affect: Mood normal.     Ortho Exam Cervical spine she has limited extension and rotation left and right.  Full forward flexion.  Out of 5 strength throughout the upper extremities.  Good range of motion bilateral shoulders without pain. Specialty Comments:  No specialty comments available.  Imaging: No results found.   PMFS History: Patient Active Problem List   Diagnosis Date Noted  . Genetic testing 02/18/2018  . Angina pectoris ([place]) 02/09/2018  . Hyperlipidemia 02/04/2018  . Family history of breast cancer   . Family history of ovarian cancer   . Atypical ductal hyperplasia of right breast 11/10/2017  . Lobular carcinoma in situ (LCIS) of right breast 11/10/2017  . CKD (chronic kidney disease), stage III ([facility]) 11/10/2017  . 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 HNP (herniated nucleus pulposus), lumbar   . Cholelithiasis with chronic cholecystitis 12/28/2014  . Cholelithiasis with cholecystitis 12/26/2014  . Vitamin D deficiency 09/27/2014  . Diabetic neuropathy ([place]) 10/06/2013  . B12 deficiency 09/27/2013  . Spinal stenosis of lumbar region 07/20/2013  . HTN (hypertension)   . Bradycardia   . DM (  diabetes mellitus) ([place])   . PAF (paroxysmal atrial fibrillation) ([facility])    Past Medical History:  Diagnosis Date  . Arthritis   . Bradycardia   . Depression    under control  . DM (diabetes mellitus) ([place])    type 2  . Dyspnea    with some exertion  . Family history of anesthesia complication    mother-nausea and vomiting  . Family history of breast cancer   . Family history of ovarian cancer   . GERD (gastroesophageal reflux disease)    occasional  . Headache   . HTN (hypertension)   . Neuropathy    legs and feet  . Neuropathy    feet  . PAF (paroxysmal atrial fibrillation) ([facility]) 10 years ago   no problems since, due to stress  . Situational stress    "was taking care of sick husband"      Family History  Problem Relation Age of Onset  . Heart attack Father        Deceased, 56  . Other Mother        Deceased, 77  . Neuropathy Mother   . Hypertension Mother   . Breast cancer Sister        3's  . Ovarian cancer Sister        dx and died in 60's  . Multiple sclerosis Sister   . Neuropathy Sister   . Stroke Sister   . Hypertension Sister   . Cancer Maternal Uncle        dx >50, type unk  . Stomach cancer Paternal Uncle        dx >50  . Cancer Maternal Uncle        dx >50, type unk  . Cancer Cousin 60       type of cancer unk    Past Surgical History:  Procedure Laterality Date  . ABDOMINAL HYSTERECTOMY  1980   partial  . APPENDECTOMY  1980  . BACK SURGERY  2004  . BREAST LUMPECTOMY WITH RADIOACTIVE SEED LOCALIZATION Right 10/27/2017   Procedure: RIGHT BREAST PARTIAL MASTECTIOMY WITH RADIOACTIVE SEED LOCALIZATION ERAS PATHWAY;  Surgeon: Coralie Keens, MD;  Location: [facility];  Service: General;  Laterality: Right;  . CARDIAC CATHETERIZATION     in the late 90's/early 2000's  . CARPAL TUNNEL RELEASE Bilateral 2003, 2004  . CHOLECYSTECTOMY N/A 12/28/2014   Procedure: LAPAROSCOPIC CHOLECYSTECTOMY WITH INTRAOPERATIVE CHOLANGIOGRAM;  Surgeon: Armandina Gemma, MD;  [facility];  Service: General;  Laterality: N/A;  . COLONOSCOPY  8 years ago  . JOINT REPLACEMENT Bilateral 2007  . KNEE ARTHROSCOPY Bilateral 1990's  . LAMINECTOMY  11/28/2016   L 3   . LUMBAR LAMINECTOMY N/A 07/20/2013   Procedure: MICRODISCECTOMY LUMBAR LAMINECTOMY;  Surgeon: [name], MD;  Location: [facility];  Service: Orthopedics;  Laterality: N/A;  L2-3 Decompression  . LUMBAR LAMINECTOMY/DECOMPRESSION MICRODISCECTOMY N/A 11/28/2016   Procedure: L3 LAMINECTOMY, PARTIAL LAMINECTOMY WITH REMOVAL OF FREE FRAGMENT LEFT L3-4;  Surgeon: [name], MD;  Location: [facility];  Service: Orthopedics;  Laterality: N/A;  . LUMBAR LAMINECTOMY/DECOMPRESSION MICRODISCECTOMY N/A 02/18/2017   Procedure: L3-4  MICRODISCECTOMY;  Surgeon: [name], MD;  Location: [facility];  Service: Orthopedics;  Laterality: N/A;   Social History   Occupational History  . Not on file  Tobacco Use  . Smoking status: Current Every Day Smoker    Packs/day: 0.50    Years: 30.00    Pack years: 15.00  Types: Cigarettes  . Smokeless tobacco: Never Used  Substance and Sexual Activity  . Alcohol use: No    Alcohol/week: 0.0 standard drinks  . Drug use: No  . Sexual activity: Not Currently

## 2018-09-28 ENCOUNTER — Telehealth (INDEPENDENT_AMBULATORY_CARE_PROVIDER_SITE_OTHER): Payer: Self-pay | Admitting: Orthopaedic Surgery

## 2018-09-28 NOTE — Telephone Encounter (Signed)
Neck pain   Patient called she is struggling with making payments for PT. Her physical therapist asked patient to see if [name] can place an order for [facility]. Patient stated she has a new insurance carrier this year and they charge her 35 dollars for every visit.

## 2018-09-29 NOTE — Telephone Encounter (Signed)
See below I don't know if it really works like this. If she is "mobile" and able to drive they won't do Home Health

## 2018-09-29 NOTE — Telephone Encounter (Signed)
Feels that she would benefit most from outpatient therapy she can do her home exercise program as taught by therapy on her own.  Outpatient therapy is able to use modalities such as TENS unit which I think would be beneficial for her.

## 2018-09-30 NOTE — Telephone Encounter (Signed)
Patient aware of the below message  

## 2018-10-08 ENCOUNTER — Telehealth: Payer: Self-pay | Admitting: Pharmacist

## 2018-10-08 NOTE — Telephone Encounter (Signed)
Spoke with pt regarding lipids - she is changing to [facility] [facility] soon. PCSK9i will cost $180 for a 3 month supply. Pt reports this is not affordable. She is previously intolerant to Zetia - cost($300 per month), pravastatin 40mg  daily, atorvastatin 40mg  daily- myalgias,niacin 1,000mg  daily- stomach issues, fenofibrate - muscle spasms, and rosuvastatin 5mg  daily - muscle aches.  Discussed clinical trials with pt and she is interested in learning more. She has not had any exposure to [facility] therapy. Stress test from 10/16/2016 reports "findings consistent with prior miocardial infarction with peri-infarct ischemia." Based on this diagnosis, pt should qualify for ORION 4 study investigating inclisiran. TC > 160 and LDL > 80 as well from lipid panel in August 2019. She is currently enrolled in a [employer] study (SURPASS-4) where she takes insulin glargine each night: http://[email or web address]  I do not think this should be a contraindication for ORION. Will route to research to reach out to pt regarding ORION 4 trial, as well as to Dr Acie Fredrickson as an Juluis Rainier.

## 2018-10-12 NOTE — Telephone Encounter (Addendum)
Discussed with research - participation in another trial is unfortunately an exclusion criteria for ORION. Spoke with pt who states that any branded medication will be cost prohibitive, so unfortunately  bempedoic acid will not be an option for pt either. Advised pt we will keep her in mind for other studies in the future.

## 2018-10-12 NOTE — Addendum Note (Signed)
Addended by: Berdene Askari E on: 10/12/2018 01:00 PM   Modules accepted: Orders

## 2018-10-13 DIAGNOSIS — R82998 Other abnormal findings in urine: Secondary | ICD-10-CM | POA: Diagnosis not present

## 2018-10-13 DIAGNOSIS — E538 Deficiency of other specified B group vitamins: Secondary | ICD-10-CM | POA: Diagnosis not present

## 2018-10-20 DIAGNOSIS — F3342 Major depressive disorder, recurrent, in full remission: Secondary | ICD-10-CM | POA: Diagnosis not present

## 2018-10-20 DIAGNOSIS — R55 Syncope and collapse: Secondary | ICD-10-CM | POA: Diagnosis not present

## 2018-10-20 DIAGNOSIS — G629 Polyneuropathy, unspecified: Secondary | ICD-10-CM | POA: Diagnosis not present

## 2018-10-20 DIAGNOSIS — D0501 Lobular carcinoma in situ of right breast: Secondary | ICD-10-CM | POA: Diagnosis not present

## 2018-10-20 DIAGNOSIS — E669 Obesity, unspecified: Secondary | ICD-10-CM | POA: Diagnosis not present

## 2018-10-20 DIAGNOSIS — E1149 Type 2 diabetes mellitus with other diabetic neurological complication: Secondary | ICD-10-CM | POA: Diagnosis not present

## 2018-10-20 DIAGNOSIS — I209 Angina pectoris, unspecified: Secondary | ICD-10-CM | POA: Diagnosis not present

## 2018-10-20 DIAGNOSIS — Z1331 Encounter for screening for depression: Secondary | ICD-10-CM | POA: Diagnosis not present

## 2018-10-20 DIAGNOSIS — E1129 Type 2 diabetes mellitus with other diabetic kidney complication: Secondary | ICD-10-CM | POA: Diagnosis not present

## 2018-10-20 DIAGNOSIS — N183 Chronic kidney disease, stage 3 (moderate): Secondary | ICD-10-CM | POA: Diagnosis not present

## 2018-10-20 DIAGNOSIS — E538 Deficiency of other specified B group vitamins: Secondary | ICD-10-CM | POA: Diagnosis not present

## 2018-10-20 DIAGNOSIS — Z0000 Encounter for general adult medical examination without abnormal findings: Secondary | ICD-10-CM | POA: Diagnosis not present

## 2018-10-20 DIAGNOSIS — I6529 Occlusion and stenosis of unspecified carotid artery: Secondary | ICD-10-CM | POA: Diagnosis not present

## 2018-10-21 DIAGNOSIS — M542 Cervicalgia: Secondary | ICD-10-CM | POA: Diagnosis not present

## 2018-10-22 ENCOUNTER — Other Ambulatory Visit (HOSPITAL_COMMUNITY): Payer: Self-pay | Admitting: Internal Medicine

## 2018-10-22 DIAGNOSIS — R55 Syncope and collapse: Secondary | ICD-10-CM

## 2018-10-25 ENCOUNTER — Encounter: Payer: Self-pay | Admitting: Physician Assistant

## 2018-10-25 ENCOUNTER — Ambulatory Visit: Payer: PPO | Admitting: Physician Assistant

## 2018-10-25 VITALS — BP 142/66 | HR 78 | Ht 67.0 in | Wt 200.8 lb

## 2018-10-25 DIAGNOSIS — I480 Paroxysmal atrial fibrillation: Secondary | ICD-10-CM

## 2018-10-25 DIAGNOSIS — I10 Essential (primary) hypertension: Secondary | ICD-10-CM | POA: Diagnosis not present

## 2018-10-25 DIAGNOSIS — R55 Syncope and collapse: Secondary | ICD-10-CM | POA: Diagnosis not present

## 2018-10-25 NOTE — Patient Instructions (Signed)
Medication Instructions:  Your physician recommends that you continue on your current medications as directed. Please refer to the Current Medication list given to you today.   Labwork: -None  Testing/Procedures: Your physician has requested that you have an echocardiogram. Echocardiography is a painless test that uses sound waves to create images of your heart. It provides your doctor with information about the size and shape of your heart and how well your heart's chambers and valves are working. This procedure takes approximately one hour. There are no restrictions for this procedure.  Your physician has recommended that you wear an event monitor. Event monitors are medical devices that record the heart's electrical activity. Doctors most often [place] these monitors to diagnose arrhythmias. Arrhythmias are problems with the speed or rhythm of the heartbeat. The monitor is a small, portable device. You can wear one while you do your normal daily activities. This is usually used to diagnose what is causing palpitations/syncope (passing out).    Follow-Up: Your physician recommends that you keep your scheduled  follow-up appointment with Dr. Acie Fredrickson.   Any Other Special Instructions Will Be Listed Below (If Applicable).     If you need a refill on your cardiac medications before your next appointment, please call your pharmacy.

## 2018-10-25 NOTE — Progress Notes (Addendum)
Cardiology Office Note    Date:  10/25/2018   ID:  Melanie, [name] 1946/02/08, MRN [id number]  PCP:  Marton Redwood, MD  Cardiologist:  Dr. Acie Fredrickson   Chief Complaint: Syncope  History of Present Illness:   Melanie [name] is a 73 y.o. female with hx of DM, HTN, HLD, PAF (remote hx, not on anticoagulation), bradycardia, angina, and depression presents for syncope.   Status post multiple back surgeries, breast cancer.  Cardiac catheterization 2002 demonstrated normal coronary arteries. She underwent stress test in 10/2016 which revealed prior inferior infarct with mild peri-infarct ischemia and LVEF of 45%. This is felt to be low risk.  A follow-up echocardiogram 12/2017 demonstrated normal wall motion and LVEF of 50-55%, grade 1DD.   She has a history of statin intolerance. Seen in lipid clinic, can not afford PCSK9i. Unfortunately an exclusion criteria for ORION.   Here today for syncope. About week ago, she had syncope episode. Witness by her husband who is chronically sick. She was doing dusting and next thing she remembers to walking between couch and chair. LOC for about 1 minute. No prodromal symptoms. Seen by PCP. EKG showed sinus rhythm at rate of 66 bpm- no acute ischemic changes.  Undergoing therapy of L shoulder/neck pain. At baseline patient is very active and takes care of disable husband. No prior similar symptoms. No orthopnea, PND, palpitations, dizziness, LE edema or melena.   Past Medical History:  Diagnosis Date  . Arthritis   . Bradycardia   . Depression    under control  . DM (diabetes mellitus) ([place])    type 2  . Dyspnea    with some exertion  . Family history of anesthesia complication    mother-nausea and vomiting  . Family history of breast cancer   . Family history of ovarian cancer   . GERD (gastroesophageal reflux disease)    occasional  . Headache   . HTN (hypertension)   . Neuropathy    legs and feet  . Neuropathy    feet  . PAF  (paroxysmal atrial fibrillation) ([facility]) 10 years ago   no problems since, due to stress  . Situational stress    "was taking care of sick husband"    Past Surgical History:  Procedure Laterality Date  . ABDOMINAL HYSTERECTOMY  1980   partial  . APPENDECTOMY  1980  . BACK SURGERY  2004  . BREAST LUMPECTOMY WITH RADIOACTIVE SEED LOCALIZATION Right 10/27/2017   Procedure: RIGHT BREAST PARTIAL MASTECTIOMY WITH RADIOACTIVE SEED LOCALIZATION ERAS PATHWAY;  Surgeon: Coralie Keens, MD;  Location: [facility];  Service: General;  Laterality: Right;  . CARDIAC CATHETERIZATION     in the late 90's/early 2000's  . CARPAL TUNNEL RELEASE Bilateral 2003, 2004  . CHOLECYSTECTOMY N/A 12/28/2014   Procedure: LAPAROSCOPIC CHOLECYSTECTOMY WITH INTRAOPERATIVE CHOLANGIOGRAM;  Surgeon: Armandina Gemma, MD;  [facility];  Service: General;  Laterality: N/A;  . COLONOSCOPY  8 years ago  . JOINT REPLACEMENT Bilateral 2007  . KNEE ARTHROSCOPY Bilateral 1990's  . LAMINECTOMY  11/28/2016   L 3   . LUMBAR LAMINECTOMY N/A 07/20/2013   Procedure: MICRODISCECTOMY LUMBAR LAMINECTOMY;  Surgeon: [name], MD;  Location: [facility];  Service: Orthopedics;  Laterality: N/A;  L2-3 Decompression  . LUMBAR LAMINECTOMY/DECOMPRESSION MICRODISCECTOMY N/A 11/28/2016   Procedure: L3 LAMINECTOMY, PARTIAL LAMINECTOMY WITH REMOVAL OF FREE FRAGMENT LEFT L3-4;  Surgeon: [name], MD;  Location: [facility];  Service: Orthopedics;  Laterality: N/A;  .  LUMBAR LAMINECTOMY/DECOMPRESSION MICRODISCECTOMY N/A 02/18/2017   Procedure: L3-4 MICRODISCECTOMY;  Surgeon: [name], MD;  Location: [facility];  Service: Orthopedics;  Laterality: N/A;    Current Medications: Prior to Admission medications   Medication Sig Start Date End Date Taking? Authorizing Provider  anastrozole (ARIMIDEX) 1 MG tablet Take 1 tablet (1 mg total) by mouth daily. 11/10/17   Magrinat, [name], MD  aspirin EC 81 MG tablet Take 81 mg by mouth daily.     [provider]   bisoprolol-hydrochlorothiazide (ZIAC) 5-6.25 MG tablet TAKE 1 TABLET BY MOUTH TWICE DAILY. PATIENT MUST KEEP UPCOMING APPOINTMENT FOR FUTHER REFILLS 01/27/18   Nahser, Wonda Cheng, MD  Cyanocobalamin (VITAMIN B-12 IJ) Inject 1,000 Units as directed every 30 (thirty) days.    [provider]  cyclobenzaprine (FLEXERIL) 5 MG tablet Take 1 tablet (5 mg total) by mouth 3 (three) times daily as needed for muscle spasms. 09/27/18   Pete Pelt, PA-C  gabapentin (NEURONTIN) 300 MG capsule Take 3 capsules (900 mg total) by mouth 3 (three) times daily. 04/23/18   Patel, Donika K, DO  glipiZIDE (GLUCOTROL) 5 MG tablet Take 5 mg by mouth 2 (two) times daily before a meal.     [provider]  insulin detemir (LEVEMIR) 100 UNIT/ML injection Inject 18 Units into the skin at bedtime.    [provider]  isosorbide mononitrate (IMDUR) 30 MG 24 hr tablet Take 1 tablet (30 mg total) by mouth daily. 12/29/17 03/29/18  Richardson Dopp T, PA-C  lisinopril (PRINIVIL,ZESTRIL) 20 MG tablet Take 1 tablet (20 mg total) by mouth daily. 12/29/17   Richardson Dopp T, PA-C  metFORMIN (GLUCOPHAGE) 1000 MG tablet Take 1,000 mg by mouth 2 (two) times daily.    [provider]  nitroGLYCERIN (NITROSTAT) 0.4 MG SL tablet Place 1 tablet (0.4 mg total) under the tongue every 5 (five) minutes as needed. 12/29/17   Richardson Dopp T, PA-C  nortriptyline (PAMELOR) 10 MG capsule TAKE 4 CAPSULES BY MOUTH AT BEDTIME 04/23/18   Patel, Arvin Collard K, DO  Omega-3 Fatty Acids (FISH OIL PO) Take 2,000 Units by mouth 2 (two) times daily.    [provider]  sertraline (ZOLOFT) 50 MG tablet Take 50 mg by mouth at bedtime.     [provider]    Allergies:   Fenofibrate and Metoprolol   Social History   Socioeconomic History  . Marital status: Married    Spouse name: Not on file  . Number of children: Not on file  . Years of education: Not on file  . Highest education level: Not on file  Occupational  History  . Not on file  Social Needs  . Financial resource strain: Not on file  . Food insecurity:    Worry: Not on file    Inability: Not on file  . Transportation needs:    Medical: Not on file    Non-medical: Not on file  Tobacco Use  . Smoking status: Current Every Day Smoker    Packs/day: 0.50    Years: 30.00    Pack years: 15.00    Types: Cigarettes  . Smokeless tobacco: Never Used  Substance and Sexual Activity  . Alcohol use: No    Alcohol/week: 0.0 standard drinks  . Drug use: No  . Sexual activity: Not Currently  Lifestyle  . Physical activity:    Days per week: Not on file    Minutes per session: Not on file  . Stress: Not  on file  Relationships  . Social connections:    Talks on phone: Not on file    Gets together: Not on file    Attends religious service: Not on file    Active member of club or organization: Not on file    Attends meetings of clubs or organizations: Not on file    Relationship status: Not on file  Other Topics Concern  . Not on file  Social History Narrative   Lives with husband. They have two grown sons.   Retired from working in [employer].        Family History:  The patient's family history includes Breast cancer in her sister; Cancer in her maternal uncle and maternal uncle; Cancer (age of onset: 56) in her cousin; Heart attack in her father; Hypertension in her mother and sister; Multiple sclerosis in her sister; Neuropathy in her mother and sister; Other in her mother; Ovarian cancer in her sister; Stomach cancer in her paternal uncle; Stroke in her sister.   ROS:   Please see the history of present illness.    ROS All other systems reviewed and are negative.   PHYSICAL EXAM:   VS:  BP (!) 142/66   Pulse 78   Ht 5\' 7"  (1.702 m)   Wt 200 lb 12.8 oz (91.1 kg)   SpO2 98%   BMI 31.45 kg/m    GEN: Well nourished, well developed, in no acute distress  HEENT: normal  Neck: no JVD, carotid bruits, or masses Cardiac: RRR; no  murmurs, rubs, or gallops,no edema  Respiratory:  clear to auscultation bilaterally, normal work of breathing GI: soft, nontender, nondistended, + BS MS: no deformity or atrophy  Skin: warm and dry, no rash Neuro:  Alert and Oriented x 3, Strength and sensation are intact Psych: euthymic mood, full affect  Wt Readings from Last 3 Encounters:  10/25/18 200 lb 12.8 oz (91.1 kg)  05/12/18 195 lb (88.5 kg)  04/23/18 192 lb 6 oz (87.3 kg)      Studies/Labs Reviewed:   EKG:  EKG is ordered today.  The ekg ordered today demonstrates NSR at rate of 66 bpm  Recent Labs: 11/10/2017: BUN 11; Creatinine 1.20; Hemoglobin 11.7; Platelet Count 235; Potassium 3.6; Sodium 137 04/26/2018: ALT 18   Lipid Panel    Component Value Date/Time   CHOL 203 (H) 04/26/2018 0845   TRIG 399 (H) 04/26/2018 0845   HDL 35 (L) 04/26/2018 0845   CHOLHDL 5.8 (H) 04/26/2018 0845   LDLCALC 88 04/26/2018 0845    Additional studies/ records that were reviewed today include:   Echocardiogram: 12/2017 Study Conclusions  - Left ventricle: The cavity size was normal. Wall thickness was   increased in a pattern of moderate LVH. Systolic function was   normal. The estimated ejection fraction was in the range of 50%   to 55%. Wall motion was normal; there were no regional wall   motion abnormalities. Doppler parameters are consistent with   abnormal left ventricular relaxation (grade 1 diastolic   dysfunction). Doppler parameters are consistent with   indeterminate ventricular filling pressure. - Aortic valve: Trileaflet; mildly calcified leaflets. - Mitral valve: Calcified annulus. Mildly thickened leaflets . - Atrial septum: No defect or patent foramen ovale was identified.  Cardiac Catheterization: 10/2016  Nuclear stress EF: 45%.  ST segment depression of 0.5 mm was noted during stress in the aVF, V5, V6, II and III leads.  Defect 1: There is a medium defect of severe  severity present in the basal inferior  location.  Defect 2: There is a small defect of moderate severity present in the apex location.  This is a low risk study.  Findings consistent with prior myocardial infarction with peri-infarct ischemia.  The left ventricular ejection fraction is mildly decreased (45-54%).   Low risk stress nuclear study with apical thinning and prior inferior basal infarct with mild peri-infarct ischemia; EF 45 with akinesis of the basal inferior wall; mild LVE.  ASSESSMENT & PLAN:    1. Syncope - No prodromal symptoms. LOC for 1 minutes. Witnessed by husband. Will get 30 days event monitor to r/o arrhthymias given remote hx of afib. Last echo 12/2017 showed LVEF of 50-55%, no wm abnormality. Prior hx of bradycardia, not on any rate control agent. No driving for 6 months per state law. Will update echocardiogram. Lab work by PCP reassuring. Pending carotid doppler.   2. PAF - maintaining sinus rhythm. No palpitations.   3. HTN - BP stable   The patient was seen by Dr. Acie Fredrickson and agree with Plan.    Medication Adjustments/Labs and Tests Ordered: Current medicines are reviewed at length with the patient today.  Concerns regarding medicines are outlined above.  Medication changes, Labs and Tests ordered today are listed in the Patient Instructions below. Patient Instructions  Medication Instructions:  Your physician recommends that you continue on your current medications as directed. Please refer to the Current Medication list given to you today.   Labwork: -None  Testing/Procedures: Your physician has requested that you have an echocardiogram. Echocardiography is a painless test that uses sound waves to create images of your heart. It provides your doctor with information about the size and shape of your heart and how well your heart's chambers and valves are working. This procedure takes approximately one hour. There are no restrictions for this procedure.  Your physician has recommended  that you wear an event monitor. Event monitors are medical devices that record the heart's electrical activity. Doctors most often [place] these monitors to diagnose arrhythmias. Arrhythmias are problems with the speed or rhythm of the heartbeat. The monitor is a small, portable device. You can wear one while you do your normal daily activities. This is usually used to diagnose what is causing palpitations/syncope (passing out).    Follow-Up: Your physician recommends that you keep your scheduled  follow-up appointment with Dr. Acie Fredrickson.   Any Other Special Instructions Will Be Listed Below (If Applicable).     If you need a refill on your cardiac medications before your next appointment, please call your pharmacy.      Jarrett Soho, [place]  10/25/2018 2:51 PM    [facility] [place], [place], [place]  [address] Phone: [phone]; Fax: [phone]   Attending Note:   The patient was seen and examined.  Agree with assessment and plan as noted above.  Changes made to the above note as needed.  Patient seen and independently examined with  Robbie Lis, PA .   We discussed all aspects of the encounter. I agree with the assessment and plan as stated above.  1.   Syncope: [name] presents following an episode of syncope last week.  She was standing up doing some light not just in her house.  She passed out and woke up between the couch in the chair.  She does not recall having any palpitations preceding this episode.  Recommended that she do not drive for 6  months. Place an event monitor on her. She is not having any current symptoms.   I have spent a total of 40 minutes with patient reviewing hospital  notes , telemetry, EKGs, labs and examining patient as well as establishing an assessment and plan that was discussed with the patient. > 50% of time was spent in direct patient care.    Thayer Headings, Brooke Bonito., MD, [facility] 10/25/2018, 5:40 PM [address],  [place] Pager [phone]

## 2018-10-26 ENCOUNTER — Ambulatory Visit (HOSPITAL_COMMUNITY)
Admission: RE | Admit: 2018-10-26 | Discharge: 2018-10-26 | Disposition: A | Discharge status: Home / Self Care | Payer: PPO | Source: Ambulatory Visit | Attending: Family | Admitting: Family

## 2018-10-26 DIAGNOSIS — R55 Syncope and collapse: Secondary | ICD-10-CM | POA: Insufficient documentation

## 2018-10-27 DIAGNOSIS — Z1212 Encounter for screening for malignant neoplasm of rectum: Secondary | ICD-10-CM | POA: Diagnosis not present

## 2018-10-28 DIAGNOSIS — M542 Cervicalgia: Secondary | ICD-10-CM | POA: Diagnosis not present

## 2018-11-10 DIAGNOSIS — M542 Cervicalgia: Secondary | ICD-10-CM | POA: Diagnosis not present

## 2018-11-11 ENCOUNTER — Telehealth: Payer: Self-pay | Admitting: *Deleted

## 2018-11-11 ENCOUNTER — Ambulatory Visit (HOSPITAL_COMMUNITY): Payer: PPO | Attending: Internal Medicine

## 2018-11-11 ENCOUNTER — Ambulatory Visit (INDEPENDENT_AMBULATORY_CARE_PROVIDER_SITE_OTHER): Payer: PPO

## 2018-11-11 DIAGNOSIS — I480 Paroxysmal atrial fibrillation: Secondary | ICD-10-CM

## 2018-11-11 DIAGNOSIS — R55 Syncope and collapse: Secondary | ICD-10-CM | POA: Insufficient documentation

## 2018-11-11 DIAGNOSIS — I10 Essential (primary) hypertension: Secondary | ICD-10-CM | POA: Diagnosis not present

## 2018-11-11 NOTE — Telephone Encounter (Signed)
Follow up    Patient returning call. Nurse unavailable.  Please call with echo results

## 2018-11-11 NOTE — Telephone Encounter (Signed)
-----   Message from [place], [place] sent at 11/11/2018  3:05 PM EST ----- Normal pumping function of heart. Mild stiffness of heart muscle. No significant valvular abnormality.

## 2018-11-11 NOTE — Telephone Encounter (Signed)
Called pt re: echocardiogram results, left a message for pt to call back.

## 2018-11-12 NOTE — Telephone Encounter (Signed)
Returned pt's call and she has been made aware of her echocardiogram results. See result note.  

## 2018-11-17 ENCOUNTER — Telehealth: Payer: Self-pay | Admitting: *Deleted

## 2018-11-17 DIAGNOSIS — M542 Cervicalgia: Secondary | ICD-10-CM | POA: Diagnosis not present

## 2018-11-17 NOTE — Telephone Encounter (Signed)
Called pt re [employer] sent serious recording from 11-16-18 @10 :57  Sinus Rhythm w/Run of V-Tach (5 beats) PACs Per pt felt fine was running errands maybe during that time not sure no symptoms ./cy

## 2018-11-17 NOTE — Telephone Encounter (Signed)
Duscussed with Vin Bhagat PA continue to wear monitor . No changes at this time /cy

## 2018-11-22 ENCOUNTER — Other Ambulatory Visit: Payer: Self-pay | Admitting: Oncology

## 2018-11-23 ENCOUNTER — Telehealth: Payer: Self-pay | Admitting: Oncology

## 2018-11-23 NOTE — Telephone Encounter (Signed)
Scheduled appt per 3/10 sch message - sent reminder letter in the mail with appt date and time

## 2018-11-24 DIAGNOSIS — M542 Cervicalgia: Secondary | ICD-10-CM | POA: Diagnosis not present

## 2018-12-01 DIAGNOSIS — M542 Cervicalgia: Secondary | ICD-10-CM | POA: Diagnosis not present

## 2018-12-10 ENCOUNTER — Ambulatory Visit: Payer: PPO

## 2018-12-27 ENCOUNTER — Telehealth: Payer: Self-pay | Admitting: Cardiovascular Disease

## 2018-12-27 NOTE — Telephone Encounter (Signed)
Called patient twice and was not able to speak with her regarding her 12/30/2018 virtual visit.  Call her son, Sharee Pimple (on [place]) who said that he would have her call me tomorrow morning around 8:30am to verify demographics and obtain consent for the visit.

## 2018-12-28 ENCOUNTER — Other Ambulatory Visit: Payer: Self-pay | Admitting: Physician Assistant

## 2018-12-28 NOTE — Telephone Encounter (Signed)
Spoke with patient who confirmed all demographics. She prefers a phone visit. She said that her son will set up her MyChart before her visit.  She does not have a computer or e-mail.

## 2018-12-30 ENCOUNTER — Encounter: Payer: Self-pay | Admitting: Cardiovascular Disease

## 2018-12-30 ENCOUNTER — Telehealth (INDEPENDENT_AMBULATORY_CARE_PROVIDER_SITE_OTHER): Payer: PPO | Admitting: Cardiovascular Disease

## 2018-12-30 ENCOUNTER — Other Ambulatory Visit: Payer: Self-pay

## 2018-12-30 VITALS — BP 120/65 | Ht 67.0 in | Wt 194.0 lb

## 2018-12-30 DIAGNOSIS — Z7189 Other specified counseling: Secondary | ICD-10-CM

## 2018-12-30 DIAGNOSIS — R55 Syncope and collapse: Secondary | ICD-10-CM | POA: Diagnosis not present

## 2018-12-30 DIAGNOSIS — I10 Essential (primary) hypertension: Secondary | ICD-10-CM

## 2018-12-30 MED ORDER — LISINOPRIL 20 MG PO TABS: 20.0000 mg | ORAL_TABLET | Freq: Every day | ORAL | 3 refills | Status: DC

## 2018-12-30 NOTE — Progress Notes (Signed)
Virtual Visit via Telephone Note    We attempted vidio visit but she was not able to access it on her iphone.   This visit type was conducted due to national recommendations for restrictions regarding the COVID-19 Pandemic (e.g. social distancing) in an effort to limit this patient's exposure and mitigate transmission in our community.  Due to her co-morbid illnesses, this patient is at least at moderate risk for complications without adequate follow up.  This format is felt to be most appropriate for this patient at this time.  The patient did not have access to video technology/had technical difficulties with video requiring transitioning to audio format only (telephone).  All issues noted in this document were discussed and addressed.  No physical exam could be performed with this format.  Please refer to the patient's chart for her  consent to telehealth for [facility].   Evaluation Performed:  Follow-up visit  Date:  12/30/2018   ID:  Melanie [name], Melanie [name] 10-22-1945, MRN [id number]  Patient Location: Home Provider Location: Home  PCP:  Marton Redwood, MD  Cardiologist:  [name], MD  Electrophysiologist:  None   Chief Complaint:    Palpitations, syncope  History of Present Illness:    Melanie [name] is a 73 y.o. female with a history of breast cancer and grade 1 diastolic dysfunction.  She has a history of hyperlipidemia but has been intolerant of statins.  She cannot afford a PCSK9 inhibitor.  She was seen in February by Robbie Lis, PA for an episode of syncope.  She was up dusting in the living room and remembers waking up between the couch in the chair.  She was out for about a minute.  There were no prodromal symptoms. We placed a 30-day event monitor on her. Her echocardiogram from November 11, 2018 reveals normal left ventricular systolic and with an ejection fraction of 50 to 55%.  She has grade 1 diastolic dysfunction.   Carotid duplex scan reveals mild  bilateral carotid artery disease but no significant plaque.  The 30-day event monitor revealed normal sinus rhythm.  She had one episode of 5 beat run of nonsustained ventricular tachycardia.  She had no other arrhythmias that would explain her episode of syncope.  Does have symptoms of orthostasis   Is on Imdur    Has been feeling better  Is active Does house work , takes care of husband. No syncope or presyncope  Is careful about orthostatic   No fever, cough, no aches Has allergies  Social distancing and wears her mask  The patient does not have symptoms concerning for COVID-19 infection (fever, chills, cough, or new shortness of breath).    Past Medical History:  Diagnosis Date  . Arthritis   . Bradycardia   . Depression    under control  . DM (diabetes mellitus) ([place])    type 2  . Dyspnea    with some exertion  . Family history of anesthesia complication    mother-nausea and vomiting  . Family history of breast cancer   . Family history of ovarian cancer   . GERD (gastroesophageal reflux disease)    occasional  . Headache   . HTN (hypertension)   . Neuropathy    legs and feet  . Neuropathy    feet  . PAF (paroxysmal atrial fibrillation) (HCC) 10 years ago   no problems since, due to stress  . Situational stress    "was taking care of sick husband"  Past Surgical History:  Procedure Laterality Date  . ABDOMINAL HYSTERECTOMY  1980   partial  . APPENDECTOMY  1980  . BACK SURGERY  2004  . BREAST LUMPECTOMY WITH RADIOACTIVE SEED LOCALIZATION Right 10/27/2017   Procedure: RIGHT BREAST PARTIAL MASTECTIOMY WITH RADIOACTIVE SEED LOCALIZATION [facility]AS PATHWAY;  Surgeon: Coralie Keens, MD;  Location: [facility];  Service: General;  Laterality: Right;  . CARDIAC CATHETERIZATION     in the late 90's/early 2000's  . CARPAL TUNNEL RELEASE Bilateral 2003, 2004  . CHOLECYSTECTOMY N/A 12/28/2014   Procedure: LAPAROSCOPIC CHOLECYSTECTOMY WITH INTRAOPERATIVE CHOLANGIOGRAM;   Surgeon: Armandina Gemma, MD;  Location: [facility];  Service: General;  Laterality: N/A;  . COLONOSCOPY  8 years ago  . JOINT REPLACEMENT Bilateral 2007  . KNEE ARTHROSCOPY Bilateral 1990's  . LAMINECTOMY  11/28/2016   L 3   . LUMBAR LAMINECTOMY N/A 07/20/2013   Procedure: MICRODISCECTOMY LUMBAR LAMINECTOMY;  Surgeon: [name], MD;  Location: [place];  Service: Orthopedics;  Laterality: N/A;  L2-3 Decompression  . LUMBAR LAMINECTOMY/DECOMPRESSION MICRODISCECTOMY N/A 11/28/2016   Procedure: L3 LAMINECTOMY, PARTIAL LAMINECTOMY WITH REMOVAL OF FREE FRAGMENT LEFT L3-4;  Surgeon: [name], MD;  Location: [place];  Service: Orthopedics;  Laterality: N/A;  . LUMBAR LAMINECTOMY/DECOMPRESSION MICRODISCECTOMY N/A 02/18/2017   Procedure: L3-4 MICRODISCECTOMY;  Surgeon: [name], MD;  Location: [place];  Service: Orthopedics;  Laterality: N/A;     Current Meds  Medication Sig  . anastrozole (ARIMIDEX) 1 MG tablet TAKE 1 TABLET BY MOUTH ONCE DAILY  . aspirin EC 81 MG tablet Take 81 mg by mouth daily.   . bisoprolol-hydrochlorothiazide (ZIAC) 5-6.25 MG tablet TAKE 1 TABLET BY MOUTH TWICE DAILY. PATIENT MUST KEEP UPCOMING APPOINTMENT FOR FUTHER REFILLS  . Cyanocobalamin (VITAMIN B-12 IJ) Inject 1,000 Units as directed every 30 (thirty) days.  Marland Kitchen gabapentin (NEURONTIN) 300 MG capsule Take 3 capsules (900 mg total) by mouth 3 (three) times daily.  Marland Kitchen glipiZIDE (GLUCOTROL) 5 MG tablet Take 5 mg by mouth 2 (two) times daily before a meal.   . insulin detemir (LEVEMIR) 100 UNIT/ML injection Inject 60 Units into the skin at bedtime.   . isosorbide mononitrate (IMDUR) 30 MG 24 hr tablet Take 1 tablet by mouth once daily  . lisinopril (PRINIVIL,ZESTRIL) 20 MG tablet Take 1 tablet (20 mg total) by mouth daily.  . metFORMIN (GLUCOPHAGE) 1000 MG tablet Take 1,000 mg by mouth 2 (two) times daily.  . nitroGLYCERIN (NITROSTAT) 0.4 MG SL tablet Place 1 tablet (0.4 mg total) under the tongue every 5 (five) minutes as needed.   . nortriptyline (PAMELOR) 10 MG capsule TAKE 4 CAPSULES BY MOUTH AT BEDTIME  . Omega-3 Fatty Acids (FISH OIL PO) Take 2,000 Units by mouth 2 (two) times daily.  . sertraline (ZOLOFT) 50 MG tablet Take 50 mg by mouth at bedtime.      Allergies:   Fenofibrate; Rosuvastatin; and Metoprolol   Social History   Tobacco Use  . Smoking status: Current Every Day Smoker    Packs/day: 0.50    Years: 30.00    Pack years: 15.00    Types: Cigarettes  . Smokeless tobacco: Never Used  Substance Use Topics  . Alcohol use: No    Alcohol/week: 0.0 standard drinks  . Drug use: No     Family Hx: The patient's family history includes Breast cancer in her sister; Cancer in her maternal uncle and maternal uncle; Cancer (age of onset: 47) in her cousin; Heart attack in her  father; Hypertension in her mother and sister; Multiple sclerosis in her sister; Neuropathy in her mother and sister; Other in her mother; Ovarian cancer in her sister; Stomach cancer in her paternal uncle; Stroke in her sister.  ROS:   Please see the history of present illness.     All other systems reviewed and are negative.   Prior CV studies:   The following studies were reviewed today:    Labs/Other Tests and Data Reviewed:    EKG:  No ECG reviewed.  Recent Labs: 04/26/2018: ALT 18   Recent Lipid Panel Lab Results  Component Value Date/Time   CHOL 203 (H) 04/26/2018 08:45 AM   TRIG 399 (H) 04/26/2018 08:45 AM   HDL 35 (L) 04/26/2018 08:45 AM   CHOLHDL 5.8 (H) 04/26/2018 08:45 AM   LDLCALC 88 04/26/2018 08:45 AM    Wt Readings from Last 3 Encounters:  12/30/18 194 lb (88 kg)  10/25/18 200 lb 12.8 oz (91.1 kg)  05/12/18 195 lb (88.5 kg)     Objective:    Vital Signs:  BP 120/65 (BP Location: Left Arm, Patient Position: Sitting, Cuff Size: Normal)   Ht 5\' 7"  (1.702 m)   Wt 194 lb (88 kg)   BMI 30.38 kg/m    Exam was not available - telephone visit  ASSESSMENT & PLAN:    1. Syncope:  Has not had any  further episodes of syncope.  She does have symptoms of orthostatic hypotension and although the specific episode of syncope did not like orthostasis, it could have contributed.  Her work-up including an event monitor, echocardiogram, carotid duplex scan all were unremarkable.  We will discontinue the isosorbide.  We will plan on seeing her again in 6 months for follow-up visit.  She will call us sooner if she has any additional problems.  COVID-19 Education: The signs and symptoms of COVID-19 were discussed with the patient and how to seek care for testing (follow up with PCP or arrange E-visit).  The importance of social distancing was discussed today.  Time:   Today, I have spent  25  minutes with the patient with telehealth technology discussing the above problems. + additional 14 minutes of charting / precharting     Medication Adjustments/Labs and Tests Ordered: Current medicines are reviewed at length with the patient today.  Concerns regarding medicines are outlined above.   Tests Ordered: No orders of the defined types were placed in this encounter.   Medication Changes: No orders of the defined types were placed in this encounter.   Disposition:  Follow up in 6 month(s)  Signed, [name], MD  12/30/2018 10:57 AM    [facility]

## 2018-12-30 NOTE — Patient Instructions (Signed)
Medication Instructions:  Your physician has recommended you make the following change in your medication:  STOP Imdur (isosorbide mononitrate)  If you need a refill on your cardiac medications before your next appointment, please call your pharmacy.    Lab work: None ordered   Testing/Procedures: None Ordered   Follow-Up: At [facility], you and your health needs are our priority.  As part of our continuing mission to provide you with exceptional heart care, we have created designated Provider Care Teams.  These Care Teams include your primary Cardiologist (physician) and Advanced Practice Providers (APPs -  Physician Assistants and Nurse Practitioners) who all work together to provide you with the care you need, when you need it. You will need a follow up appointment in:  6 months.  Please call our office 2 months in advance to schedule this appointment.  You may see [name], MD or one of the following Advanced Practice Providers on your designated Care Team: Richardson Dopp, PA-C [place], [place] . Daune Perch, NP

## 2019-01-05 ENCOUNTER — Ambulatory Visit: Payer: PPO

## 2019-01-31 ENCOUNTER — Other Ambulatory Visit: Payer: Self-pay | Admitting: Cardiovascular Disease

## 2019-02-08 ENCOUNTER — Ambulatory Visit: Payer: PPO | Admitting: Cardiovascular Disease

## 2019-02-17 ENCOUNTER — Telehealth: Payer: Self-pay | Admitting: Cardiovascular Disease

## 2019-02-17 DIAGNOSIS — F3342 Major depressive disorder, recurrent, in full remission: Secondary | ICD-10-CM | POA: Diagnosis not present

## 2019-02-17 DIAGNOSIS — N183 Chronic kidney disease, stage 3 (moderate): Secondary | ICD-10-CM | POA: Diagnosis not present

## 2019-02-17 DIAGNOSIS — E781 Pure hyperglyceridemia: Secondary | ICD-10-CM | POA: Diagnosis not present

## 2019-02-17 DIAGNOSIS — E1129 Type 2 diabetes mellitus with other diabetic kidney complication: Secondary | ICD-10-CM | POA: Diagnosis not present

## 2019-02-17 DIAGNOSIS — E1149 Type 2 diabetes mellitus with other diabetic neurological complication: Secondary | ICD-10-CM | POA: Diagnosis not present

## 2019-02-17 DIAGNOSIS — I129 Hypertensive chronic kidney disease with stage 1 through stage 4 chronic kidney disease, or unspecified chronic kidney disease: Secondary | ICD-10-CM | POA: Diagnosis not present

## 2019-02-17 DIAGNOSIS — E785 Hyperlipidemia, unspecified: Secondary | ICD-10-CM | POA: Diagnosis not present

## 2019-02-17 MED ORDER — ISOSORBIDE MONONITRATE ER 30 MG PO TB24: 30.0000 mg | ORAL_TABLET | Freq: Every day | ORAL | 3 refills | Status: DC

## 2019-02-17 NOTE — Telephone Encounter (Signed)
Agree with note by Christen Bame, RN to restart Imdur and to continue to monitor

## 2019-02-17 NOTE — Telephone Encounter (Signed)
New Message    Pt c/o medication issue:  1. Name of Medication: isosorbide   2. How are you currently taking this medication (dosage and times per day)? 30 mg pt is not currently taking   3. Are you having a reaction (difficulty breathing--STAT)? No   4. What is your medication issue? Pt says a couple weeks after she stopped this medication, her BP started to run high. This morning it was 161/104, second time was 158/89  Dr Brigitte Pulse told the pt she should go back on it but wants to follow up with Dr Acie Fredrickson first

## 2019-02-17 NOTE — Telephone Encounter (Signed)
Spoke with patient who states isosorbide mononitrate was stopped by Dr. Acie Fredrickson on 4/16 due to previous episode of syncope. She states her BP has been elevated for the past two weeks. Was told at diabetes clinic last week that it was high but does not remember the numbers. States BP today was 161/104 mmHg when she was taking it for her virtual visit with Dr. Brigitte Pulse. He had her recheck later and it was 138/73 mmHg.  She states typical BP for her prior to stopping Imdur was 130's over 70's. States Dr. Brigitte Pulse advised she restart Imdur but to call to make certain Dr. Acie Fredrickson agrees. I advised her to resume Imdur 30 mg and to continue to monitor BP. She denies dizziness or other concerns and states she felt better when she was taking the Imdur. She verbalized agreement to call back with questions or concerns and thanked me for the call.

## 2019-02-22 ENCOUNTER — Ambulatory Visit: Payer: PPO

## 2019-03-03 ENCOUNTER — Other Ambulatory Visit: Payer: Self-pay | Admitting: Oncology

## 2019-03-14 ENCOUNTER — Ambulatory Visit: Payer: PPO | Admitting: Oncology

## 2019-04-06 ENCOUNTER — Encounter: Payer: Self-pay | Admitting: Neurology

## 2019-04-08 ENCOUNTER — Telehealth (INDEPENDENT_AMBULATORY_CARE_PROVIDER_SITE_OTHER): Payer: PPO | Admitting: Neurology

## 2019-04-08 ENCOUNTER — Other Ambulatory Visit: Payer: Self-pay

## 2019-04-08 VITALS — Ht 67.0 in | Wt 190.0 lb

## 2019-04-08 DIAGNOSIS — R239 Unspecified skin changes: Secondary | ICD-10-CM

## 2019-04-08 DIAGNOSIS — G5621 Lesion of ulnar nerve, right upper limb: Secondary | ICD-10-CM

## 2019-04-08 DIAGNOSIS — E1142 Type 2 diabetes mellitus with diabetic polyneuropathy: Secondary | ICD-10-CM

## 2019-04-08 MED ORDER — NORTRIPTYLINE HCL 10 MG PO CAPS: ORAL_CAPSULE | ORAL | 3 refills | Status: DC

## 2019-04-08 MED ORDER — GABAPENTIN 300 MG PO CAPS: 900.0000 mg | ORAL_CAPSULE | Freq: Three times a day (TID) | ORAL | 3 refills | Status: DC

## 2019-04-08 NOTE — Progress Notes (Signed)
    Virtual Visit via Telephone Note The purpose of this virtual visit is to provide medical care while limiting exposure to the novel coronavirus.    Consent was obtained for phone visit:  Yes.   Answered questions that patient had about telehealth interaction:  Yes.   I discussed the limitations, risks, security and privacy concerns of performing an evaluation and management service by telephone. I also discussed with the patient that there may be a patient responsible charge related to this service. The patient expressed understanding and agreed to proceed.  Pt location: Home Physician Location: office Name of referring provider:  Marton Redwood, MD I connected with .Melanie [name] at patients initiation/request on 04/08/2019 at 11:10 AM EDT by telephone and verified that I am speaking with the correct person using two identifiers.  Pt MRN:  [id number] Pt DOB:  Nov 16, 1945   History of Present Illness: This is a 73 year-old female with hypertension, bradycardia, diabetes mellitus, paroxysmal atrial fibrillation, GERD, and L2-L3 disc protrusion s/p decompression (07/2013), right breast cancer s/p lumpectomy  returning for evaluation of diabetic neuropathy.  Over the past few months, she has noticed new tingling in the right little finger which is intermittent and worse when she is reading her book in her bed.  She does not have any hand weakness.  The numbness and stinging in her feet is still present and slightly worse than her last visit.  She is on gabapentin 900 mg 3 times daily and nortriptyline 40 mg at bedtime.  She has new complaints of skin changes, described as splotching over her shins at the end of the day, especially after she has been on her feet for prolonged period of time.  Her skin changes are associated with worsening numbness in the legs.  She continues to smoke, despite my strong recommendations for her to quit.  She has not discussed these symptoms with her PCP.  Assessment  and Plan:   1.  Painful diabetic neuropathy affecting the feet  -Continue gabapentin 900 mg 3 times daily and nortriptyline 40 mg at bedtime - refills provided  -She will need updated EKG if nortriptyline is to be increased going forward  2.  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  Bilateral leg skin changes and worsening numbness, ?  Claudication  -Recommend follow-up with PCP for consideration of arterial studies of the legs   Follow Up Instructions:   I discussed the assessment and treatment plan with the patient. The patient was provided an opportunity to ask questions and all were answered. The patient agreed with the plan and demonstrated an understanding of the instructions.   The patient was advised to call back or seek an in-person evaluation if the symptoms worsen or if the condition fails to improve as anticipated.   Total Time spent in visit with the patient was:  28 min, of which 100% of the time was spent in counseling and/or coordinating care.   Pt understands and agrees with the plan of care outlined.     [name], DO

## 2019-04-15 ENCOUNTER — Telehealth (HOSPITAL_COMMUNITY): Payer: Self-pay

## 2019-04-15 ENCOUNTER — Other Ambulatory Visit: Payer: Self-pay | Admitting: Vascular Surgery

## 2019-04-15 DIAGNOSIS — M79604 Pain in right leg: Secondary | ICD-10-CM

## 2019-04-15 NOTE — Telephone Encounter (Signed)
The above patient or their representative was contacted and gave the following answers to these questions:         Do you have any of the following symptoms? No  Fever                    Cough                   Shortness of breath  Do  you have any of the following other symptoms?  No   muscle pain         vomiting,        diarrhea        rash         weakness        red eye        abdominal pain         bruising          bruising or bleeding              joint pain           severe headache    Have you been in contact with someone who was or has been sick in the past 2 weeks? No  Yes                 Unsure                         Unable to assess   Does the person that you were in contact with have any of the following symptoms? N/A   Cough         shortness of breath           muscle pain         vomiting,            diarrhea            rash            weakness           fever            red eye           abdominal pain           bruising  or  bleeding                joint pain                severe headache                COMMENTS OR ACTION PLAN FOR THIS PATIENT:         

## 2019-04-18 ENCOUNTER — Ambulatory Visit (HOSPITAL_COMMUNITY)
Admission: RE | Admit: 2019-04-18 | Discharge: 2019-04-18 | Disposition: A | Discharge status: Home / Self Care | Payer: PPO | Source: Ambulatory Visit | Attending: Family | Admitting: Family

## 2019-04-18 ENCOUNTER — Other Ambulatory Visit: Payer: Self-pay

## 2019-04-18 DIAGNOSIS — M79604 Pain in right leg: Secondary | ICD-10-CM | POA: Insufficient documentation

## 2019-04-18 DIAGNOSIS — M79605 Pain in left leg: Secondary | ICD-10-CM | POA: Diagnosis not present

## 2019-04-25 ENCOUNTER — Telehealth: Payer: Self-pay | Admitting: Cardiovascular Disease

## 2019-04-25 ENCOUNTER — Ambulatory Visit: Payer: Medicare Other | Admitting: Neurology

## 2019-04-25 ENCOUNTER — Telehealth: Payer: Self-pay | Admitting: Pharmacist

## 2019-04-25 DIAGNOSIS — E782 Mixed hyperlipidemia: Secondary | ICD-10-CM

## 2019-04-25 MED ORDER — PRALUENT 75 MG/ML ~~LOC~~ SOAJ: 1.0000 "pen " | SUBCUTANEOUS | 11 refills | Status: DC

## 2019-04-25 NOTE — Telephone Encounter (Signed)
Called pt - she had a question about the Praluent copay, advised her it will be free. Scheduled f/u labs in the end of September.

## 2019-04-25 NOTE — Telephone Encounter (Signed)
Called pt to discuss lipids - she was previously approved for Praluent, however copay was cost prohibitive. Discussed newer [employer] - applied for pt and received approval for $2500 in copay assistance. She will now be able to start Praluent injections for free. Will recheck labs in 2-3 months.

## 2019-04-25 NOTE — Telephone Encounter (Signed)
  Patient would like Megan to give her a call because she has some questions regarding earlier conversation

## 2019-04-28 ENCOUNTER — Telehealth: Payer: Self-pay | Admitting: Pharmacist

## 2019-04-28 NOTE — Telephone Encounter (Signed)
Patient will come in Monday at 10:30 for injection. We will use a sample we have since cap has been removed from hers for too long.

## 2019-04-28 NOTE — Telephone Encounter (Signed)
Pt called clinic and left a message - she is having trouble giving her first Praluent injection. Returned call to pt and left a message to see if she would like to come to clinic to give her first shot or review technique over the phone.

## 2019-05-02 NOTE — Telephone Encounter (Signed)
Patient presented to help with Praluent injection. Walked patient through injection technique with demo pen. When patient went to inject into belly, she pulled the pen out before it was finished injecting. Advised patient because we do not know how much she injected, to wait 2 weeks to inject another pen.  Patient educated again on waiting until the viewfinder turns yellow and the pen clicks for the second time before removing pen.  Also advised patient that she sit while injecting (she did not want to sit the first time bc she said she gives her insulin standing up. Also suggested she try the thigh. Patient showed proper technique with the demo pen before leaving

## 2019-05-04 DIAGNOSIS — E538 Deficiency of other specified B group vitamins: Secondary | ICD-10-CM | POA: Diagnosis not present

## 2019-05-31 ENCOUNTER — Telehealth: Payer: Self-pay | Admitting: Pharmacist

## 2019-05-31 MED ORDER — PRALUENT 75 MG/ML ~~LOC~~ SOAJ: 1.0000 "pen " | SUBCUTANEOUS | 11 refills | Status: DC

## 2019-05-31 NOTE — Telephone Encounter (Signed)
[facility] in [place] able to order Praluent just fine, new rx sent in, faxed over [facility] aid, pt is aware.

## 2019-05-31 NOTE — Telephone Encounter (Signed)
Pt called clinic regarding her Praluent injections - she states she has tried contacting [facility], [facility], and [facility] and none of them are able to order it. Will reach out to [facility] to see if they are able to order. Pt will pick up 2 Praluent samples tomorrow.

## 2019-06-01 DIAGNOSIS — E538 Deficiency of other specified B group vitamins: Secondary | ICD-10-CM | POA: Diagnosis not present

## 2019-06-02 ENCOUNTER — Other Ambulatory Visit: Payer: Self-pay | Admitting: Oncology

## 2019-06-02 ENCOUNTER — Telehealth: Payer: Self-pay | Admitting: Oncology

## 2019-06-02 NOTE — Telephone Encounter (Signed)
Advise refill.  No future appointments.

## 2019-06-02 NOTE — Telephone Encounter (Signed)
Scheduled appt per 9/17 sch message- unable to reach pt . Scheduled next available for yearly follow up for pt . Unable to leave message mailed letter with appt date and time

## 2019-06-03 ENCOUNTER — Encounter: Payer: Self-pay | Admitting: Pharmacist

## 2019-06-06 NOTE — Telephone Encounter (Signed)
Please refill anastrozole, 90 tabs, 1 refill; please note when contacted this way I am not able to automatically refill pt's request  Thank you!  GM

## 2019-06-13 ENCOUNTER — Other Ambulatory Visit: Payer: PPO

## 2019-06-20 ENCOUNTER — Other Ambulatory Visit: Payer: Self-pay | Admitting: Internal Medicine

## 2019-06-20 DIAGNOSIS — E538 Deficiency of other specified B group vitamins: Secondary | ICD-10-CM | POA: Diagnosis not present

## 2019-06-20 DIAGNOSIS — E785 Hyperlipidemia, unspecified: Secondary | ICD-10-CM | POA: Diagnosis not present

## 2019-06-20 DIAGNOSIS — E162 Hypoglycemia, unspecified: Secondary | ICD-10-CM | POA: Diagnosis not present

## 2019-06-20 DIAGNOSIS — I129 Hypertensive chronic kidney disease with stage 1 through stage 4 chronic kidney disease, or unspecified chronic kidney disease: Secondary | ICD-10-CM | POA: Diagnosis not present

## 2019-06-20 DIAGNOSIS — N1832 Chronic kidney disease, stage 3b: Secondary | ICD-10-CM | POA: Diagnosis not present

## 2019-06-20 DIAGNOSIS — G629 Polyneuropathy, unspecified: Secondary | ICD-10-CM | POA: Diagnosis not present

## 2019-06-20 DIAGNOSIS — Z1231 Encounter for screening mammogram for malignant neoplasm of breast: Secondary | ICD-10-CM

## 2019-06-20 DIAGNOSIS — E1129 Type 2 diabetes mellitus with other diabetic kidney complication: Secondary | ICD-10-CM | POA: Diagnosis not present

## 2019-06-20 DIAGNOSIS — F3342 Major depressive disorder, recurrent, in full remission: Secondary | ICD-10-CM | POA: Diagnosis not present

## 2019-06-20 DIAGNOSIS — E1149 Type 2 diabetes mellitus with other diabetic neurological complication: Secondary | ICD-10-CM | POA: Diagnosis not present

## 2019-07-11 ENCOUNTER — Other Ambulatory Visit: Payer: PPO

## 2019-07-15 ENCOUNTER — Ambulatory Visit: Payer: PPO | Admitting: Cardiovascular Disease

## 2019-07-27 NOTE — Progress Notes (Signed)
[facility]  [phone] [phone]     ID: Melanie [name] DOB: 1946-08-03  MR#: [id number]  XQJ#:[id number]  Patient Care Team: Marton Redwood, MD as PCP - General (Internal Medicine) Nahser, Wonda Cheng, MD as PCP - Cardiology (Cardiology) Cordarrius Coad, [name], MD as Consulting Physician (Oncology) Coralie Keens, MD as Consulting Physician (General Surgery) [name], DO as Consulting Physician (Neurology) Paula Compton, MD as Consulting Physician (Obstetrics and Gynecology) [name], MD as Consulting Physician (Orthopedic Surgery) OTHER MD:  CHIEF COMPLAINT: Atypical ductal hyperplasia, lobular carcinoma in situ  CURRENT TREATMENT: Anastrozole   INTERVAL HISTORY: Melanie [name] returns today for follow-up and treatment of her atypical ductal hyperplasia, 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   Unfortunately the patient's husband was found to have what sounds like esophageal cancer, metastatic at presentation, and died within 2 weeks of diagnosis.  Melanie [name] is appropriately distraught and grieving at this point.  She continues on anastrozole.  Hot flashes and vaginal dryness are not a problem with that medication.  Since her last visit, she underwent cervical spine x-ray on 08/30/2018 for left-sided neck pain. This showed: space well maintained; normal lordotic curvature; no acute fractures; joint changes C4-6 with endplate spurring.  She also underwent repeat echocardiogram on 11/11/2018. This showed an ejection fraction of 50-55%, which is stable from prior in 12/2017.  She is scheduled to undergo screening mammography on 08/04/2019.   REVIEW OF SYSTEMS: Melanie [name] lives by herself.  She tells me one of her sons calls her every day and the other 1 dropped by every other day.  Her husband's death was at home, with the help of hospice helping, and with the family in attendance.  She tells me that everybody has been very careful as far as the pandemic is  concerned.  She herself has had no suspicious symptoms.  A detailed review of systems was otherwise stable.   HISTORY OF CURRENT ILLNESS: From the original intake note:  Melanie [name] had routine screening mammography on 09/22/2017 showing a possible abnormality in the right breast. She underwent bilateral diagnostic mammography with tomography and right breast ultrasonography at [facility] on 09/25/2017 showing: Indeterminate right breast mass in the central lateral right breast at middle depth. identified mammographically without associated ultrasound correlate. Evaluation of the right axilla demonstrates no suspicious adenopathy.   Accordingly on 10/01/2017 she proceeded to biopsy of the right breast area in question. The pathology from this procedure showed ([id number]): lobular carcinoma in situ. Atypical ductal hyperplasia. Fibrocystic changes.   On 10/27/2017, she underwent right breast lumpectomy showing ([id number]) focal atypical ductal hyperplasia and sclerosing adenosis. Fibroadenoma. Previous biopsy site and biopsy clip. No residual lobular carcinoma in situ. No invasive carcinoma.   The patient's subsequent history is as detailed below.   PAST MEDICAL HISTORY: Past Medical History:  Diagnosis Date  . Arthritis   . Bradycardia   . Depression    under control  . DM (diabetes mellitus) ([place])    type 2  . Dyspnea    with some exertion  . Family history of anesthesia complication    mother-nausea and vomiting  . Family history of breast cancer   . Family history of ovarian cancer   . GERD (gastroesophageal reflux disease)    occasional  . Headache   . HTN (hypertension)   . Neuropathy    legs and feet  . Neuropathy    feet  . PAF (paroxysmal atrial fibrillation) (H[facility]) 10 years ago   no  problems since, due to stress  . Situational stress    "was taking care of sick husband"  stress related heart palpitations in 1998   PAST SURGICAL HISTORY: Past Surgical  History:  Procedure Laterality Date  . ABDOMINAL HYSTERECTOMY  1980   partial  . APPENDECTOMY  1980  . BACK SURGERY  2004  . BREAST LUMPECTOMY WITH RADIOACTIVE SEED LOCALIZATION Right 10/27/2017   Procedure: RIGHT BREAST PARTIAL MASTECTIOMY WITH RADIOACTIVE SEED LOCALIZATION ERAS PATHWAY;  Surgeon: Coralie Keens, MD;  Location: [facility];  Service: General;  Laterality: Right;  . CARDIAC CATHETERIZATION     in the late 90's/early 2000's  . CARPAL TUNNEL RELEASE Bilateral 2003, 2004  . CHOLECYSTECTOMY N/A 12/28/2014   Procedure: LAPAROSCOPIC CHOLECYSTECTOMY WITH INTRAOPERATIVE CHOLANGIOGRAM;  Surgeon: Armandina Gemma, MD;  Location: [facility];  Service: General;  Laterality: N/A;  . COLONOSCOPY  8 years ago  . JOINT REPLACEMENT Bilateral 2007  . KNEE ARTHROSCOPY Bilateral 1990's  . LAMINECTOMY  11/28/2016   L 3   . LUMBAR LAMINECTOMY N/A 07/20/2013   Procedure: MICRODISCECTOMY LUMBAR LAMINECTOMY;  Surgeon: [name], MD;  Location: [facility];  Service: Orthopedics;  Laterality: N/A;  L2-3 Decompression  . LUMBAR LAMINECTOMY/DECOMPRESSION MICRODISCECTOMY N/A 11/28/2016   Procedure: L3 LAMINECTOMY, PARTIAL LAMINECTOMY WITH REMOVAL OF FREE FRAGMENT LEFT L3-4;  Surgeon: [name], MD;  Location: [facility];  Service: Orthopedics;  Laterality: N/A;  . LUMBAR LAMINECTOMY/DECOMPRESSION MICRODISCECTOMY N/A 02/18/2017   Procedure: L3-4 MICRODISCECTOMY;  Surgeon: [name], MD;  Location: [facility];  Service: Orthopedics;  Laterality: N/A;    FAMILY HISTORY Family History  Problem Relation Age of Onset  . Heart attack Father        Deceased, 95  . Other Mother        Deceased, 11  . Neuropathy Mother   . Hypertension Mother   . Breast cancer Sister        55's  . Ovarian cancer Sister        dx and died in 82's  . Multiple sclerosis Sister   . Neuropathy Sister   . Stroke Sister   . Hypertension Sister   . Cancer Maternal Uncle        dx >50, type unk  . Stomach cancer Paternal Uncle        dx >50   . Cancer Maternal Uncle        dx >50, type unk  . Cancer Cousin 60       type of cancer unk  The patient's father died of a heart attack at age 44. The patient's mother died due to old age at 65.  The patient has no brothers and 3 sisters. The patient's youngest sister had breast cancer and passed away 5 years later due to ovarian cancer, which was diagnosed in her early 57's.  The patient denies any other family members with  breast or ovarian cancer.    GYNECOLOGIC HISTORY:  Menarche: 73 years old Age at first live birth: 74 years old [place] P2 LMP: status post hysterectomy without BSO at age 7 She used contraceptives for about 2 years. She did not use HRTs.   SOCIAL HISTORY:  Melanie [name] is now retired from working in a [employer] in [place]. She now takes care of her husband, Melanie [name], who has been disabled with neck issues for the last 20 years. Melanie [name] used to work in [employer] and was injured on the job. At home is her husband,  2 dogs, and a cat. The patient's oldest son, Melanie [name], works for the [employer] and lives in [place], [place].  The patient's youngest son, Melanie [name], works at [employer] in [place]. The patient has 3 grandchildren. She attends [employer].    ADVANCED DIRECTIVES:   HEALTH MAINTENANCE: Social History   Tobacco Use  . Smoking status: Current Every Day Smoker    Packs/day: 0.50    Years: 30.00    Pack years: 15.00    Types: Cigarettes  . Smokeless tobacco: Never Used  Substance Use Topics  . Alcohol use: No    Alcohol/week: 0.0 standard drinks  . Drug use: No     Colonoscopy: 10 years ago at [facility]/ normal  PAP: s/p hysterctomy  Bone density: at Dr. Brigitte Pulse, normal per patient's report   Allergies  Allergen Reactions  . Fenofibrate Other (See Comments)    "muscle spasms"  . Rosuvastatin     Muscle aches  . Metoprolol Other (See Comments)    Headache    Current Outpatient Medications  Medication Sig Dispense  Refill  . Alirocumab (PRALUENT) 75 MG/ML SOAJ Inject 1 pen into the skin every 14 (fourteen) days. 2 pen 11  . anastrozole (ARIMIDEX) 1 MG tablet Take 1 tablet by mouth once daily 90 tablet 0  . aspirin EC 81 MG tablet Take 81 mg by mouth daily.     . bisoprolol-hydrochlorothiazide (ZIAC) 5-6.25 MG tablet Take 1 tablet by mouth twice daily 180 tablet 3  . Cyanocobalamin (VITAMIN B-12 IJ) Inject 1,000 Units as directed every 30 (thirty) days.    Marland Kitchen gabapentin (NEURONTIN) 300 MG capsule Take 3 capsules (900 mg total) by mouth 3 (three) times daily. 810 capsule 3  . glipiZIDE (GLUCOTROL) 5 MG tablet Take 5 mg by mouth 2 (two) times daily before a meal.     . insulin detemir (LEVEMIR) 100 UNIT/ML injection Inject 70 Units into the skin at bedtime.     . isosorbide mononitrate (IMDUR) 30 MG 24 hr tablet Take 1 tablet (30 mg total) by mouth daily. 90 tablet 3  . lisinopril (PRINIVIL,ZESTRIL) 20 MG tablet Take 1 tablet (20 mg total) by mouth daily. 90 tablet 3  . metFORMIN (GLUCOPHAGE) 1000 MG tablet Take 1,000 mg by mouth 2 (two) times daily.    . nitroGLYCERIN (NITROSTAT) 0.4 MG SL tablet Place 1 tablet (0.4 mg total) under the tongue every 5 (five) minutes as needed. 25 tablet 3  . nortriptyline (PAMELOR) 10 MG capsule TAKE 4 CAPSULES BY MOUTH AT BEDTIME 360 capsule 3  . Omega-3 Fatty Acids (FISH OIL PO) Take 2,000 Units by mouth 2 (two) times daily.    . rosuvastatin (CRESTOR) 10 MG tablet Take 10 mg by mouth once a week.    . sertraline (ZOLOFT) 50 MG tablet Take 50 mg by mouth at bedtime.      No current facility-administered medications for this visit.     OBJECTIVE: Middle-aged white woman who appears older than stated age  53:   07/28/19 1128  BP: (!) 136/56  Pulse: 72  Resp: 18  Temp: 98.7 F (37.1 C)  SpO2: 98%     Body mass index is 31.51 kg/m.   Wt Readings from Last 3 Encounters:  07/28/19 201 lb 3.2 oz (91.3 kg)  04/06/19 190 lb (86.2 kg)  12/30/18 194 lb (88 kg)       ECOG FS:1 - Symptomatic but completely ambulatory  Sclerae unicteric, EOMs intact Wearing a mask  No cervical or supraclavicular adenopathy Lungs no rales or rhonchi Heart regular rate and rhythm Abd soft, nontender, positive bowel sounds MSK no focal spinal tenderness, no upper extremity lymphedema Neuro: nonfocal, well oriented, appropriate affect Breasts: The right breast is status post lumpectomy.  There are no suspicious findings.  Left breast is benign.  Both axillae are benign.   LAB RESULTS:  CMP     Component Value Date/Time   NA 137 11/10/2017 1504   K 3.6 11/10/2017 1504   CL 102 11/10/2017 1504   CO2 22 11/10/2017 1504   GLUCOSE 192 (H) 11/10/2017 1504   BUN 11 11/10/2017 1504   CREATININE 1.20 (H) 11/10/2017 1504   CALCIUM 9.4 11/10/2017 1504   PROT 7.0 04/26/2018 0845   ALBUMIN 4.2 04/26/2018 0845   AST 19 04/26/2018 0845   AST 10 11/10/2017 1504   ALT 18 04/26/2018 0845   ALT 10 11/10/2017 1504   ALKPHOS 64 04/26/2018 0845   BILITOT 0.2 04/26/2018 0845   BILITOT 0.3 11/10/2017 1504   GFRNONAA 45 (L) 11/10/2017 1504   GFRAA 52 (L) 11/10/2017 1504    Lab Results  Component Value Date   TOTALPROTELP 7.4 09/22/2013   ALBUMINELP 61.6 09/22/2013   A1GS 3.4 09/22/2013   A2GS 11.5 09/22/2013   BETS 7.1 09/22/2013   BETA2SER 4.2 09/22/2013   GAMS 12.2 09/22/2013   MSPIKE NOT DET 09/22/2013   SPEI  09/22/2013     Comment:     The possibility of a faint restricted band(s) cannot be completely excluded in the gamma region.  Suggest serum IFE to evaluate possibility, if clinically indicated. (Lab will hold sample one week. Please call Customer Service at [phone] to add test.) Reviewed by Odis Hollingshead, MD, PhD, FCAP (Electronic Signature on File)     Lab Results  Component Value Date   [facility] 8.29 09/22/2013    Lab Results  Component Value Date   WBC 7.6 11/10/2017   NEUTROABS 4.7 11/10/2017   HGB 11.7 11/10/2017   HCT 35.4  11/10/2017   MCV 93.7 11/10/2017   PLT 235 11/10/2017    _0 @  No results found for: LABCA2  No components found for: HRVACQ584  No results for input(s): INR in the last 168 hours.  No results found for: LABCA2  No results found for: KLT075  No results found for: PBA256  No results found for: HCS919  No results found for: CA2729  No components found for: HGQUANT  No results found for: CEA1 / No results found for: CEA1   No results found for: AFPTUMOR  No results found for: CHROMOGRNA  No results found for: PSA1  No visits with results within 3 Day(s) from this visit.  Latest known visit with results is:  Lab on 04/26/2018  Component Date Value Ref Range Status  . Cholesterol, Total 04/26/2018 203* 100 - 199 mg/dL Final  . Triglycerides 04/26/2018 399* 0 - 149 mg/dL Final  . HDL 04/26/2018 35* >39 mg/dL Final  . VLDL Cholesterol Cal 04/26/2018 80* 5 - 40 mg/dL Final  . LDL Calculated 04/26/2018 88  0 - 99 mg/dL Final  . Chol/HDL Ratio 04/26/2018 5.8* 0.0 - 4.4 ratio Final   Comment:                                   T. Chol/HDL Ratio  Men  Women                               1/2 Avg.Risk  3.4    3.3                                   Avg.Risk  5.0    4.4                                2X Avg.Risk  9.6    7.1                                3X Avg.Risk 23.4   11.0   . Total Protein 04/26/2018 7.0  6.0 - 8.5 g/dL Final  . Albumin 04/26/2018 4.2  3.6 - 4.8 g/dL Final  . Bilirubin Total 04/26/2018 0.2  0.0 - 1.2 mg/dL Final  . Bilirubin, Direct 04/26/2018 0.08  0.00 - 0.40 mg/dL Final  . Alkaline Phosphatase 04/26/2018 64  39 - 117 IU/L Final  . AST 04/26/2018 19  0 - 40 IU/L Final  . ALT 04/26/2018 18  0 - 32 IU/L Final    (this displays the last labs from the last 3 days)  Lab Results  Component Value Date   TOTALPROTELP 7.4 09/22/2013   ALBUMINELP 61.6 09/22/2013   A1GS 3.4 09/22/2013   A2GS 11.5  09/22/2013   BETS 7.1 09/22/2013   BETA2SER 4.2 09/22/2013   GAMS 12.2 09/22/2013   MSPIKE NOT DET 09/22/2013   SPEI  09/22/2013     Comment:     The possibility of a faint restricted band(s) cannot be completely excluded in the gamma region.  Suggest serum IFE to evaluate possibility, if clinically indicated. (Lab will hold sample one week. Please call Customer Service at [phone] to add test.) Reviewed by Odis Hollingshead, MD, PhD, FCAP (Electronic Signature on File)   (this displays SPEP labs)  Lab Results  Component Value Date   KAPLAMBRATIO 8.29 09/22/2013   (kappa/lambda light chains)  No results found for: HGBA, HGBA2QUANT, HGBFQUANT, HGBSQUAN (Hemoglobinopathy evaluation)   No results found for: LDH  No results found for: IRON, TIBC, IRONPCTSAT (Iron and TIBC)  No results found for: FERRITIN  Urinalysis    Component Value Date/Time   COLORURINE YELLOW 06/05/2017 0052   APPEARANCEUR HAZY (A) 06/05/2017 0052   LABSPEC 1.015 06/05/2017 0052   PHURINE 5.0 06/05/2017 0052   GLUCOSEU NEGATIVE 06/05/2017 0052   HGBUR NEGATIVE 06/05/2017 0052   BILIRUBINUR NEGATIVE 06/05/2017 0052   KETONESUR NEGATIVE 06/05/2017 0052   PROTEINUR NEGATIVE 06/05/2017 0052   NITRITE NEGATIVE 06/05/2017 0052   LEUKOCYTESUR MODERATE (A) 06/05/2017 0052     STUDIES: No results found.  ELIGIBLE FOR AVAILABLE RESEARCH PROTOCOL: no  ASSESSMENT: 73 y.o. Melanie [name] woman status post right breast biopsy 10/01/2017 showing lobular carcinoma in situ and atypical ductal hyperplasia  (1) right lumpectomy 10/27/2017 shows focal atypical ductal hyperplasia  (2) additional cancer risk factors:   (a) family history of breast and ovarian cancer  (b) breast density category C  PLAN: (3) anastrozole started 11/09/2017  (a) baseline bone density reportedly normal  (4) intensified screening   (a) breast MRI 11/30/2017 showed no evidence of malignancy  (  b) intensified  screening discontinued after her November 2020 for financial reasons  (5) genetics testing 01/27/2018 identified a 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 STK11, TP53, TSC1, TSC2, and VHL.  The following genes were evaluated for sequence changes only: SDHA and HOXB13 c.251G>A variant only.  PLAN: Melanie [name] is doing well on the anastrozole and the plan will be to continue that for total of 5 years.  She would be due for repeat breast MRI in May.  However her insurance has changed.  We discussed this in great detail and particularly breast density of category B I think we can forego MRI at least next year, possibly considered the following year depending on insurance issues.  She will have mammography later this month.  We discussed her husband's death in detail.  She has good family support.  She does live by herself.  She is grieving appropriately and does not seem clinically depressed.  She was counseled regarding falls as well as pandemic precautions  She knows to call for any other issue that may develop before the next visit here.    Mekayla Soman, [name], MD  07/28/19 5:53 PM Medical Oncology and Hematology [facility] [place], [place] [address] Tel. [phone]    Fax. [phone]    I, Wilburn Mylar, am acting as scribe for Dr. [name]. Jody Aguinaga.  I, Lurline Del MD, have reviewed the above documentation for accuracy and completeness, and I  agree with the above.

## 2019-07-28 ENCOUNTER — Inpatient Hospital Stay: Payer: PPO | Attending: Oncology | Admitting: Oncology

## 2019-07-28 ENCOUNTER — Other Ambulatory Visit: Payer: Self-pay

## 2019-07-28 VITALS — BP 136/56 | HR 72 | Temp 98.7°F | Resp 18 | Ht 67.0 in | Wt 201.2 lb

## 2019-07-28 DIAGNOSIS — Z79899 Other long term (current) drug therapy: Secondary | ICD-10-CM | POA: Insufficient documentation

## 2019-07-28 DIAGNOSIS — F329 Major depressive disorder, single episode, unspecified: Secondary | ICD-10-CM | POA: Insufficient documentation

## 2019-07-28 DIAGNOSIS — M1990 Unspecified osteoarthritis, unspecified site: Secondary | ICD-10-CM | POA: Insufficient documentation

## 2019-07-28 DIAGNOSIS — Z823 Family history of stroke: Secondary | ICD-10-CM | POA: Diagnosis not present

## 2019-07-28 DIAGNOSIS — Z8041 Family history of malignant neoplasm of ovary: Secondary | ICD-10-CM | POA: Diagnosis not present

## 2019-07-28 DIAGNOSIS — Z79811 Long term (current) use of aromatase inhibitors: Secondary | ICD-10-CM | POA: Insufficient documentation

## 2019-07-28 DIAGNOSIS — F17210 Nicotine dependence, cigarettes, uncomplicated: Secondary | ICD-10-CM | POA: Diagnosis not present

## 2019-07-28 DIAGNOSIS — C50911 Malignant neoplasm of unspecified site of right female breast: Secondary | ICD-10-CM | POA: Diagnosis not present

## 2019-07-28 DIAGNOSIS — Z8249 Family history of ischemic heart disease and other diseases of the circulatory system: Secondary | ICD-10-CM | POA: Diagnosis not present

## 2019-07-28 DIAGNOSIS — N6091 Unspecified benign mammary dysplasia of right breast: Secondary | ICD-10-CM | POA: Diagnosis not present

## 2019-07-28 DIAGNOSIS — Z809 Family history of malignant neoplasm, unspecified: Secondary | ICD-10-CM | POA: Insufficient documentation

## 2019-07-28 DIAGNOSIS — Z800 Family history of malignant neoplasm of digestive organs: Secondary | ICD-10-CM | POA: Diagnosis not present

## 2019-07-28 DIAGNOSIS — Z8269 Family history of other diseases of the musculoskeletal system and connective tissue: Secondary | ICD-10-CM | POA: Diagnosis not present

## 2019-07-28 DIAGNOSIS — Z820 Family history of epilepsy and other diseases of the nervous system: Secondary | ICD-10-CM | POA: Insufficient documentation

## 2019-07-28 DIAGNOSIS — D0501 Lobular carcinoma in situ of right breast: Secondary | ICD-10-CM | POA: Diagnosis not present

## 2019-07-28 DIAGNOSIS — Z803 Family history of malignant neoplasm of breast: Secondary | ICD-10-CM | POA: Diagnosis not present

## 2019-08-01 ENCOUNTER — Telehealth: Payer: Self-pay | Admitting: Oncology

## 2019-08-01 NOTE — Telephone Encounter (Signed)
I could not reach patient regarding schedule  °

## 2019-08-04 ENCOUNTER — Other Ambulatory Visit: Payer: Self-pay

## 2019-08-04 ENCOUNTER — Ambulatory Visit
Admission: RE | Admit: 2019-08-04 | Discharge: 2019-08-04 | Disposition: A | Discharge status: Home / Self Care | Payer: PPO | Source: Ambulatory Visit | Attending: Internal Medicine | Admitting: Internal Medicine

## 2019-08-04 DIAGNOSIS — Z1231 Encounter for screening mammogram for malignant neoplasm of breast: Secondary | ICD-10-CM | POA: Diagnosis not present

## 2019-08-04 DIAGNOSIS — E538 Deficiency of other specified B group vitamins: Secondary | ICD-10-CM | POA: Diagnosis not present

## 2019-08-04 DIAGNOSIS — Z23 Encounter for immunization: Secondary | ICD-10-CM | POA: Diagnosis not present

## 2019-08-08 ENCOUNTER — Other Ambulatory Visit: Payer: Self-pay | Admitting: Internal Medicine

## 2019-08-08 DIAGNOSIS — R928 Other abnormal and inconclusive findings on diagnostic imaging of breast: Secondary | ICD-10-CM

## 2019-08-16 ENCOUNTER — Ambulatory Visit
Admission: RE | Admit: 2019-08-16 | Discharge: 2019-08-16 | Disposition: A | Discharge status: Home / Self Care | Payer: PPO | Source: Ambulatory Visit | Attending: Internal Medicine | Admitting: Internal Medicine

## 2019-08-16 ENCOUNTER — Ambulatory Visit: Payer: PPO

## 2019-08-16 ENCOUNTER — Other Ambulatory Visit: Payer: Self-pay

## 2019-08-16 DIAGNOSIS — R928 Other abnormal and inconclusive findings on diagnostic imaging of breast: Secondary | ICD-10-CM

## 2019-08-16 DIAGNOSIS — R922 Inconclusive mammogram: Secondary | ICD-10-CM | POA: Diagnosis not present

## 2019-08-17 ENCOUNTER — Ambulatory Visit: Payer: PPO | Admitting: Adult Health

## 2019-08-17 ENCOUNTER — Other Ambulatory Visit: Payer: PPO

## 2019-08-19 ENCOUNTER — Other Ambulatory Visit: Payer: PPO | Admitting: *Deleted

## 2019-08-19 ENCOUNTER — Other Ambulatory Visit: Payer: Self-pay

## 2019-08-19 DIAGNOSIS — E782 Mixed hyperlipidemia: Secondary | ICD-10-CM

## 2019-08-19 LAB — LIPID PANEL
Chol/HDL Ratio: 2.7 ratio (ref 0.0–4.4)
Cholesterol, Total: 94 mg/dL — ABNORMAL LOW (ref 100–199)
HDL: 35 mg/dL — ABNORMAL LOW (ref >39)
LDL Chol Calc (NIH): 25 mg/dL (ref 0–99)
Triglycerides: 222 mg/dL — ABNORMAL HIGH (ref 0–149)
VLDL Cholesterol Cal: 34 mg/dL (ref 5–40)

## 2019-08-19 LAB — HEPATIC FUNCTION PANEL
ALT: 22 IU/L (ref 0–32)
AST: 22 IU/L (ref 0–40)
Albumin: 4.2 g/dL (ref 3.7–4.7)
Alkaline Phosphatase: 71 IU/L (ref 39–117)
Bilirubin Total: 0.2 mg/dL (ref 0.0–1.2)
Bilirubin, Direct: 0.09 mg/dL (ref 0.00–0.40)
Total Protein: 6.6 g/dL (ref 6.0–8.5)

## 2019-08-23 ENCOUNTER — Encounter: Payer: Self-pay | Admitting: Cardiovascular Disease

## 2019-08-23 ENCOUNTER — Ambulatory Visit: Payer: PPO | Admitting: Cardiovascular Disease

## 2019-08-23 ENCOUNTER — Other Ambulatory Visit: Payer: Self-pay

## 2019-08-23 VITALS — BP 140/70 | HR 73 | Ht 67.0 in | Wt 202.0 lb

## 2019-08-23 DIAGNOSIS — I480 Paroxysmal atrial fibrillation: Secondary | ICD-10-CM

## 2019-08-23 DIAGNOSIS — E118 Type 2 diabetes mellitus with unspecified complications: Secondary | ICD-10-CM | POA: Diagnosis not present

## 2019-08-23 DIAGNOSIS — E782 Mixed hyperlipidemia: Secondary | ICD-10-CM | POA: Diagnosis not present

## 2019-08-23 DIAGNOSIS — I10 Essential (primary) hypertension: Secondary | ICD-10-CM | POA: Diagnosis not present

## 2019-08-23 NOTE — Patient Instructions (Signed)
Medication Instructions:  Your physician recommends that you continue on your current medications as directed. Please refer to the Current Medication list given to you today.  *If you need a refill on your cardiac medications before your next appointment, please call your pharmacy*  Lab Work: None Ordered   Testing/Procedures: None Ordered   Follow-Up: At [facility], you and your health needs are our priority.  As part of our continuing mission to provide you with exceptional heart care, we have created designated Provider Care Teams.  These Care Teams include your primary Cardiologist (physician) and Advanced Practice Providers (APPs -  Physician Assistants and Nurse Practitioners) who all work together to provide you with the care you need, when you need it.  Your next appointment:   1 year(s)  The format for your next appointment:   In Person  Provider:   Scott Weaver, PA-C, Vin Bhagat, PA-C or Janine Hammond, NP     
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
## 2019-08-30 ENCOUNTER — Other Ambulatory Visit: Payer: Self-pay | Admitting: Physician Assistant

## 2019-09-03 ENCOUNTER — Other Ambulatory Visit: Payer: Self-pay | Admitting: Oncology

## 2019-09-05 ENCOUNTER — Other Ambulatory Visit: Payer: Self-pay | Admitting: *Deleted

## 2019-10-13 DIAGNOSIS — Z03818 Encounter for observation for suspected exposure to other biological agents ruled out: Secondary | ICD-10-CM | POA: Diagnosis not present

## 2019-10-19 DIAGNOSIS — E538 Deficiency of other specified B group vitamins: Secondary | ICD-10-CM | POA: Diagnosis not present

## 2019-10-19 DIAGNOSIS — E1129 Type 2 diabetes mellitus with other diabetic kidney complication: Secondary | ICD-10-CM | POA: Diagnosis not present

## 2019-10-19 DIAGNOSIS — E7849 Other hyperlipidemia: Secondary | ICD-10-CM | POA: Diagnosis not present

## 2019-10-19 DIAGNOSIS — Z0000 Encounter for general adult medical examination without abnormal findings: Secondary | ICD-10-CM | POA: Diagnosis not present

## 2019-10-24 DIAGNOSIS — R82998 Other abnormal findings in urine: Secondary | ICD-10-CM | POA: Diagnosis not present

## 2019-10-26 DIAGNOSIS — E669 Obesity, unspecified: Secondary | ICD-10-CM | POA: Diagnosis not present

## 2019-10-26 DIAGNOSIS — I209 Angina pectoris, unspecified: Secondary | ICD-10-CM | POA: Diagnosis not present

## 2019-10-26 DIAGNOSIS — Z0000 Encounter for general adult medical examination without abnormal findings: Secondary | ICD-10-CM | POA: Diagnosis not present

## 2019-10-26 DIAGNOSIS — E785 Hyperlipidemia, unspecified: Secondary | ICD-10-CM | POA: Diagnosis not present

## 2019-10-26 DIAGNOSIS — Z1339 Encounter for screening examination for other mental health and behavioral disorders: Secondary | ICD-10-CM | POA: Diagnosis not present

## 2019-10-26 DIAGNOSIS — M8580 Other specified disorders of bone density and structure, unspecified site: Secondary | ICD-10-CM | POA: Diagnosis not present

## 2019-10-26 DIAGNOSIS — N1831 Chronic kidney disease, stage 3a: Secondary | ICD-10-CM | POA: Diagnosis not present

## 2019-10-26 DIAGNOSIS — D0501 Lobular carcinoma in situ of right breast: Secondary | ICD-10-CM | POA: Diagnosis not present

## 2019-10-26 DIAGNOSIS — E1129 Type 2 diabetes mellitus with other diabetic kidney complication: Secondary | ICD-10-CM | POA: Diagnosis not present

## 2019-10-26 DIAGNOSIS — E781 Pure hyperglyceridemia: Secondary | ICD-10-CM | POA: Diagnosis not present

## 2019-10-26 DIAGNOSIS — I129 Hypertensive chronic kidney disease with stage 1 through stage 4 chronic kidney disease, or unspecified chronic kidney disease: Secondary | ICD-10-CM | POA: Diagnosis not present

## 2019-10-26 DIAGNOSIS — Z1331 Encounter for screening for depression: Secondary | ICD-10-CM | POA: Diagnosis not present

## 2019-10-31 ENCOUNTER — Telehealth: Payer: Self-pay | Admitting: Neurology

## 2019-10-31 DIAGNOSIS — Z1212 Encounter for screening for malignant neoplasm of rectum: Secondary | ICD-10-CM | POA: Diagnosis not present

## 2019-10-31 NOTE — Telephone Encounter (Signed)
Shakima Lasch Key: [id number] - PA Case ID: [id number] Need help? Call us at [phone] Status Sent to [employer] Drug Gabapentin 300MG  capsules Form [employer] Electronic PA Form

## 2019-10-31 NOTE — Telephone Encounter (Signed)
Melanie [name] Key: [id number] help? Call us at [phone] Outcome Additional Information Required The patient currently has access to the requested medication and a Prior Authorization is not needed for the patient/medication. Drug Nortriptyline HCl 10MG  capsules Form [employer] Electronic PA Form Information regarding your request The patient currently has access to the requested medication and a Prior Authorization is not needed for the patient/medication.

## 2019-11-01 NOTE — Telephone Encounter (Signed)
Melanie [name] Key: [id number] - PA Case ID: [id number] Need help? 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

## 2019-11-02 NOTE — Telephone Encounter (Signed)
Sent fax to [facility]- Per them patient is trying to lower cost for 7 different medications- this being one- filled out the fax provided for this from them and faxed back to [phone]. Successful fax confirmation.

## 2019-11-04 NOTE — Telephone Encounter (Signed)
Approval from [facility]- for tier change request. Approval authorized from 09/16/19 to 09/14/20.

## 2019-11-07 ENCOUNTER — Telehealth: Payer: Self-pay

## 2019-11-07 MED ORDER — AMLODIPINE BESYLATE 2.5 MG PO TABS: 2.5000 mg | ORAL_TABLET | Freq: Every day | ORAL | 3 refills | Status: DC

## 2019-11-07 NOTE — Telephone Encounter (Signed)
**Note De-Identified Melanie [name] Obfuscation** Form received from [facility] stating that the pt is requesting a tier exception for her Isosorbide Mononitrate. The form states that there are lower tier alternatives that the pt should try and fail prior to requesting a tier ex for Isosorbide. They recommend switching her to Amlodipine or Verapamil.  I called the pt to discuss and she states that she cannot afford Isosorbide any longer. She states that she called [facility] and was advised that Isosorbide is a tier 3 and recommended that she request a tier exception which she did. She states that she has no prtoblem changing to a less expensive medication and is aware that I am forwarding this message to Dr Acie Fredrickson and his nurse for recommendation to the pt.

## 2019-11-07 NOTE — Telephone Encounter (Signed)
Spoke with patient who has changed pharmacies and isosorbide is a tier 3 medications. States she takes the isosorbide daily as prescribed but does not realize a particular benefit from taking it. Recent BP measurements range from 154/70 to 130/66 mmHg with pulse readings of 74-78 bpm. She took isosorbide 30 mg today in addition to her lisinopril 20 mg and Ziac 5-6.25 mg. I advised that Dr. Acie Fredrickson would like her to start amlodipine 2.5 mg daily and have her d/c isosorbide. I advised her to continue to monitor BP and pulse and that her SBP goal is 120-130 mmHg. I advised her to call back if she has variations in BP or other concerns such as dizziness, fatigue, or lightheadedness prior to her next appointment. She verbalized understanding and agreement with plan and thanked me for the call.

## 2019-11-07 NOTE — Telephone Encounter (Signed)
Left message for patient to call back regarding her medications Per Dr. Acie Fredrickson, would like to know if she has any BP readings from home. If patient is normotensive she may d/c isosorbide and monitor BP. If BP readings are elevated, we will prescribe low dose amlodipine.

## 2019-11-07 NOTE — Telephone Encounter (Signed)
Patient called back returning a call from [place].

## 2019-11-10 ENCOUNTER — Telehealth: Payer: Self-pay | Admitting: Neurology

## 2019-11-10 ENCOUNTER — Other Ambulatory Visit: Payer: Self-pay

## 2019-11-10 MED ORDER — GABAPENTIN 300 MG PO CAPS: 900.0000 mg | ORAL_CAPSULE | Freq: Three times a day (TID) | ORAL | 3 refills | Status: DC

## 2019-11-10 MED ORDER — NORTRIPTYLINE HCL 10 MG PO CAPS: ORAL_CAPSULE | ORAL | 3 refills | Status: DC

## 2019-11-10 NOTE — Telephone Encounter (Signed)
Sent!

## 2019-11-10 NOTE — Telephone Encounter (Signed)
Patient has new insurance and needs new prescriptions sent in for 3 month increments:  1. Gabapentin 300 MG   2. Nortriptyline 10 MG  [employer] Fax: [phone]

## 2019-11-11 ENCOUNTER — Telehealth: Payer: Self-pay

## 2019-11-11 NOTE — Telephone Encounter (Signed)
I called Melanie [name] to get the pts ins info from card they are running this refill under. I was advised that the pts Bisoprolol/HCTZ does not require a PA as [employer] paid for it. The pt picked up her refill on 10/28/2019 and was charged $0 for a 90 day supply per pharmacy.

## 2019-11-15 ENCOUNTER — Telehealth: Payer: Self-pay

## 2019-11-15 MED ORDER — HYDROCHLOROTHIAZIDE 12.5 MG PO CAPS: 12.5000 mg | ORAL_CAPSULE | Freq: Every day | ORAL | 3 refills | Status: DC

## 2019-11-15 MED ORDER — BISOPROLOL FUMARATE 5 MG PO TABS: 5.0000 mg | ORAL_TABLET | Freq: Every day | ORAL | 3 refills | Status: DC

## 2019-11-15 NOTE — Telephone Encounter (Signed)
Bisoprolol 5 mg and HCTZ 12.5 mg ordered separately due to insurance has requested a cheaper alternative to Ziac (bisoprolol-hydrochlorothiazide 5-6.25 mg)

## 2019-11-15 NOTE — Telephone Encounter (Signed)
**Note De-Identified Schyler Counsell Obfuscation** Form received from [facility] stating that there are are other less costing medications on the pts plan of covered meds to replace non formulary Bisoprolol/HCTZ. I have scanned the long list of covered meds and emailed them to Dr Acie Fredrickson and his nurse for advisement.

## 2019-11-21 ENCOUNTER — Telehealth: Payer: Self-pay | Admitting: Neurology

## 2019-11-21 NOTE — Telephone Encounter (Signed)
Spoke with patient about paperwork that continues to be faxed over from [facility] for [place] regarding her Nortriptyline and gabapentin medication. She said she just received a 90 day supply of gabapentin from pharm. She is in the process of switching to the [facility] pharm so she can get a cheaper copay on meds. Awaiting Tier change for this. Nortriptyline has already been approved for the Tier change- Her pharm is working on the Gabapentin. Nothing further we need to do at this point.

## 2019-11-22 DIAGNOSIS — E538 Deficiency of other specified B group vitamins: Secondary | ICD-10-CM | POA: Diagnosis not present

## 2019-11-30 DIAGNOSIS — I129 Hypertensive chronic kidney disease with stage 1 through stage 4 chronic kidney disease, or unspecified chronic kidney disease: Secondary | ICD-10-CM | POA: Diagnosis not present

## 2019-11-30 DIAGNOSIS — E1129 Type 2 diabetes mellitus with other diabetic kidney complication: Secondary | ICD-10-CM | POA: Diagnosis not present

## 2019-11-30 DIAGNOSIS — Z794 Long term (current) use of insulin: Secondary | ICD-10-CM | POA: Diagnosis not present

## 2019-11-30 DIAGNOSIS — N1831 Chronic kidney disease, stage 3a: Secondary | ICD-10-CM | POA: Diagnosis not present

## 2019-12-01 ENCOUNTER — Telehealth: Payer: Self-pay | Admitting: Pharmacist

## 2019-12-01 DIAGNOSIS — T466X5A Adverse effect of antihyperlipidemic and antiarteriosclerotic drugs, initial encounter: Secondary | ICD-10-CM

## 2019-12-01 DIAGNOSIS — G720 Drug-induced myopathy: Secondary | ICD-10-CM

## 2019-12-01 MED ORDER — PRALUENT 75 MG/ML ~~LOC~~ SOAJ: 1.0000 "pen " | SUBCUTANEOUS | 3 refills | Status: DC

## 2019-12-01 NOTE — Telephone Encounter (Signed)
Patient has new insurance. Prior authorization for Praluent approved through 09/14/20. Patient made aware and new Rx sent to pharmacy for 90 day supply per patient request

## 2019-12-13 DIAGNOSIS — H52223 Regular astigmatism, bilateral: Secondary | ICD-10-CM | POA: Diagnosis not present

## 2019-12-13 DIAGNOSIS — H524 Presbyopia: Secondary | ICD-10-CM | POA: Diagnosis not present

## 2019-12-14 DIAGNOSIS — I10 Essential (primary) hypertension: Secondary | ICD-10-CM | POA: Diagnosis not present

## 2019-12-14 DIAGNOSIS — R69 Illness, unspecified: Secondary | ICD-10-CM | POA: Diagnosis not present

## 2019-12-14 DIAGNOSIS — Z794 Long term (current) use of insulin: Secondary | ICD-10-CM | POA: Diagnosis not present

## 2019-12-14 DIAGNOSIS — E1142 Type 2 diabetes mellitus with diabetic polyneuropathy: Secondary | ICD-10-CM | POA: Diagnosis not present

## 2019-12-14 DIAGNOSIS — E669 Obesity, unspecified: Secondary | ICD-10-CM | POA: Diagnosis not present

## 2019-12-14 DIAGNOSIS — I739 Peripheral vascular disease, unspecified: Secondary | ICD-10-CM | POA: Diagnosis not present

## 2019-12-14 DIAGNOSIS — E785 Hyperlipidemia, unspecified: Secondary | ICD-10-CM | POA: Diagnosis not present

## 2019-12-14 DIAGNOSIS — M1990 Unspecified osteoarthritis, unspecified site: Secondary | ICD-10-CM | POA: Diagnosis not present

## 2019-12-14 DIAGNOSIS — I951 Orthostatic hypotension: Secondary | ICD-10-CM | POA: Diagnosis not present

## 2019-12-14 DIAGNOSIS — E1165 Type 2 diabetes mellitus with hyperglycemia: Secondary | ICD-10-CM | POA: Diagnosis not present

## 2019-12-26 ENCOUNTER — Encounter: Payer: Self-pay | Admitting: Physician Assistant

## 2019-12-26 ENCOUNTER — Ambulatory Visit: Payer: Medicare HMO | Admitting: Physician Assistant

## 2019-12-26 ENCOUNTER — Other Ambulatory Visit: Payer: Self-pay

## 2019-12-26 ENCOUNTER — Ambulatory Visit (INDEPENDENT_AMBULATORY_CARE_PROVIDER_SITE_OTHER): Payer: Medicare HMO

## 2019-12-26 DIAGNOSIS — M25551 Pain in right hip: Secondary | ICD-10-CM | POA: Diagnosis not present

## 2019-12-26 DIAGNOSIS — M5441 Lumbago with sciatica, right side: Secondary | ICD-10-CM

## 2019-12-26 NOTE — Progress Notes (Signed)
Office Visit Note   Patient: Melanie [name]           Date of Birth: 1946-01-29           MRN: [id number] Visit Date: 12/26/2019              Requested by: Marton Redwood, MD [address] [place],  [place] [id number] PCP: Marton Redwood, MD   Assessment & Plan: Visit Diagnoses:  1. Pain in right hip   2. Right-sided low back pain with right-sided sciatica, unspecified chronicity     Plan:  Discussed with her cortisone injection she would rather stay away from cortisone due to her diabetes.  Therefore we will send her to formal physical therapy for IT band stretching, home exercise program and modalities.  She will follow-up with [place] about 4 weeks to see how she is doing.  Questions encouraged and answered.  Follow-Up Instructions: Return in about 4 weeks (around 01/23/2020).   Orders:  Orders Placed This Encounter  Procedures  . XR Lumbar Spine 2-3 Views   No orders of the defined types were placed in this encounter.     Procedures: No procedures performed   Clinical Data: No additional findings.   Subjective: Chief Complaint  Patient presents with  . Lower Back - Pain  . Right Hip - Pain    HPI Melanie [name] is a 74 year old female comes in today with right hip pain.  She has had no fall no injury.  No groin pain.  Denies any numbness tingling down the leg.  However she does have radiating pain down the lateral aspect of the right leg.  She states she can push on the spine and the spine is painful she states she is weak cannot stand and also history of neuropathy too long.  She does a history of back surgery with fusion at L3- 4. also history of neuropathy.  Denies any groin pain.  She has 1 spot that she can push on the lateral aspect of her hip is tender.  States she cannot stand for long period of time.  She is diabetic and is just now getting sugars under control after coming out of a diabetes study.  Review of Systems See HPI.  Objective: Vital Signs:  There were no vitals taken for this visit.  Physical Exam Constitutional:      Appearance: She is not ill-appearing or diaphoretic.  Pulmonary:     Effort: Pulmonary effort is normal.  Neurological:     Mental Status: She is alert and oriented to person, place, and time.  Psychiatric:        Mood and Affect: Mood normal.     Ortho Exam Lower extremities 5 5 strength throughout.  Negative straight leg raise bilaterally.  Good range of motion of both hips without pain.  Tenderness over the right hip trochanteric region with slight tenderness down the IT band. Specialty Comments:  No specialty comments available.  Imaging: XR Lumbar Spine 2-3 Views  Result Date: 12/26/2019 Lumbar spine: No acute fracture.  Status post L3-L4 with no hardware failure or loosening.  No spondylolisthesis.    PMFS History: Patient Active Problem List   Diagnosis Date Noted  . Syncope and collapse 12/30/2018  . Genetic testing 02/18/2018  . Angina pectoris ([place]) 02/09/2018  . Hyperlipidemia 02/04/2018  . Family history of breast cancer   . Family history of ovarian cancer   . Atypical ductal hyperplasia of right breast 11/10/2017  . Lobular carcinoma  in situ (LCIS) of right breast 11/10/2017  . CKD (chronic kidney disease), stage III 11/10/2017  . 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 HNP (herniated nucleus pulposus), lumbar   . Cholelithiasis with chronic cholecystitis 12/28/2014  . Cholelithiasis with cholecystitis 12/26/2014  . Vitamin D deficiency 09/27/2014  . Diabetic neuropathy ([place]) 10/06/2013  . B12 deficiency 09/27/2013  . Spinal stenosis of lumbar region 07/20/2013  . HTN (hypertension)   . Bradycardia   . DM (diabetes mellitus) ([place])   . PAF (paroxysmal atrial fibrillation) (HCC)    Past Medical History:  Diagnosis Date  . Arthritis   . Bradycardia   . Depression    under control  . DM (diabetes mellitus) ([place])    type 2   . Dyspnea    with some exertion  . Family history of anesthesia complication    mother-nausea and vomiting  . Family history of breast cancer   . Family history of ovarian cancer   . GERD (gastroesophageal reflux disease)    occasional  . Headache   . HTN (hypertension)   . Neuropathy    legs and feet  . Neuropathy    feet  . PAF (paroxysmal atrial fibrillation) (HCC) 10 years ago   no problems since, due to stress  . Situational stress    "was taking care of sick husband"    Family History  Problem Relation Age of Onset  . Heart attack Father        Deceased, 51  . Other Mother        Deceased, 95  . Neuropathy Mother   . Hypertension Mother   . Breast cancer Sister        58's  . Ovarian cancer Sister        dx and died in 27's  . Multiple sclerosis Sister   . Neuropathy Sister   . Stroke Sister   . Hypertension Sister   . Cancer Maternal Uncle        dx >50, type unk  . Stomach cancer Paternal Uncle        dx >50  . Cancer Maternal Uncle        dx >50, type unk  . Cancer Cousin 60       type of cancer unk    Past Surgical History:  Procedure Laterality Date  . ABDOMINAL HYSTERECTOMY  1980   partial  . APPENDECTOMY  1980  . BACK SURGERY  2004  . BREAST EXCISIONAL BIOPSY Right 2019   LCIS; ADH  . BREAST LUMPECTOMY WITH RADIOACTIVE SEED LOCALIZATION Right 10/27/2017   Procedure: RIGHT BREAST PARTIAL MASTECTIOMY WITH RADIOACTIVE SEED LOCALIZATION ERAS PATHWAY;  Surgeon: Coralie Keens, MD;  Location: [facility];  Service: General;  Laterality: Right;  . CARDIAC CATHETERIZATION     in the late 90's/early 2000's  . CARPAL TUNNEL RELEASE Bilateral 2003, 2004  . CHOLECYSTECTOMY N/A 12/28/2014   Procedure: LAPAROSCOPIC CHOLECYSTECTOMY WITH INTRAOPERATIVE CHOLANGIOGRAM;  Surgeon: Armandina Gemma, MD;  Location: [facility];  Service: General;  Laterality: N/A;  . COLONOSCOPY  8 years ago  . JOINT REPLACEMENT Bilateral 2007  . KNEE ARTHROSCOPY Bilateral 1990's  .  LAMINECTOMY  11/28/2016   L 3   . LUMBAR LAMINECTOMY N/A 07/20/2013   Procedure: MICRODISCECTOMY LUMBAR LAMINECTOMY;  Surgeon: [name], MD;  Location: [facility];  Service: Orthopedics;  Laterality: N/A;  L2-3 Decompression  . LUMBAR LAMINECTOMY/DECOMPRESSION MICRODISCECTOMY N/A 11/28/2016   Procedure:  L3 LAMINECTOMY, PARTIAL LAMINECTOMY WITH REMOVAL OF FREE FRAGMENT LEFT L3-4;  Surgeon: [name], MD;  Location: [facility];  Service: Orthopedics;  Laterality: N/A;  . LUMBAR LAMINECTOMY/DECOMPRESSION MICRODISCECTOMY N/A 02/18/2017   Procedure: L3-4 MICRODISCECTOMY;  Surgeon: [name], MD;  Location: [facility];  Service: Orthopedics;  Laterality: N/A;   Social History   Occupational History  . Occupation: retired   Tobacco Use  . Smoking status: Current Every Day Smoker    Packs/day: 0.50    Years: 30.00    Pack years: 15.00    Types: Cigarettes  . Smokeless tobacco: Never Used  Substance and Sexual Activity  . Alcohol use: No    Alcohol/week: 0.0 standard drinks  . Drug use: No  . Sexual activity: Not Currently

## 2019-12-30 DIAGNOSIS — M6281 Muscle weakness (generalized): Secondary | ICD-10-CM | POA: Diagnosis not present

## 2019-12-30 DIAGNOSIS — M25551 Pain in right hip: Secondary | ICD-10-CM | POA: Diagnosis not present

## 2020-01-05 DIAGNOSIS — J209 Acute bronchitis, unspecified: Secondary | ICD-10-CM | POA: Diagnosis not present

## 2020-01-05 DIAGNOSIS — Z20828 Contact with and (suspected) exposure to other viral communicable diseases: Secondary | ICD-10-CM | POA: Diagnosis not present

## 2020-01-05 DIAGNOSIS — J0100 Acute maxillary sinusitis, unspecified: Secondary | ICD-10-CM | POA: Diagnosis not present

## 2020-01-05 DIAGNOSIS — R519 Headache, unspecified: Secondary | ICD-10-CM | POA: Diagnosis not present

## 2020-01-11 DIAGNOSIS — M25551 Pain in right hip: Secondary | ICD-10-CM | POA: Diagnosis not present

## 2020-01-11 DIAGNOSIS — M6281 Muscle weakness (generalized): Secondary | ICD-10-CM | POA: Diagnosis not present

## 2020-01-13 DIAGNOSIS — M6281 Muscle weakness (generalized): Secondary | ICD-10-CM | POA: Diagnosis not present

## 2020-01-13 DIAGNOSIS — M25551 Pain in right hip: Secondary | ICD-10-CM | POA: Diagnosis not present

## 2020-01-17 DIAGNOSIS — M6281 Muscle weakness (generalized): Secondary | ICD-10-CM | POA: Diagnosis not present

## 2020-01-17 DIAGNOSIS — M25551 Pain in right hip: Secondary | ICD-10-CM | POA: Diagnosis not present

## 2020-01-23 ENCOUNTER — Telehealth: Payer: Self-pay | Admitting: Physician Assistant

## 2020-01-23 NOTE — Telephone Encounter (Signed)
Patient states the PT is making her pain so much worse, pain down the back of her leg now since PT.  States when it gets bad it feels like it's in a knot, like a bad cramp ?MRI

## 2020-01-23 NOTE — Telephone Encounter (Signed)
Patient called asking for a call back. Patient states to have concerns about physical therapy. Patient phone number is [phone].

## 2020-01-23 NOTE — Telephone Encounter (Signed)
Yes mri r/o HNP

## 2020-01-24 ENCOUNTER — Other Ambulatory Visit: Payer: Self-pay

## 2020-01-24 DIAGNOSIS — M6281 Muscle weakness (generalized): Secondary | ICD-10-CM | POA: Diagnosis not present

## 2020-01-24 DIAGNOSIS — M4807 Spinal stenosis, lumbosacral region: Secondary | ICD-10-CM

## 2020-01-24 DIAGNOSIS — M25551 Pain in right hip: Secondary | ICD-10-CM | POA: Diagnosis not present

## 2020-01-24 MED ORDER — LISINOPRIL 20 MG PO TABS: 20.0000 mg | ORAL_TABLET | Freq: Every day | ORAL | 1 refills | Status: DC

## 2020-01-24 NOTE — Telephone Encounter (Signed)
Patient aware that we have sent for her to have an MRI and we will call her to set up

## 2020-01-25 ENCOUNTER — Telehealth: Payer: Self-pay | Admitting: Orthopaedic Surgery

## 2020-01-25 ENCOUNTER — Other Ambulatory Visit: Payer: Self-pay | Admitting: Orthopaedic Surgery

## 2020-01-25 MED ORDER — ACETAMINOPHEN-CODEINE #3 300-30 MG PO TABS: 1.0000 | ORAL_TABLET | Freq: Three times a day (TID) | ORAL | 0 refills | Status: DC | PRN

## 2020-01-25 NOTE — Telephone Encounter (Signed)
Patient would like something for pain

## 2020-01-25 NOTE — Telephone Encounter (Signed)
Patient called requesting pain medication. Patient states she has severe pains in right hip and back of right hip. Physical therapy she states might be making it worse. Please send prescription to [facility]. Please call patient when script has been sent in.  Patient phone number is [phone].

## 2020-01-25 NOTE — Telephone Encounter (Signed)
I know [name] saw the patient last.  However she is a patient of Dr. Lorin [name] who is done at least 2 lumbar spine surgeries on her.  I sent in some Tylenol 3.  She needs a follow-up appoint with Dr. Lorin [name] for her spine.

## 2020-01-26 DIAGNOSIS — M6281 Muscle weakness (generalized): Secondary | ICD-10-CM | POA: Diagnosis not present

## 2020-01-26 DIAGNOSIS — M25551 Pain in right hip: Secondary | ICD-10-CM | POA: Diagnosis not present

## 2020-01-26 NOTE — Telephone Encounter (Signed)
Patient aware of below message and we scheduled her an appt with Dr. Lorin Mercy

## 2020-01-30 DIAGNOSIS — M25551 Pain in right hip: Secondary | ICD-10-CM | POA: Diagnosis not present

## 2020-01-30 DIAGNOSIS — M6281 Muscle weakness (generalized): Secondary | ICD-10-CM | POA: Diagnosis not present

## 2020-02-01 DIAGNOSIS — M6281 Muscle weakness (generalized): Secondary | ICD-10-CM | POA: Diagnosis not present

## 2020-02-01 DIAGNOSIS — M25551 Pain in right hip: Secondary | ICD-10-CM | POA: Diagnosis not present

## 2020-02-06 DIAGNOSIS — M6281 Muscle weakness (generalized): Secondary | ICD-10-CM | POA: Diagnosis not present

## 2020-02-06 DIAGNOSIS — M25551 Pain in right hip: Secondary | ICD-10-CM | POA: Diagnosis not present

## 2020-02-07 ENCOUNTER — Other Ambulatory Visit: Payer: Self-pay

## 2020-02-07 ENCOUNTER — Ambulatory Visit: Payer: Medicare HMO | Admitting: Orthopaedic Surgery

## 2020-02-07 ENCOUNTER — Telehealth: Payer: Self-pay | Admitting: Physician Assistant

## 2020-02-07 MED ORDER — LISINOPRIL 20 MG PO TABS: 20.0000 mg | ORAL_TABLET | Freq: Every day | ORAL | 1 refills | Status: DC

## 2020-02-07 NOTE — Telephone Encounter (Signed)
I left her a message on the phone.  I would see if she can be worked in some time to Dr. Narda Amber schedule since he has seen her in the past and perform back surgery on her I believe twice.  We felt that this was maybe hip bursitis but she says the therapies made things worse so I left her mention to stop therapy and try Voltaren gel and let her know that we would see if Dr. Lorin Mercy can see her sometime this week or next week.

## 2020-02-07 NOTE — Telephone Encounter (Signed)
See below

## 2020-02-07 NOTE — Telephone Encounter (Signed)
I called patient. Appointment made for tomorrow morning at 10:45.

## 2020-02-07 NOTE — Telephone Encounter (Signed)
Patient called requesting a call back from [facility], Dr. Ninfa Linden, or nurse. Patient states she has physical therapy and it is making her condition worse. Patient has questions and concerns about therapy. Patient phone number is [phone].

## 2020-02-07 NOTE — Telephone Encounter (Signed)
Can you help me out with this?

## 2020-02-08 ENCOUNTER — Encounter: Payer: Self-pay | Admitting: Orthopaedic Surgery

## 2020-02-08 ENCOUNTER — Ambulatory Visit: Payer: Medicare HMO | Admitting: Orthopaedic Surgery

## 2020-02-08 ENCOUNTER — Other Ambulatory Visit: Payer: Self-pay

## 2020-02-08 VITALS — Ht 67.0 in | Wt 200.0 lb

## 2020-02-08 DIAGNOSIS — G720 Drug-induced myopathy: Secondary | ICD-10-CM | POA: Insufficient documentation

## 2020-02-08 DIAGNOSIS — Z981 Arthrodesis status: Secondary | ICD-10-CM

## 2020-02-08 DIAGNOSIS — M48061 Spinal stenosis, lumbar region without neurogenic claudication: Secondary | ICD-10-CM | POA: Diagnosis not present

## 2020-02-08 DIAGNOSIS — T466X5A Adverse effect of antihyperlipidemic and antiarteriosclerotic drugs, initial encounter: Secondary | ICD-10-CM | POA: Insufficient documentation

## 2020-02-08 MED ORDER — HYDROCODONE-ACETAMINOPHEN 5-325 MG PO TABS: 1.0000 | ORAL_TABLET | Freq: Four times a day (QID) | ORAL | 0 refills | Status: DC | PRN

## 2020-02-08 NOTE — Progress Notes (Deleted)
Office Visit Note   Patient: Melanie [name]           Date of Birth: 1945-10-14           MRN: [id number] Visit Date: 02/08/2020              Requested by: Marton Redwood, MD [address] [place],   [id number] PCP: Marton Redwood, MD   Assessment & Plan: Visit Diagnoses: No diagnosis found.  Plan: ***  Follow-Up Instructions: No follow-ups on file.   Orders:  No orders of the defined types were placed in this encounter.  No orders of the defined types were placed in this encounter.     Procedures: No procedures performed   Clinical Data: No additional findings.   Subjective: Chief Complaint  Patient presents with  . Lower Back - Pain    HPI  Review of Systems   Objective: Vital Signs: Ht 5\' 7"  (1.702 m)   Wt 200 lb (90.7 kg)   BMI 31.32 kg/m   Physical Exam  Ortho Exam  Specialty Comments:  No specialty comments available.  Imaging: No results found.   PMFS History: Patient Active Problem List   Diagnosis Date Noted  . Statin myopathy 02/08/2020  . Syncope and collapse 12/30/2018  . Genetic testing 02/18/2018  . Angina pectoris ([place]) 02/09/2018  . Hyperlipidemia 02/04/2018  . Family history of breast cancer   . Family history of ovarian cancer   . Atypical ductal hyperplasia of right breast 11/10/2017  . Lobular carcinoma in situ (LCIS) of right breast 11/10/2017  . CKD (chronic kidney disease), stage III 11/10/2017  . 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 HNP (herniated nucleus pulposus), lumbar   . Cholelithiasis with chronic cholecystitis 12/28/2014  . Cholelithiasis with cholecystitis 12/26/2014  . Vitamin D deficiency 09/27/2014  . Diabetic neuropathy ([place]) 10/06/2013  . B12 deficiency 09/27/2013  . Spinal stenosis of lumbar region 07/20/2013  . HTN (hypertension)   . Bradycardia   . DM (diabetes mellitus) ([place])   . PAF (paroxysmal atrial fibrillation) (H[facility])     Past Medical History:  Diagnosis Date  . Arthritis   . Bradycardia   . Depression    under control  . DM (diabetes mellitus) ([place])    type 2  . Dyspnea    with some exertion  . Family history of anesthesia complication    mother-nausea and vomiting  . Family history of breast cancer   . Family history of ovarian cancer   . GERD (gastroesophageal reflux disease)    occasional  . Headache   . HTN (hypertension)   . Neuropathy    legs and feet  . Neuropathy    feet  . PAF (paroxysmal atrial fibrillation) (HCC) 10 years ago   no problems since, due to stress  . Situational stress    "was taking care of sick husband"    Family History  Problem Relation Age of Onset  . Heart attack Father        Deceased, 56  . Other Mother        Deceased, 48  . Neuropathy Mother   . Hypertension Mother   . Breast cancer Sister        40's  . Ovarian cancer Sister        dx and died in 67's  . Multiple sclerosis Sister   . Neuropathy Sister   . Stroke Sister   . Hypertension  Sister   . Cancer Maternal Uncle        dx >50, type unk  . Stomach cancer Paternal Uncle        dx >50  . Cancer Maternal Uncle        dx >50, type unk  . Cancer Cousin 60       type of cancer unk    Past Surgical History:  Procedure Laterality Date  . ABDOMINAL HYSTERECTOMY  1980   partial  . APPENDECTOMY  1980  . BACK SURGERY  2004  . BREAST EXCISIONAL BIOPSY Right 2019   LCIS; [facility]  . BREAST LUMPECTOMY WITH RADIOACTIVE SEED LOCALIZATION Right 10/27/2017   Procedure: RIGHT BREAST PARTIAL MASTECTIOMY WITH RADIOACTIVE SEED LOCALIZATION ERAS PATHWAY;  Surgeon: Coralie Keens, MD;  Location: [place];  Service: General;  Laterality: Right;  . CARDIAC CATHETERIZATION     in the late 90's/early 2000's  . CARPAL TUNNEL RELEASE Bilateral 2003, 2004  . CHOLECYSTECTOMY N/A 12/28/2014   Procedure: LAPAROSCOPIC CHOLECYSTECTOMY WITH INTRAOPERATIVE CHOLANGIOGRAM;  Surgeon: Armandina Gemma, MD;  [facility];   Service: General;  Laterality: N/A;  . COLONOSCOPY  8 years ago  . JOINT REPLACEMENT Bilateral 2007  . KNEE ARTHROSCOPY Bilateral 1990's  . LAMINECTOMY  11/28/2016   L 3   . LUMBAR LAMINECTOMY N/A 07/20/2013   Procedure: MICRODISCECTOMY LUMBAR LAMINECTOMY;  Surgeon: [name], MD;  Location: [place];  Service: Orthopedics;  Laterality: N/A;  L2-3 Decompression  . LUMBAR LAMINECTOMY/DECOMPRESSION MICRODISCECTOMY N/A 11/28/2016   Procedure: L3 LAMINECTOMY, PARTIAL LAMINECTOMY WITH REMOVAL OF FREE FRAGMENT LEFT L3-4;  Surgeon: [name], MD;  Location: [place];  Service: Orthopedics;  Laterality: N/A;  . LUMBAR LAMINECTOMY/DECOMPRESSION MICRODISCECTOMY N/A 02/18/2017   Procedure: L3-4 MICRODISCECTOMY;  Surgeon: [name], MD;  Location: [place];  Service: Orthopedics;  Laterality: N/A;   Social History   Occupational History  . Occupation: retired   Tobacco Use  . Smoking status: Current Every Day Smoker    Packs/day: 0.50    Years: 30.00    Pack years: 15.00    Types: Cigarettes  . Smokeless tobacco: Never Used  Substance and Sexual Activity  . Alcohol use: No    Alcohol/week: 0.0 standard drinks  . Drug use: No  . Sexual activity: Not Currently

## 2020-02-10 ENCOUNTER — Other Ambulatory Visit: Payer: Self-pay | Admitting: *Deleted

## 2020-02-10 MED ORDER — ANASTROZOLE 1 MG PO TABS: 1.0000 mg | ORAL_TABLET | Freq: Every day | ORAL | 3 refills | Status: DC

## 2020-02-14 DIAGNOSIS — Z981 Arthrodesis status: Secondary | ICD-10-CM | POA: Insufficient documentation

## 2020-02-14 NOTE — Progress Notes (Signed)
Office Visit Note   Patient: Melanie [name]           Date of Birth: 10-04-1945           MRN: [id number] Visit Date: 02/08/2020              Requested by: Marton Redwood, MD [address] [place],  [place] [address] PCP: Marton Redwood, MD   Assessment & Plan: Visit Diagnoses:  1. Spinal stenosis of lumbar region, unspecified whether neurogenic claudication present   2. History of lumbar fusion     Plan: Norco prescribed 30 tablets 5/325.  Should use them sparingly.  She has MRI scan scheduled the beginning of June and she can return after scan for review.  She has some disc space narrowing at L2-3 above her fusion and may have some adjacent level stenosis.  X-rays demonstrate good position of hardware graft and cage from previous fusion at L3-4.  Follow-Up Instructions: Return after MRI lumbar.   Orders:  No orders of the defined types were placed in this encounter.  Meds ordered this encounter  Medications   HYDROcodone-acetaminophen (NORCO/VICODIN) 5-325 MG tablet    Sig: Take 1-2 tablets by mouth every 6 (six) hours as needed for moderate pain.    Dispense:  30 tablet    Refill:  0      Procedures: No procedures performed   Clinical Data: No additional findings.   Subjective: Chief Complaint  Patient presents with   Lower Back - Pain    HPI 74 year old female who been seeing Dr. Ninfa Linden concerning her hip had previous microdiscectomy at L3-4 by me 2018 and then had an L3-4 anterior and posterior decompression and fusion done by Dr. Izell [place] in 2018.  She is scheduled for lumbar MRI without contrast on 02/21/2020.  She has been using Tylenol 3 without relief.  Patient states she started having posterior hip pain on the right now the pain radiates from her back down into her knee.  She had one morning she woke up and her heel felt funny.  She has had problems with stairs.  Previous x-rays showed L3-4 pedicle instrumentation with cage present and lateral  plate and screws.  Patient had this space narrowing at L2-3 above her fusion without spondylolisthesis.  Review of Systems diabetes last A1c in February 2021 was 7.5.  Possible history of diabetic neuropathy stage III kidney disease gallbladder problems in the past history of angina hypertension hyperlipidemia right breast cancer.  Otherwise negative as pertains HPI.   Objective: Vital Signs: Ht 5\' 7"  (1.702 m)    Wt 200 lb (90.7 kg)    BMI 31.32 kg/m   Physical Exam Constitutional:      Appearance: She is well-developed.  HENT:     Head: Normocephalic.     Right Ear: External ear normal.     Left Ear: External ear normal.  Eyes:     Pupils: Pupils are equal, round, and reactive to light.  Neck:     Thyroid: No thyromegaly.     Trachea: No tracheal deviation.  Cardiovascular:     Rate and Rhythm: Normal rate.  Pulmonary:     Effort: Pulmonary effort is normal.  Abdominal:     Palpations: Abdomen is soft.  Skin:    General: Skin is warm and dry.  Neurological:     Mental Status: She is alert and oriented to person, place, and time.  Psychiatric:        Behavior: Behavior  normal.     Ortho Exam well-healed lumbar incision negative logroll to the hips.  Knee jerk is absent on the right 1+ on the left ankle jerk is 1+ and symmetrical.  Specialty Comments:  No specialty comments available.  Imaging: No results found.   PMFS History: Patient Active Problem List   Diagnosis Date Noted   History of lumbar fusion 02/14/2020   Statin myopathy 02/08/2020   Syncope and collapse 12/30/2018   Genetic testing 02/18/2018   Angina pectoris ([place]) 02/09/2018   Hyperlipidemia 02/04/2018   Family history of breast cancer    Family history of ovarian cancer    Atypical ductal hyperplasia of right breast 11/10/2017   Lobular carcinoma in situ (LCIS) of right breast 11/10/2017   CKD (chronic kidney disease), stage III 11/10/2017   Recurrent herniation of lumbar disc  01/20/2017   Post laminectomy syndrome 01/06/2017   HNP (herniated nucleus pulposus), lumbar    Cholelithiasis with chronic cholecystitis 12/28/2014   Cholelithiasis with cholecystitis 12/26/2014   Vitamin D deficiency 09/27/2014   Diabetic neuropathy ([place]) 10/06/2013   B12 deficiency 09/27/2013   Spinal stenosis of lumbar region 07/20/2013   HTN (hypertension)    Bradycardia    DM (diabetes mellitus) ([place])    PAF (paroxysmal atrial fibrillation) ([facility])    Past Medical History:  Diagnosis Date   Arthritis    Bradycardia    Depression    under control   DM (diabetes mellitus) ([place])    type 2   Dyspnea    with some exertion   Family history of anesthesia complication    mother-nausea and vomiting   Family history of breast cancer    Family history of ovarian cancer    GERD (gastroesophageal reflux disease)    occasional   Headache    HTN (hypertension)    Neuropathy    legs and feet   Neuropathy    feet   PAF (paroxysmal atrial fibrillation) (H[facility]) 10 years ago   no problems since, due to stress   Situational stress    "was taking care of sick husband"    Family History  Problem Relation Age of Onset   Heart attack Father        Deceased, 34   Other Mother        Deceased, 34   Neuropathy Mother    Hypertension Mother    Breast cancer Sister        54's   Ovarian cancer Sister        dx and died in 62's   Multiple sclerosis Sister    Neuropathy Sister    Stroke Sister    Hypertension Sister    Cancer Maternal Uncle        dx >50, type unk   Stomach cancer Paternal Uncle        dx >50   Cancer Maternal Uncle        dx >50, type unk   Cancer Cousin 60       type of cancer unk    Past Surgical History:  Procedure Laterality Date   ABDOMINAL HYSTERECTOMY  1980   partial   APPENDECTOMY  1980   BACK SURGERY  2004   BREAST EXCISIONAL BIOPSY Right 2019   LCIS; [facility]   BREAST LUMPECTOMY WITH RADIOACTIVE SEED  LOCALIZATION Right 10/27/2017   Procedure: RIGHT BREAST PARTIAL MASTECTIOMY WITH RADIOACTIVE SEED LOCALIZATION ERAS PATHWAY;  Surgeon: Coralie Keens, MD;  Location: [place];  Service: General;  Laterality: Right;   CARDIAC CATHETERIZATION     in the late 90's/early 2000's   CARPAL TUNNEL RELEASE Bilateral 2003, 2004   CHOLECYSTECTOMY N/A 12/28/2014   Procedure: LAPAROSCOPIC CHOLECYSTECTOMY WITH INTRAOPERATIVE CHOLANGIOGRAM;  Surgeon: Armandina Gemma, MD;  [facility];  Service: General;  Laterality: N/A;   COLONOSCOPY  8 years ago   JOINT REPLACEMENT Bilateral 2007   KNEE ARTHROSCOPY Bilateral 1990's   LAMINECTOMY  11/28/2016   L 3    LUMBAR LAMINECTOMY N/A 07/20/2013   Procedure: MICRODISCECTOMY LUMBAR LAMINECTOMY;  Surgeon: [name], MD;  Location: [place];  Service: Orthopedics;  Laterality: N/A;  L2-3 Decompression   LUMBAR LAMINECTOMY/DECOMPRESSION MICRODISCECTOMY N/A 11/28/2016   Procedure: L3 LAMINECTOMY, PARTIAL LAMINECTOMY WITH REMOVAL OF FREE FRAGMENT LEFT L3-4;  Surgeon: [name], MD;  Location: [place];  Service: Orthopedics;  Laterality: N/A;   LUMBAR LAMINECTOMY/DECOMPRESSION MICRODISCECTOMY N/A 02/18/2017   Procedure: L3-4 MICRODISCECTOMY;  Surgeon: [name], MD;  Location: [place];  Service: Orthopedics;  Laterality: N/A;   Social History   Occupational History   Occupation: retired   Tobacco Use   Smoking status: Current Every Day Smoker    Packs/day: 0.50    Years: 30.00    Pack years: 15.00    Types: Cigarettes   Smokeless tobacco: Never Used  Substance and Sexual Activity   Alcohol use: No    Alcohol/week: 0.0 standard drinks   Drug use: No   Sexual activity: Not Currently

## 2020-02-15 ENCOUNTER — Other Ambulatory Visit: Payer: Self-pay | Admitting: Orthopaedic Surgery

## 2020-02-15 ENCOUNTER — Telehealth: Payer: Self-pay | Admitting: Orthopaedic Surgery

## 2020-02-15 MED ORDER — HYDROCODONE-ACETAMINOPHEN 5-325 MG PO TABS: 1.0000 | ORAL_TABLET | Freq: Four times a day (QID) | ORAL | 0 refills | Status: DC | PRN

## 2020-02-15 NOTE — Telephone Encounter (Signed)
Please advise 

## 2020-02-15 NOTE — Telephone Encounter (Signed)
Patient called needing Rx refilled Hydrocodone. The number to contact patient is [phone]

## 2020-02-15 NOTE — Telephone Encounter (Signed)
I called her and refilled. Has scan 02/21/20

## 2020-02-21 ENCOUNTER — Other Ambulatory Visit: Payer: Self-pay

## 2020-02-21 ENCOUNTER — Ambulatory Visit
Admission: RE | Admit: 2020-02-21 | Discharge: 2020-02-21 | Disposition: A | Discharge status: Home / Self Care | Payer: Medicare HMO | Source: Ambulatory Visit | Attending: Physician Assistant | Admitting: Physician Assistant

## 2020-02-21 DIAGNOSIS — M48061 Spinal stenosis, lumbar region without neurogenic claudication: Secondary | ICD-10-CM | POA: Diagnosis not present

## 2020-02-21 DIAGNOSIS — M4807 Spinal stenosis, lumbosacral region: Secondary | ICD-10-CM

## 2020-02-23 ENCOUNTER — Telehealth: Payer: Self-pay | Admitting: Orthopaedic Surgery

## 2020-02-23 NOTE — Telephone Encounter (Signed)
Please see below and advise.

## 2020-02-23 NOTE — Telephone Encounter (Signed)
Pt called wanting to know if Gwinda Passe could give her the MRI results over the phone?  [phone]

## 2020-02-23 NOTE — Telephone Encounter (Signed)
Sure oK with me.

## 2020-02-24 NOTE — Telephone Encounter (Signed)
I called patient and advised. She had lumbar fusion with neurosurgeon in [facility] and would like to go back and see him to discuss treatment options. I advised that she should be able to call and schedule appt with him as she is an established patient. She would like to keep her appt here for next week until she speaks with his office. She will call to cancel if she decides not to come. Patient aware she will need to be sure that physician in [facility] can see her imaging studies or she will need to take CD with her. Patient expressed understanding.

## 2020-02-27 DIAGNOSIS — Z4789 Encounter for other orthopedic aftercare: Secondary | ICD-10-CM | POA: Diagnosis not present

## 2020-02-28 ENCOUNTER — Ambulatory Visit: Payer: Medicare HMO | Admitting: Orthopaedic Surgery

## 2020-02-28 ENCOUNTER — Telehealth: Payer: Self-pay | Admitting: Orthopaedic Surgery

## 2020-02-28 NOTE — Telephone Encounter (Signed)
Patient called. Would like B[place]y to call her.

## 2020-02-29 NOTE — Telephone Encounter (Signed)
I called patient. She states that she went to see spine specialist in [facility].  She did not take a copy of her images on disc, so she will go back and see him this Friday once she has the CD.  She wanted [place] to know how much she appreciates her care here and that she is going back to that doctor because he did the previous surgery.

## 2020-03-02 ENCOUNTER — Telehealth: Payer: Self-pay | Admitting: *Deleted

## 2020-03-02 DIAGNOSIS — M48062 Spinal stenosis, lumbar region with neurogenic claudication: Secondary | ICD-10-CM | POA: Diagnosis not present

## 2020-03-02 DIAGNOSIS — M5416 Radiculopathy, lumbar region: Secondary | ICD-10-CM | POA: Diagnosis not present

## 2020-03-02 NOTE — Telephone Encounter (Signed)
   [facility] Pre-operative Risk Assessment    HEARTCARE STAFF: - Please ensure there is not already an duplicate clearance open for this procedure. - Under Visit Info/Reason for Call, type in Other and utilize the format Clearance MM/DD/YY or Clearance TBD. Do not use dashes or single digits. - If request is for dental extraction, please clarify the # of teeth to be extracted.  Request for surgical clearance:  1. What type of surgery is being performed? LUMBAR SPINAL SURGERY  2. When is this surgery scheduled? 03/27/20   3. What type of clearance is required (medical clearance vs. Pharmacy clearance to hold med vs. Both)? MEDICAL  4. Are there any medications that need to be held prior to surgery and how long? ASA ; PER SURGEON IF PT IS ON ANY BLOOD THINNERS THEY REQUEST TO HOLD MEDICATION 7 DAYS PRE OP AND RESUME 5 DAYS POST OP  5. Practice name and name of physician performing surgery? [facility]; DR. Izell Kirkland   6. What is the office phone number? [phone]   7.   What is the office fax number? [phone]  8.   Anesthesia type (None, local, MAC, general) ? NOT LISTED   Julaine Hua 03/02/2020, 4:11 PM  _________________________________________________________________   (provider comments below)

## 2020-03-02 NOTE — Telephone Encounter (Signed)
Left VM for patient

## 2020-03-06 NOTE — Telephone Encounter (Signed)
   Primary Cardiologist: [name], MD  Chart reviewed as part of pre-operative protocol coverage. Given past medical history and time since last visit, based on ACC/AHA guidelines, SPENSER CONG would be at acceptable risk for the planned procedure without further cardiovascular testing.   I will route this recommendation to the requesting party via Epic fax function and remove from pre-op pool.  Please call with questions.  [place], [place] 03/06/2020, 12:36 PM

## 2020-03-06 NOTE — Telephone Encounter (Signed)
I s/w the pt and explained to her that our PA, Vin Bhagat tried to call her to discuss with her her upcoming surgery. Pt is agreeable to have PA call her back to discuss her surgery.

## 2020-03-08 DIAGNOSIS — R69 Illness, unspecified: Secondary | ICD-10-CM | POA: Diagnosis not present

## 2020-03-08 DIAGNOSIS — Z794 Long term (current) use of insulin: Secondary | ICD-10-CM | POA: Diagnosis not present

## 2020-03-08 DIAGNOSIS — E538 Deficiency of other specified B group vitamins: Secondary | ICD-10-CM | POA: Diagnosis not present

## 2020-03-08 DIAGNOSIS — E785 Hyperlipidemia, unspecified: Secondary | ICD-10-CM | POA: Diagnosis not present

## 2020-03-08 DIAGNOSIS — I10 Essential (primary) hypertension: Secondary | ICD-10-CM | POA: Diagnosis not present

## 2020-03-08 DIAGNOSIS — E1149 Type 2 diabetes mellitus with other diabetic neurological complication: Secondary | ICD-10-CM | POA: Diagnosis not present

## 2020-03-08 DIAGNOSIS — E1129 Type 2 diabetes mellitus with other diabetic kidney complication: Secondary | ICD-10-CM | POA: Diagnosis not present

## 2020-03-08 DIAGNOSIS — M5416 Radiculopathy, lumbar region: Secondary | ICD-10-CM | POA: Diagnosis not present

## 2020-03-22 DIAGNOSIS — Z01810 Encounter for preprocedural cardiovascular examination: Secondary | ICD-10-CM | POA: Diagnosis not present

## 2020-03-22 DIAGNOSIS — Z01818 Encounter for other preprocedural examination: Secondary | ICD-10-CM | POA: Diagnosis not present

## 2020-03-22 DIAGNOSIS — M5416 Radiculopathy, lumbar region: Secondary | ICD-10-CM | POA: Diagnosis not present

## 2020-03-22 DIAGNOSIS — M48062 Spinal stenosis, lumbar region with neurogenic claudication: Secondary | ICD-10-CM | POA: Diagnosis not present

## 2020-03-22 DIAGNOSIS — M48061 Spinal stenosis, lumbar region without neurogenic claudication: Secondary | ICD-10-CM | POA: Diagnosis not present

## 2020-03-22 NOTE — Telephone Encounter (Signed)
Deidre from [employer] is calling stating they never received anything back in regards to this clearance. She is requesting it be faxed to [phone]. Please advise.

## 2020-03-22 NOTE — Telephone Encounter (Signed)
I e-faxed again to the requesting provider, callback pool please verify they received the fax

## 2020-03-22 NOTE — Telephone Encounter (Signed)
Faxed send manually to [facility]

## 2020-03-27 DIAGNOSIS — M1990 Unspecified osteoarthritis, unspecified site: Secondary | ICD-10-CM | POA: Diagnosis not present

## 2020-03-27 DIAGNOSIS — M5136 Other intervertebral disc degeneration, lumbar region: Secondary | ICD-10-CM | POA: Diagnosis not present

## 2020-03-27 DIAGNOSIS — Z981 Arthrodesis status: Secondary | ICD-10-CM | POA: Diagnosis not present

## 2020-03-27 DIAGNOSIS — M5416 Radiculopathy, lumbar region: Secondary | ICD-10-CM | POA: Diagnosis not present

## 2020-03-27 DIAGNOSIS — M48062 Spinal stenosis, lumbar region with neurogenic claudication: Secondary | ICD-10-CM | POA: Diagnosis not present

## 2020-03-27 DIAGNOSIS — R69 Illness, unspecified: Secondary | ICD-10-CM | POA: Diagnosis not present

## 2020-03-27 DIAGNOSIS — M5116 Intervertebral disc disorders with radiculopathy, lumbar region: Secondary | ICD-10-CM | POA: Diagnosis not present

## 2020-03-27 DIAGNOSIS — I160 Hypertensive urgency: Secondary | ICD-10-CM | POA: Diagnosis not present

## 2020-03-27 DIAGNOSIS — M47817 Spondylosis without myelopathy or radiculopathy, lumbosacral region: Secondary | ICD-10-CM | POA: Diagnosis not present

## 2020-03-27 DIAGNOSIS — Z0189 Encounter for other specified special examinations: Secondary | ICD-10-CM | POA: Diagnosis not present

## 2020-03-27 DIAGNOSIS — I10 Essential (primary) hypertension: Secondary | ICD-10-CM | POA: Diagnosis not present

## 2020-03-27 DIAGNOSIS — I4891 Unspecified atrial fibrillation: Secondary | ICD-10-CM | POA: Diagnosis not present

## 2020-03-27 DIAGNOSIS — E871 Hypo-osmolality and hyponatremia: Secondary | ICD-10-CM | POA: Diagnosis not present

## 2020-03-27 DIAGNOSIS — M5135 Other intervertebral disc degeneration, thoracolumbar region: Secondary | ICD-10-CM | POA: Diagnosis not present

## 2020-03-27 DIAGNOSIS — Z9889 Other specified postprocedural states: Secondary | ICD-10-CM | POA: Diagnosis not present

## 2020-03-27 DIAGNOSIS — M545 Low back pain: Secondary | ICD-10-CM | POA: Diagnosis not present

## 2020-03-27 DIAGNOSIS — E1140 Type 2 diabetes mellitus with diabetic neuropathy, unspecified: Secondary | ICD-10-CM | POA: Diagnosis not present

## 2020-03-27 DIAGNOSIS — M48061 Spinal stenosis, lumbar region without neurogenic claudication: Secondary | ICD-10-CM | POA: Diagnosis not present

## 2020-03-27 DIAGNOSIS — E1165 Type 2 diabetes mellitus with hyperglycemia: Secondary | ICD-10-CM | POA: Diagnosis not present

## 2020-04-05 DIAGNOSIS — M4326 Fusion of spine, lumbar region: Secondary | ICD-10-CM | POA: Diagnosis not present

## 2020-04-05 DIAGNOSIS — I129 Hypertensive chronic kidney disease with stage 1 through stage 4 chronic kidney disease, or unspecified chronic kidney disease: Secondary | ICD-10-CM | POA: Diagnosis not present

## 2020-04-05 DIAGNOSIS — N1831 Chronic kidney disease, stage 3a: Secondary | ICD-10-CM | POA: Diagnosis not present

## 2020-04-05 DIAGNOSIS — E1149 Type 2 diabetes mellitus with other diabetic neurological complication: Secondary | ICD-10-CM | POA: Diagnosis not present

## 2020-04-05 DIAGNOSIS — M5416 Radiculopathy, lumbar region: Secondary | ICD-10-CM | POA: Diagnosis not present

## 2020-04-09 ENCOUNTER — Ambulatory Visit: Payer: PPO | Admitting: Neurology

## 2020-04-30 ENCOUNTER — Ambulatory Visit: Payer: Medicare HMO | Admitting: Neurology

## 2020-05-15 ENCOUNTER — Encounter: Payer: Self-pay | Admitting: Genetic Counselor

## 2020-05-18 ENCOUNTER — Other Ambulatory Visit: Payer: Self-pay | Admitting: Neurology

## 2020-05-18 MED ORDER — NORTRIPTYLINE HCL 10 MG PO CAPS: ORAL_CAPSULE | ORAL | 3 refills | Status: DC

## 2020-05-18 NOTE — Telephone Encounter (Signed)
Patient requesting refill of Nortriptyline 10 mg sent to [facility] through [facility].

## 2020-05-18 NOTE — Telephone Encounter (Signed)
Refills sent

## 2020-06-07 NOTE — Progress Notes (Deleted)
Cardiology Office Note:    Date:  06/07/2020   ID:  Melanie [name] 05/14/1946, MRN [id number]  PCP:  Marton Redwood, MD  [facility] Cardiologist:  [name], MD *** [facility] Electrophysiologist:  None   Referring MD: Marton Redwood, MD   Chief Complaint:  No chief complaint on file.    Patient Profile:    Melanie [name] is a 74 y.o. 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)  S/p lumbar spine surgery 03/2020  Tobacco abuse  Prior CV studies: *** ABIs 04/18/2019   Echocardiogram 11/11/2018 EF 50-55, mild LVH, Gr 1 DD, no RWMA, normal RVSF, mild AoV sclerosis   Carotid US 10/26/2018 Bilat ICA 1-39; R subclavian stenosis   Event monitor 10/2018  NSR  1 episode of a 5 beat run of nonsustained ventricular tachycardia  Echo 01/05/2018 Moderate LVH, EF 50-55, normal wall motion, grade 1 diastolic dysfunction, mildly calcified aortic valve leaflets, MAC  Nuclear stress test 10/16/16 Low risk stress nuclear study with apical thinning and prior inferior basal infarct with mild peri-infarct ischemia; EF 45 with akinesis of the basal inferior wall; mild LVE.- Reviewed byDr. Jeni Salles felt to be low risk  Carotid US 07/17/14 BilateralICA < 40  Nuclear stress test 03/2006 EF 59, normal perfusion  Cardiac catheterization 04/23/2001 LAD mid irregularities Normal EF  Echo 06/06/1998 Conc LVH, normal LVEF  History of Present Illness:    Melanie [name] was last seen by Dr. Acie Fredrickson in December 2020.  ***      Past Medical History:  Diagnosis Date  . Arthritis   . Bradycardia   . Depression    under control  . DM (diabetes mellitus) ([place])    type 2  . Dyspnea    with some exertion  . Family history of  anesthesia complication    mother-nausea and vomiting  . Family history of breast cancer   . Family history of ovarian cancer   . GERD (gastroesophageal reflux disease)    occasional  . Headache   . HTN (hypertension)   . Neuropathy    legs and feet  . Neuropathy    feet  . PAF (paroxysmal atrial fibrillation) (H[facility]) 10 years ago   no problems since, due to stress  . Situational stress    "was taking care of sick husband"    Current Medications: No outpatient medications have been marked as taking for the 06/08/20 encounter (Appointment) with Richardson Dopp T, PA-C.     Allergies:   Fenofibrate, Rosuvastatin, and Metoprolol   Social History   Tobacco Use  . Smoking status: Current Every Day Smoker    Packs/day: 0.50    Years: 30.00    Pack years: 15.00    Types: Cigarettes  . Smokeless tobacco: Never Used  Vaping Use  . Vaping Use: Never used  Substance Use Topics  . Alcohol use: No    Alcohol/week: 0.0 standard drinks  . Drug use: No     Family Hx: The patient's family history includes Breast cancer in her sister; Cancer in her maternal uncle and maternal uncle; Cancer (age of onset: 88) in her cousin; Heart attack in her father; Hypertension in her mother and sister; Multiple sclerosis in her sister; Neuropathy in her mother and  sister; Other in her mother; Ovarian cancer in her sister; Stomach cancer in her paternal uncle; Stroke in her sister.  ROS   EKGs/Labs/Other Test Reviewed:    EKG:  EKG is *** ordered today.  The ekg ordered today demonstrates ***  Recent Labs: 08/19/2019: ALT 22   Recent Lipid Panel Lab Results  Component Value Date/Time   CHOL 94 (L) 08/19/2019 08:05 AM   TRIG 222 (H) 08/19/2019 08:05 AM   HDL 35 (L) 08/19/2019 08:05 AM   CHOLHDL 2.7 08/19/2019 08:05 AM   LDLCALC 25 08/19/2019 08:05 AM    Physical Exam:    VS:  There were no vitals taken for this visit.    Wt Readings from Last 3 Encounters:  02/08/20 200 lb (90.7 kg)    08/23/19 202 lb (91.6 kg)  07/28/19 201 lb 3.2 oz (91.3 kg)     Physical Exam ***  ASSESSMENT & PLAN:    ***  Dispo:  No follow-ups on file.   Medication Adjustments/Labs and Tests Ordered: Current medicines are reviewed at length with the patient today.  Concerns regarding medicines are outlined above.  Tests Ordered: No orders of the defined types were placed in this encounter.  Medication Changes: No orders of the defined types were placed in this encounter.   Signed, Richardson Dopp, PA-C  06/07/2020 10:28 PM    [facility] [place], [place], [place]  [address] Phone: [phone]; Fax: [phone]

## 2020-06-08 ENCOUNTER — Ambulatory Visit: Payer: Medicare HMO | Admitting: Physician Assistant

## 2020-06-26 DIAGNOSIS — Z4789 Encounter for other orthopedic aftercare: Secondary | ICD-10-CM | POA: Diagnosis not present

## 2020-07-05 ENCOUNTER — Other Ambulatory Visit: Payer: Self-pay | Admitting: Internal Medicine

## 2020-07-05 DIAGNOSIS — Z1231 Encounter for screening mammogram for malignant neoplasm of breast: Secondary | ICD-10-CM

## 2020-07-09 ENCOUNTER — Other Ambulatory Visit: Payer: Self-pay

## 2020-07-09 ENCOUNTER — Ambulatory Visit (INDEPENDENT_AMBULATORY_CARE_PROVIDER_SITE_OTHER): Payer: Medicare HMO | Admitting: Neurology

## 2020-07-09 ENCOUNTER — Encounter: Payer: Self-pay | Admitting: Neurology

## 2020-07-09 VITALS — BP 132/68 | HR 83 | Ht 67.0 in | Wt 191.0 lb

## 2020-07-09 DIAGNOSIS — E1142 Type 2 diabetes mellitus with diabetic polyneuropathy: Secondary | ICD-10-CM | POA: Diagnosis not present

## 2020-07-09 NOTE — Patient Instructions (Addendum)
Return to clinic in 1 year.

## 2020-07-09 NOTE — Progress Notes (Signed)
Follow-up Visit   Date: 07/09/20    CHELLIE VANLUE MRN: [id number] DOB: 08/17/1946   Interim History: Melanie [name] is a 74 y.o. 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   She lost her husband almost 1 year ago and has been having a difficult month of October.  She has adequate social support from her sons, but still has challenging days.  She also had back surgery again over the summer and has been recovering from this.  Her numbness extends into her lower legs and bothers her the most.  Pain is well-controlled on gabapentin 900mg  TID and nortriptyline 40mg  at bedtime. She complains of right hip pain which has been making it difficult to walk.  Medications:  Current Outpatient Medications on File Prior to Visit  Medication Sig Dispense Refill  . Alirocumab (PRALUENT) 75 MG/ML SOAJ Inject 1 pen into the skin every 14 (fourteen) days. 6 pen 3  . amLODipine (NORVASC) 2.5 MG tablet Take 1 tablet (2.5 mg total) by mouth daily. 90 tablet 3  . anastrozole (ARIMIDEX) 1 MG tablet Take 1 tablet (1 mg total) by mouth daily. 90 tablet 3  . aspirin EC 81 MG tablet Take 81 mg by mouth daily.     . bisoprolol (ZEBETA) 5 MG tablet Take 1 tablet (5 mg total) by mouth daily. 90 tablet 3  . Cyanocobalamin (VITAMIN B-12 IJ) Inject 1,000 Units as directed every 30 (thirty) days.    Marland Kitchen gabapentin (NEURONTIN) 300 MG capsule Take 3 capsules (900 mg total) by mouth 3 (three) times daily. 810 capsule 3  . glipiZIDE (GLUCOTROL) 5 MG tablet Take 5 mg by mouth 2 (two) times daily before a meal.     . hydrochlorothiazide (MICROZIDE) 12.5 MG capsule Take 1 capsule (12.5 mg total) by mouth daily. 90 capsule 3  . HYDROcodone-acetaminophen (NORCO/VICODIN) 5-325 MG tablet Take 1-2  tablets by mouth every 6 (six) hours as needed for moderate pain. 30 tablet 0  . HYDROcodone-acetaminophen (NORCO/VICODIN) 5-325 MG tablet Take 1 tablet by mouth every 6 (six) hours as needed for moderate pain. 30 tablet 0  . insulin detemir (LEVEMIR) 100 UNIT/ML injection Inject 70 Units into the skin at bedtime.     Marland Kitchen lisinopril (ZESTRIL) 20 MG tablet Take 1 tablet (20 mg total) by mouth daily. 90 tablet 1  . metFORMIN (GLUCOPHAGE) 1000 MG tablet Take 1,000 mg by mouth 2 (two) times daily.    . nitroGLYCERIN (NITROSTAT) 0.4 MG SL tablet Place 1 tablet (0.4 mg total) under the tongue every 5 (five) minutes as needed. 25 tablet 3  . nortriptyline (PAMELOR) 10 MG capsule TAKE 4 CAPSULES BY MOUTH AT BEDTIME 360 capsule 3  . Omega-3 Fatty Acids (FISH OIL PO) Take 2,000 Units by mouth 2 (two) times daily.    . sertraline (ZOLOFT) 50 MG tablet Take 50 mg by mouth at bedtime.     Marland Kitchen acetaminophen-codeine (TYLENOL #3) 300-30 MG tablet Take 1-2 tablets by mouth every 8 (eight) hours as needed for moderate pain. (Patient not taking: Reported on 07/09/2020) 30 tablet 0   No current facility-administered medications on file prior to visit.    Allergies:  Allergies  Allergen Reactions  . Fenofibrate Other (See Comments)    "muscle spasms"  . Rosuvastatin     Muscle aches  . Metoprolol Other (See  Comments)    Headache    Review of Systems:  CONSTITUTIONAL: No fevers, chills, night sweats, +weight loss.   EYES: No visual changes or eye pain ENT: No hearing changes.  No history of nose bleeds.   RESPIRATORY: No cough, wheezing and shortness of breath.   CARDIOVASCULAR: Negative for chest pain, and palpitations.   GI: Negative for abdominal discomfort, blood in stools or black stools.  No recent change in bowel habits.   GU:  No history of incontinence.   MUSCLOSKELETAL: No history of joint pain or swelling.  No myalgias.   SKIN: Negative for lesions, rash, and itching.   ENDOCRINE: Negative for  cold or heat intolerance, polydipsia or goiter.   PSYCH:  +depression or anxiety symptoms.   NEURO: As Above.   Vital Signs:  BP 132/68   Pulse 83   Ht 5\' 7"  (1.702 m)   Wt 191 lb (86.6 kg)   SpO2 97%   BMI 29.91 kg/m   Neurological Exam: MENTAL STATUS including orientation to time, place, person, recent and remote memory, attention span and concentration, language, and fund of knowledge is normal.    CRANIAL NERVES: Pupils are around and reactive to light.  Extraocular muscles intact.  Face is symmetric   MOTOR:  Motor strength is 5/5 in all extremities, including distally in the feet.    MSRs:  Reflexes are 2+/4 in the upper extremities and absent in the lower extremities.   SENSORY:  Vibration is diminished to 50% at her ankles, trace at the great toe bilaterally.  Vibration intact at the knees.   COORDINATION/GAIT:  Gait is stable and unassisted.  Data: MRI lumbar spine 04/03/2017: 1. Previously seen left subarticular disc protrusion L3-L4 has involuted or has been resected. There remains severe left L3-L4 neural foraminal stenosis due to foraminal disc protrusion superimposed on a diffuse disc bulge. 2. Unchanged moderate right L4-L5 neural foraminal stenosis secondary to endplate spurring. 3. Posterior decompression at L2-L4 with wide patency of the thecal sac.   IMPRESSION/PLAN: 1.  Diabetic neuropathy affecting the feet and lower legs, pain is well-controlled, but she has progressive numbness  - Continue gabapentin 900mg  three times daily  - Continue nortriptyline 40mg  at bedtime  - Patient informed that there is no effective medication for numbness  2.  Grief reaction. Much of today's visit was providing social support for her loss.   3.  Right hip pain  - Follow-up with PCP  Return to clinic in 1 year  Total time spent reviewing records, interview, history/exam, documentation, and coordination of care on day of encounter:  30 min      Thank you for  allowing me to participate in patient's care.  If I can answer any additional questions, I would be pleased to do so.    Sincerely,    Belal Scallon K. Posey Pronto, DO

## 2020-07-10 DIAGNOSIS — E1149 Type 2 diabetes mellitus with other diabetic neurological complication: Secondary | ICD-10-CM | POA: Diagnosis not present

## 2020-07-10 DIAGNOSIS — I129 Hypertensive chronic kidney disease with stage 1 through stage 4 chronic kidney disease, or unspecified chronic kidney disease: Secondary | ICD-10-CM | POA: Diagnosis not present

## 2020-07-10 DIAGNOSIS — E538 Deficiency of other specified B group vitamins: Secondary | ICD-10-CM | POA: Diagnosis not present

## 2020-07-10 DIAGNOSIS — I6529 Occlusion and stenosis of unspecified carotid artery: Secondary | ICD-10-CM | POA: Diagnosis not present

## 2020-07-10 DIAGNOSIS — E781 Pure hyperglyceridemia: Secondary | ICD-10-CM | POA: Diagnosis not present

## 2020-07-10 DIAGNOSIS — N1831 Chronic kidney disease, stage 3a: Secondary | ICD-10-CM | POA: Diagnosis not present

## 2020-07-10 DIAGNOSIS — Z794 Long term (current) use of insulin: Secondary | ICD-10-CM | POA: Diagnosis not present

## 2020-07-10 DIAGNOSIS — Z23 Encounter for immunization: Secondary | ICD-10-CM | POA: Diagnosis not present

## 2020-07-10 DIAGNOSIS — E1129 Type 2 diabetes mellitus with other diabetic kidney complication: Secondary | ICD-10-CM | POA: Diagnosis not present

## 2020-07-10 DIAGNOSIS — G720 Drug-induced myopathy: Secondary | ICD-10-CM | POA: Diagnosis not present

## 2020-07-10 DIAGNOSIS — I209 Angina pectoris, unspecified: Secondary | ICD-10-CM | POA: Diagnosis not present

## 2020-07-17 ENCOUNTER — Other Ambulatory Visit: Payer: Self-pay | Admitting: *Deleted

## 2020-07-17 NOTE — Patient Outreach (Signed)
[facility]) Care Management  07/17/2020  GAYTHA RAYBOURN [birth year] [id number]   Referral Date: 11/2 Referral Source: MD office Referral Reason: Pharmacy assistance with [employer] Insurance: [place] attempt #1, unsuccessful, unable to leave message.  Plan: RN CM will send unsuccessful outreach letter and follow up within the next 3-4 business days.  Valente David, [place], MSN [place] [phone]

## 2020-07-20 ENCOUNTER — Other Ambulatory Visit: Payer: Self-pay | Admitting: *Deleted

## 2020-07-20 NOTE — Patient Outreach (Signed)
Received a Pharmacy referral.  I hav esent the referral to the [place] team through [place].

## 2020-07-20 NOTE — Patient Outreach (Signed)
[facility]) Care Management  07/20/2020  Melanie [name] 1946/07/03 [id number]    Referral Date: 11/2 Referral Source: MD office Referral Reason: Pharmacy assistance with [employer] Insurance: [place] attempt #2, successful.  Identity verified.  This care manager introduced self and stated purpose of call.  [facility] care management services explained.    Social: Member lives alone and is independent in all ADL/IADLs, has son and grandson in the area that are helpful when needed.  State her husband passed away a year ago and she is in the process of selling her home to downsize.  She is active with grief counseling through [facility].  Denies needing any additional counseling services.  Conditions: Per chart, has history of A-fib, HTN, DM (A1C - 6.1), CKD, HLD, breast cancer, and vitamin D deficiency.  Medications: Reviewed with member, state she is compliant.  Uses [facility] for mail delivery, report all medications being affordable with the exception of Tresiba.  She was provided a sample by her PCP that should last about 30 days (taking 20 units daily).    Appointments: Was seen by PCP on 11/2.  Will have follow up appointments for mammogram on 11/23 and with oncology on 12/2.  Will also see cardiology for yearly visit on 12/8.  Provide her own transportation to all appointments.    Does not have any complex needs/concerns, agrees to ongoing involvement for disease management.  Plan: RN CM will place referral to health coach.  Valente David, [place], MSN [place] [phone]

## 2020-08-07 ENCOUNTER — Other Ambulatory Visit: Payer: Self-pay

## 2020-08-07 ENCOUNTER — Ambulatory Visit
Admission: RE | Admit: 2020-08-07 | Discharge: 2020-08-07 | Disposition: A | Discharge status: Home / Self Care | Payer: Medicare HMO | Source: Ambulatory Visit | Attending: Internal Medicine | Admitting: Internal Medicine

## 2020-08-07 DIAGNOSIS — Z1231 Encounter for screening mammogram for malignant neoplasm of breast: Secondary | ICD-10-CM | POA: Diagnosis not present

## 2020-08-14 ENCOUNTER — Other Ambulatory Visit: Payer: Self-pay | Admitting: Internal Medicine

## 2020-08-14 ENCOUNTER — Other Ambulatory Visit: Payer: Self-pay | Admitting: *Deleted

## 2020-08-14 DIAGNOSIS — R928 Other abnormal and inconclusive findings on diagnostic imaging of breast: Secondary | ICD-10-CM

## 2020-08-14 NOTE — Patient Outreach (Signed)
[facility]) Care Management  08/14/2020  Melanie [name] December 09, 1945 [id number]  Unsuccessful outreach attempt made to patient. RN Health Coach left HIPAA compliant voicemail message along with her contact information.  Plan: RN Health Coach will call patient within a month.  Melanie Loron RN, BSN [place] [phone] [email or web address]

## 2020-08-15 NOTE — Progress Notes (Signed)
[facility]  [phone] [phone]     ID: Melanie [name] DOB: 02-13-1946  MR#: [id number]  PVV#:[id number]  Patient Care Team: Marton Redwood, MD as PCP - General (Internal Medicine) Nahser, Wonda Cheng, MD as PCP - Cardiology (Cardiology) Shyleigh Daughtry, [name], MD as Consulting Physician (Oncology) Coralie Keens, MD as Consulting Physician (General Surgery) [name], DO as Consulting Physician (Neurology) Paula Compton, MD as Consulting Physician (Obstetrics and Gynecology) [name], MD as Consulting Physician (Orthopedic Surgery) Michiel Cowboy, RN as [facility] OTHER MD:  CHIEF COMPLAINT: Atypical ductal hyperplasia, lobular carcinoma in situ  CURRENT TREATMENT: Anastrozole   INTERVAL HISTORY: Melanie [name] returns today for follow-up of her atypical ductal hyperplasia, 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   She continues on anastrozole.  Hot flashes and vaginal dryness are not a problem for her.  Since her last visit, she underwent bilateral screening mammography with tomography at [facility] on 08/07/2020 showing: breast density category C; possible right breast mass.  She is scheduled for right diagnostic mammography and right breast ultrasonography on 08/24/2020.   REVIEW OF SYSTEMS: Melanie [name] is still mourning her husband's death.  Now a little over a year later she is planning to sell her house and moved to be a little closer to her son in [place].  She did enjoy the holidays and had a grandson in the [employer] come in for the holiday.  She had back surgery in July and she says that went well.  She tells me she had a bone density under Dr. Brigitte Pulse and that it was "fine".   COVID 19 VACCINATION STATUS: Status post Pfizer x2, most recent dose March 2021; no booster as of November 2021   HISTORY OF CURRENT ILLNESS: From the original intake note:  LEZLIE RITCHEY had routine screening mammography  on 09/22/2017 showing a possible abnormality in the right breast. She underwent bilateral diagnostic mammography with tomography and right breast ultrasonography at [facility] on 09/25/2017 showing: Indeterminate right breast mass in the central lateral right breast at middle depth. identified mammographically without associated ultrasound correlate. Evaluation of the right axilla demonstrates no suspicious adenopathy.   Accordingly on 10/01/2017 she proceeded to biopsy of the right breast area in question. The pathology from this procedure showed ([id number]): lobular carcinoma in situ. Atypical ductal hyperplasia. Fibrocystic changes.   On 10/27/2017, she underwent right breast lumpectomy showing ([id number]) focal atypical ductal hyperplasia and sclerosing adenosis. Fibroadenoma. Previous biopsy site and biopsy clip. No residual lobular carcinoma in situ. No invasive carcinoma.   The patient's subsequent history is as detailed below.   PAST MEDICAL HISTORY: Past Medical History:  Diagnosis Date  . Arthritis   . Bradycardia   . Depression    under control  . DM (diabetes mellitus) ([place])    type 2  . Dyspnea    with some exertion  . Family history of anesthesia complication    mother-nausea and vomiting  . Family history of breast cancer   . Family history of ovarian cancer   . GERD (gastroesophageal reflux disease)    occasional  . Headache   . HTN (hypertension)   . Neuropathy    legs and feet  . Neuropathy    feet  . PAF (paroxysmal atrial fibrillation) (HCC) 10 years ago   no problems since, due to stress  . Situational stress    "was taking care of sick husband"  stress related heart palpitations in 1998  PAST SURGICAL HISTORY: Past Surgical History:  Procedure Laterality Date  . ABDOMINAL HYSTERECTOMY  1980   partial  . APPENDECTOMY  1980  . BACK SURGERY  2004  . BREAST EXCISIONAL BIOPSY Right 2019   LC[facility]; [facility]  . BREAST LUMPECTOMY WITH RADIOACTIVE SEED  LOCALIZATION Right 10/27/2017   Procedure: RIGHT BREAST PARTIAL MASTECTIOMY WITH RADIOACTIVE SEED LOCALIZATION ERAS PATHWAY;  Surgeon: Coralie Keens, MD;  Location: [facility];  Service: General;  Laterality: Right;  . CARDIAC CATHETERIZATION     in the late 90's/early 2000's  . CARPAL TUNNEL RELEASE Bilateral 2003, 2004  . CHOLECYSTECTOMY N/A 12/28/2014   Procedure: LAPAROSCOPIC CHOLECYSTECTOMY WITH INTRAOPERATIVE CHOLANGIOGRAM;  Surgeon: Armandina Gemma, MD;  Location: [facility];  Service: General;  Laterality: N/A;  . COLONOSCOPY  8 years ago  . JOINT REPLACEMENT Bilateral 2007  . KNEE ARTHROSCOPY Bilateral 1990's  . LAMINECTOMY  11/28/2016   L 3   . LUMBAR LAMINECTOMY N/A 07/20/2013   Procedure: MICRODISCECTOMY LUMBAR LAMINECTOMY;  Surgeon: [name], MD;  Location: [facility];  Service: Orthopedics;  Laterality: N/A;  L2-3 Decompression  . LUMBAR LAMINECTOMY/DECOMPRESSION MICRODISCECTOMY N/A 11/28/2016   Procedure: L3 LAMINECTOMY, PARTIAL LAMINECTOMY WITH REMOVAL OF FREE FRAGMENT LEFT L3-4;  Surgeon: [name], MD;  Location: [facility];  Service: Orthopedics;  Laterality: N/A;  . LUMBAR LAMINECTOMY/DECOMPRESSION MICRODISCECTOMY N/A 02/18/2017   Procedure: L3-4 MICRODISCECTOMY;  Surgeon: [name], MD;  Location: [facility];  Service: Orthopedics;  Laterality: N/A;    FAMILY HISTORY Family History  Problem Relation Age of Onset  . Heart attack Father        Deceased, 31  . Other Mother        Deceased, 82  . Neuropathy Mother   . Hypertension Mother   . Breast cancer Sister        87's  . Ovarian cancer Sister        dx and died in 33's  . Multiple sclerosis Sister   . Neuropathy Sister   . Stroke Sister   . Hypertension Sister   . Cancer Maternal Uncle        dx >50, type unk  . Stomach cancer Paternal Uncle        dx >50  . Cancer Maternal Uncle        dx >50, type unk  . Cancer Cousin 60       type of cancer unk  The patient's father died of a heart attack at age 68. The patient's  mother died due to old age at 87.  The patient has no brothers and 3 sisters. The patient's youngest sister had breast cancer and passed away 5 years later due to ovarian cancer, which was diagnosed in her early 40's.  The patient denies any other family members with  breast or ovarian cancer.    GYNECOLOGIC HISTORY:  Menarche: 74 years old Age at first live birth: 74 years old Melanie [name] P2 LMP: status post hysterectomy without BSO at age 27 She used contraceptives for about 2 years. She did not use HRTs.   SOCIAL HISTORY:  Melanie [name] is now retired from working in a [employer] in [place] and [place]. Her husband, Evelena Peat, who had been disabled with neck issues for the last 20 years, passed away from what sounds like metastatic esophageal cancer in 2020. 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 At home is 2 dogs and a cat. The patient's oldest son, Nicki Reaper, works for  the [employer] and lives in [place], [place].  The patient's youngest son, Sharee Pimple, works at [employer] in [place]. The patient has 3 grandchildren. She attends [employer].    ADVANCED DIRECTIVES: To be discussed   HEALTH MAINTENANCE: Social History   Tobacco Use  . Smoking status: Current Every Day Smoker    Packs/day: 0.50    Years: 30.00    Pack years: 15.00    Types: Cigarettes  . Smokeless tobacco: Never Used  Vaping Use  . Vaping Use: Never used  Substance Use Topics  . Alcohol use: No    Alcohol/week: 0.0 standard drinks  . Drug use: No     Colonoscopy: 10 years ago at [facility]/ normal  PAP: s/p hysterctomy  Bone density: at Dr. Brigitte Pulse, normal per patient's report   Allergies  Allergen Reactions  . Fenofibrate Other (See Comments)    "muscle spasms"  . Rosuvastatin     Muscle aches  . Metoprolol Other (See Comments)    Headache    Current Outpatient Medications  Medication Sig Dispense Refill  . acetaminophen-codeine (TYLENOL #3) 300-30 MG tablet Take 1-2  tablets by mouth every 8 (eight) hours as needed for moderate pain. (Patient not taking: Reported on 07/09/2020) 30 tablet 0  . Alirocumab (PRALUENT) 75 MG/ML SOAJ Inject 1 pen into the skin every 14 (fourteen) days. 6 pen 3  . amLODipine (NORVASC) 2.5 MG tablet Take 1 tablet (2.5 mg total) by mouth daily. 90 tablet 3  . anastrozole (ARIMIDEX) 1 MG tablet Take 1 tablet (1 mg total) by mouth daily. 90 tablet 3  . aspirin EC 81 MG tablet Take 81 mg by mouth daily.     . bisoprolol (ZEBETA) 5 MG tablet Take 1 tablet (5 mg total) by mouth daily. 90 tablet 3  . Cyanocobalamin (VITAMIN B-12 IJ) Inject 1,000 Units as directed every 30 (thirty) days.    Marland Kitchen gabapentin (NEURONTIN) 300 MG capsule Take 3 capsules (900 mg total) by mouth 3 (three) times daily. 810 capsule 3  . glipiZIDE (GLUCOTROL) 5 MG tablet Take 5 mg by mouth 2 (two) times daily before a meal.     . hydrochlorothiazide (MICROZIDE) 12.5 MG capsule Take 1 capsule (12.5 mg total) by mouth daily. 90 capsule 3  . HYDROcodone-acetaminophen (NORCO/VICODIN) 5-325 MG tablet Take 1-2 tablets by mouth every 6 (six) hours as needed for moderate pain. 30 tablet 0  . HYDROcodone-acetaminophen (NORCO/VICODIN) 5-325 MG tablet Take 1 tablet by mouth every 6 (six) hours as needed for moderate pain. 30 tablet 0  . insulin detemir (LEVEMIR) 100 UNIT/ML injection Inject 70 Units into the skin at bedtime.     Marland Kitchen lisinopril (ZESTRIL) 20 MG tablet Take 1 tablet (20 mg total) by mouth daily. 90 tablet 1  . metFORMIN (GLUCOPHAGE) 1000 MG tablet Take 1,000 mg by mouth 2 (two) times daily.    . nitroGLYCERIN (NITROSTAT) 0.4 MG SL tablet Place 1 tablet (0.4 mg total) under the tongue every 5 (five) minutes as needed. 25 tablet 3  . nortriptyline (PAMELOR) 10 MG capsule TAKE 4 CAPSULES BY MOUTH AT BEDTIME 360 capsule 3  . Omega-3 Fatty Acids (FISH OIL PO) Take 2,000 Units by mouth 2 (two) times daily.    . sertraline (ZOLOFT) 50 MG tablet Take 50 mg by mouth at bedtime.       No current facility-administered medications for this visit.    OBJECTIVE: White woman in no acute distress  Vitals:   08/16/20  1124  BP: (!) 145/66  Pulse: 73  Resp: 18  Temp: 97.7 F (36.5 C)  SpO2: 99%     Body mass index is 30.38 kg/m.   Wt Readings from Last 3 Encounters:  08/16/20 194 lb (88 kg)  07/09/20 191 lb (86.6 kg)  02/08/20 200 lb (90.7 kg)      ECOG FS:1 - Symptomatic but completely ambulatory  Sclerae unicteric, EOMs intact Wearing a mask No cervical or supraclavicular adenopathy Lungs no rales or rhonchi Heart regular rate and rhythm Abd soft, nontender, positive bowel sounds MSK no focal spinal tenderness, no upper extremity lymphedema Neuro: nonfocal, well oriented, appropriate affect Breasts: The right breast is status post lumpectomy with no evidence of disease recurrence.  The left breast and both axillae are benign   LAB RESULTS:  CMP     Component Value Date/Time   NA 138 08/16/2020 1059   K 4.8 08/16/2020 1059   CL 103 08/16/2020 1059   CO2 27 08/16/2020 1059   GLUCOSE 106 (H) 08/16/2020 1059   BUN 13 08/16/2020 1059   CREATININE 1.21 (H) 08/16/2020 1059   CREATININE 1.20 (H) 11/10/2017 1504   CALCIUM 9.8 08/16/2020 1059   PROT 7.1 08/16/2020 1059   PROT 6.6 08/19/2019 0805   ALBUMIN 3.6 08/16/2020 1059   ALBUMIN 4.2 08/19/2019 0805   AST 31 08/16/2020 1059   AST 10 11/10/2017 1504   ALT 24 08/16/2020 1059   ALT 10 11/10/2017 1504   ALKPHOS 69 08/16/2020 1059   BILITOT 0.3 08/16/2020 1059   BILITOT 0.2 08/19/2019 0805   BILITOT 0.3 11/10/2017 1504   GFRNONAA 48 (L) 08/16/2020 1059   GFRNONAA 45 (L) 11/10/2017 1504   GFRAA 52 (L) 11/10/2017 1504    Lab Results  Component Value Date   WBC 6.7 08/16/2020   NEUTROABS 3.8 08/16/2020   HGB 11.5 (L) 08/16/2020   HCT 35.6 (L) 08/16/2020   MCV 94.2 08/16/2020   PLT 213 08/16/2020   No results found for: LABCA2  No components found for: TDSKAJ681  No results for  input(s): INR in the last 168 hours.  No results found for: LABCA2  No results found for: LXB262  No results found for: MBT597  No results found for: CBU384  No results found for: CA2729  No components found for: HGQUANT  No results found for: CEA1 / No results found for: CEA1   No results found for: AFPTUMOR  No results found for: [facility]  Lab Results  Component Value Date   TOTALPROTELP 7.4 09/22/2013   ALBUMINELP 61.6 09/22/2013   A1GS 3.4 09/22/2013   A2GS 11.5 09/22/2013   BETS 7.1 09/22/2013   BETA2SER 4.2 09/22/2013   GAMS 12.2 09/22/2013   MSPIKE NOT DET 09/22/2013   SPEI  09/22/2013     Comment:     The possibility of a faint restricted band(s) cannot be completely excluded in the gamma region.  Suggest serum IFE to evaluate possibility, if clinically indicated. (Lab will hold sample one week. Please call Customer Service at [phone] to add test.) Reviewed by Odis Hollingshead, MD, PhD, FCAP (Electronic Signature on File)   (this displays SPEP labs)  Lab Results  Component Value Date   KAPLAMBRATIO 8.29 09/22/2013   (kappa/lambda light chains)  No results found for: HGBA, HGBA2QUANT, HGBFQUANT, HGBSQUAN (Hemoglobinopathy evaluation)   No results found for: LDH  No results found for: IRON, TIBC, IRONPCTSAT (Iron and TIBC)  No results found for: FERRITIN  Urinalysis  Component Value Date/Time   COLORURINE YELLOW 06/05/2017 0052   APPEARANCEUR HAZY (A) 06/05/2017 0052   LABSPEC 1.015 06/05/2017 0052   PHURINE 5.0 06/05/2017 0052   GLUCOSEU NEGATIVE 06/05/2017 0052   HGBUR NEGATIVE 06/05/2017 0052   BILIRUBINUR NEGATIVE 06/05/2017 0052   KETONESUR NEGATIVE 06/05/2017 0052   PROTEINUR NEGATIVE 06/05/2017 0052   NITRITE NEGATIVE 06/05/2017 0052   LEUKOCYTESUR MODERATE (A) 06/05/2017 0052     STUDIES: MM 3D SCREEN BREAST BILATERAL  Result Date: 08/11/2020 CLINICAL DATA:  Screening. EXAM: DIGITAL SCREENING BILATERAL MAMMOGRAM  WITH TOMO AND CAD COMPARISON:  Previous exam(s). ACR Breast Density Category c: The breast tissue is heterogeneously dense, which may obscure small masses. FINDINGS: In the right breast, a possible mass warrants further evaluation. In the left breast, no findings suspicious for malignancy. Images were processed with CAD. IMPRESSION: Further evaluation is suggested for a possible mass in the right breast. RECOMMENDATION: Diagnostic mammogram and possibly ultrasound of the right breast. (Code:[id number]) The patient will be contacted regarding the findings, and additional imaging will be scheduled. BI-RADS CATEGORY  0: Incomplete. Need additional imaging evaluation and/or prior mammograms for comparison. Electronically Signed   By: Dorise Bullion III M.D   On: 08/11/2020 10:10    ELIGIBLE FOR AVAILABLE RESEARCH PROTOCOL: no  ASSESSMENT: 74 y.o. [place] woman status post right breast biopsy 10/01/2017 showing lobular carcinoma in situ and atypical ductal hyperplasia  (1) right lumpectomy 10/27/2017 shows focal atypical ductal hyperplasia  (2) additional cancer risk factors:   (a) family history of breast and ovarian cancer  (b) breast density category C  (3) anastrozole started 11/09/2017  (a) baseline bone density reportedly normal  (4) intensified screening   (a) breast MRI 11/30/2017 showed no evidence of malignancy  (b) intensified screening discontinued after her November 2020 for financial reasons  (5) genetics testing 01/27/2018 identified a 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 STK11, TP53, TSC1, TSC2, and VHL.  The following genes were evaluated for sequence changes only: SDHA and HOXB13 c.251G>A variant only.   PLAN: Melanie [name] continues on anastrozole for breast cancer risk reduction.  She tolerates this well.  The plan is to continue a total of 5 years.  I have encouraged her to exercise more.  Currently of course she is doing a lot of packing and planning to move to a smaller place near her [place] son.  I encouraged her to get the [place] booster shot whenever she feels comfortable doing it  Her breasts are still a bit dense and it is not surprising that she gets called back so frequently.  I am expecting the biopsy if any is taken 08/24/2020 to be benign but if not of course I will see her shortly after that.  Otherwise she will see me again in 1 year.  She knows to call for any other issue that may develop before the next visit.  Ravonda Brecheen, [name], MD  08/16/20 1:11 PM Medical Oncology and Hematology [facility] [place], [place] [address] Tel. [phone]    Fax. [phone]  I, Wilburn Mylar, am acting as scribe for Dr. Sarajane Jews C. Lameeka Schleifer.  I, Lurline Del MD, have reviewed the above documentation for accuracy and completeness, and I agree with the above.   *Total Encounter Time as defined by the Centers for Medicare and Medicaid Services includes, in addition to the face-to-face time of a patient visit (documented in the note above) non-face-to-face time: obtaining and reviewing outside history, ordering and reviewing medications, tests or procedures, care coordination (communications with other health care professionals or caregivers) and documentation in the medical record.

## 2020-08-16 ENCOUNTER — Inpatient Hospital Stay: Payer: Medicare HMO | Attending: Oncology

## 2020-08-16 ENCOUNTER — Inpatient Hospital Stay: Payer: Medicare HMO | Admitting: Oncology

## 2020-08-16 ENCOUNTER — Other Ambulatory Visit: Payer: Self-pay

## 2020-08-16 VITALS — BP 145/66 | HR 73 | Temp 97.7°F | Resp 18 | Ht 67.0 in | Wt 194.0 lb

## 2020-08-16 DIAGNOSIS — Z79899 Other long term (current) drug therapy: Secondary | ICD-10-CM | POA: Insufficient documentation

## 2020-08-16 DIAGNOSIS — D0501 Lobular carcinoma in situ of right breast: Secondary | ICD-10-CM

## 2020-08-16 DIAGNOSIS — Z8249 Family history of ischemic heart disease and other diseases of the circulatory system: Secondary | ICD-10-CM | POA: Diagnosis not present

## 2020-08-16 DIAGNOSIS — F17210 Nicotine dependence, cigarettes, uncomplicated: Secondary | ICD-10-CM | POA: Insufficient documentation

## 2020-08-16 DIAGNOSIS — D0511 Intraductal carcinoma in situ of right breast: Secondary | ICD-10-CM | POA: Insufficient documentation

## 2020-08-16 DIAGNOSIS — Z823 Family history of stroke: Secondary | ICD-10-CM | POA: Diagnosis not present

## 2020-08-16 DIAGNOSIS — Z800 Family history of malignant neoplasm of digestive organs: Secondary | ICD-10-CM | POA: Insufficient documentation

## 2020-08-16 DIAGNOSIS — Z7182 Exercise counseling: Secondary | ICD-10-CM | POA: Diagnosis not present

## 2020-08-16 DIAGNOSIS — N6099 Unspecified benign mammary dysplasia of unspecified breast: Secondary | ICD-10-CM | POA: Diagnosis not present

## 2020-08-16 DIAGNOSIS — Z8269 Family history of other diseases of the musculoskeletal system and connective tissue: Secondary | ICD-10-CM | POA: Insufficient documentation

## 2020-08-16 DIAGNOSIS — N6091 Unspecified benign mammary dysplasia of right breast: Secondary | ICD-10-CM

## 2020-08-16 DIAGNOSIS — Z809 Family history of malignant neoplasm, unspecified: Secondary | ICD-10-CM | POA: Diagnosis not present

## 2020-08-16 DIAGNOSIS — Z803 Family history of malignant neoplasm of breast: Secondary | ICD-10-CM | POA: Insufficient documentation

## 2020-08-16 DIAGNOSIS — Z820 Family history of epilepsy and other diseases of the nervous system: Secondary | ICD-10-CM | POA: Diagnosis not present

## 2020-08-16 DIAGNOSIS — Z9049 Acquired absence of other specified parts of digestive tract: Secondary | ICD-10-CM | POA: Diagnosis not present

## 2020-08-16 DIAGNOSIS — Z79811 Long term (current) use of aromatase inhibitors: Secondary | ICD-10-CM | POA: Diagnosis not present

## 2020-08-16 DIAGNOSIS — Z8041 Family history of malignant neoplasm of ovary: Secondary | ICD-10-CM | POA: Insufficient documentation

## 2020-08-16 DIAGNOSIS — R69 Illness, unspecified: Secondary | ICD-10-CM | POA: Diagnosis not present

## 2020-08-16 LAB — CBC WITH DIFFERENTIAL/PLATELET
Abs Immature Granulocytes: 0.04 10*3/uL (ref 0.00–0.07)
Basophils Absolute: 0 10*3/uL (ref 0.0–0.1)
Basophils Relative: 1 %
Eosinophils Absolute: 0.3 10*3/uL (ref 0.0–0.5)
Eosinophils Relative: 5 %
HCT: 35.6 % — ABNORMAL LOW (ref 36.0–46.0)
Hemoglobin: 11.5 g/dL — ABNORMAL LOW (ref 12.0–15.0)
Immature Granulocytes: 1 %
Lymphocytes Relative: 30 %
Lymphs Abs: 2 10*3/uL (ref 0.7–4.0)
MCH: 30.4 pg (ref 26.0–34.0)
MCHC: 32.3 g/dL (ref 30.0–36.0)
MCV: 94.2 fL (ref 80.0–100.0)
Monocytes Absolute: 0.5 10*3/uL (ref 0.1–1.0)
Monocytes Relative: 7 %
Neutro Abs: 3.8 10*3/uL (ref 1.7–7.7)
Neutrophils Relative %: 56 %
Platelets: 213 10*3/uL (ref 150–400)
RBC: 3.78 MIL/uL — ABNORMAL LOW (ref 3.87–5.11)
RDW: 14.8 % (ref 11.5–15.5)
WBC: 6.7 10*3/uL (ref 4.0–10.5)
nRBC: 0 % (ref 0.0–0.2)

## 2020-08-16 LAB — COMPREHENSIVE METABOLIC PANEL
ALT: 24 U/L (ref 0–44)
AST: 31 U/L (ref 15–41)
Albumin: 3.6 g/dL (ref 3.5–5.0)
Alkaline Phosphatase: 69 U/L (ref 38–126)
Anion gap: 8 (ref 5–15)
BUN: 13 mg/dL (ref 8–23)
CO2: 27 mmol/L (ref 22–32)
Calcium: 9.8 mg/dL (ref 8.9–10.3)
Chloride: 103 mmol/L (ref 98–111)
Creatinine, Ser: 1.21 mg/dL — ABNORMAL HIGH (ref 0.44–1.00)
GFR, Estimated: 48 mL/min — ABNORMAL LOW (ref >60)
Glucose, Bld: 106 mg/dL — ABNORMAL HIGH (ref 70–99)
Potassium: 4.8 mmol/L (ref 3.5–5.1)
Sodium: 138 mmol/L (ref 135–145)
Total Bilirubin: 0.3 mg/dL (ref 0.3–1.2)
Total Protein: 7.1 g/dL (ref 6.5–8.1)

## 2020-08-22 ENCOUNTER — Ambulatory Visit: Payer: Medicare HMO | Admitting: Physician Assistant

## 2020-08-22 ENCOUNTER — Other Ambulatory Visit: Payer: Self-pay

## 2020-08-22 ENCOUNTER — Encounter: Payer: Self-pay | Admitting: Physician Assistant

## 2020-08-22 VITALS — BP 160/76 | HR 77 | Ht 67.0 in | Wt 190.0 lb

## 2020-08-22 DIAGNOSIS — I6523 Occlusion and stenosis of bilateral carotid arteries: Secondary | ICD-10-CM | POA: Diagnosis not present

## 2020-08-22 DIAGNOSIS — I10 Essential (primary) hypertension: Secondary | ICD-10-CM

## 2020-08-22 DIAGNOSIS — I480 Paroxysmal atrial fibrillation: Secondary | ICD-10-CM

## 2020-08-22 DIAGNOSIS — E782 Mixed hyperlipidemia: Secondary | ICD-10-CM | POA: Diagnosis not present

## 2020-08-22 MED ORDER — NITROGLYCERIN 0.4 MG SL SUBL: 0.4000 mg | SUBLINGUAL_TABLET | SUBLINGUAL | 3 refills | Status: AC | PRN

## 2020-08-22 NOTE — Patient Instructions (Signed)
Medication Instructions:  Your physician recommends that you continue on your current medications as directed. Please refer to the Current Medication list given to you today.  *If you need a refill on your cardiac medications before your next appointment, please call your pharmacy*  Lab Work: None ordered today  Testing/Procedures: None ordered today  Follow-Up: At [facility], you and your health needs are our priority.  As part of our continuing mission to provide you with exceptional heart care, we have created designated Provider Care Teams.  These Care Teams include your primary Cardiologist (physician) and Advanced Practice Providers (APPs -  Physician Assistants and Nurse Practitioners) who all work together to provide you with the care you need, when you need it.  Your next appointment:   12 month(s)  The format for your next appointment:   In Person  Provider:   You may see [name], MD or Richardson Dopp, PA-C  Other Instructions Call the office if your blood pressure is 130/80 or higher consistently

## 2020-08-22 NOTE — Progress Notes (Signed)
Cardiology Office Note:    Date:  08/22/2020   ID:  Melanie, [name] 10/18/1945, MRN [id number]  PCP:  Marton Redwood, MD  [facility] Cardiologist:  [name], MD   [facility] Electrophysiologist:  None   Referring MD: Marton Redwood, MD   Chief Complaint:  Follow-up (AFib, HTN)    Patient Profile:    Melanie [name] is a 74 y.o. female with:   Paroxysmal atrial fibrillation  Diabetes mellitus   Hypertension   Hyperlipidemia   Depression   Breast CA  Hx of multiple back surgeries   Cath 2002: normal coronary arteries   Myoview 1/18: Inferior infarct with mild peri-infarct ischemia; low risk >> med Rx  Carotid artery disease  Ultrasound 10/2018: Bilateral ICA 1-39; R subclavian stenosis  Prior CV studies:   ABIs 04/18/2019 R 1.06; L 1.01  Echocardiogram 11/11/2018 EF 50-55, mild LVH, GR 1 DD, no RWMA, normal RVSF, mild aortic valve sclerosis  Carotid US 10/26/2018 Bilateral ICA 1-39; right subclavian stenosis  Event monitor 10/2018 NSR; 1 run NSVT x5 beats  Echo 01/05/2018 Moderate LVH, EF 50-55, normal wall motion, grade 1 diastolic dysfunction, mildly calcified aortic valve leaflets, MAC  Nuclear stress test 10/16/16 Low risk stress nuclear study with apical thinning and prior inferior basal infarct with mild peri-infarct ischemia; EF 45 with akinesis of the basal inferior wall; mild LVE.- Reviewed byDr. Jeni Salles felt to be low risk  Carotid US 07/17/14 BilateralICA < 40  Nuclear stress test 03/2006 EF 59, normal perfusion  Cardiac catheterization 04/23/2001 LAD mid irregularities Normal EF  Echo 06/06/1998 Conc LVH, normal LVEF  History of Present Illness:    Melanie [name] was last seen by Dr. Acie Fredrickson in December 2020.  She returns for follow-up   She is here alone.  Since last seen, she has done well. She has chronic shortness of breath.  This is unchanged.  She has not had chest pain, syncope, leg edema, orthopnea.  She  has not had R arm claudication or steal symptoms.   She has been under a lot of stress recently.  She just sold her home and lived there for 50 years.  She has been getting ready to move.     Past Medical History:  Diagnosis Date  . Arthritis   . Bradycardia   . Depression    under control  . DM (diabetes mellitus) ([place])    type 2  . Dyspnea    with some exertion  . Family history of anesthesia complication    mother-nausea and vomiting  . Family history of breast cancer   . Family history of ovarian cancer   . GERD (gastroesophageal reflux disease)    occasional  . Headache   . HTN (hypertension)   . Neuropathy    legs and feet  . Neuropathy    feet  . PAF (paroxysmal atrial fibrillation) (HCC) 10 years ago   no problems since, due to stress  . Situational stress    "was taking care of sick husband"    Current Medications: Current Meds  Medication Sig  . Alirocumab (PRALUENT) 75 MG/ML SOAJ Inject 1 pen into the skin every 14 (fourteen) days.  Marland Kitchen amLODipine (NORVASC) 2.5 MG tablet Take 1 tablet (2.5 mg total) by mouth daily.  Marland Kitchen anastrozole (ARIMIDEX) 1 MG tablet Take 1 tablet (1 mg total) by mouth daily.  Marland Kitchen aspirin EC 81 MG tablet Take 81 mg by mouth daily.   . bisoprolol (ZEBETA) 5  MG tablet Take 1 tablet (5 mg total) by mouth daily.  . Cyanocobalamin (VITAMIN B-12 IJ) Inject 1,000 Units as directed every 30 (thirty) days.  Marland Kitchen gabapentin (NEURONTIN) 300 MG capsule Take 3 capsules (900 mg total) by mouth 3 (three) times daily.  Marland Kitchen glipiZIDE (GLUCOTROL) 5 MG tablet Take 5 mg by mouth 2 (two) times daily before a meal.   . hydrochlorothiazide (MICROZIDE) 12.5 MG capsule Take 1 capsule (12.5 mg total) by mouth daily.  Marland Kitchen HYDROcodone-acetaminophen (NORCO/VICODIN) 5-325 MG tablet Take 1-2 tablets by mouth every 6 (six) hours as needed for moderate pain.  Marland Kitchen lisinopril (ZESTRIL) 20 MG tablet Take 1 tablet (20 mg total) by mouth daily.  . metFORMIN (GLUCOPHAGE) 1000 MG tablet Take  1,000 mg by mouth 2 (two) times daily.  . nitroGLYCERIN (NITROSTAT) 0.4 MG SL tablet Place 1 tablet (0.4 mg total) under the tongue every 5 (five) minutes as needed.  . nortriptyline (PAMELOR) 10 MG capsule TAKE 4 CAPSULES BY MOUTH AT BEDTIME  . Omega-3 Fatty Acids (FISH OIL PO) Take 2,000 Units by mouth 2 (two) times daily.  . sertraline (ZOLOFT) 50 MG tablet Take 50 mg by mouth at bedtime.   . [DISCONTINUED] nitroGLYCERIN (NITROSTAT) 0.4 MG SL tablet Place 1 tablet (0.4 mg total) under the tongue every 5 (five) minutes as needed.     Allergies:   Fenofibrate, Rosuvastatin, and Metoprolol   Social History   Tobacco Use  . Smoking status: Current Every Day Smoker    Packs/day: 0.50    Years: 30.00    Pack years: 15.00    Types: Cigarettes  . Smokeless tobacco: Never Used  Vaping Use  . Vaping Use: Never used  Substance Use Topics  . Alcohol use: No    Alcohol/week: 0.0 standard drinks  . Drug use: No     Family Hx: The patient's family history includes Breast cancer in her sister; Cancer in her maternal uncle and maternal uncle; Cancer (age of onset: 70) in her cousin; Heart attack in her father; Hypertension in her mother and sister; Multiple sclerosis in her sister; Neuropathy in her mother and sister; Other in her mother; Ovarian cancer in her sister; Stomach cancer in her paternal uncle; Stroke in her sister.  ROS   EKGs/Labs/Other Test Reviewed:    EKG:  EKG is  ordered today.  The ekg ordered today demonstrates normal sinus rhythm, heart rate 77, normal axis, nonspecific ST-T wave changes, QTC 434  Recent Labs: 08/16/2020: ALT 24; BUN 13; Creatinine, Ser 1.21; Hemoglobin 11.5; Platelets 213; Potassium 4.8; Sodium 138   Recent Lipid Panel Lab Results  Component Value Date/Time   CHOL 94 (L) 08/19/2019 08:05 AM   TRIG 222 (H) 08/19/2019 08:05 AM   HDL 35 (L) 08/19/2019 08:05 AM   CHOLHDL 2.7 08/19/2019 08:05 AM   LDLCALC 25 08/19/2019 08:05 AM   Labs obtained  through [facility] - personally reviewed and interpreted: 10/19/2019: Total cholesterol 149, HDL 37, LDL 59, triglycerides 263   Risk Assessment/Calculations:     CHA2DS2-VASc Score = 4  This indicates a 4.8% annual risk of stroke. The patient's score is based upon: CHF History: 0 HTN History: 1 Diabetes History: 1 Stroke History: 0 Vascular Disease History: 0 Age Score: 1 Gender Score: 1     Physical Exam:    VS:  BP (!) 160/76   Pulse 77   Ht _0  (1.702 m)   Wt 190 lb (86.2 kg)   SpO2 97%  BMI 29.76 kg/m     Wt Readings from Last 3 Encounters:  08/22/20 190 lb (86.2 kg)  08/16/20 194 lb (88 kg)  07/09/20 191 lb (86.6 kg)     Constitutional:      Appearance: Healthy appearance. Not in distress.  Neck:     Vascular: No JVR.  Pulmonary:     Effort: Pulmonary effort is normal.     Breath sounds: No wheezing. No rales.  Cardiovascular:     Normal rate. Regular rhythm. Normal S1. Normal S2.     Murmurs: There is no murmur.  Edema:    Peripheral edema absent.  Abdominal:     Palpations: Abdomen is soft.  Skin:    General: Skin is warm and dry.  Neurological:     Mental Status: Alert and oriented to person, place and time.     Cranial Nerves: Cranial nerves are intact.       ASSESSMENT & PLAN:    1. PAF (paroxysmal atrial fibrillation) (HCC) Remote history of atrial fibrillation.  There has not been a documented recurrence.  CHA2DS2-VASc Score = 4 [CHF History: 0, HTN History: 1, Diabetes History: 1, Stroke History: 0, Vascular Disease History: 0, Age Score: 1, Gender Score: 1].  Therefore, the patient's annual risk of stroke is 4.8 %.  If she has recurrent atrial fibrillation, she will need anticoagulation.  2. Essential hypertension Blood pressure elevated today.  She has been under some stress with moving.  Her blood pressures at home are usually 120s/70s.  I have asked her to continue to monitor blood pressure at home and notify us if her pressure is 130/80  or higher.  3. Mixed hyperlipidemia LDL optimal.  Continue Alirocumab.  Triglycerides elevated.  I encouraged her to reduce simple carbohydrates to help improve her triglyceride level.  4. Bilateral carotid artery stenosis Minimal bilateral plaque by carotid ultrasound and February 2020.  Continue aspirin, PCSK9 inhibitor.  She did have right subclavian stenosis on her carotid Doppler..  However, she does not have any symptoms of right arm claudication or steal.    Dispo:  Return in about 1 year (around 08/22/2021) for Routine Follow Up, w/ Dr. Acie Fredrickson, or Richardson Dopp, PA-C, in person.   Medication Adjustments/Labs and Tests Ordered: Current medicines are reviewed at length with the patient today.  Concerns regarding medicines are outlined above.  Tests Ordered: Orders Placed This Encounter  Procedures  . EKG 12-Lead   Medication Changes: Meds ordered this encounter  Medications  . nitroGLYCERIN (NITROSTAT) 0.4 MG SL tablet    Sig: Place 1 tablet (0.4 mg total) under the tongue every 5 (five) minutes as needed.    Dispense:  25 tablet    Refill:  3    Signed, Richardson Dopp, PA-C  08/22/2020 3:53 PM    [facility] [place], [place], [place]  [address] Phone: [phone]; Fax: [phone]

## 2020-08-24 ENCOUNTER — Ambulatory Visit
Admission: RE | Admit: 2020-08-24 | Discharge: 2020-08-24 | Disposition: A | Discharge status: Home / Self Care | Payer: Medicare HMO | Source: Ambulatory Visit | Attending: Internal Medicine | Admitting: Internal Medicine

## 2020-08-24 ENCOUNTER — Other Ambulatory Visit: Payer: Self-pay

## 2020-08-24 DIAGNOSIS — R928 Other abnormal and inconclusive findings on diagnostic imaging of breast: Secondary | ICD-10-CM

## 2020-08-24 DIAGNOSIS — N6001 Solitary cyst of right breast: Secondary | ICD-10-CM | POA: Diagnosis not present

## 2020-09-10 ENCOUNTER — Other Ambulatory Visit: Payer: Self-pay | Admitting: Cardiovascular Disease

## 2020-09-13 ENCOUNTER — Other Ambulatory Visit: Payer: Self-pay | Admitting: *Deleted

## 2020-09-13 NOTE — Patient Outreach (Signed)
[facility] Care Management  09/13/2020  [name] 08-May-1946 [id number]  Unsuccessful outreach attempt made to patient. Patient answered the phone a stated she would not be able to speak today. She requested a callback at a later date.   Plan: RN Health Coach will call patient within the month of January.  Blanchie Serve RN, BSN [facility] Care Management  RN Health Coach [phone] [email or web address]

## 2020-09-26 ENCOUNTER — Other Ambulatory Visit: Payer: Self-pay | Admitting: Neurology

## 2020-09-27 ENCOUNTER — Telehealth: Payer: Self-pay | Admitting: Neurology

## 2020-09-27 MED ORDER — GABAPENTIN 300 MG PO CAPS
ORAL_CAPSULE | ORAL | 3 refills | Status: DC
Start: 2020-09-27 — End: 2021-05-22

## 2020-09-27 MED ORDER — NORTRIPTYLINE HCL 10 MG PO CAPS
ORAL_CAPSULE | ORAL | 3 refills | Status: DC
Start: 2020-09-27 — End: 2021-10-10

## 2020-09-27 NOTE — Telephone Encounter (Signed)
Left message informing patient that rx has been sent to the pharmacy.

## 2020-09-27 NOTE — Telephone Encounter (Signed)
Prescriptions resent as requested.  Please notify pt. Thanks.

## 2020-09-27 NOTE — Telephone Encounter (Signed)
Patient left message with accessnurse 09/26/20 @ 4:35PM: "Caller needs a refill on her medication"

## 2020-09-27 NOTE — Telephone Encounter (Signed)
Patient moved at the end of Dec and some of her medication has been misplaced and she can not find it and needs [place] to call in a refill on the Nortriptyline and the gabapentin  To the [facility]   She is about out of medication

## 2020-10-15 DIAGNOSIS — Z4789 Encounter for other orthopedic aftercare: Secondary | ICD-10-CM | POA: Diagnosis not present

## 2020-10-16 ENCOUNTER — Other Ambulatory Visit: Payer: Self-pay | Admitting: *Deleted

## 2020-10-16 NOTE — Patient Outreach (Addendum)
[place] [facility]) Care Management  10/16/2020  ROSHA COCKER 03/25/1946 [id number]  Unsuccessful outreach attempt made to patient. RN Health Coach left HIPAA compliant voicemail message along with her contact information.  Plan: RN Health Coach will call patient within the month of March.  Emelia Loron RN, BSN [place] [phone] [email or web address]

## 2020-10-22 DIAGNOSIS — E538 Deficiency of other specified B group vitamins: Secondary | ICD-10-CM | POA: Diagnosis not present

## 2020-10-24 DIAGNOSIS — N1831 Chronic kidney disease, stage 3a: Secondary | ICD-10-CM | POA: Diagnosis not present

## 2020-10-24 DIAGNOSIS — E785 Hyperlipidemia, unspecified: Secondary | ICD-10-CM | POA: Diagnosis not present

## 2020-10-24 DIAGNOSIS — E1129 Type 2 diabetes mellitus with other diabetic kidney complication: Secondary | ICD-10-CM | POA: Diagnosis not present

## 2020-10-29 DIAGNOSIS — Z1331 Encounter for screening for depression: Secondary | ICD-10-CM | POA: Diagnosis not present

## 2020-10-29 DIAGNOSIS — E785 Hyperlipidemia, unspecified: Secondary | ICD-10-CM | POA: Diagnosis not present

## 2020-10-29 DIAGNOSIS — E781 Pure hyperglyceridemia: Secondary | ICD-10-CM | POA: Diagnosis not present

## 2020-10-29 DIAGNOSIS — E1149 Type 2 diabetes mellitus with other diabetic neurological complication: Secondary | ICD-10-CM | POA: Diagnosis not present

## 2020-10-29 DIAGNOSIS — E538 Deficiency of other specified B group vitamins: Secondary | ICD-10-CM | POA: Diagnosis not present

## 2020-10-29 DIAGNOSIS — G720 Drug-induced myopathy: Secondary | ICD-10-CM | POA: Diagnosis not present

## 2020-10-29 DIAGNOSIS — I10 Essential (primary) hypertension: Secondary | ICD-10-CM | POA: Diagnosis not present

## 2020-10-29 DIAGNOSIS — M8580 Other specified disorders of bone density and structure, unspecified site: Secondary | ICD-10-CM | POA: Diagnosis not present

## 2020-10-29 DIAGNOSIS — N1831 Chronic kidney disease, stage 3a: Secondary | ICD-10-CM | POA: Diagnosis not present

## 2020-10-29 DIAGNOSIS — E1129 Type 2 diabetes mellitus with other diabetic kidney complication: Secondary | ICD-10-CM | POA: Diagnosis not present

## 2020-10-29 DIAGNOSIS — Z1339 Encounter for screening examination for other mental health and behavioral disorders: Secondary | ICD-10-CM | POA: Diagnosis not present

## 2020-10-29 DIAGNOSIS — I6529 Occlusion and stenosis of unspecified carotid artery: Secondary | ICD-10-CM | POA: Diagnosis not present

## 2020-10-29 DIAGNOSIS — Z1212 Encounter for screening for malignant neoplasm of rectum: Secondary | ICD-10-CM | POA: Diagnosis not present

## 2020-10-29 DIAGNOSIS — I129 Hypertensive chronic kidney disease with stage 1 through stage 4 chronic kidney disease, or unspecified chronic kidney disease: Secondary | ICD-10-CM | POA: Diagnosis not present

## 2020-10-29 DIAGNOSIS — R82998 Other abnormal findings in urine: Secondary | ICD-10-CM | POA: Diagnosis not present

## 2020-10-29 DIAGNOSIS — Z0000 Encounter for general adult medical examination without abnormal findings: Secondary | ICD-10-CM | POA: Diagnosis not present

## 2020-10-30 ENCOUNTER — Other Ambulatory Visit: Payer: Self-pay | Admitting: Internal Medicine

## 2020-10-30 DIAGNOSIS — Z720 Tobacco use: Secondary | ICD-10-CM

## 2020-11-01 DIAGNOSIS — H5202 Hypermetropia, left eye: Secondary | ICD-10-CM | POA: Diagnosis not present

## 2020-11-01 DIAGNOSIS — H2513 Age-related nuclear cataract, bilateral: Secondary | ICD-10-CM | POA: Diagnosis not present

## 2020-11-01 DIAGNOSIS — H25013 Cortical age-related cataract, bilateral: Secondary | ICD-10-CM | POA: Diagnosis not present

## 2020-11-01 DIAGNOSIS — E119 Type 2 diabetes mellitus without complications: Secondary | ICD-10-CM | POA: Diagnosis not present

## 2020-11-14 DIAGNOSIS — M542 Cervicalgia: Secondary | ICD-10-CM | POA: Diagnosis not present

## 2020-11-14 DIAGNOSIS — M25511 Pain in right shoulder: Secondary | ICD-10-CM | POA: Diagnosis not present

## 2020-11-16 ENCOUNTER — Other Ambulatory Visit: Payer: Self-pay | Admitting: *Deleted

## 2020-11-16 NOTE — Patient Outreach (Signed)
[facility]) Care Management  11/16/2020  Melanie [name] 21-Mar-1946 [id number]  Unsuccessful outreach attempt made to patient. Patient answered the phone and stated that she would not be able to speak today. She did request that this nurse call back at a later date.   Plan: RN Health Coach will call patient within the month of April.  Emelia Loron RN, BSN [place] [phone] [email or web address]

## 2020-11-19 ENCOUNTER — Ambulatory Visit
Admission: RE | Admit: 2020-11-19 | Discharge: 2020-11-19 | Disposition: A | Discharge status: Home / Self Care | Payer: Medicare HMO | Source: Ambulatory Visit | Attending: Internal Medicine | Admitting: Internal Medicine

## 2020-11-19 DIAGNOSIS — R69 Illness, unspecified: Secondary | ICD-10-CM | POA: Diagnosis not present

## 2020-11-19 DIAGNOSIS — Z720 Tobacco use: Secondary | ICD-10-CM

## 2020-11-26 DIAGNOSIS — I129 Hypertensive chronic kidney disease with stage 1 through stage 4 chronic kidney disease, or unspecified chronic kidney disease: Secondary | ICD-10-CM | POA: Diagnosis not present

## 2020-11-26 DIAGNOSIS — R918 Other nonspecific abnormal finding of lung field: Secondary | ICD-10-CM | POA: Diagnosis not present

## 2020-11-26 DIAGNOSIS — N1831 Chronic kidney disease, stage 3a: Secondary | ICD-10-CM | POA: Diagnosis not present

## 2020-11-26 DIAGNOSIS — E1129 Type 2 diabetes mellitus with other diabetic kidney complication: Secondary | ICD-10-CM | POA: Diagnosis not present

## 2020-11-29 ENCOUNTER — Other Ambulatory Visit: Payer: Self-pay | Admitting: Cardiovascular Disease

## 2020-12-04 DIAGNOSIS — D6869 Other thrombophilia: Secondary | ICD-10-CM | POA: Diagnosis not present

## 2020-12-04 DIAGNOSIS — Z794 Long term (current) use of insulin: Secondary | ICD-10-CM | POA: Diagnosis not present

## 2020-12-04 DIAGNOSIS — R69 Illness, unspecified: Secondary | ICD-10-CM | POA: Diagnosis not present

## 2020-12-04 DIAGNOSIS — Z008 Encounter for other general examination: Secondary | ICD-10-CM | POA: Diagnosis not present

## 2020-12-04 DIAGNOSIS — I4891 Unspecified atrial fibrillation: Secondary | ICD-10-CM | POA: Diagnosis not present

## 2020-12-04 DIAGNOSIS — E1165 Type 2 diabetes mellitus with hyperglycemia: Secondary | ICD-10-CM | POA: Diagnosis not present

## 2020-12-04 DIAGNOSIS — E1142 Type 2 diabetes mellitus with diabetic polyneuropathy: Secondary | ICD-10-CM | POA: Diagnosis not present

## 2020-12-04 DIAGNOSIS — E1136 Type 2 diabetes mellitus with diabetic cataract: Secondary | ICD-10-CM | POA: Diagnosis not present

## 2020-12-04 DIAGNOSIS — E785 Hyperlipidemia, unspecified: Secondary | ICD-10-CM | POA: Diagnosis not present

## 2020-12-10 DIAGNOSIS — I10 Essential (primary) hypertension: Secondary | ICD-10-CM | POA: Diagnosis not present

## 2020-12-12 DIAGNOSIS — I10 Essential (primary) hypertension: Secondary | ICD-10-CM | POA: Diagnosis not present

## 2020-12-27 DIAGNOSIS — I10 Essential (primary) hypertension: Secondary | ICD-10-CM | POA: Diagnosis not present

## 2021-01-03 ENCOUNTER — Other Ambulatory Visit: Payer: Self-pay | Admitting: *Deleted

## 2021-01-03 NOTE — Patient Outreach (Signed)
[facility]) Care Management  01/03/2021  Melanie [name] May 10, 1946 [id number]  Unsuccessful outreach attempt made to patient. RN Health Coach left HIPAA compliant voicemail message along with her contact information.  Plan: RN Health Coach will call patient within the month of May and will send an unsuccessful letter.  Emelia Loron RN, BSN [place] [phone] [name]

## 2021-02-07 ENCOUNTER — Other Ambulatory Visit: Payer: Self-pay | Admitting: *Deleted

## 2021-02-07 NOTE — Patient Outreach (Signed)
[place] [facility]) Care Management  02/07/2021  KAMRI GOTSCH 08-09-1946 [id number]  Unsuccessful outreach attempt made to patient. RN Health Coach left HIPAA compliant voicemail message along with her contact information.  Plan: RN Health Coach will call patient within the month of June.  Emelia Loron RN, BSN [place] [phone] [email or web address]

## 2021-03-09 IMAGING — MG DIGITAL SCREENING BILAT W/ TOMO W/ CAD
8 series · 9 of 24 positions shown · non-contrast
Comparison: Previous exam(s).

CLINICAL DATA: Screening.

EXAM:
DIGITAL SCREENING BILATERAL MAMMOGRAM WITH TOMO AND CAD

[R CC synth-2D]
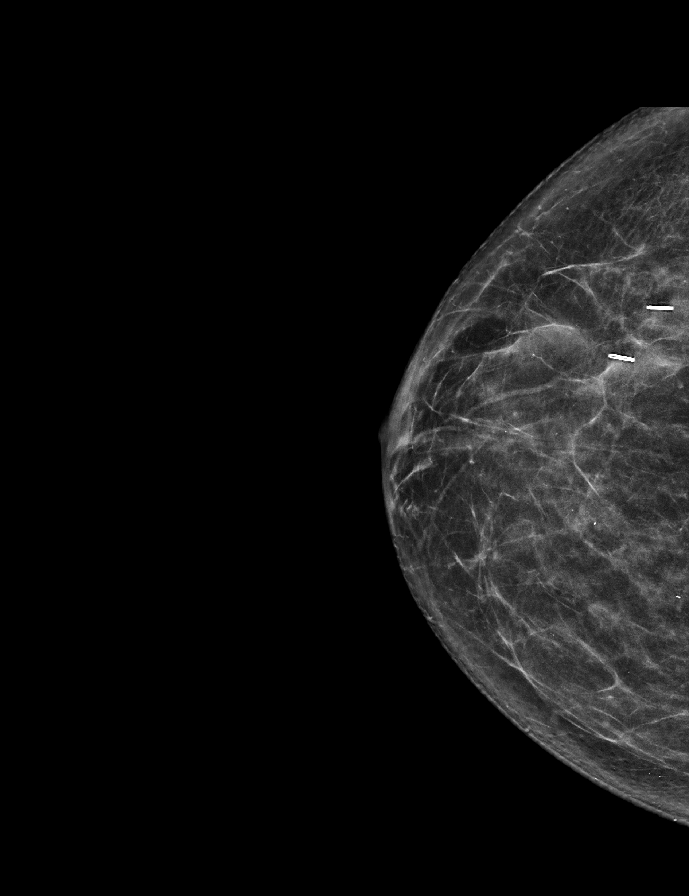

[L CC synth-2D]
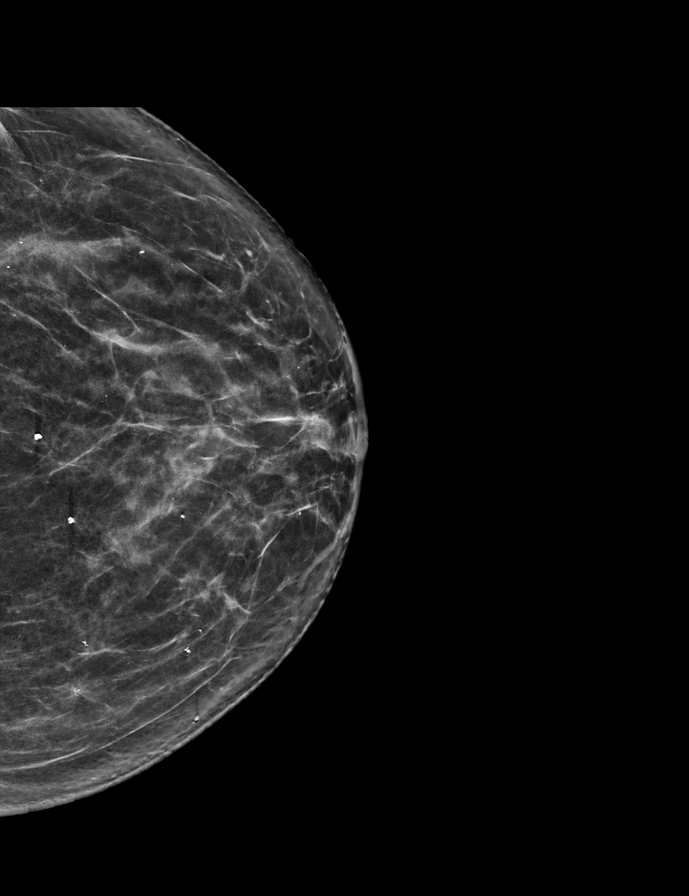

[R MLO synth-2D]
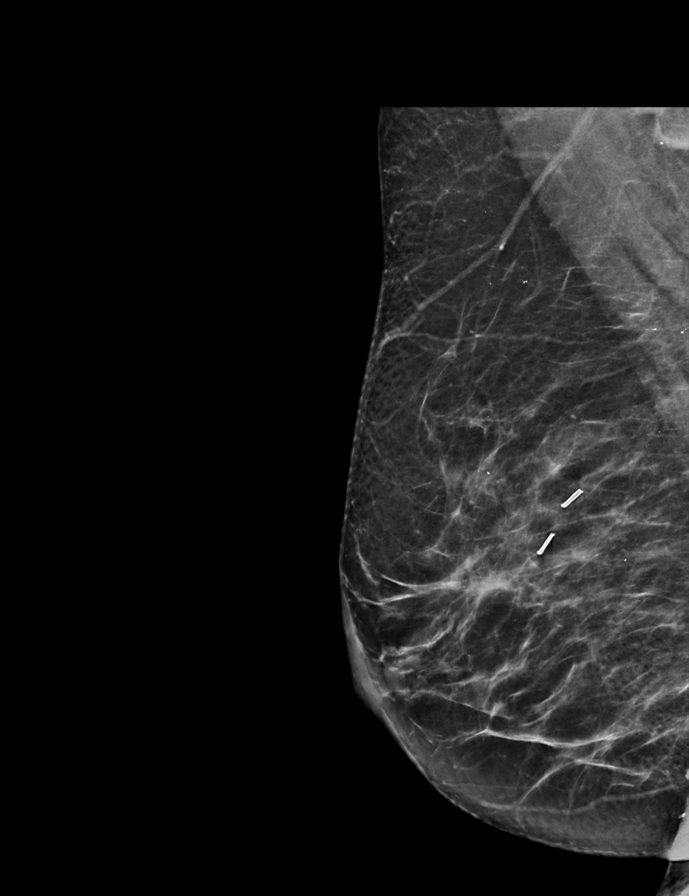

[L MLO synth-2D]
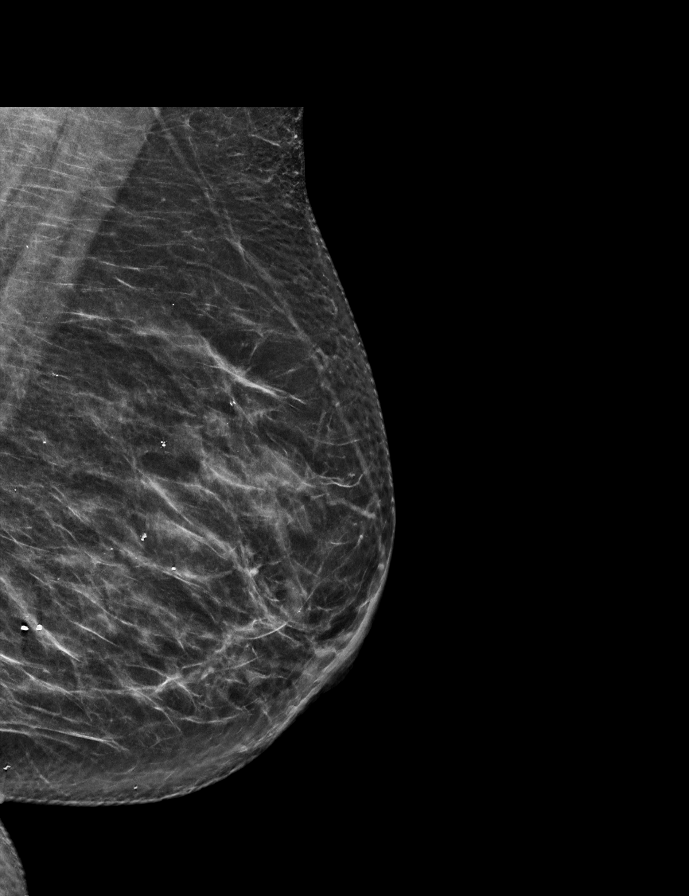

[R MLO tomo · 2 of 67 frames shown]
[frame 22/67]
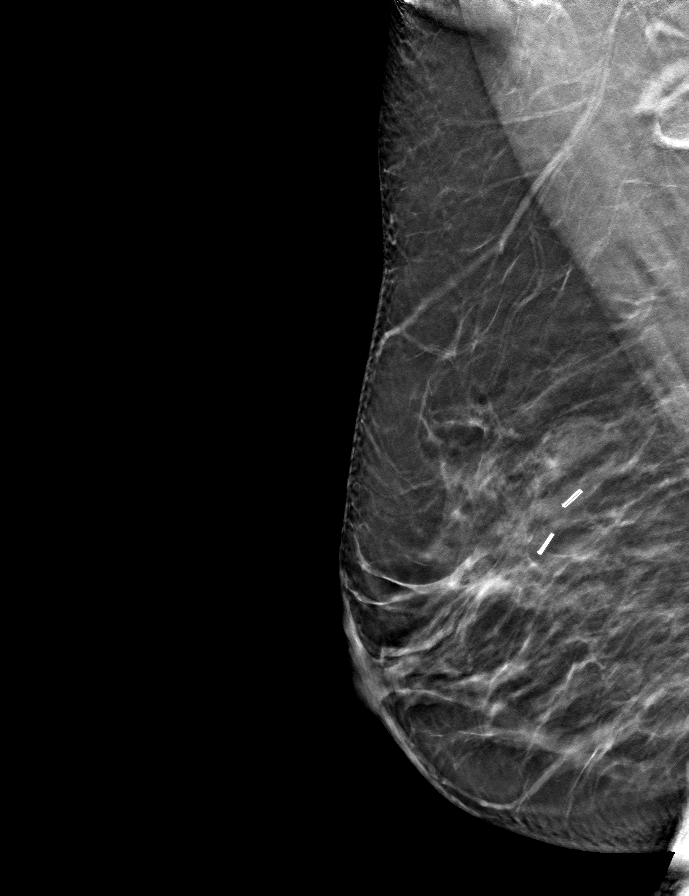
[frame 34/67]
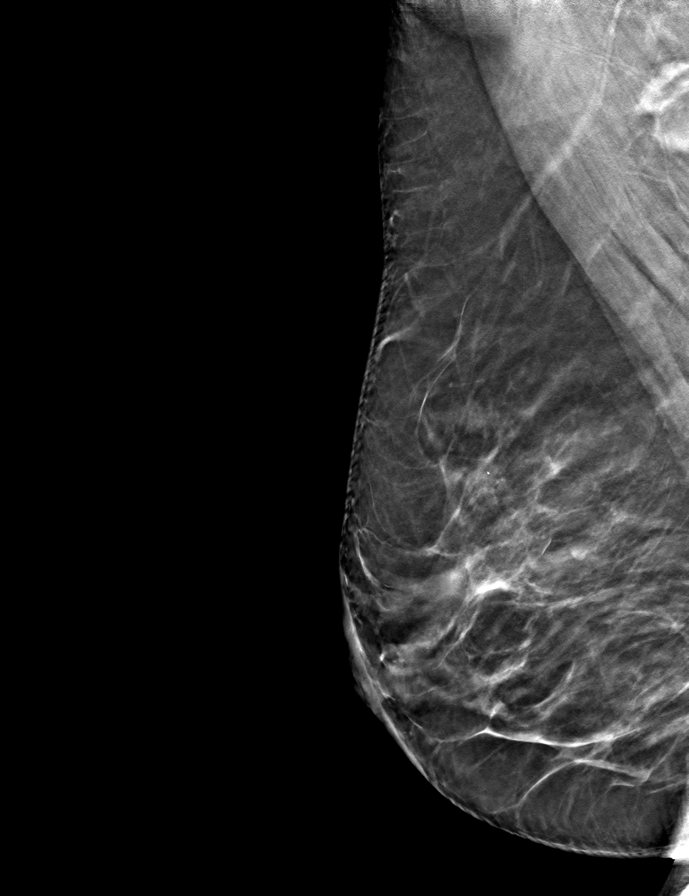

[R CC tomo · tomo slice 30/59.0]
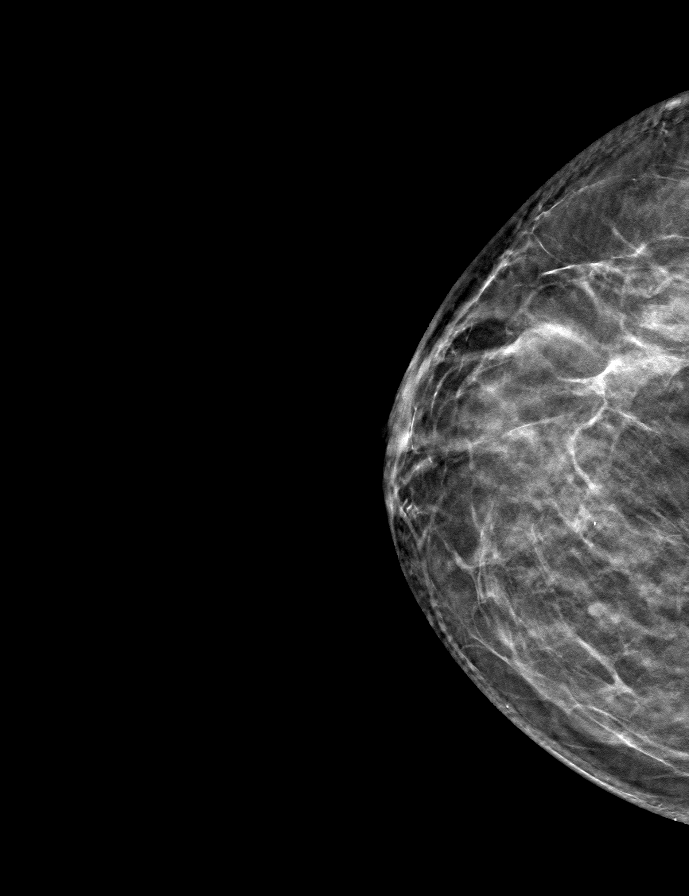

[L CC tomo · tomo slice 32/63.0]
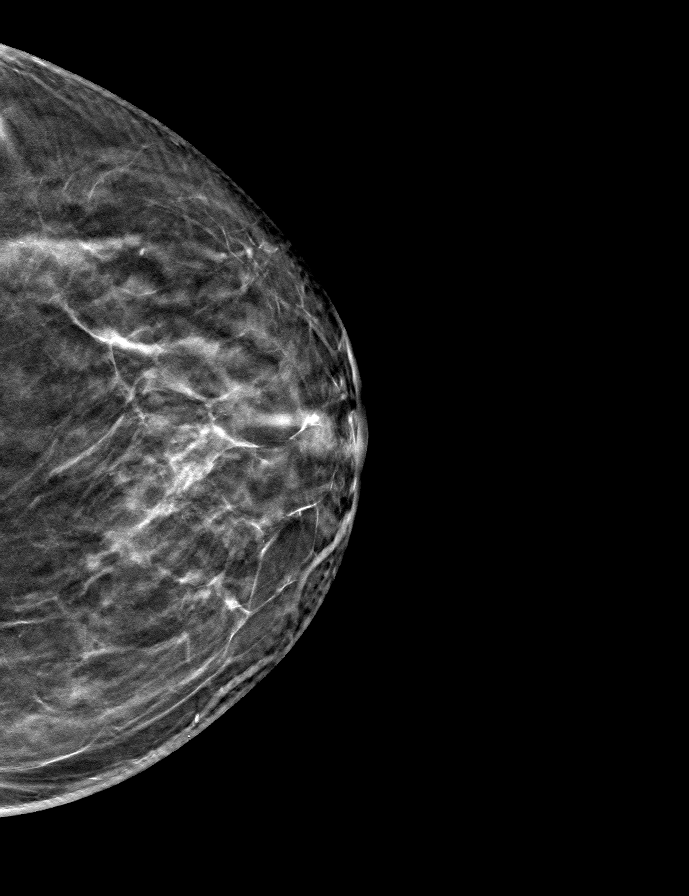

[L MLO tomo · tomo slice 35/69.0]
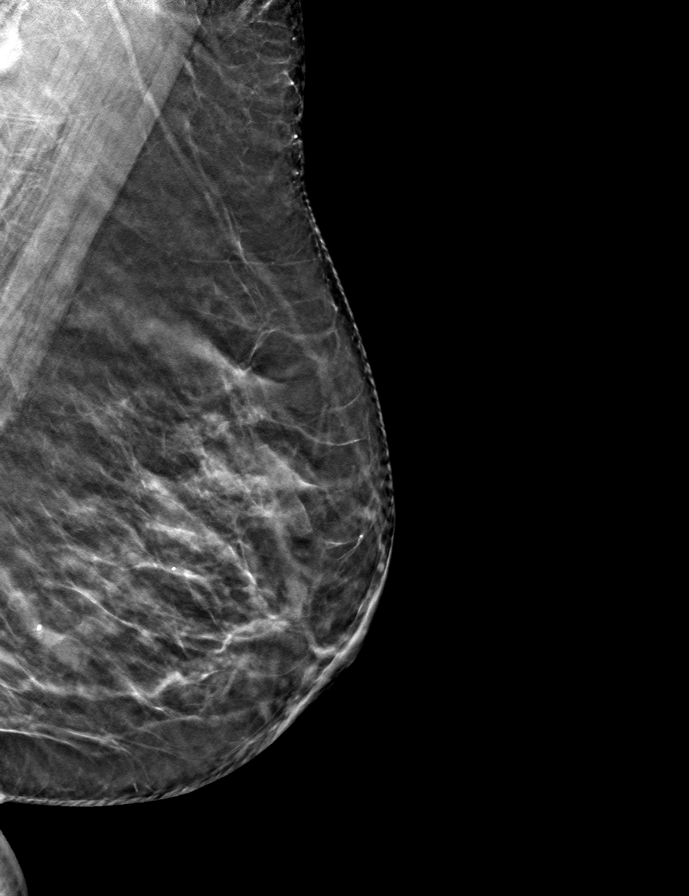

[9 of 24 positions shown; findings below may reference images not displayed]

ACR Breast Density Category c: The breast tissue is heterogeneously
dense, which may obscure small masses.
FINDINGS: In the right breast, a possible mass warrants further evaluation. In
the left breast, no findings suspicious for malignancy. Images were
processed with CAD.
IMPRESSION: Further evaluation is suggested for a possible mass in the right
breast.

RECOMMENDATION:
Diagnostic mammogram and possibly ultrasound of the right breast.
(Code:[id number])

The patient will be contacted regarding the findings, and additional
imaging will be scheduled.

BI-RADS CATEGORY  0: Incomplete. Need additional imaging evaluation
and/or prior mammograms for comparison.

## 2021-03-12 ENCOUNTER — Other Ambulatory Visit: Payer: Self-pay | Admitting: *Deleted

## 2021-03-12 DIAGNOSIS — Z1211 Encounter for screening for malignant neoplasm of colon: Secondary | ICD-10-CM | POA: Diagnosis not present

## 2021-03-12 DIAGNOSIS — Z1212 Encounter for screening for malignant neoplasm of rectum: Secondary | ICD-10-CM | POA: Diagnosis not present

## 2021-03-12 NOTE — Patient Outreach (Signed)
[facility]) Care Management  03/12/2021  GORGEOUS NEWLUN 1946-06-23 [id number]  Unsuccessful outreach attempt made to patient. RN Health Coach left HIPAA compliant voicemail message along with her contact information.  Plan: RN Health Coach will call patient within the month of August.  Emelia Loron RN, BSN [place] [phone] [email or web address] [email or web address]

## 2021-03-14 DIAGNOSIS — N1831 Chronic kidney disease, stage 3a: Secondary | ICD-10-CM | POA: Diagnosis not present

## 2021-03-14 DIAGNOSIS — E1122 Type 2 diabetes mellitus with diabetic chronic kidney disease: Secondary | ICD-10-CM | POA: Diagnosis not present

## 2021-03-14 DIAGNOSIS — E785 Hyperlipidemia, unspecified: Secondary | ICD-10-CM | POA: Diagnosis not present

## 2021-03-14 DIAGNOSIS — I129 Hypertensive chronic kidney disease with stage 1 through stage 4 chronic kidney disease, or unspecified chronic kidney disease: Secondary | ICD-10-CM | POA: Diagnosis not present

## 2021-04-11 DIAGNOSIS — Z20822 Contact with and (suspected) exposure to covid-19: Secondary | ICD-10-CM | POA: Diagnosis not present

## 2021-04-11 DIAGNOSIS — U071 COVID-19: Secondary | ICD-10-CM | POA: Diagnosis not present

## 2021-04-14 DIAGNOSIS — E785 Hyperlipidemia, unspecified: Secondary | ICD-10-CM | POA: Diagnosis not present

## 2021-04-14 DIAGNOSIS — N1831 Chronic kidney disease, stage 3a: Secondary | ICD-10-CM | POA: Diagnosis not present

## 2021-04-14 DIAGNOSIS — E1122 Type 2 diabetes mellitus with diabetic chronic kidney disease: Secondary | ICD-10-CM | POA: Diagnosis not present

## 2021-04-14 DIAGNOSIS — I10 Essential (primary) hypertension: Secondary | ICD-10-CM | POA: Diagnosis not present

## 2021-04-14 DIAGNOSIS — I129 Hypertensive chronic kidney disease with stage 1 through stage 4 chronic kidney disease, or unspecified chronic kidney disease: Secondary | ICD-10-CM | POA: Diagnosis not present

## 2021-04-21 DIAGNOSIS — R059 Cough, unspecified: Secondary | ICD-10-CM | POA: Diagnosis not present

## 2021-04-21 DIAGNOSIS — Z20822 Contact with and (suspected) exposure to covid-19: Secondary | ICD-10-CM | POA: Diagnosis not present

## 2021-05-01 DIAGNOSIS — N1831 Chronic kidney disease, stage 3a: Secondary | ICD-10-CM | POA: Diagnosis not present

## 2021-05-01 DIAGNOSIS — E785 Hyperlipidemia, unspecified: Secondary | ICD-10-CM | POA: Diagnosis not present

## 2021-05-01 DIAGNOSIS — E1149 Type 2 diabetes mellitus with other diabetic neurological complication: Secondary | ICD-10-CM | POA: Diagnosis not present

## 2021-05-01 DIAGNOSIS — E1129 Type 2 diabetes mellitus with other diabetic kidney complication: Secondary | ICD-10-CM | POA: Diagnosis not present

## 2021-05-01 DIAGNOSIS — E871 Hypo-osmolality and hyponatremia: Secondary | ICD-10-CM | POA: Diagnosis not present

## 2021-05-01 DIAGNOSIS — G720 Drug-induced myopathy: Secondary | ICD-10-CM | POA: Diagnosis not present

## 2021-05-01 DIAGNOSIS — E538 Deficiency of other specified B group vitamins: Secondary | ICD-10-CM | POA: Diagnosis not present

## 2021-05-01 DIAGNOSIS — I480 Paroxysmal atrial fibrillation: Secondary | ICD-10-CM | POA: Diagnosis not present

## 2021-05-01 DIAGNOSIS — Z794 Long term (current) use of insulin: Secondary | ICD-10-CM | POA: Diagnosis not present

## 2021-05-01 DIAGNOSIS — I209 Angina pectoris, unspecified: Secondary | ICD-10-CM | POA: Diagnosis not present

## 2021-05-01 DIAGNOSIS — I129 Hypertensive chronic kidney disease with stage 1 through stage 4 chronic kidney disease, or unspecified chronic kidney disease: Secondary | ICD-10-CM | POA: Diagnosis not present

## 2021-05-15 ENCOUNTER — Telehealth: Payer: Self-pay | Admitting: Cardiovascular Disease

## 2021-05-15 ENCOUNTER — Other Ambulatory Visit: Payer: Self-pay | Admitting: *Deleted

## 2021-05-15 DIAGNOSIS — I129 Hypertensive chronic kidney disease with stage 1 through stage 4 chronic kidney disease, or unspecified chronic kidney disease: Secondary | ICD-10-CM | POA: Diagnosis not present

## 2021-05-15 DIAGNOSIS — N1831 Chronic kidney disease, stage 3a: Secondary | ICD-10-CM | POA: Diagnosis not present

## 2021-05-15 DIAGNOSIS — E1122 Type 2 diabetes mellitus with diabetic chronic kidney disease: Secondary | ICD-10-CM | POA: Diagnosis not present

## 2021-05-15 DIAGNOSIS — E785 Hyperlipidemia, unspecified: Secondary | ICD-10-CM | POA: Diagnosis not present

## 2021-05-15 MED ORDER — PRALUENT 75 MG/ML ~~LOC~~ SOAJ: 1.0000 "pen " | SUBCUTANEOUS | 3 refills | Status: DC

## 2021-05-15 NOTE — Telephone Encounter (Signed)
refilled 

## 2021-05-15 NOTE — Telephone Encounter (Signed)
*  STAT* If patient is at the pharmacy, call can be transferred to refill team.   1. Which medications need to be refilled? (please list name of each medication and dose if known)  Alirocumab (PRALUENT) 75 MG/ML SOAJ  2. Which pharmacy/location (including street and city if local pharmacy) is medication to be sent to? [facility] [address] [place], [place] [address]  3. Do they need a 30 day or 90 day supply?  90 day supply

## 2021-05-15 NOTE — Patient Outreach (Signed)
[facility]) Care Management  05/15/2021  Melanie [name] July 20, 1946 [id number]  Unsuccessful outreach attempt made to patient. RN Health Coach left HIPAA compliant voicemail message along with her contact information.  Plan: RN Health Coach will call patient within the month of October and will send an unsuccessful letter.   Melanie Loron RN, BSN [place] [phone] [name]

## 2021-05-21 ENCOUNTER — Telehealth: Payer: Self-pay | Admitting: Neurology

## 2021-05-21 NOTE — Telephone Encounter (Signed)
Pt called in stating she needs a refill of her gabapentin. She stated the pharmacy was already supposed to send a request, but it has been a while. She has already reduced her dosage so she does not run out. She will be out soon. Please send a refill to the [facility] mail order pharmacy. She would like for someone to give her a call once it is sent so she will be able to call [facility] to pay for expedited shipping.

## 2021-05-22 MED ORDER — GABAPENTIN 300 MG PO CAPS: ORAL_CAPSULE | ORAL | 0 refills | Status: DC

## 2021-05-22 NOTE — Telephone Encounter (Signed)
Refill sent to [facility] for 90 days. Called patient and was unable to leave a message due to no voicemail.

## 2021-05-22 NOTE — Telephone Encounter (Signed)
Called patient and left a detailed message per DPR that her prescription was sent to [facility]. Let patient know to give [place] a call back if there are any questions or concerns.

## 2021-05-25 DIAGNOSIS — G629 Polyneuropathy, unspecified: Secondary | ICD-10-CM | POA: Diagnosis not present

## 2021-06-14 DIAGNOSIS — E785 Hyperlipidemia, unspecified: Secondary | ICD-10-CM | POA: Diagnosis not present

## 2021-06-14 DIAGNOSIS — E1122 Type 2 diabetes mellitus with diabetic chronic kidney disease: Secondary | ICD-10-CM | POA: Diagnosis not present

## 2021-06-14 DIAGNOSIS — N1831 Chronic kidney disease, stage 3a: Secondary | ICD-10-CM | POA: Diagnosis not present

## 2021-06-14 DIAGNOSIS — I129 Hypertensive chronic kidney disease with stage 1 through stage 4 chronic kidney disease, or unspecified chronic kidney disease: Secondary | ICD-10-CM | POA: Diagnosis not present

## 2021-06-19 ENCOUNTER — Other Ambulatory Visit: Payer: Self-pay | Admitting: *Deleted

## 2021-06-19 NOTE — Patient Outreach (Addendum)
[facility]) Care Management  06/19/2021  Melanie [name] 1946-01-09 [id number]  Unsuccessful outreach attempt made to patient. Patient answered the phone and stated that she would not be able to speak today. She did request that this nurse call back Friday 06/21/21.   Plan: RN Health Coach will call patient Friday 06/21/21.  Emelia Loron RN, BSN [place] [phone] Branae [email or web address]

## 2021-06-21 ENCOUNTER — Other Ambulatory Visit: Payer: Self-pay | Admitting: *Deleted

## 2021-06-21 ENCOUNTER — Encounter: Payer: Self-pay | Admitting: *Deleted

## 2021-06-21 NOTE — Patient Outreach (Signed)
[facility]) Care Management  [place]  06/21/2021   Melanie [name] 07-02-1946 [id number]  Subjective: Successful telephone outreach call to patient. HIPAA identifiers obtained. Patient states she is dong well at this time. Nurse discussed with patient her health goals and her health and wellness needs which were documented in the Epic system. Patient did not have any further questions or concerns today and did confirm that he/she has this nurse's contact number to call her if needed.   Encounter Medications:  Outpatient Encounter Medications as of 06/21/2021  Medication Sig Note   amLODipine (NORVASC) 2.5 MG tablet Take 1 tablet (2.5 mg total) by mouth daily. (Patient taking differently: Take 5 mg by mouth daily.)    anastrozole (ARIMIDEX) 1 MG tablet Take 1 tablet (1 mg total) by mouth daily.    aspirin EC 81 MG tablet Take 81 mg by mouth daily.     bisoprolol (ZEBETA) 5 MG tablet TAKE 1 TABLET DAILY (TO    REPLACE ZIAC COMBINATION   PILL)    Cyanocobalamin (VITAMIN B-12 IJ) Inject 1,000 Units as directed every 30 (thirty) days.    gabapentin (NEURONTIN) 300 MG capsule TAKE 3 CAPSULES 3 TIMES A  DAY    glipiZIDE (GLUCOTROL) 5 MG tablet Take 5 mg by mouth 2 (two) times daily before a meal.     lisinopril (ZESTRIL) 20 MG tablet TAKE 1 TABLET DAILY    metFORMIN (GLUCOPHAGE) 1000 MG tablet Take 1,000 mg by mouth 2 (two) times daily.    nitroGLYCERIN (NITROSTAT) 0.4 MG SL tablet Place 1 tablet (0.4 mg total) under the tongue every 5 (five) minutes as needed.    nortriptyline (PAMELOR) 10 MG capsule TAKE 4 CAPSULES BY MOUTH AT BEDTIME    Omega-3 Fatty Acids (FISH OIL PO) Take 2,000 Units by mouth 2 (two) times daily.    sertraline (ZOLOFT) 50 MG tablet Take 50 mg by mouth at bedtime.     Alirocumab (PRALUENT) 75 MG/ML SOAJ Inject 1 pen into the skin every 14 (fourteen) days. (Patient not taking: Reported on 06/21/2021) 06/21/2021: Working with her cardiologist to  receive grant   hydrochlorothiazide (MICROZIDE) 12.5 MG capsule Take 1 capsule (12.5 mg total) by mouth daily. (Patient not taking: Reported on 06/21/2021) 06/21/2021: Stopped due to low sodium   HYDROcodone-acetaminophen (NORCO/VICODIN) 5-325 MG tablet Take 1-2 tablets by mouth every 6 (six) hours as needed for moderate pain. (Patient not taking: Reported on 06/21/2021) 06/21/2021: completed   No facility-administered encounter medications on file as of 06/21/2021.    Functional Status:  No flowsheet data found.  Fall/Depression Screening: Fall Risk  06/21/2021 07/20/2020 07/09/2020  Falls in the past year? 0 0 0  Number falls in past yr: 0 0 0  Injury with Fall? 0 0 0  Risk Factor Category  - - -  Risk for fall due to : (No Data) - -  Risk for fall due to: Comment neuropatthy - -  Follow up Falls prevention discussed;Education provided;Falls evaluation completed - -   PHQ 2/9 Scores 06/21/2021  PHQ - 2 Score 1    Assessment:   Care Plan Care Plan : Hypertension (Adult)  Updates made by Michiel Cowboy, RN since 06/21/2021 12:00 AM     Problem: Hypertension (Hypertension)   Priority: Medium     Long-Range Goal: Hypertension Monitored   Start Date: 06/21/2021  Expected End Date: 07/14/2022  Note:   Evidence-based guidance:  Promote initial use of ambulatory blood pressure measurements (for  3 days) to rule out "white-coat" effect; identify masked hypertension and presence or absence of nocturnal "dipping" of blood pressure.   Encourage continued use of home blood pressure monitoring and recording in blood pressure log; include symptoms of hypotension or potential medication side effects in log.  Review blood pressure measurements taken inside and outside of the provider office; establish baseline and monitor trends; compare to target ranges or patient goal.  Share overall cardiovascular risk with patient; encourage changes to lifestyle risk factors, including alcohol consumption,  smoking, inadequate exercise, poor dietary habits and stress.   Notes:     Task: Identify and Monitor Blood Pressure Elevation   Due Date: 07/14/2022  Note:   Care Management Activities:    - blood pressure trends reviewed - depression screen reviewed - home or ambulatory blood pressure monitoring encouraged    Notes:     Care Plan : Diabetes Type 2 (Adult)  Updates made by Michiel Cowboy, RN since 06/21/2021 12:00 AM     Problem: Glycemic Management (Diabetes, Type 2)   Priority: Medium     Long-Range Goal: Glycemic Management Optimized   Start Date: 06/21/2021  Expected End Date: 07/14/2022  Note:   Evidence-based guidance:  Anticipate A1C testing (point-of-care) every 3 to 6 months based on goal attainment.  Review mutually-set A1C goal or target range.  Anticipate use of antihyperglycemic with or without insulin and periodic adjustments; consider active involvement of pharmacist.  Provide medical nutrition therapy and development of individualized eating.  Compare self-reported symptoms of hypo or hyperglycemia to blood glucose levels, diet and fluid intake, current medications, psychosocial and physiologic stressors, change in activity and barriers to care adherence.  Promote self-monitoring of blood glucose levels.  Assess and address barriers to management plan, such as food insecurity, age, developmental ability, depression, anxiety, fear of hypoglycemia or weight gain, as well as medication cost, side effects and complicated regimen.  Consider referral to community-based diabetes education program, visiting nurse, community health worker or health coach.  Encourage regular dental care for treatment of periodontal disease; refer to dental provider when needed.   Notes:     Task: Alleviate Barriers to Glycemic Management   Due Date: 07/14/2022  Note:   Care Management Activities:    - blood glucose monitoring encouraged - blood glucose readings reviewed - dental  care encouraged - mutual A1C goal set or reviewed - self-awareness of signs/symptoms of hypo or hyperglycemia encouraged - use of blood glucose monitoring log promoted    Notes:       Goals Addressed             This Visit's Progress    ([facility]) Monitor and Manage My Blood Sugar-Diabetes Type 2       Timeframe:  Long-Range Goal Priority:  Medium Start Date: 06/21/21                            Expected End Date: 07/14/22                      Follow Up Date 09/13/21    - check blood sugar at prescribed times - check blood sugar if I feel it is too high or too low - enter blood sugar readings and medication or insulin into daily log - take the blood sugar log to all doctor visits -Discussed limiting carbohydrates and sugar in her diet -Encouraged weight loss to help control Diabetes -Discussed  increasing physical activity as tolerated  -Discussed not skipping meals and to eat small amounts 5 times daily to avoid blood sugar drops   Why is this important?   Checking your blood sugar at home helps to keep it from getting very high or very low.  Writing the results in a diary or log helps the doctor know how to care for you.  Your blood sugar log should have the time, date and the results.  Also, write down the amount of insulin or other medicine that you take.  Other information, like what you ate, exercise done and how you were feeling, will also be helpful.     Notes: 06/21/21: Patient takes her blood sugar 1-2 times daily. Today's fasting blood sugar was 121. Patient states her blood sugar ranges are 110- less than 200. Nurse will send patient a diabetic planning healthy meals booklet.      [facility]) Patient will verbalized losing 2-5 pounds within the next 90 days       Timeframe:  Long-Range Goal Priority:  Medium Start Date:  06/20/21                           Expected End Date:  07/14/22 Follow Up Date: 09/13/21  -Discussed losing weight in 5 pound increments to be able to  reach goal and then reset for another 5 pounds -Encouraged patient to read food labels and to select healthier foods -Discussed increasing physical activity as tolerated  -Encouraged patient to increase water intake and decrease drinking sugar drinks like tea, Coke, Wyler's -Encouraged patient to start walking around her home area and to look into joining the [facility] which has a indoor track to walk -Discussed senior exercise program on TV as seen below If you cannot attend class in person, you can still exercise at home. Video taped versions of [employer] classes are shown on [employer] (GTN) at 8 am and 1 pm Mondays through Fridays. You can also purchase a copy of the AHOY DVD by calling [place] (GTN) [employer]. GTN is available on Spectrum channel 13 with a digital cable box and on [employer] channel 31. GTN is also available on [employer] U[employer]verse, channel 99. To view GTN, go to channel 99, press OK, select [place], then select GTN to start the channel.  Notes: 06/21/21: Patient states she would like to lose about 50 pounds. Nurse discussed losing weight in 5 pound increments to be able to reach the 5 pound goal and then reset for another 5 pounds. Nurse educated about increasing physical activity and diet. Nurse will send planning healthy meals booklet.     [facility]) Set My Target A1C-Diabetes Type 2       Timeframe:  Long-Range Goal Priority:  Medium Start Date:  06/21/21                           Expected End Date: 06/21/22                      Follow Up Date 12/12/20    - set target A1C    Why is this important?   Your target A1C is decided together by you and your doctor.  It is based on several things like your age and other health issues.    Notes: 06/21/21: Patient states her PCP wants her A1c to be 7 or below. Patient's  current A1c is 7.3 taken on  05/01/21     [facility]) Track and Manage My Blood Pressure-Hypertension       Timeframe:   Long-Range Goal Priority:  Medium Start Date: 06/21/21                            Expected End Date: 07/14/22                      Follow Up Date 09/13/21    - check blood pressure 3 times per week - write blood pressure results in a log or diary -Discussed eating a low sodium diet -Encouraged weight loss to help control hypertension -Discussed increasing physical activity as tolerated    Why is this important?   You won't feel high blood pressure, but it can still hurt your blood vessels.  High blood pressure can cause heart or kidney problems. It can also cause a stroke.  Making lifestyle changes like losing a little weight or eating less salt will help.  Checking your blood pressure at home and at different times of the day can help to control blood pressure.  If the doctor prescribes medicine remember to take it the way the doctor ordered.  Call the office if you cannot afford the medicine or if there are questions about it.     Notes: 06/21/21: Patient states her B/P has been running high recently. Patient explains that she takes her B/P 3-4 times weekly and her values transmit to her PCP's office for evaluation. B/P today was 157/86. Patient reports her systolic ranges are 681-157'W and diastolic ranges are 62-03'T. Nurse will send patient [place] education.       Plan: RN Health Coach will Send PCP a barrier letter and today's assessment note, will send patient advance directive documents, diabetes, and hypertension education, and will call patient within the month of December. Follow-up: Patient agrees to Care Plan and Follow-up.  Emelia Loron RN, BSN [place] [phone] [email or web address]

## 2021-06-21 NOTE — Patient Instructions (Addendum)
Goals Addressed             This Visit's Progress    ([facility]) Monitor and Manage My Blood Sugar-Diabetes Type 2       Timeframe:  Long-Range Goal Priority:  Medium Start Date: 06/21/21                            Expected End Date: 07/14/22                      Follow Up Date 09/13/21    - check blood sugar at prescribed times - check blood sugar if I feel it is too high or too low - enter blood sugar readings and medication or insulin into daily log - take the blood sugar log to all doctor visits -Discussed limiting carbohydrates and sugar in her diet -Encouraged weight loss to help control Diabetes -Discussed increasing physical activity as tolerated  -Discussed not skipping meals and to eat small amounts 5 times daily to avoid blood sugar drops   Why is this important?   Checking your blood sugar at home helps to keep it from getting very high or very low.  Writing the results in a diary or log helps the doctor know how to care for you.  Your blood sugar log should have the time, date and the results.  Also, write down the amount of insulin or other medicine that you take.  Other information, like what you ate, exercise done and how you were feeling, will also be helpful.     Notes: 06/21/21: Patient takes her blood sugar 1-2 times daily. Today's fasting blood sugar was 121. Patient states her blood sugar ranges are 110- less than 200. Nurse will send patient a diabetic planning healthy meals booklet.      [facility]) Patient will verbalized losing 2-5 pounds within the next 90 days       Timeframe:  Long-Range Goal Priority:  Medium Start Date:  06/20/21                           Expected End Date:  07/14/22 Follow Up Date: 09/13/21  -Discussed losing weight in 5 pound increments to be able to reach goal and then reset for another 5 pounds -Encouraged patient to read food labels and to select healthier foods -Discussed increasing physical activity as tolerated  -Encouraged patient  to increase water intake and decrease drinking sugar drinks like tea, Coke, Wyler's -Encouraged patient to start walking around her home area and to look into joining the [facility] which has a indoor track to walk -Discussed senior exercise program on TV as seen below If you cannot attend class in person, you can still exercise at home. Video taped versions of AHOY classes are shown on [employer] (GTN) at 8 am and 1 pm Mondays through Fridays. You can also purchase a copy of the AHOY DVD by calling [place] (GTN) [employer]. GTN is available on Spectrum channel 13 with a digital cable box and on [employer] channel 31. GTN is also available on [employer] U[employer]verse, channel 99. To view GTN, go to channel 99, press OK, select [place], then select GTN to start the channel.  Notes: 06/21/21: Patient states she would like to lose about 50 pounds. Nurse discussed losing weight in 5 pound increments to be able to reach the 5 pound goal and  then reset for another 5 pounds. Nurse educated about increasing physical activity and diet. Nurse will send planning healthy meals booklet.     [facility]) Set My Target A1C-Diabetes Type 2       Timeframe:  Long-Range Goal Priority:  Medium Start Date:  06/21/21                           Expected End Date: 06/21/22                      Follow Up Date 12/12/20    - set target A1C    Why is this important?   Your target A1C is decided together by you and your doctor.  It is based on several things like your age and other health issues.    Notes: 06/21/21: Patient states her PCP wants her A1c to be 7 or below. Patient's current A1c is 7.3 taken on  05/01/21     [facility]) Track and Manage My Blood Pressure-Hypertension       Timeframe:  Long-Range Goal Priority:  Medium Start Date: 06/21/21                            Expected End Date: 07/14/22                      Follow Up Date 09/13/21    - check blood pressure 3 times per  week - write blood pressure results in a log or diary -Discussed eating a low sodium diet -Encouraged weight loss to help control hypertension -Discussed increasing physical activity as tolerated    Why is this important?   You won't feel high blood pressure, but it can still hurt your blood vessels.  High blood pressure can cause heart or kidney problems. It can also cause a stroke.  Making lifestyle changes like losing a little weight or eating less salt will help.  Checking your blood pressure at home and at different times of the day can help to control blood pressure.  If the doctor prescribes medicine remember to take it the way the doctor ordered.  Call the office if you cannot afford the medicine or if there are questions about it.     Notes: 06/21/21: Patient states her B/P has been running high recently. Patient explains that she takes her B/P 3-4 times weekly and her values transmit to her PCP's office for evaluation. B/P today was 157/86. Patient reports her systolic ranges are 859-292'K and diastolic ranges are 46-28'M. Nurse will send patient [place] education.

## 2021-06-24 ENCOUNTER — Other Ambulatory Visit: Payer: Self-pay

## 2021-06-24 ENCOUNTER — Ambulatory Visit: Payer: Medicare HMO | Admitting: Neurology

## 2021-06-24 ENCOUNTER — Encounter: Payer: Self-pay | Admitting: Neurology

## 2021-06-24 VITALS — BP 146/75 | HR 74 | Ht 67.0 in | Wt 191.0 lb

## 2021-06-24 DIAGNOSIS — E1142 Type 2 diabetes mellitus with diabetic polyneuropathy: Secondary | ICD-10-CM

## 2021-06-24 MED ORDER — GABAPENTIN 300 MG PO CAPS: ORAL_CAPSULE | ORAL | 3 refills | Status: DC

## 2021-06-24 NOTE — Progress Notes (Signed)
Follow-up Visit   Date: 06/24/21    Melanie [name] MRN: [id number] DOB: September 04, 1946   Interim History: Melanie [name] is a 75 y.o. 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   Overall, her neuropathy is stable and continues to involve numbness from the lower legs down into her feet.  Painful tingling is controlled on gabapentin 900mg  TID and nortriptlyine 40mg  at bedtime.  Balance is fair, no falls.  She walks unassisted.  She is more mindful of the shoes that she wears and stopped wearing flip flops, which has helped her feel more steady. She sold her home earlier this year and relocated to a smaller rental home in [place].  She lives in a one-level home with a walk-in shower.   Medications:  Current Outpatient Medications on File Prior to Visit  Medication Sig Dispense Refill   amLODipine (NORVASC) 2.5 MG tablet Take 1 tablet (2.5 mg total) by mouth daily. (Patient taking differently: Take 5 mg by mouth daily.) 90 tablet 3   aspirin EC 81 MG tablet Take 81 mg by mouth daily.      bisoprolol (ZEBETA) 5 MG tablet TAKE 1 TABLET DAILY (TO    REPLACE ZIAC COMBINATION   PILL) 90 tablet 2   Cyanocobalamin (VITAMIN B-12 IJ) Inject 1,000 Units as directed every 30 (thirty) days.     gabapentin (NEURONTIN) 300 MG capsule TAKE 3 CAPSULES 3 TIMES A  DAY 810 capsule 0   glipiZIDE (GLUCOTROL) 5 MG tablet Take 5 mg by mouth 2 (two) times daily before a meal.      lisinopril (ZESTRIL) 20 MG tablet TAKE 1 TABLET DAILY 90 tablet 3   metFORMIN (GLUCOPHAGE) 1000 MG tablet Take 1,000 mg by mouth 2 (two) times daily.     nitroGLYCERIN (NITROSTAT) 0.4 MG SL tablet Place 1 tablet (0.4 mg total) under the tongue every 5 (five) minutes as needed. 25 tablet 3   nortriptyline  (PAMELOR) 10 MG capsule TAKE 4 CAPSULES BY MOUTH AT BEDTIME 360 capsule 3   sertraline (ZOLOFT) 50 MG tablet Take 50 mg by mouth at bedtime.      Alirocumab (PRALUENT) 75 MG/ML SOAJ Inject 1 pen into the skin every 14 (fourteen) days. (Patient not taking: No sig reported) 6 mL 3   anastrozole (ARIMIDEX) 1 MG tablet Take 1 tablet (1 mg total) by mouth daily. (Patient not taking: Reported on 06/24/2021) 90 tablet 3   hydrochlorothiazide (MICROZIDE) 12.5 MG capsule Take 1 capsule (12.5 mg total) by mouth daily. (Patient not taking: No sig reported) 90 capsule 3   HYDROcodone-acetaminophen (NORCO/VICODIN) 5-325 MG tablet Take 1-2 tablets by mouth every 6 (six) hours as needed for moderate pain. (Patient not taking: No sig reported) 30 tablet 0   Omega-3 Fatty Acids (FISH OIL PO) Take 2,000 Units by mouth 2 (two) times daily. (Patient not taking: Reported on 06/24/2021)     No current facility-administered medications on file prior to visit.    Allergies:  Allergies  Allergen Reactions   Fenofibrate Other (See Comments)    "muscle spasms"   Rosuvastatin     Muscle aches   Metoprolol Other (See Comments)    Headache    Vital Signs:  BP (!) 146/75   Pulse 74   Ht 5\' 7"  (1.702 m)   Wt 191 lb (86.6 kg)  SpO2 97%   BMI 29.91 kg/m   Neurological Exam: MENTAL STATUS including orientation to time, place, person, recent and remote memory, attention span and concentration, language, and fund of knowledge is normal.    CRANIAL NERVES: Pupils are around and reactive to light.  Extraocular muscles intact.  Face is symmetric   MOTOR:  Motor strength is 5/5 in all extremities, including distally in the feet.    MSRs:  Reflexes are 2+/4 in the upper extremities and absent in the lower extremities.   SENSORY:  Vibration is trace at the knees, absent at the ankles.  COORDINATION/GAIT:  Gait is stable and unassisted.  Data: MRI lumbar spine 04/03/2017: 1. Previously seen left subarticular disc  protrusion L3-L4 has involuted or has been resected. There remains severe left L3-L4 neural foraminal stenosis due to foraminal disc protrusion superimposed on a diffuse disc bulge. 2. Unchanged moderate right L4-L5 neural foraminal stenosis secondary to endplate spurring. 3. Posterior decompression at L2-L4 with wide patency of the thecal sac.   IMPRESSION/PLAN: Diabetic neuropathy affecting the feet and lower legs, pain is well-controlled, but she has progressive numbness  - Continue gabapentin 900mg  three times daily  - Continue nortriptyline 40mg  at bedtime   - Patient educated on daily foot inspection, fall prevention, and safety precautions around the home.  Return to clinic in 1 year  Total time spent reviewing records, interview, history/exam, documentation, and coordination of care on day of encounter:  20 min    Thank you for allowing me to participate in patient's care.  If I can answer any additional questions, I would be pleased to do so.    Sincerely,    Eula Mazzola K. Posey Pronto, DO

## 2021-06-24 NOTE — Patient Instructions (Signed)
Return to clinic in 1 year.

## 2021-06-28 ENCOUNTER — Other Ambulatory Visit: Payer: Self-pay | Admitting: Internal Medicine

## 2021-06-28 DIAGNOSIS — Z1231 Encounter for screening mammogram for malignant neoplasm of breast: Secondary | ICD-10-CM

## 2021-06-28 DIAGNOSIS — E538 Deficiency of other specified B group vitamins: Secondary | ICD-10-CM | POA: Diagnosis not present

## 2021-07-12 ENCOUNTER — Ambulatory Visit: Payer: Medicare HMO | Admitting: Neurology

## 2021-08-06 ENCOUNTER — Telehealth: Payer: Self-pay | Admitting: Pharmacy Technician

## 2021-08-06 DIAGNOSIS — Z596 Low income: Secondary | ICD-10-CM

## 2021-08-06 NOTE — Progress Notes (Signed)
[facility]                                            [place] Team    08/06/2021  KENSY BLIZARD 08/22/1946 [id number]  FOR 2023 RE ENROLLMENT                                      Medication Assistance Referral  Referral From: [place]   Medication/Company: Tyler [employer] / [employer] Patient application portion:  Education officer, museum portion: Faxed  to Dr. Brigitte Pulse Provider address/fax verified via: Office website     [employer]. Siyah Mault, [place]  [phone]

## 2021-08-14 DIAGNOSIS — I129 Hypertensive chronic kidney disease with stage 1 through stage 4 chronic kidney disease, or unspecified chronic kidney disease: Secondary | ICD-10-CM | POA: Diagnosis not present

## 2021-08-14 DIAGNOSIS — N1831 Chronic kidney disease, stage 3a: Secondary | ICD-10-CM | POA: Diagnosis not present

## 2021-08-14 DIAGNOSIS — E785 Hyperlipidemia, unspecified: Secondary | ICD-10-CM | POA: Diagnosis not present

## 2021-08-14 DIAGNOSIS — E1122 Type 2 diabetes mellitus with diabetic chronic kidney disease: Secondary | ICD-10-CM | POA: Diagnosis not present

## 2021-08-18 NOTE — Progress Notes (Signed)
[facility]  [phone] [phone]     ID: Melanie [name] DOB: 20-Dec-1945  MR#: [id number]  LKG#:[id number]  Patient Care Team: Ginger Organ., MD as PCP - General (Internal Medicine) Nahser, Wonda Cheng, MD as PCP - Cardiology (Cardiology) Faelynn Wynder, [name], MD as Consulting Physician (Oncology) Coralie Keens, MD as Consulting Physician (General Surgery) [name], DO as Consulting Physician (Neurology) Paula Compton, MD as Consulting Physician (Obstetrics and Gynecology) [name], MD as Consulting Physician (Orthopedic Surgery) Michiel Cowboy, RN as [facility] OTHER MD:  CHIEF COMPLAINT: Atypical ductal hyperplasia, lobular carcinoma in situ  CURRENT TREATMENT: Anastrozole   INTERVAL HISTORY: Melanie [name] returns today for follow-up of her atypical ductal hyperplasia, 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   She ran out of anastrozole in October.  She tells me she called and left message this year but was not able to get it refilled.  Went on it however she was having no symptoms whatsoever that she was aware of..  Since her last visit, she underwent short-term right diagnostic mammography and right breast ultrasonography at [facility] on 08/24/2020 showing: breast density category B; no evidence of malignancy; benign 4 mm cyst in upper-inner right breast, which accounts for screening mammographic finding.  She is scheduled for annual screening mammogram on 08/26/2021.  She also underwent lung cancer screening 11/19/2020 showing no evidence of malignancy.   REVIEW OF SYSTEMS: Melanie [name] sold her house and moved to [place].  She has a house there.  She does not like apartments or townhouse that she.  She does not have a garden and she does not have steps.  She is not exercising regularly.     COVID 19 VACCINATION STATUS: Status post Pfizer x2, most recent dose March 2021; no booster as of November  2022.  Has had COVID x1   HISTORY OF CURRENT ILLNESS: From the original intake note:  Melanie [name] had routine screening mammography on 09/22/2017 showing a possible abnormality in the right breast. She underwent bilateral diagnostic mammography with tomography and right breast ultrasonography at [facility] on 09/25/2017 showing: Indeterminate right breast mass in the central lateral right breast at middle depth. identified mammographically without associated ultrasound correlate. Evaluation of the right axilla demonstrates no suspicious adenopathy.   Accordingly on 10/01/2017 she proceeded to biopsy of the right breast area in question. The pathology from this procedure showed ([id number]): lobular carcinoma in situ. Atypical ductal hyperplasia. Fibrocystic changes.   On 10/27/2017, she underwent right breast lumpectomy showing ([id number]) focal atypical ductal hyperplasia and sclerosing adenosis. Fibroadenoma. Previous biopsy site and biopsy clip. No residual lobular carcinoma in situ. No invasive carcinoma.   The patient's subsequent history is as detailed below.   PAST MEDICAL HISTORY: Past Medical History:  Diagnosis Date   Arthritis    Bradycardia    Depression    under control   DM (diabetes mellitus) ([place])    type 2   Dyspnea    with some exertion   Family history of anesthesia complication    mother-nausea and vomiting   Family history of breast cancer    Family history of ovarian cancer    GERD (gastroesophageal reflux disease)    occasional   Headache    HTN (hypertension)    Neuropathy    legs and feet   Neuropathy    feet   PAF (paroxysmal atrial fibrillation) ([place]) 10 years ago   no problems since, due  to stress   Situational stress    "was taking care of sick husband"  stress related heart palpitations in 1998   PAST SURGICAL HISTORY: Past Surgical History:  Procedure Laterality Date   ABDOMINAL HYSTERECTOMY  1980   partial   APPENDECTOMY  1980    BACK SURGERY  2004   BREAST EXCISIONAL BIOPSY Right 2019   LCIS; ADH   BREAST LUMPECTOMY WITH RADIOACTIVE SEED LOCALIZATION Right 10/27/2017   Procedure: RIGHT BREAST PARTIAL MASTECTIOMY WITH RADIOACTIVE SEED LOCALIZATION ERAS PATHWAY;  Surgeon: Coralie Keens, MD;  Location: [facility];  Service: General;  Laterality: Right;   CARDIAC CATHETERIZATION     in the late 90's/early 2000's   CARPAL TUNNEL RELEASE Bilateral 2003, 2004   CHOLECYSTECTOMY N/A 12/28/2014   Procedure: LAPAROSCOPIC CHOLECYSTECTOMY WITH INTRAOPERATIVE CHOLANGIOGRAM;  Surgeon: Armandina Gemma, MD;  Location: [facility];  Service: General;  Laterality: N/A;   COLONOSCOPY  8 years ago   JOINT REPLACEMENT Bilateral 2007   KNEE ARTHROSCOPY Bilateral 1990's   LAMINECTOMY  11/28/2016   L 3    LUMBAR LAMINECTOMY N/A 07/20/2013   Procedure: MICRODISCECTOMY LUMBAR LAMINECTOMY;  Surgeon: [name], MD;  Location: [facility];  Service: Orthopedics;  Laterality: N/A;  L2-3 Decompression   LUMBAR LAMINECTOMY/DECOMPRESSION MICRODISCECTOMY N/A 11/28/2016   Procedure: L3 LAMINECTOMY, PARTIAL LAMINECTOMY WITH REMOVAL OF FREE FRAGMENT LEFT L3-4;  Surgeon: [name], MD;  Location: [facility];  Service: Orthopedics;  Laterality: N/A;   LUMBAR LAMINECTOMY/DECOMPRESSION MICRODISCECTOMY N/A 02/18/2017   Procedure: L3-4 MICRODISCECTOMY;  Surgeon: [name], MD;  Location: [facility];  Service: Orthopedics;  Laterality: N/A;    FAMILY HISTORY Family History  Problem Relation Age of Onset   Heart attack Father        Deceased, 21   Other Mother        Deceased, 80   Neuropathy Mother    Hypertension Mother    Breast cancer Sister        38's   Ovarian cancer Sister        dx and died in 73's   Multiple sclerosis Sister    Neuropathy Sister    Stroke Sister    Hypertension Sister    Cancer Maternal Uncle        dx >50, type unk   Stomach cancer Paternal Uncle        dx >50   Cancer Maternal Uncle        dx >50, type unk   Cancer Cousin 60        type of cancer unk  The patient's father died of a heart attack at age 19. The patient's mother died due to old age at 88.  The patient has no brothers and 3 sisters. The patient's youngest sister had breast cancer and passed away 5 years later due to ovarian cancer, which was diagnosed in her early 21's.  The patient denies any other family members with  breast or ovarian cancer.    GYNECOLOGIC HISTORY:  Menarche: 75 years old Age at first live birth: 75 years old [place] P2 LMP: status post hysterectomy without BSO at age 55 She used contraceptives for about 2 years. She did not use HRTs.   SOCIAL HISTORY: (Updated December 2022). Melanie [name] is now retired from working in a [employer] in [place] and [place]. Her husband, Melanie [name], who had been disabled with neck issues for the last 20 years, passed away from what sounds like metastatic esophageal cancer in 2020. 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 At home is the patient's new cat Bella.  The patient's oldest son, Melanie [name], works for the [employer] and lives in [place], [place].  The patient's youngest son, Melanie [name], works at [employer] in [place]. The patient has 3 grandchildren. She attends [employer].    ADVANCED DIRECTIVES: She intends to name her son Melanie [name] as her healthcare power of attorney (discussed 08/19/2022)   HEALTH MAINTENANCE: Social History   Tobacco Use   Smoking status: Every Day    Packs/day: 0.50    Years: 30.00    Pack years: 15.00    Types: Cigarettes   Smokeless tobacco: Never   Tobacco comments:    Patient states she has cut down to about 10-12 cigarettes a day. Patient reports she is not ready to quit.  Vaping Use   Vaping Use: Never used  Substance Use Topics   Alcohol use: No    Alcohol/week: 0.0 standard drinks   Drug use: No     Colonoscopy: 10 years ago at [facility]/ normal  PAP: s/p hysterctomy  Bone density: at Dr. Brigitte Pulse, normal per patient's  report   Allergies  Allergen Reactions   Fenofibrate Other (See Comments)    "muscle spasms"   Rosuvastatin     Muscle aches   Metoprolol Other (See Comments)    Headache    Current Outpatient Medications  Medication Sig Dispense Refill   Alirocumab (PRALUENT) 75 MG/ML SOAJ Inject 1 pen into the skin every 14 (fourteen) days. (Patient not taking: No sig reported) 6 mL 3   amLODipine (NORVASC) 2.5 MG tablet Take 1 tablet (2.5 mg total) by mouth daily. (Patient taking differently: Take 5 mg by mouth daily.) 90 tablet 3   anastrozole (ARIMIDEX) 1 MG tablet Take 1 tablet (1 mg total) by mouth daily. (Patient not taking: Reported on 06/24/2021) 90 tablet 3   aspirin EC 81 MG tablet Take 81 mg by mouth daily.      bisoprolol (ZEBETA) 5 MG tablet TAKE 1 TABLET DAILY (TO    REPLACE ZIAC COMBINATION   PILL) 90 tablet 2   Cyanocobalamin (VITAMIN B-12 IJ) Inject 1,000 Units as directed every 30 (thirty) days.     gabapentin (NEURONTIN) 300 MG capsule TAKE 3 CAPSULES 3 TIMES A  DAY 810 capsule 3   glipiZIDE (GLUCOTROL) 5 MG tablet Take 5 mg by mouth 2 (two) times daily before a meal.      hydrochlorothiazide (MICROZIDE) 12.5 MG capsule Take 1 capsule (12.5 mg total) by mouth daily. (Patient not taking: No sig reported) 90 capsule 3   HYDROcodone-acetaminophen (NORCO/VICODIN) 5-325 MG tablet Take 1-2 tablets by mouth every 6 (six) hours as needed for moderate pain. (Patient not taking: No sig reported) 30 tablet 0   lisinopril (ZESTRIL) 20 MG tablet TAKE 1 TABLET DAILY 90 tablet 3   metFORMIN (GLUCOPHAGE) 1000 MG tablet Take 1,000 mg by mouth 2 (two) times daily.     nitroGLYCERIN (NITROSTAT) 0.4 MG SL tablet Place 1 tablet (0.4 mg total) under the tongue every 5 (five) minutes as needed. 25 tablet 3   nortriptyline (PAMELOR) 10 MG capsule TAKE 4 CAPSULES BY MOUTH AT BEDTIME 360 capsule 3   Omega-3 Fatty Acids (FISH OIL PO) Take 2,000 Units by mouth 2 (two) times daily. (Patient not taking: Reported  on 06/24/2021)     sertraline (ZOLOFT) 50 MG tablet Take 50 mg by mouth at bedtime.  No current facility-administered medications for this visit.    OBJECTIVE: White woman in no acute distress  Vitals:   08/19/21 1225  BP: (!) 157/73  Pulse: 77  Resp: 18  Temp: 97.7 F (36.5 C)  SpO2: 98%      Body mass index is 30.29 kg/m.   Wt Readings from Last 3 Encounters:  08/19/21 193 lb 6.4 oz (87.7 kg)  06/24/21 191 lb (86.6 kg)  08/22/20 190 lb (86.2 kg)      ECOG FS:1 - Symptomatic but completely ambulatory  Sclerae unicteric, EOMs intact Wearing a mask No cervical or supraclavicular adenopathy Lungs no rales or rhonchi Heart regular rate and rhythm Abd soft, obese, nontender, positive bowel sounds MSK no focal spinal tenderness, no upper extremity lymphedema Neuro: nonfocal, well oriented, appropriate affect Breasts: The right breast has undergone lumpectomy.  There is no evidence of recurrence.  The left breast and both axillae are benign.   LAB RESULTS:  CMP     Component Value Date/Time   NA 138 08/16/2020 1059   K 4.8 08/16/2020 1059   CL 103 08/16/2020 1059   CO2 27 08/16/2020 1059   GLUCOSE 106 (H) 08/16/2020 1059   BUN 13 08/16/2020 1059   CREATININE 1.21 (H) 08/16/2020 1059   CREATININE 1.20 (H) 11/10/2017 1504   CALCIUM 9.8 08/16/2020 1059   PROT 7.1 08/16/2020 1059   PROT 6.6 08/19/2019 0805   ALBUMIN 3.6 08/16/2020 1059   ALBUMIN 4.2 08/19/2019 0805   AST 31 08/16/2020 1059   AST 10 11/10/2017 1504   ALT 24 08/16/2020 1059   ALT 10 11/10/2017 1504   ALKPHOS 69 08/16/2020 1059   BILITOT 0.3 08/16/2020 1059   BILITOT 0.2 08/19/2019 0805   BILITOT 0.3 11/10/2017 1504   GFRNONAA 48 (L) 08/16/2020 1059   GFRNONAA 45 (L) 11/10/2017 1504   GFRAA 52 (L) 11/10/2017 1504    Lab Results  Component Value Date   WBC 6.1 08/19/2021   NEUTROABS 3.4 08/19/2021   HGB 12.4 08/19/2021   HCT 38.9 08/19/2021   MCV 97.7 08/19/2021   PLT 180 08/19/2021    No results found for: LABCA2  No components found for: [id number]  No results for input(s): INR in the last 168 hours.  No results found for: LABCA2  No results found for: KGS811  No results found for: SRP594  No results found for: VOP929  No results found for: CA2729  No components found for: HGQUANT  No results found for: CEA1 / No results found for: CEA1   No results found for: AFPTUMOR  No results found for: [facility]  Lab Results  Component Value Date   TOTALPROTELP 7.4 09/22/2013   ALBUMINELP 61.6 09/22/2013   A1GS 3.4 09/22/2013   A2GS 11.5 09/22/2013   BETS 7.1 09/22/2013   BETA2SER 4.2 09/22/2013   GAMS 12.2 09/22/2013   MSPIKE NOT DET 09/22/2013   SPEI  09/22/2013     Comment:     The possibility of a faint restricted band(s) cannot be completely excluded in the gamma region.  Suggest serum IFE to evaluate possibility, if clinically indicated. (Lab will hold sample one week. Please call Customer Service at [phone] to add test.) Reviewed by Odis Hollingshead, MD, PhD, FCAP (Electronic Signature on File)   (this displays SPEP labs)  Lab Results  Component Value Date   KAPLAMBRATIO 8.29 09/22/2013   (kappa/lambda light chains)  No results found for: HGBA, HGBA2QUANT, HGBFQUANT, HGBSQUAN (Hemoglobinopathy evaluation)   No  results found for: LDH  No results found for: IRON, TIBC, IRONPCTSAT (Iron and TIBC)  No results found for: FERRITIN  Urinalysis    Component Value Date/Time   COLORURINE YELLOW 06/05/2017 0052   APPEARANCEUR HAZY (A) 06/05/2017 0052   LABSPEC 1.015 06/05/2017 0052   PHURINE 5.0 06/05/2017 0052   GLUCOSEU NEGATIVE 06/05/2017 0052   HGBUR NEGATIVE 06/05/2017 0052   BILIRUBINUR NEGATIVE 06/05/2017 0052   KETONESUR NEGATIVE 06/05/2017 0052   PROTEINUR NEGATIVE 06/05/2017 0052   NITRITE NEGATIVE 06/05/2017 0052   LEUKOCYTESUR MODERATE (A) 06/05/2017 0052    STUDIES: No results found.   ELIGIBLE FOR  AVAILABLE RESEARCH PROTOCOL: no  ASSESSMENT: 75 y.o. [place] woman status post right breast biopsy 10/01/2017 showing lobular carcinoma in situ and atypical ductal hyperplasia  (1) right lumpectomy 10/27/2017 shows focal atypical ductal hyperplasia  (2) additional cancer risk factors:   (a) family history of breast and ovarian cancer  (b) breast density category C  (3) anastrozole started 11/09/2017  (a) baseline bone density reportedly normal  (4) intensified screening   (a) breast MRI 11/30/2017 showed no evidence of malignancy  (b) intensified screening discontinued after November 2020 for financial reasons  (5) genetics testing 01/27/2018 identified a 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 STK11, TP53, TSC1, TSC2, and VHL.  The following genes were evaluated for sequence changes only: SDHA and HOXB13 c.251G>A variant only.   PLAN: Sharvi is coming up on 4 years from her lumpectomy showing lobular carcinoma in situ.  The plan is to continue anastrozole for total of 5 years.  I have gone ahead and refill that for her through [facility] and hopefully as she will receive it but if she does not she will call and let us know.  She will have her mammogram next week and then again a year from next week so I am making her a return appointment here later in December 2023 so she will have had her mammogram that year prior to that visit.  At that point she can consider  graduating.  She will get her "C.B.H." degree at that time.  She tells me that stands for "clean bill of health".  Total encounter time 20 minutes.*  Ewald Beg, [name], MD  08/19/21 12:38 PM Medical Oncology and [facility] [place], [place] [address] Tel. [phone]    Fax. [phone]    I, Wilburn Mylar, am acting as scribe for Dr. [name]. Ambri Miltner.  I, Lurline Del MD, have reviewed the above documentation for accuracy and completeness, and I agree with the above.   *Total Encounter Time as defined by the Centers for Medicare and Medicaid Services includes, in addition to the face-to-face time of a patient visit (documented in the note above) non-face-to-face time: obtaining and reviewing outside history, ordering and reviewing medications, tests or procedures, care coordination (communications with other health care professionals or caregivers) and documentation in the medical record.

## 2021-08-19 ENCOUNTER — Inpatient Hospital Stay: Payer: Medicare HMO

## 2021-08-19 ENCOUNTER — Other Ambulatory Visit: Payer: Self-pay

## 2021-08-19 ENCOUNTER — Inpatient Hospital Stay: Payer: Medicare HMO | Attending: Oncology | Admitting: Oncology

## 2021-08-19 VITALS — BP 157/73 | HR 77 | Temp 97.7°F | Resp 18 | Ht 67.0 in | Wt 193.4 lb

## 2021-08-19 DIAGNOSIS — Z800 Family history of malignant neoplasm of digestive organs: Secondary | ICD-10-CM | POA: Diagnosis not present

## 2021-08-19 DIAGNOSIS — Z79899 Other long term (current) drug therapy: Secondary | ICD-10-CM | POA: Insufficient documentation

## 2021-08-19 DIAGNOSIS — E119 Type 2 diabetes mellitus without complications: Secondary | ICD-10-CM | POA: Diagnosis not present

## 2021-08-19 DIAGNOSIS — Z809 Family history of malignant neoplasm, unspecified: Secondary | ICD-10-CM | POA: Insufficient documentation

## 2021-08-19 DIAGNOSIS — Z803 Family history of malignant neoplasm of breast: Secondary | ICD-10-CM | POA: Insufficient documentation

## 2021-08-19 DIAGNOSIS — Z8249 Family history of ischemic heart disease and other diseases of the circulatory system: Secondary | ICD-10-CM | POA: Diagnosis not present

## 2021-08-19 DIAGNOSIS — N6021 Fibroadenosis of right breast: Secondary | ICD-10-CM | POA: Insufficient documentation

## 2021-08-19 DIAGNOSIS — Z820 Family history of epilepsy and other diseases of the nervous system: Secondary | ICD-10-CM | POA: Diagnosis not present

## 2021-08-19 DIAGNOSIS — Z79811 Long term (current) use of aromatase inhibitors: Secondary | ICD-10-CM | POA: Insufficient documentation

## 2021-08-19 DIAGNOSIS — D241 Benign neoplasm of right breast: Secondary | ICD-10-CM | POA: Insufficient documentation

## 2021-08-19 DIAGNOSIS — D0501 Lobular carcinoma in situ of right breast: Secondary | ICD-10-CM | POA: Diagnosis not present

## 2021-08-19 DIAGNOSIS — D0511 Intraductal carcinoma in situ of right breast: Secondary | ICD-10-CM | POA: Insufficient documentation

## 2021-08-19 DIAGNOSIS — Z823 Family history of stroke: Secondary | ICD-10-CM | POA: Insufficient documentation

## 2021-08-19 DIAGNOSIS — Z8041 Family history of malignant neoplasm of ovary: Secondary | ICD-10-CM | POA: Insufficient documentation

## 2021-08-19 DIAGNOSIS — Z8269 Family history of other diseases of the musculoskeletal system and connective tissue: Secondary | ICD-10-CM | POA: Insufficient documentation

## 2021-08-19 DIAGNOSIS — N6091 Unspecified benign mammary dysplasia of right breast: Secondary | ICD-10-CM

## 2021-08-19 DIAGNOSIS — Z9049 Acquired absence of other specified parts of digestive tract: Secondary | ICD-10-CM | POA: Diagnosis not present

## 2021-08-19 LAB — COMPREHENSIVE METABOLIC PANEL
ALT: 18 U/L (ref 0–44)
AST: 15 U/L (ref 15–41)
Albumin: 3.8 g/dL (ref 3.5–5.0)
Alkaline Phosphatase: 90 U/L (ref 38–126)
Anion gap: 12 (ref 5–15)
BUN: 10 mg/dL (ref 8–23)
CO2: 21 mmol/L — ABNORMAL LOW (ref 22–32)
Calcium: 8.9 mg/dL (ref 8.9–10.3)
Chloride: 104 mmol/L (ref 98–111)
Creatinine, Ser: 1.18 mg/dL — ABNORMAL HIGH (ref 0.44–1.00)
GFR, Estimated: 49 mL/min — ABNORMAL LOW (ref >60)
Glucose, Bld: 274 mg/dL — ABNORMAL HIGH (ref 70–99)
Potassium: 4.3 mmol/L (ref 3.5–5.1)
Sodium: 137 mmol/L (ref 135–145)
Total Bilirubin: 0.2 mg/dL — ABNORMAL LOW (ref 0.3–1.2)
Total Protein: 7.5 g/dL (ref 6.5–8.1)

## 2021-08-19 LAB — CBC WITH DIFFERENTIAL/PLATELET
Abs Immature Granulocytes: 0.03 10*3/uL (ref 0.00–0.07)
Basophils Absolute: 0 10*3/uL (ref 0.0–0.1)
Basophils Relative: 1 %
Eosinophils Absolute: 0.3 10*3/uL (ref 0.0–0.5)
Eosinophils Relative: 5 %
HCT: 38.9 % (ref 36.0–46.0)
Hemoglobin: 12.4 g/dL (ref 12.0–15.0)
Immature Granulocytes: 1 %
Lymphocytes Relative: 32 %
Lymphs Abs: 1.9 10*3/uL (ref 0.7–4.0)
MCH: 31.2 pg (ref 26.0–34.0)
MCHC: 31.9 g/dL (ref 30.0–36.0)
MCV: 97.7 fL (ref 80.0–100.0)
Monocytes Absolute: 0.4 10*3/uL (ref 0.1–1.0)
Monocytes Relative: 6 %
Neutro Abs: 3.4 10*3/uL (ref 1.7–7.7)
Neutrophils Relative %: 55 %
Platelets: 180 10*3/uL (ref 150–400)
RBC: 3.98 MIL/uL (ref 3.87–5.11)
RDW: 13.7 % (ref 11.5–15.5)
WBC: 6.1 10*3/uL (ref 4.0–10.5)
nRBC: 0 % (ref 0.0–0.2)

## 2021-08-19 MED ORDER — ANASTROZOLE 1 MG PO TABS: 1.0000 mg | ORAL_TABLET | Freq: Every day | ORAL | 6 refills | Status: DC

## 2021-08-20 ENCOUNTER — Telehealth: Payer: Self-pay | Admitting: Oncology

## 2021-08-20 NOTE — Telephone Encounter (Signed)
Scheduled appointment per 1/25 los. Patient is aware. 

## 2021-08-26 ENCOUNTER — Telehealth: Payer: Self-pay | Admitting: Oncology

## 2021-08-26 ENCOUNTER — Other Ambulatory Visit: Payer: Self-pay

## 2021-08-26 ENCOUNTER — Ambulatory Visit
Admission: RE | Admit: 2021-08-26 | Discharge: 2021-08-26 | Disposition: A | Discharge status: Home / Self Care | Payer: Medicare HMO | Source: Ambulatory Visit | Attending: Internal Medicine | Admitting: Internal Medicine

## 2021-08-26 DIAGNOSIS — Z1231 Encounter for screening mammogram for malignant neoplasm of breast: Secondary | ICD-10-CM | POA: Diagnosis not present

## 2021-08-26 NOTE — Telephone Encounter (Signed)
Patient will receive updated calendar.

## 2021-08-28 ENCOUNTER — Encounter: Payer: Self-pay | Admitting: Cardiovascular Disease

## 2021-08-28 ENCOUNTER — Other Ambulatory Visit: Payer: Self-pay

## 2021-08-28 ENCOUNTER — Ambulatory Visit: Payer: Medicare HMO | Admitting: Cardiovascular Disease

## 2021-08-28 VITALS — BP 142/74 | HR 75 | Ht 67.0 in | Wt 191.6 lb

## 2021-08-28 DIAGNOSIS — R079 Chest pain, unspecified: Secondary | ICD-10-CM
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
## 2021-08-28 NOTE — Patient Instructions (Signed)
Medication Instructions:   Your physician recommends that you continue on your current medications as directed. Please refer to the Current Medication list given to you today.  *If you need a refill on your cardiac medications before your next appointment, please call your pharmacy*   Follow-Up: At [facility], you and your health needs are our priority.  As part of our continuing mission to provide you with exceptional heart care, we have created designated Provider Care Teams.  These Care Teams include your primary Cardiologist (physician) and Advanced Practice Providers (APPs -  Physician Assistants and Nurse Practitioners) who all work together to provide you with the care you need, when you need it.  We recommend signing up for the patient portal called "MyChart".  Sign up information is provided on this After Visit Summary.  MyChart is used to connect with patients for Virtual Visits (Telemedicine).  Patients are able to view lab/test results, encounter notes, upcoming appointments, etc.  Non-urgent messages can be sent to your provider as well.   To learn more about what you can do with MyChart, go to [email or web address].    Your next appointment:   1 year(s)  The format for your next appointment:   In Person  Provider:   [name], MD  or [address] PA-C, Nicholes Rough, PA-C, Melina Copa, PA-C, Cecilie Kicks, NP, Ermalinda Barrios, PA-C, Christen Bame, NP, or Richardson Dopp, PA-C

## 2021-09-11 ENCOUNTER — Other Ambulatory Visit: Payer: Self-pay | Admitting: *Deleted

## 2021-09-11 NOTE — Patient Outreach (Signed)
[facility]) Care Management  09/11/2021  Melanie [name] [birth year] [id number]  [facility]) Care Management RN Health Coach Note   09/11/2021 Name:  Melanie [name] MRN:  [id number] DOB:  07-Feb-1946  Summary: Patient states that her blood sugar values are improving with ranges of 70-155. Patient explains that she continues to participate with the remote home B/P monitoring program connected to her PCP. She feels that her values are improving. Patient reports that she has lost around 9 pounds, she is walking routinely at home and wants to increase the distance to 1/2 mile, and she is making healthier food choices.   Recommendations/Changes made from today's visit: Continue to walk and increase the distance up to 1/2 mile 3-4 times a week; may walk in place inside when weather is bad Call Dr. Brigitte Pulse to ask for an order for new blood sugar monitor Make an appointment to get your flu and B-12 shot Continue to participate in home B/P monitoring program connected to Dr. Brigitte Pulse  Subjective: Melanie [name] is an 75 y.o. year old female who is a primary patient of Ginger Organ., MD. The care management team was consulted for assistance with care management and/or care coordination needs.    RN Health Coach completed Telephone Visit today.   Objective:  Medications Reviewed Today     Reviewed by Michiel Cowboy, RN (Registered Nurse) on 09/11/21 at 73  Med List Status: <None>   Medication Order Taking? Sig Documenting Provider Last Dose Status Informant  Alirocumab (PRALUENT) 75 MG/ML SOAJ [id number] No Inject 1 pen into the skin every 14 (fourteen) days.  Patient not taking: Reported on 06/21/2021   Nahser, Wonda Cheng, MD Not Taking Active            Med Note [name], Avian Konigsberg A   Fri Jun 21, 2021 10:07 AM) Working with her cardiologist to receive grant  amLODipine (NORVASC) 2.5 MG tablet [id number] No Take 1 tablet (2.5 mg total) by mouth daily.   Patient not taking: Reported on 08/28/2021   Nahser, Wonda Cheng, MD Not Taking Active   amLODipine (NORVASC) 5 MG tablet [id number] Yes Take 5 mg by mouth daily. [provider] Taking Active   anastrozole (ARIMIDEX) 1 MG tablet [id number] Yes Take 1 tablet (1 mg total) by mouth daily. Magrinat, [name], MD Taking Active   aspirin EC 81 MG tablet [id number] Yes Take 81 mg by mouth daily.  [provider] Taking Active Self  bisoprolol (ZEBETA) 5 MG tablet [id number] Yes TAKE 1 TABLET DAILY (TO    REPLACE ZIAC COMBINATION   PILL) Nahser, Wonda Cheng, MD Taking Active   Cyanocobalamin (VITAMIN B-12 IJ) [id number] Yes Inject 1,000 Units as directed every 30 (thirty) days. [provider] Taking Active   gabapentin (NEURONTIN) 300 MG capsule [id number] Yes TAKE 3 CAPSULES 3 TIMES A  DAY Patel, Donika K, DO Taking Active   glipiZIDE (GLUCOTROL) 5 MG tablet [id number] Yes Take 5 mg by mouth 2 (two) times daily before a meal.  [provider] Taking Active Self  hydrochlorothiazide (MICROZIDE) 12.5 MG capsule [id number] No Take 1 capsule (12.5 mg total) by mouth daily.  Patient not taking: Reported on 09/11/2021   Nahser, Wonda Cheng, MD Not Taking Active            Med Note [name], Ayyan Sites A   Fri Jun 21, 2021 10:08 AM) Stopped due to low sodium  HYDROcodone-acetaminophen (NORCO/VICODIN) 5-325 MG tablet [id number] No  Take 1-2 tablets by mouth every 6 (six) hours as needed for moderate pain.  Patient not taking: Reported on 09/11/2021   [name], MD Not Taking Active            Med Note [name], Sharmayne Jablon A   Fri Jun 21, 2021 10:17 AM) completed  insulin degludec (TRESIBA) 100 UNIT/ML FlexTouch Pen [id number] Yes Inject into the skin. [provider] Taking Active   lisinopril (ZESTRIL) 20 MG tablet [id number] No TAKE 1 TABLET DAILY  Patient not taking: Reported on 08/28/2021   Nahser, Wonda Cheng, MD Not Taking Active   lisinopril (ZESTRIL) 40 MG tablet [id number] Yes Take 40 mg by  mouth daily. [provider] Taking Active   metFORMIN (GLUCOPHAGE) 1000 MG tablet [id number] Yes Take 1,000 mg by mouth 2 (two) times daily. [provider] Taking Active Self  nitroGLYCERIN (NITROSTAT) 0.4 MG SL tablet [id number] Yes Place 1 tablet (0.4 mg total) under the tongue every 5 (five) minutes as needed. Richardson Dopp T, PA-C Taking Active   nortriptyline (PAMELOR) 10 MG capsule [id number] Yes TAKE 4 CAPSULES BY MOUTH AT BEDTIME Narda Amber K, DO Taking Active   Omega-3 Fatty Acids (FISH OIL PO) [id number] Yes Take 2,000 Units by mouth 2 (two) times daily. [provider] Taking Active   sertraline (ZOLOFT) 50 MG tablet [id number] Yes Take 50 mg by mouth at bedtime.  [provider] Taking Active Self             SDOH:  (Social Determinants of Health) assessments and interventions performed: SDOH assessments completed today and documented in the Epic system.    Care Plan  Review of patient past medical history, allergies, medications, health status, including review of consultants reports, laboratory and other test data, was performed as part of comprehensive evaluation for care management services.   Care Plan : Hypertension (Adult)  Updates made by Michiel Cowboy, RN since 09/11/2021 12:00 AM     Problem: Hypertension (Hypertension) Resolved 09/11/2021  Priority: Medium     Long-Range Goal: Hypertension Monitored Completed 09/11/2021  Start Date: 06/21/2021  Expected End Date: 07/14/2022  Note:   Resolving due to duplicate goal  Evidence-based guidance:  Promote initial use of ambulatory blood pressure measurements (for 3 days) to rule out "white-coat" effect; identify masked hypertension and presence or absence of nocturnal "dipping" of blood pressure.   Encourage continued use of home blood pressure monitoring and recording in blood pressure log; include symptoms of hypotension or potential medication side effects in log.  Review  blood pressure measurements taken inside and outside of the provider office; establish baseline and monitor trends; compare to target ranges or patient goal.  Share overall cardiovascular risk with patient; encourage changes to lifestyle risk factors, including alcohol consumption, smoking, inadequate exercise, poor dietary habits and stress.   Notes:     Task: Identify and Monitor Blood Pressure Elevation Completed 09/11/2021  Due Date: 07/14/2022  Note:   Care Management Activities:    - blood pressure trends reviewed - depression screen reviewed - home or ambulatory blood pressure monitoring encouraged    Notes:     Care Plan : Diabetes Type 2 (Adult)  Updates made by Michiel Cowboy, RN since 09/11/2021 12:00 AM     Problem: Glycemic Management (Diabetes, Type 2) Resolved 09/11/2021  Priority: Medium     Long-Range Goal: Glycemic Management Optimized Completed 09/11/2021  Start Date: 06/21/2021  Expected End Date: 07/14/2022  Note:   Resolving due  to duplicate goal  Evidence-based guidance:  Anticipate A1C testing (point-of-care) every 3 to 6 months based on goal attainment.  Review mutually-set A1C goal or target range.  Anticipate use of antihyperglycemic with or without insulin and periodic adjustments; consider active involvement of pharmacist.  Provide medical nutrition therapy and development of individualized eating.  Compare self-reported symptoms of hypo or hyperglycemia to blood glucose levels, diet and fluid intake, current medications, psychosocial and physiologic stressors, change in activity and barriers to care adherence.  Promote self-monitoring of blood glucose levels.  Assess and address barriers to management plan, such as food insecurity, age, developmental ability, depression, anxiety, fear of hypoglycemia or weight gain, as well as medication cost, side effects and complicated regimen.  Consider referral to community-based diabetes education program,  visiting nurse, community health worker or health coach.  Encourage regular dental care for treatment of periodontal disease; refer to dental provider when needed.   Notes:     Task: Alleviate Barriers to Glycemic Management Completed 09/11/2021  Due Date: 07/14/2022  Note:   Care Management Activities:    - blood glucose monitoring encouraged - blood glucose readings reviewed - dental care encouraged - mutual A1C goal set or reviewed - self-awareness of signs/symptoms of hypo or hyperglycemia encouraged - use of blood glucose monitoring log promoted    Notes:     Care Plan : [place] of Care  Updates made by Michiel Cowboy, RN since 09/11/2021 12:00 AM     Problem: Knowledge Deficit Related to Diabetes and Hypertension   Priority: High     Long-Range Goal: Development of Plan of Care for Management of Diabetes and Hypertension   Start Date: 09/11/2021  Expected End Date: 09/13/2022  Priority: High  Note:   Current Barriers:  Knowledge Deficits related to plan of care for management of HTN and DMII   RNCM Clinical Goal(s):  Patient will demonstrate Improved adherence to prescribed treatment plan for HTN and DMII as evidenced by decreasing her A1c below 7 and maintaining B/P below 150/90; adhering to low sodium and diabetic diet; taking her medications as prescribed continue to work with [place] to address care management and care coordination needs related to  HTN and DMII as evidenced by adherence to CM Team Scheduled appointments through collaboration with RN Care manager, provider, and care team.   Interventions: Inter-disciplinary care team collaboration (see longitudinal plan of care) Evaluation of current treatment plan related to  self management and patient's adherence to plan as established by provider   Diabetes Interventions:  (Status:  Goal on track:  Yes.) Long Term Goal Assessed patient's understanding of A1c goal: <7% Reviewed  medications with patient and discussed importance of medication adherence Discussed plans with patient for ongoing care management follow up and provided patient with direct contact information for care management team Provided patient with written educational materials related to hypo and hyperglycemia and importance of correct treatment Advised patient, providing education and rationale, to check cbg 1-2 and record, calling PCP for findings outside established parameters Discussed adhering to low sugar and low carbohydrate diet Encouraged increasing physical activity by walking 3-4 times weekly Lab Results  Component Value Date   HGBA1C 6.5 (H) 11/27/2016   Hypertension Interventions:  (Status:  Goal on track:  Yes.) Long Term Goal Last practice recorded BP readings:  BP Readings from Last 3 Encounters:  08/28/21 (!) 142/74  08/19/21 (!) 157/73  06/24/21 (!) 146/75  Most recent eGFR/CrCl: No results found  for: EGFR  No components found for: CRCL  Evaluation of current treatment plan related to hypertension self management and patient's adherence to plan as established by provider Reviewed medications with patient and discussed importance of compliance Discussed plans with patient for ongoing care management follow up and provided patient with direct contact information for care management team Provided education on prescribed diet Dash diet Discussed complications of poorly controlled blood pressure such as heart disease, stroke, circulatory complications, vision complications, kidney impairment, sexual dysfunction Encouraged patient to walk 3-4 times weekly and to increase distance to  1/2 mile gradually Discussed continuation of remote B/P monitoring program connected to her PCP  Patient Goals/Self-Care Activities: Take all medications as prescribed Attend all scheduled provider appointments Call pharmacy for medication refills 3-7 days in advance of running out of medications keep  appointment with eye doctor check blood sugar at prescribed times: 1-2 times daily and contact PCP for concerning values check feet daily for cuts, sores or redness enter blood sugar readings and medication or insulin into daily log take the blood sugar log to all doctor visits set goal weight trim toenails straight across drink 6 to 8 glasses of water each day fill half of plate with vegetables limit fast food meals to no more than 1 per week manage portion size set a realistic goal wear comfortable, well-fitting shoes call doctor for signs and symptoms of high blood pressure keep all doctor appointments take medications for blood pressure exactly as prescribed eat more whole grains, fruits and vegetables, lean meats and healthy fats Monitor the amount of sodium/salt you eat Continue to walk and increase the distance up to 1/2 mile 3-4 times a week; may walk in place inside when weather is bad Call Dr. Brigitte Pulse to ask for an order for new blood sugar monitor Make an appointment to get your flu and B-12 shot Continue to participate in home B/P monitoring program connected to Dr. Brigitte Pulse  Follow Up Plan:  Telephone follow up appointment with care management team member scheduled for:  March       Plan: Telephone follow up appointment with care management team member scheduled for:  March. Nurse will send PCP a quarterly update.  Emelia Loron RN, [place] [phone] [email or web address]

## 2021-09-11 NOTE — Patient Instructions (Signed)
Visit Information  Thank you for taking time to visit with me today. Please don't hesitate to contact me if I can be of assistance to you before our next scheduled telephone appointment.  Following are the goals we discussed today:  Patient Goals/Self-Care Activities: Take all medications as prescribed Attend all scheduled provider appointments Call pharmacy for medication refills 3-7 days in advance of running out of medications keep appointment with eye doctor check blood sugar at prescribed times: 1-2 times daily and contact PCP for concerning values check feet daily for cuts, sores or redness enter blood sugar readings and medication or insulin into daily log take the blood sugar log to all doctor visits set goal weight trim toenails straight across drink 6 to 8 glasses of water each day fill half of plate with vegetables limit fast food meals to no more than 1 per week manage portion size set a realistic goal wear comfortable, well-fitting shoes call doctor for signs and symptoms of high blood pressure keep all doctor appointments take medications for blood pressure exactly as prescribed eat more whole grains, fruits and vegetables, lean meats and healthy fats Monitor the amount of sodium/salt you eat Continue to walk and increase the distance up to 1/2 mile 3-4 times a week; may walk in place inside when weather is bad Call Dr. Brigitte Pulse to ask for an order for new blood sugar monitor Make an appointment to get your flu and B-12 shot Continue to participate in home B/P monitoring program connected to Dr. Brigitte Pulse  The patient verbalized understanding of instructions, educational materials, and care plan provided today and agreed to receive a mailed copy of patient instructions, educational materials, and care plan.   Telephone follow up appointment with care management team member scheduled [id number]  Emelia Loron RN, [place] [phone] [email or web address]

## 2021-09-13 ENCOUNTER — Other Ambulatory Visit: Payer: Self-pay

## 2021-09-13 ENCOUNTER — Telehealth: Payer: Self-pay | Admitting: Cardiovascular Disease

## 2021-09-13 DIAGNOSIS — I129 Hypertensive chronic kidney disease with stage 1 through stage 4 chronic kidney disease, or unspecified chronic kidney disease: Secondary | ICD-10-CM | POA: Diagnosis not present

## 2021-09-13 DIAGNOSIS — E1122 Type 2 diabetes mellitus with diabetic chronic kidney disease: Secondary | ICD-10-CM | POA: Diagnosis not present

## 2021-09-13 DIAGNOSIS — N1831 Chronic kidney disease, stage 3a: Secondary | ICD-10-CM | POA: Diagnosis not present

## 2021-09-13 DIAGNOSIS — E785 Hyperlipidemia, unspecified: Secondary | ICD-10-CM | POA: Diagnosis not present

## 2021-09-13 MED ORDER — BISOPROLOL FUMARATE 5 MG PO TABS: ORAL_TABLET | ORAL | 3 refills | Status: DC

## 2021-09-13 NOTE — Telephone Encounter (Signed)
°*  STAT* If patient is at the pharmacy, call can be transferred to refill team.   1. Which medications need to be refilled? (please list name of each medication and dose if known) bisoprolol (ZEBETA) 5 MG tablet  2. Which pharmacy/location (including street and city if local pharmacy) is medication to be sent to? TAKE 1 TABLET DAILY (TO REPLACE ZIAC COMBINATION PILL)  3. Do they need a 30 day or 90 day supply? 90 ds;

## 2021-09-18 ENCOUNTER — Telehealth: Payer: Self-pay | Admitting: Pharmacy Technician

## 2021-09-18 DIAGNOSIS — Z596 Low income: Secondary | ICD-10-CM

## 2021-09-18 NOTE — Progress Notes (Addendum)
[facility] [facility])                                            [place] Team    09/18/2021  LYNDY RUSSMAN 02/07/1946 [id number]  Received both patient and provider portion(s) of patient assistance application(s) for generic Tresiba (insulin degludec).  Faxed completed application and required documents into [employer].   Davielle Lingelbach P. Fleda Pagel, [place]  [phone]

## 2021-10-10 ENCOUNTER — Telehealth: Payer: Self-pay

## 2021-10-10 ENCOUNTER — Telehealth: Payer: Self-pay | Admitting: Neurology

## 2021-10-10 MED ORDER — NORTRIPTYLINE HCL 10 MG PO CAPS: ORAL_CAPSULE | ORAL | 1 refills | Status: DC

## 2021-10-10 NOTE — Telephone Encounter (Signed)
Called patient and informed her that we received an approval letter for her gabapentin this morning from 1/23-12/23. Informed patient that if she has any other issues with her gabapentin to please let me know.   Patient also requested a refill of nortriptyline. Patient aware I have sent in a ref[facility] for her.   Patient had no further questions or concerns.

## 2021-10-10 NOTE — Telephone Encounter (Signed)
New message    Received approval letter from [employer]  Gabapentin medication   Effective date  1.1.23 to  12.31.23

## 2021-10-10 NOTE — Telephone Encounter (Signed)
Patient called stating her Gabapentin is too expensive.  She is just wanting to follow up.

## 2021-10-22 ENCOUNTER — Telehealth: Payer: Self-pay | Admitting: Pharmacy Technician

## 2021-10-22 DIAGNOSIS — Z596 Low income: Secondary | ICD-10-CM

## 2021-10-22 NOTE — Progress Notes (Signed)
[facility])                                            [place] Team    10/22/2021  Melanie [name] 02/09/1946 [id number]  Care coordination call placed to [place] in regard to generic Tresiba (insulin degludec).  Spoke to [place] who informs patient is APPROVED 10/21/21-09/14/22. She informs medication will auto fill and ship out based on last fill date in 2022 and going forward in 2023 with delivery to the provider's office.  Erion Weightman P. Kana Reimann, [place]  [phone]

## 2021-10-25 ENCOUNTER — Telehealth: Payer: Self-pay | Admitting: Neurology

## 2021-10-25 MED ORDER — NORTRIPTYLINE HCL 10 MG PO CAPS: ORAL_CAPSULE | ORAL | 1 refills | Status: DC

## 2021-10-25 NOTE — Telephone Encounter (Signed)
1. Which medications need refilled? (List name and dosage, if known) nortriptyline 10 MG  2. Which pharmacy/location is medication to be sent to? (include street and city if local pharmacy) [place]  3. Do they need a 30 day or 90 day supply? 90 day supply  Patient is out of the medication and needs help.

## 2021-10-25 NOTE — Telephone Encounter (Signed)
Pt called informed that we had sent a script 1/26 but she stated she has not gotten her medication a new script was sent in while on the phone with the pt to the mail in pharmacy. Pt asked if she needed it sent to a local pharmacy she said that it was free from mail pharmacy to please send there and if she gets bad she will call and have [place] send [facility] a local [facility] a few days to last her until the mail order gets to her,

## 2021-10-29 DIAGNOSIS — E538 Deficiency of other specified B group vitamins: Secondary | ICD-10-CM | POA: Diagnosis not present

## 2021-10-29 DIAGNOSIS — E785 Hyperlipidemia, unspecified: Secondary | ICD-10-CM | POA: Diagnosis not present

## 2021-10-29 DIAGNOSIS — M859 Disorder of bone density and structure, unspecified: Secondary | ICD-10-CM | POA: Diagnosis not present

## 2021-10-29 DIAGNOSIS — I10 Essential (primary) hypertension: Secondary | ICD-10-CM | POA: Diagnosis not present

## 2021-10-29 DIAGNOSIS — Z79899 Other long term (current) drug therapy: Secondary | ICD-10-CM | POA: Diagnosis not present

## 2021-10-29 DIAGNOSIS — N1831 Chronic kidney disease, stage 3a: Secondary | ICD-10-CM | POA: Diagnosis not present

## 2021-10-29 DIAGNOSIS — E1129 Type 2 diabetes mellitus with other diabetic kidney complication: Secondary | ICD-10-CM | POA: Diagnosis not present

## 2021-10-30 ENCOUNTER — Telehealth: Payer: Self-pay | Admitting: Neurology

## 2021-10-30 NOTE — Telephone Encounter (Signed)
Called patient and she stated that [facility] will not fill her Nortriptyline because she owes them $141.00 for the Gabapentin they filled in January. Patient stated that she doesn't know why she owes that money because the medication was approved for the tier reduction. She states that she doesn't have $141.00 and [facility] as they are refusing to send her Nortriptyline.   Patient stated she's just so confused. I informed patient that I would [facility] on her behalf and see what I can find out.   I informed patient that since she is out of medication I can send it to a local pharmacy and see is good rx can reduce the cost for her. I will call [facility] first and get more information and contact her back.

## 2021-10-30 NOTE — Telephone Encounter (Signed)
Patient called and stated that [facility] refuses to send her the medication Nortriptyline.  She has been without if for 1 week.

## 2021-10-30 NOTE — Telephone Encounter (Signed)
[facility] [place] [facility] who stated that patient has a Co-Pay of $141.00 for Gabapentin. She is not sure why. Ubaldo Glassing was unable to see it from her side and will need to transfer me over to the appropriate team to answer my questions.      Transferred from [place] to correct department that I need to speak to in regard to patients medication issues. Was on hold for 16 minutes and had to hang up due to other calls coming in. Will try back in a few.

## 2021-10-31 MED ORDER — NORTRIPTYLINE HCL 10 MG PO CAPS: ORAL_CAPSULE | ORAL | 0 refills | Status: DC

## 2021-10-31 NOTE — Telephone Encounter (Signed)
[facility] and spoke with Luna Kitchens who informed me that patients nortriptyline cannot be processed and sent out until the balance of $141.00 is paid. Informed Luna Kitchens that patient is not able to pay this amount at this time and has been without medication for a week. [place] then inform me that patient must pay the bill of $141.00 in order for medication to be mailed.    Called patient and informed her of above. Informed patient that they are not going to send her medication until she is able  to pay the balance off. I informed patient that we can [facility] and provide them with a good rx coupon which will cost her $7.96 for a 30 day supply. Patient was thankful for this. Informed pateint I will send rx and call pharmacy to provide them with coupon code since she does not know how to get online to do this. Patient is aware she can call me if she has any issues or questions as I am happy to help.  Rx for 30 days sent [facility]. Good Rx information to be provided: $ 7.96 $12.96 BIN PCN Group Member ID [id number] [place] [id number]

## 2021-10-31 NOTE — Telephone Encounter (Signed)
Called [facility] and provided them with Good Rx code. Code was applied and patients total came out to $7.96. Called patient and informed her of the good news. Patient thanked me for call and will pick up medication today.

## 2021-11-05 DIAGNOSIS — I480 Paroxysmal atrial fibrillation: Secondary | ICD-10-CM | POA: Diagnosis not present

## 2021-11-05 DIAGNOSIS — I129 Hypertensive chronic kidney disease with stage 1 through stage 4 chronic kidney disease, or unspecified chronic kidney disease: Secondary | ICD-10-CM | POA: Diagnosis not present

## 2021-11-05 DIAGNOSIS — Z1339 Encounter for screening examination for other mental health and behavioral disorders: Secondary | ICD-10-CM | POA: Diagnosis not present

## 2021-11-05 DIAGNOSIS — E538 Deficiency of other specified B group vitamins: Secondary | ICD-10-CM | POA: Diagnosis not present

## 2021-11-05 DIAGNOSIS — R82998 Other abnormal findings in urine: Secondary | ICD-10-CM | POA: Diagnosis not present

## 2021-11-05 DIAGNOSIS — E1149 Type 2 diabetes mellitus with other diabetic neurological complication: Secondary | ICD-10-CM | POA: Diagnosis not present

## 2021-11-05 DIAGNOSIS — Z23 Encounter for immunization: Secondary | ICD-10-CM | POA: Diagnosis not present

## 2021-11-05 DIAGNOSIS — G720 Drug-induced myopathy: Secondary | ICD-10-CM | POA: Diagnosis not present

## 2021-11-05 DIAGNOSIS — Z1331 Encounter for screening for depression: Secondary | ICD-10-CM | POA: Diagnosis not present

## 2021-11-05 DIAGNOSIS — Z794 Long term (current) use of insulin: Secondary | ICD-10-CM | POA: Diagnosis not present

## 2021-11-05 DIAGNOSIS — E1129 Type 2 diabetes mellitus with other diabetic kidney complication: Secondary | ICD-10-CM | POA: Diagnosis not present

## 2021-11-05 DIAGNOSIS — R69 Illness, unspecified: Secondary | ICD-10-CM | POA: Diagnosis not present

## 2021-11-05 DIAGNOSIS — N1831 Chronic kidney disease, stage 3a: Secondary | ICD-10-CM | POA: Diagnosis not present

## 2021-11-05 DIAGNOSIS — Z0000 Encounter for general adult medical examination without abnormal findings: Secondary | ICD-10-CM | POA: Diagnosis not present

## 2021-11-06 ENCOUNTER — Telehealth: Payer: Self-pay | Admitting: Cardiovascular Disease

## 2021-11-06 MED ORDER — PRALUENT 75 MG/ML ~~LOC~~ SOAJ: 1.0000 "pen " | SUBCUTANEOUS | 3 refills | Status: DC

## 2021-11-06 NOTE — Telephone Encounter (Signed)
Called and spoke with patient. The letter she received was from [employer]. I advised that this letter must have been about her Melanie [name] which her PCP must have gotten for her.  I advised that she had a grant for praluent but it expired in July. She said that was the last time she took Praluent but her PCP wants her back on. Advised  that we can renew that grant now.  CARD NO. [id number]   CARD STATUS Active   BIN [id number]   PCN [id number]   PC GROUP [id number]  Info called into pharmacy

## 2021-11-06 NOTE — Telephone Encounter (Signed)
Pt c/o medication issue:  1. Name of Medication: Alirocumab ([place]) 75 MG/ML SOAJ  2. How are you currently taking this medication (dosage and times per day)? Not currently taking  3. Are you having a reaction (difficulty breathing--STAT)? no  4. What is your medication issue? Patient states she got a grant for the medication and received a letter saying it was shipped to the office, but has not heard anything.

## 2021-11-08 ENCOUNTER — Other Ambulatory Visit: Payer: Self-pay | Admitting: Internal Medicine

## 2021-11-08 DIAGNOSIS — Z720 Tobacco use: Secondary | ICD-10-CM

## 2021-11-10 DIAGNOSIS — R112 Nausea with vomiting, unspecified: Secondary | ICD-10-CM | POA: Diagnosis not present

## 2021-11-10 DIAGNOSIS — I129 Hypertensive chronic kidney disease with stage 1 through stage 4 chronic kidney disease, or unspecified chronic kidney disease: Secondary | ICD-10-CM | POA: Diagnosis not present

## 2021-11-10 DIAGNOSIS — R062 Wheezing: Secondary | ICD-10-CM | POA: Diagnosis not present

## 2021-11-10 DIAGNOSIS — Z794 Long term (current) use of insulin: Secondary | ICD-10-CM | POA: Diagnosis not present

## 2021-11-10 DIAGNOSIS — N183 Chronic kidney disease, stage 3 unspecified: Secondary | ICD-10-CM | POA: Diagnosis not present

## 2021-11-10 DIAGNOSIS — J189 Pneumonia, unspecified organism: Secondary | ICD-10-CM | POA: Diagnosis not present

## 2021-11-10 DIAGNOSIS — E1122 Type 2 diabetes mellitus with diabetic chronic kidney disease: Secondary | ICD-10-CM | POA: Diagnosis not present

## 2021-11-10 DIAGNOSIS — E1140 Type 2 diabetes mellitus with diabetic neuropathy, unspecified: Secondary | ICD-10-CM | POA: Diagnosis not present

## 2021-11-10 DIAGNOSIS — I959 Hypotension, unspecified: Secondary | ICD-10-CM | POA: Diagnosis not present

## 2021-11-10 DIAGNOSIS — I10 Essential (primary) hypertension: Secondary | ICD-10-CM | POA: Diagnosis not present

## 2021-11-10 DIAGNOSIS — Z720 Tobacco use: Secondary | ICD-10-CM | POA: Diagnosis not present

## 2021-11-10 DIAGNOSIS — R69 Illness, unspecified: Secondary | ICD-10-CM | POA: Diagnosis not present

## 2021-11-10 DIAGNOSIS — I2510 Atherosclerotic heart disease of native coronary artery without angina pectoris: Secondary | ICD-10-CM | POA: Diagnosis not present

## 2021-11-10 DIAGNOSIS — R509 Fever, unspecified: Secondary | ICD-10-CM | POA: Diagnosis not present

## 2021-11-10 DIAGNOSIS — J9601 Acute respiratory failure with hypoxia: Secondary | ICD-10-CM | POA: Diagnosis not present

## 2021-11-10 DIAGNOSIS — J441 Chronic obstructive pulmonary disease with (acute) exacerbation: Secondary | ICD-10-CM | POA: Diagnosis not present

## 2021-11-10 DIAGNOSIS — Z20822 Contact with and (suspected) exposure to covid-19: Secondary | ICD-10-CM | POA: Diagnosis not present

## 2021-11-10 DIAGNOSIS — E785 Hyperlipidemia, unspecified: Secondary | ICD-10-CM | POA: Diagnosis not present

## 2021-11-10 DIAGNOSIS — I480 Paroxysmal atrial fibrillation: Secondary | ICD-10-CM | POA: Diagnosis not present

## 2021-11-10 DIAGNOSIS — R0602 Shortness of breath: Secondary | ICD-10-CM | POA: Diagnosis not present

## 2021-11-10 DIAGNOSIS — E119 Type 2 diabetes mellitus without complications: Secondary | ICD-10-CM | POA: Diagnosis not present

## 2021-11-12 DIAGNOSIS — Z794 Long term (current) use of insulin: Secondary | ICD-10-CM | POA: Diagnosis not present

## 2021-11-12 DIAGNOSIS — I2510 Atherosclerotic heart disease of native coronary artery without angina pectoris: Secondary | ICD-10-CM | POA: Diagnosis not present

## 2021-11-12 DIAGNOSIS — I10 Essential (primary) hypertension: Secondary | ICD-10-CM | POA: Diagnosis not present

## 2021-11-12 DIAGNOSIS — I129 Hypertensive chronic kidney disease with stage 1 through stage 4 chronic kidney disease, or unspecified chronic kidney disease: Secondary | ICD-10-CM | POA: Diagnosis not present

## 2021-11-12 DIAGNOSIS — J9601 Acute respiratory failure with hypoxia: Secondary | ICD-10-CM | POA: Diagnosis not present

## 2021-11-12 DIAGNOSIS — Z720 Tobacco use: Secondary | ICD-10-CM | POA: Diagnosis not present

## 2021-11-12 DIAGNOSIS — E785 Hyperlipidemia, unspecified: Secondary | ICD-10-CM | POA: Diagnosis not present

## 2021-11-12 DIAGNOSIS — I480 Paroxysmal atrial fibrillation: Secondary | ICD-10-CM | POA: Diagnosis not present

## 2021-11-12 DIAGNOSIS — E1122 Type 2 diabetes mellitus with diabetic chronic kidney disease: Secondary | ICD-10-CM | POA: Diagnosis not present

## 2021-11-12 DIAGNOSIS — J441 Chronic obstructive pulmonary disease with (acute) exacerbation: Secondary | ICD-10-CM | POA: Diagnosis not present

## 2021-11-12 DIAGNOSIS — N1831 Chronic kidney disease, stage 3a: Secondary | ICD-10-CM | POA: Diagnosis not present

## 2021-11-12 DIAGNOSIS — E119 Type 2 diabetes mellitus without complications: Secondary | ICD-10-CM | POA: Diagnosis not present

## 2021-11-12 DIAGNOSIS — R69 Illness, unspecified: Secondary | ICD-10-CM | POA: Diagnosis not present

## 2021-11-16 DIAGNOSIS — E785 Hyperlipidemia, unspecified: Secondary | ICD-10-CM | POA: Diagnosis not present

## 2021-11-16 DIAGNOSIS — J449 Chronic obstructive pulmonary disease, unspecified: Secondary | ICD-10-CM | POA: Diagnosis not present

## 2021-11-16 DIAGNOSIS — E1140 Type 2 diabetes mellitus with diabetic neuropathy, unspecified: Secondary | ICD-10-CM | POA: Diagnosis not present

## 2021-11-16 DIAGNOSIS — I480 Paroxysmal atrial fibrillation: Secondary | ICD-10-CM | POA: Diagnosis not present

## 2021-11-16 DIAGNOSIS — N183 Chronic kidney disease, stage 3 unspecified: Secondary | ICD-10-CM | POA: Diagnosis not present

## 2021-11-16 DIAGNOSIS — R69 Illness, unspecified: Secondary | ICD-10-CM | POA: Diagnosis not present

## 2021-11-16 DIAGNOSIS — J9601 Acute respiratory failure with hypoxia: Secondary | ICD-10-CM | POA: Diagnosis not present

## 2021-11-16 DIAGNOSIS — I2510 Atherosclerotic heart disease of native coronary artery without angina pectoris: Secondary | ICD-10-CM | POA: Diagnosis not present

## 2021-11-16 DIAGNOSIS — E1122 Type 2 diabetes mellitus with diabetic chronic kidney disease: Secondary | ICD-10-CM | POA: Diagnosis not present

## 2021-11-16 DIAGNOSIS — J441 Chronic obstructive pulmonary disease with (acute) exacerbation: Secondary | ICD-10-CM | POA: Diagnosis not present

## 2021-11-16 DIAGNOSIS — I129 Hypertensive chronic kidney disease with stage 1 through stage 4 chronic kidney disease, or unspecified chronic kidney disease: Secondary | ICD-10-CM | POA: Diagnosis not present

## 2021-11-17 DIAGNOSIS — J449 Chronic obstructive pulmonary disease, unspecified: Secondary | ICD-10-CM | POA: Diagnosis not present

## 2021-11-20 ENCOUNTER — Encounter: Payer: Self-pay | Admitting: Emergency Medicine

## 2021-11-20 ENCOUNTER — Other Ambulatory Visit: Payer: Self-pay

## 2021-11-20 ENCOUNTER — Ambulatory Visit: Payer: Medicare HMO | Admitting: Emergency Medicine

## 2021-11-20 DIAGNOSIS — J449 Chronic obstructive pulmonary disease, unspecified: Secondary | ICD-10-CM | POA: Insufficient documentation

## 2021-11-20 DIAGNOSIS — Z720 Tobacco use: Secondary | ICD-10-CM

## 2021-11-20 DIAGNOSIS — H04123 Dry eye syndrome of bilateral lacrimal glands: Secondary | ICD-10-CM | POA: Diagnosis not present

## 2021-11-20 DIAGNOSIS — H524 Presbyopia: Secondary | ICD-10-CM | POA: Diagnosis not present

## 2021-11-20 DIAGNOSIS — E113293 Type 2 diabetes mellitus with mild nonproliferative diabetic retinopathy without macular edema, bilateral: Secondary | ICD-10-CM | POA: Diagnosis not present

## 2021-11-20 DIAGNOSIS — H2513 Age-related nuclear cataract, bilateral: Secondary | ICD-10-CM | POA: Diagnosis not present

## 2021-11-20 MED ORDER — DOXYCYCLINE HYCLATE 100 MG PO TABS: 100.0000 mg | ORAL_TABLET | Freq: Two times a day (BID) | ORAL | 0 refills | Status: DC

## 2021-11-20 MED ORDER — BREZTRI AEROSPHERE 160-9-4.8 MCG/ACT IN AERO: 2.0000 | INHALATION_SPRAY | Freq: Two times a day (BID) | RESPIRATORY_TRACT | 0 refills | Status: DC

## 2021-11-20 NOTE — Addendum Note (Signed)
Addended by: Gavin Potters R on: 11/20/2021 04:37 PM ? ? Modules accepted: Orders ? ?

## 2021-11-20 NOTE — Patient Instructions (Signed)
We will arrange for pulmonary function testing at your next office visit. ?Stop Breo for now.  We will try starting Breztri 2 puffs twice a day.  Rinse and gargle after using.  Keep track of whether this medication gives you more benefit.  If so then we will try to continue it going forward. ?Keep albuterol available to use 2 puffs up to every 4 hours if needed for shortness of breath, chest tightness, wheezing.  ?Take doxycycline 100 mg twice a day for 7 days ?Get your lung cancer screening CT later this month as planned.  We can review this next time. ?Follow with Dr. Lamonte Sakai next available with full pulmonary function testing on the same day.  ? ?

## 2021-11-20 NOTE — Progress Notes (Signed)
? ?Subjective:  ? ? Patient ID: Melanie [name], female    DOB: [birth year], 76 y.o.   MRN: [id number] ? ?HPI ?76 year old active smoker (40+ pack years) with a history of diabetes, hypertension, paroxysmal atrial fibrillation, depression.  She is referred today for evaluation following recent admission for PNA and suspected AE-COPD. She was admitted to [facility], required O2 but did not have to home on it. She was started on Breo and albuterol (both new). She has some exertional SOB even at baseline - esp w stairs. She is not back to baseline yet. She is still having some cough, prod of brownish / tan mucous. She hears wheeze at night depending on how she is laying. Using albuterol about 3x a day.  ? ?Participates in lung cancer screening program, most recent CT done on 11/19/2020 reviewed by me, was RADS 2 study, showed mild centrilobular and paraseptal emphysema with bronchial wall thickening, some scattered small pulmonary nodules largest 3.3 mm in the peripheral left upper lobe.  Repeat scan planned for 1 yr ? ? ?Review of Systems ?As per HPI ? ?Past Medical History:  ?Diagnosis Date  ? Arthritis   ? Bradycardia   ? Depression   ? under control  ? DM (diabetes mellitus) ([place])   ? type 2  ? Dyspnea   ? with some exertion  ? Family history of anesthesia complication   ? mother-nausea and vomiting  ? Family history of breast cancer   ? Family history of ovarian cancer   ? GERD (gastroesophageal reflux disease)   ? occasional  ? Headache   ? HTN (hypertension)   ? Neuropathy   ? legs and feet  ? Neuropathy   ? feet  ? PAF (paroxysmal atrial fibrillation) (HCC) 10 years ago  ? no problems since, due to stress  ? Situational stress   ? "was taking care of sick husband"  ?  ? ?Family History  ?Problem Relation Age of Onset  ? Heart attack Father   ?     Deceased, 71  ? Other Mother   ?     Deceased, 31  ? Neuropathy Mother   ? Hypertension Mother   ? Breast cancer Sister   ?     60's  ? Ovarian cancer Sister   ?      dx and died in 69's  ? Multiple sclerosis Sister   ? Neuropathy Sister   ? Stroke Sister   ? Hypertension Sister   ? Cancer Maternal Uncle   ?     dx >50, type unk  ? Stomach cancer Paternal Uncle   ?     dx >50  ? Cancer Maternal Uncle   ?     dx >50, type unk  ? Cancer Cousin 60  ?     type of cancer unk  ?  ? ?Social History  ? ?Socioeconomic History  ? Marital status: Widowed  ?  Spouse name: Not on file  ? Number of children: 2  ? Years of education: 83  ? Highest education level: 12th grade  ?Occupational History  ? Occupation: retired   ?Tobacco Use  ? Smoking status: Every Day  ?  Packs/day: 0.50  ?  Years: 30.00  ?  Pack years: 15.00  ?  Types: Cigarettes  ? Smokeless tobacco: Never  ? Tobacco comments:  ?  5 cigarettes smoked daily [facility] 11/20/21  ?Vaping Use  ? Vaping Use: Never used  ?Substance and Sexual  Activity  ? Alcohol use: No  ?  Alcohol/week: 0.0 standard drinks  ? Drug use: No  ? Sexual activity: Not Currently  ?Other Topics Concern  ? Not on file  ?Social History Narrative  ? Lives alone. Has two supportive sons and is close to her family. Retired from working in [employer]. One story home  ? Right handed   ? Drinks Caffeine   ?   ? ?Social Determinants of Health  ? ?Financial Resource Strain: Not on file  ?Food Insecurity: No Food Insecurity  ? Worried About Charity fundraiser in the Last Year: Never true  ? Ran Out of Food in the Last Year: Never true  ?Transportation Needs: No Transportation Needs  ? Lack of Transportation (Medical): No  ? Lack of Transportation (Non-Medical): No  ?Physical Activity: Not on file  ?Stress: Not on file  ?Social Connections: Not on file  ?Intimate Partner Violence: Not on file  ?  ?From [place], has lived in [place], [place] ?Has done hosiery mill work ? ? ?Allergies  ?Allergen Reactions  ? Fenofibrate Other (See Comments)  ?  "muscle spasms"  ? Rosuvastatin   ?  Muscle aches  ? Metoprolol Other (See Comments)  ?  Headache  ?  ? ?Outpatient Medications Prior to Visit   ?Medication Sig Dispense Refill  ? albuterol (VENTOLIN HFA) 108 (90 Base) MCG/ACT inhaler 1 puff as needed    ? Alirocumab (PRALUENT) 75 MG/ML SOAJ Inject 1 pen into the skin every 14 (fourteen) days. 6 mL 3  ? amLODipine (NORVASC) 5 MG tablet Take 5 mg by mouth daily.    ? anastrozole (ARIMIDEX) 1 MG tablet Take 1 tablet (1 mg total) by mouth daily. 90 tablet 6  ? aspirin EC 81 MG tablet Take 81 mg by mouth daily.     ? bisoprolol (ZEBETA) 5 MG tablet TAKE 1 TABLET DAILY 90 tablet 3  ? Cyanocobalamin (VITAMIN B-12 IJ) Inject 1,000 Units as directed every 30 (thirty) days.    ? fluticasone furoate-vilanterol (BREO ELLIPTA) 100-25 MCG/ACT AEPB 1 puff    ? gabapentin (NEURONTIN) 300 MG capsule TAKE 3 CAPSULES 3 TIMES A  DAY 810 capsule 3  ? glipiZIDE (GLUCOTROL) 5 MG tablet Take 5 mg by mouth 2 (two) times daily before a meal.     ? insulin degludec (TRESIBA) 100 UNIT/ML FlexTouch Pen Inject into the skin.    ? lisinopril (ZESTRIL) 20 MG tablet TAKE 1 TABLET DAILY 90 tablet 3  ? lisinopril (ZESTRIL) 40 MG tablet Take 20 mg by mouth daily.    ? metFORMIN (GLUCOPHAGE) 1000 MG tablet Take 1,000 mg by mouth 2 (two) times daily.    ? nitroGLYCERIN (NITROSTAT) 0.4 MG SL tablet Place 1 tablet (0.4 mg total) under the tongue every 5 (five) minutes as needed. 25 tablet 3  ? nortriptyline (PAMELOR) 10 MG capsule TAKE 4 CAPSULES BY MOUTH AT BEDTIME 120 capsule 0  ? Omega-3 Fatty Acids (FISH OIL PO) Take 2,000 Units by mouth 2 (two) times daily.    ? sertraline (ZOLOFT) 50 MG tablet Take 50 mg by mouth at bedtime.     ? amLODipine (NORVASC) 2.5 MG tablet Take 1 tablet (2.5 mg total) by mouth daily. (Patient not taking: Reported on 08/28/2021) 90 tablet 3  ? hydrochlorothiazide (MICROZIDE) 12.5 MG capsule Take 1 capsule (12.5 mg total) by mouth daily. (Patient not taking: Reported on 09/11/2021) 90 capsule 3  ? HYDROcodone-acetaminophen (NORCO/VICODIN) 5-325 MG tablet Take 1-2 tablets  by mouth every 6 (six) hours as needed for  moderate pain. (Patient not taking: Reported on 09/11/2021) 30 tablet 0  ? ?No facility-administered medications prior to visit.  ? ? ? ? ?   ?Objective:  ? Physical Exam ? ?Vitals:  ? 11/20/21 1506  ?BP: 118/80  ?Pulse: 89  ?Temp: 98.6 ?F (37 ?C)  ?TempSrc: Oral  ?SpO2: 96%  ?Weight: 185 lb 3.2 oz (84 kg)  ?Height: '5\' 7"'$  (1.702 m)  ? ? ?Gen: Pleasant, overwt woman, in no distress,  normal affect ? ?ENT: No lesions,  mouth clear,  oropharynx clear, no postnasal drip ? ?Neck: No JVD, no stridor ? ?Lungs: No use of accessory muscles, distant, no wheeze on normal breath or on a forced exp ? ?Cardiovascular: RRR, heart sounds normal, no murmur or gallops, no peripheral edema ? ?Musculoskeletal: No deformities, no cyanosis or clubbing ? ?Neuro: alert, awake, non focal ? ?Skin: Warm, no lesions or rash ? ?   ?Assessment & Plan:  ?COPD (chronic obstructive pulmonary disease) ([place]) ?Symptoms and recent exacerbation all consistent with COPD and patient with a heavy tobacco history.  She was treated for an acute flare, still having bronchitic symptoms.  It sounds like she received azithromycin.  I would like to give her a course of doxycycline because she still symptomatic, has purulent mucus but no wheezing currently.  Hold off on corticosteroids. ?She needs pulmonary function testing.  I will try changing her ICS/LABA to [facility] to see if she gets more benefit.  Use albuterol as needed. ? ?Tobacco use ?She needs to work on cessation.  She has cut down.  She is due for lung cancer screening CT later this month.  We will review with her once completed. ? ? ?Baltazar Apo, MD, PhD ?11/20/2021, 3:34 PM ? Pulmonary and Critical Care ?[phone] or if no answer before 7:00PM call [phone] ?For any issues after 7:00PM please call eLink [phone] ? ? ?

## 2021-11-20 NOTE — Assessment & Plan Note (Signed)
She needs to work on cessation.  She has cut down.  She is due for lung cancer screening CT later this month.  We will review with her once completed. ?

## 2021-11-20 NOTE — Assessment & Plan Note (Signed)
Symptoms and recent exacerbation all consistent with COPD and patient with a heavy tobacco history.  She was treated for an acute flare, still having bronchitic symptoms.  It sounds like she received azithromycin.  I would like to give her a course of doxycycline because she still symptomatic, has purulent mucus but no wheezing currently.  Hold off on corticosteroids. ?She needs pulmonary function testing.  I will try changing her ICS/LABA to [facility] to see if she gets more benefit.  Use albuterol as needed. ?

## 2021-11-21 DIAGNOSIS — N1831 Chronic kidney disease, stage 3a: Secondary | ICD-10-CM | POA: Diagnosis not present

## 2021-11-21 DIAGNOSIS — Z720 Tobacco use: Secondary | ICD-10-CM | POA: Diagnosis not present

## 2021-11-21 DIAGNOSIS — N183 Chronic kidney disease, stage 3 unspecified: Secondary | ICD-10-CM | POA: Diagnosis not present

## 2021-11-21 DIAGNOSIS — I480 Paroxysmal atrial fibrillation: Secondary | ICD-10-CM | POA: Diagnosis not present

## 2021-11-21 DIAGNOSIS — E1129 Type 2 diabetes mellitus with other diabetic kidney complication: Secondary | ICD-10-CM | POA: Diagnosis not present

## 2021-11-21 DIAGNOSIS — E1122 Type 2 diabetes mellitus with diabetic chronic kidney disease: Secondary | ICD-10-CM | POA: Diagnosis not present

## 2021-11-21 DIAGNOSIS — R69 Illness, unspecified: Secondary | ICD-10-CM | POA: Diagnosis not present

## 2021-11-21 DIAGNOSIS — I2510 Atherosclerotic heart disease of native coronary artery without angina pectoris: Secondary | ICD-10-CM | POA: Diagnosis not present

## 2021-11-21 DIAGNOSIS — J9601 Acute respiratory failure with hypoxia: Secondary | ICD-10-CM | POA: Diagnosis not present

## 2021-11-21 DIAGNOSIS — E1140 Type 2 diabetes mellitus with diabetic neuropathy, unspecified: Secondary | ICD-10-CM | POA: Diagnosis not present

## 2021-11-21 DIAGNOSIS — I129 Hypertensive chronic kidney disease with stage 1 through stage 4 chronic kidney disease, or unspecified chronic kidney disease: Secondary | ICD-10-CM | POA: Diagnosis not present

## 2021-11-21 DIAGNOSIS — E781 Pure hyperglyceridemia: Secondary | ICD-10-CM | POA: Diagnosis not present

## 2021-11-21 DIAGNOSIS — J449 Chronic obstructive pulmonary disease, unspecified: Secondary | ICD-10-CM | POA: Diagnosis not present

## 2021-11-21 DIAGNOSIS — J441 Chronic obstructive pulmonary disease with (acute) exacerbation: Secondary | ICD-10-CM | POA: Diagnosis not present

## 2021-11-21 DIAGNOSIS — E785 Hyperlipidemia, unspecified: Secondary | ICD-10-CM | POA: Diagnosis not present

## 2021-11-22 DIAGNOSIS — E1122 Type 2 diabetes mellitus with diabetic chronic kidney disease: Secondary | ICD-10-CM | POA: Diagnosis not present

## 2021-11-22 DIAGNOSIS — J9601 Acute respiratory failure with hypoxia: Secondary | ICD-10-CM | POA: Diagnosis not present

## 2021-11-22 DIAGNOSIS — I2510 Atherosclerotic heart disease of native coronary artery without angina pectoris: Secondary | ICD-10-CM | POA: Diagnosis not present

## 2021-11-22 DIAGNOSIS — N183 Chronic kidney disease, stage 3 unspecified: Secondary | ICD-10-CM | POA: Diagnosis not present

## 2021-11-22 DIAGNOSIS — R69 Illness, unspecified: Secondary | ICD-10-CM | POA: Diagnosis not present

## 2021-11-22 DIAGNOSIS — E1140 Type 2 diabetes mellitus with diabetic neuropathy, unspecified: Secondary | ICD-10-CM | POA: Diagnosis not present

## 2021-11-22 DIAGNOSIS — E785 Hyperlipidemia, unspecified: Secondary | ICD-10-CM | POA: Diagnosis not present

## 2021-11-22 DIAGNOSIS — J441 Chronic obstructive pulmonary disease with (acute) exacerbation: Secondary | ICD-10-CM | POA: Diagnosis not present

## 2021-11-22 DIAGNOSIS — I129 Hypertensive chronic kidney disease with stage 1 through stage 4 chronic kidney disease, or unspecified chronic kidney disease: Secondary | ICD-10-CM | POA: Diagnosis not present

## 2021-11-22 DIAGNOSIS — I480 Paroxysmal atrial fibrillation: Secondary | ICD-10-CM | POA: Diagnosis not present

## 2021-11-24 DIAGNOSIS — N183 Chronic kidney disease, stage 3 unspecified: Secondary | ICD-10-CM | POA: Diagnosis not present

## 2021-11-24 DIAGNOSIS — E1140 Type 2 diabetes mellitus with diabetic neuropathy, unspecified: Secondary | ICD-10-CM | POA: Diagnosis not present

## 2021-11-24 DIAGNOSIS — I2510 Atherosclerotic heart disease of native coronary artery without angina pectoris: Secondary | ICD-10-CM | POA: Diagnosis not present

## 2021-11-24 DIAGNOSIS — E785 Hyperlipidemia, unspecified: Secondary | ICD-10-CM | POA: Diagnosis not present

## 2021-11-24 DIAGNOSIS — E1122 Type 2 diabetes mellitus with diabetic chronic kidney disease: Secondary | ICD-10-CM | POA: Diagnosis not present

## 2021-11-24 DIAGNOSIS — I480 Paroxysmal atrial fibrillation: Secondary | ICD-10-CM | POA: Diagnosis not present

## 2021-11-24 DIAGNOSIS — I129 Hypertensive chronic kidney disease with stage 1 through stage 4 chronic kidney disease, or unspecified chronic kidney disease: Secondary | ICD-10-CM | POA: Diagnosis not present

## 2021-11-24 DIAGNOSIS — J9601 Acute respiratory failure with hypoxia: Secondary | ICD-10-CM | POA: Diagnosis not present

## 2021-11-24 DIAGNOSIS — R69 Illness, unspecified: Secondary | ICD-10-CM | POA: Diagnosis not present

## 2021-11-24 DIAGNOSIS — J441 Chronic obstructive pulmonary disease with (acute) exacerbation: Secondary | ICD-10-CM | POA: Diagnosis not present

## 2021-11-26 DIAGNOSIS — I480 Paroxysmal atrial fibrillation: Secondary | ICD-10-CM | POA: Diagnosis not present

## 2021-11-26 DIAGNOSIS — I2510 Atherosclerotic heart disease of native coronary artery without angina pectoris: Secondary | ICD-10-CM | POA: Diagnosis not present

## 2021-11-26 DIAGNOSIS — N183 Chronic kidney disease, stage 3 unspecified: Secondary | ICD-10-CM | POA: Diagnosis not present

## 2021-11-26 DIAGNOSIS — J441 Chronic obstructive pulmonary disease with (acute) exacerbation: Secondary | ICD-10-CM | POA: Diagnosis not present

## 2021-11-26 DIAGNOSIS — E1140 Type 2 diabetes mellitus with diabetic neuropathy, unspecified: Secondary | ICD-10-CM | POA: Diagnosis not present

## 2021-11-26 DIAGNOSIS — E785 Hyperlipidemia, unspecified: Secondary | ICD-10-CM | POA: Diagnosis not present

## 2021-11-26 DIAGNOSIS — E1122 Type 2 diabetes mellitus with diabetic chronic kidney disease: Secondary | ICD-10-CM | POA: Diagnosis not present

## 2021-11-26 DIAGNOSIS — J9601 Acute respiratory failure with hypoxia: Secondary | ICD-10-CM | POA: Diagnosis not present

## 2021-11-26 DIAGNOSIS — I129 Hypertensive chronic kidney disease with stage 1 through stage 4 chronic kidney disease, or unspecified chronic kidney disease: Secondary | ICD-10-CM | POA: Diagnosis not present

## 2021-11-26 DIAGNOSIS — R69 Illness, unspecified: Secondary | ICD-10-CM | POA: Diagnosis not present

## 2021-11-27 ENCOUNTER — Other Ambulatory Visit: Payer: Self-pay | Admitting: Neurology

## 2021-11-27 DIAGNOSIS — I129 Hypertensive chronic kidney disease with stage 1 through stage 4 chronic kidney disease, or unspecified chronic kidney disease: Secondary | ICD-10-CM | POA: Diagnosis not present

## 2021-11-27 DIAGNOSIS — I480 Paroxysmal atrial fibrillation: Secondary | ICD-10-CM | POA: Diagnosis not present

## 2021-11-27 DIAGNOSIS — E1140 Type 2 diabetes mellitus with diabetic neuropathy, unspecified: Secondary | ICD-10-CM | POA: Diagnosis not present

## 2021-11-27 DIAGNOSIS — N183 Chronic kidney disease, stage 3 unspecified: Secondary | ICD-10-CM | POA: Diagnosis not present

## 2021-11-27 DIAGNOSIS — J441 Chronic obstructive pulmonary disease with (acute) exacerbation: Secondary | ICD-10-CM | POA: Diagnosis not present

## 2021-11-27 DIAGNOSIS — J9601 Acute respiratory failure with hypoxia: Secondary | ICD-10-CM | POA: Diagnosis not present

## 2021-11-27 DIAGNOSIS — I2510 Atherosclerotic heart disease of native coronary artery without angina pectoris: Secondary | ICD-10-CM | POA: Diagnosis not present

## 2021-11-27 DIAGNOSIS — E1122 Type 2 diabetes mellitus with diabetic chronic kidney disease: Secondary | ICD-10-CM | POA: Diagnosis not present

## 2021-11-27 DIAGNOSIS — R69 Illness, unspecified: Secondary | ICD-10-CM | POA: Diagnosis not present

## 2021-11-27 DIAGNOSIS — E785 Hyperlipidemia, unspecified: Secondary | ICD-10-CM | POA: Diagnosis not present

## 2021-12-02 ENCOUNTER — Other Ambulatory Visit: Payer: Self-pay

## 2021-12-02 ENCOUNTER — Ambulatory Visit
Admission: RE | Admit: 2021-12-02 | Discharge: 2021-12-02 | Disposition: A | Discharge status: Home / Self Care | Payer: Medicare HMO | Source: Ambulatory Visit | Attending: Internal Medicine | Admitting: Internal Medicine

## 2021-12-02 DIAGNOSIS — Z720 Tobacco use: Secondary | ICD-10-CM

## 2021-12-02 DIAGNOSIS — R69 Illness, unspecified: Secondary | ICD-10-CM | POA: Diagnosis not present

## 2021-12-02 DIAGNOSIS — F17210 Nicotine dependence, cigarettes, uncomplicated: Secondary | ICD-10-CM | POA: Diagnosis not present

## 2021-12-03 ENCOUNTER — Telehealth: Payer: Self-pay | Admitting: Emergency Medicine

## 2021-12-03 ENCOUNTER — Telehealth: Payer: Self-pay | Admitting: Neurology

## 2021-12-03 NOTE — Telephone Encounter (Signed)
Agree with stopping the Breztri. Please have her cxall [place] back next 1-2 days to let us know if this is resolving. If worsening then we need to see her asap or she needs to go to urgent care ?

## 2021-12-03 NOTE — Telephone Encounter (Signed)
Spoke to patient and relayed below message/recommendations. She voiced her understanding.  ?She will call back with update.  ?Nothing further needed.  ? ? ?

## 2021-12-03 NOTE — Telephone Encounter (Signed)
Patient called requesting to speak with Melanie [name] about her nortriptyline.  ? ?She said [facility] has her prescriptions all mixed up. ? ?She'd like another one month prescription sent into a local pharmacy for this month while they are sorting it out. ? ?[facility] on [address] in [place] ? ?

## 2021-12-03 NOTE — Telephone Encounter (Signed)
Lm for Levada Dy with [employer].  ? ?Spoke to patient. She stated that she was given a sample of [facility] 11/20/2021. She has since developed face redness and burning/itching. She has developed a sore on her left eyelid from rubbing. ?Sx developed the same day that she started breztri.  ?She last used Breztri this morning. Advised patient to hold Breztri until receiving a response from Dr. Lamonte Sakai. ?Denied f/c/s, wheezing, increased sob or additional sx.  ? ?Dr. Lamonte Sakai, please advise. thanks ?

## 2021-12-04 NOTE — Telephone Encounter (Signed)
Called patient and she stated that she has picked up her Nortriptyline and everything is good. She thanked me for calling and checking and had no other questions or concerns.  ?

## 2021-12-04 NOTE — Telephone Encounter (Signed)
Nortriptyline '10mg'$  tab - take 4 at bedtime #120 tablets was sent to her [place] on 11/28/2021.  Recommend she contact the pharmacy as they have recent prescription.  ?

## 2021-12-05 ENCOUNTER — Telehealth: Payer: Self-pay | Admitting: Emergency Medicine

## 2021-12-05 ENCOUNTER — Other Ambulatory Visit: Payer: Self-pay | Admitting: *Deleted

## 2021-12-05 DIAGNOSIS — I129 Hypertensive chronic kidney disease with stage 1 through stage 4 chronic kidney disease, or unspecified chronic kidney disease: Secondary | ICD-10-CM | POA: Diagnosis not present

## 2021-12-05 DIAGNOSIS — E1140 Type 2 diabetes mellitus with diabetic neuropathy, unspecified: Secondary | ICD-10-CM | POA: Diagnosis not present

## 2021-12-05 DIAGNOSIS — E785 Hyperlipidemia, unspecified: Secondary | ICD-10-CM | POA: Diagnosis not present

## 2021-12-05 DIAGNOSIS — I480 Paroxysmal atrial fibrillation: Secondary | ICD-10-CM | POA: Diagnosis not present

## 2021-12-05 DIAGNOSIS — I2510 Atherosclerotic heart disease of native coronary artery without angina pectoris: Secondary | ICD-10-CM | POA: Diagnosis not present

## 2021-12-05 DIAGNOSIS — E1122 Type 2 diabetes mellitus with diabetic chronic kidney disease: Secondary | ICD-10-CM | POA: Diagnosis not present

## 2021-12-05 DIAGNOSIS — N183 Chronic kidney disease, stage 3 unspecified: Secondary | ICD-10-CM | POA: Diagnosis not present

## 2021-12-05 DIAGNOSIS — J441 Chronic obstructive pulmonary disease with (acute) exacerbation: Secondary | ICD-10-CM | POA: Diagnosis not present

## 2021-12-05 DIAGNOSIS — J9601 Acute respiratory failure with hypoxia: Secondary | ICD-10-CM | POA: Diagnosis not present

## 2021-12-05 DIAGNOSIS — R69 Illness, unspecified: Secondary | ICD-10-CM | POA: Diagnosis not present

## 2021-12-05 NOTE — Patient Outreach (Signed)
[facility]) Care Management ? ?12/05/2021 ? ?Melanie [name] ?1946/02/10 ?[id number] ? ?[facility]) Care Management ?RN Health Coach Note ? ? ?12/05/2021 ?Name:  Melanie [name] MRN:  [id number] DOB:  [birth year] ? ?Summary: ?Patient reports that she was admitted to the Hospital on 11/10/21 due to pneumonia and she has been diagnosed with COPD. Nurse provided COPD education and will send patient a COPD education packet. Patient states that the [facility] inhaler caused her face to turn red and itch. Dr. Lamonte Sakai was notified and she stopped the medication as instructed. Her allergic symptoms have improved. Nurse called Dr. Agustina Caroli office to notify as requested and asked would the provider want the patient to begin taking again the [facility] inhaler she received upon her discharge. Patient explained that her breathing is better and that she is working with [facility] PT to gain strength and mobility. Patient states her blood sugar ranges are 120-170 and that she continues to participate with sending her daily B/P values to her PCP's office. Patient states her home environment is safe and that she is well supported by her family. Patient did not have any further questions or concerns today and did confirm that she has this nurse's contact number to call her if needed.  ? ?Recommendations/Changes made from today's visit: ?Limit  salt, sugar, and carbohydrates you eat ?Continue to work with PT and do PT exercises at home 5 times a week ?Continue to participate in home B/P monitoring program connected to Dr. Brigitte Pulse ?Call 1-800-QUIT-NOW ([phone]) for assistance to quit smoking ?Review COPD education packet and call nurse for any questions or concerns. ? ?Subjective: ?Melanie [name] is an 76 y.o. year old female who is a primary patient of Ginger Organ., MD. The care management team was consulted for assistance with care management and/or care coordination needs.   ? ?RN Health Coach  completed Telephone Visit today.  ? ?Objective: ? ?Medications Reviewed Today   ? ? Reviewed by Michiel Cowboy, RN (Registered Nurse) on 12/05/21 at 74  Med List Status: <None>  ? ?Medication Order Taking? Sig Documenting Provider Last Dose Status Informant  ?albuterol (VENTOLIN HFA) 108 (90 Base) MCG/ACT inhaler [id number] Yes 1 puff as needed [provider] Taking Active   ?Alirocumab (PRALUENT) 75 MG/ML SOAJ [id number] Yes Inject 1 pen into the skin every 14 (fourteen) days. Nahser, Wonda Cheng, MD Taking Active   ?amLODipine (NORVASC) 2.5 MG tablet [id number] No Take 1 tablet (2.5 mg total) by mouth daily.  ?Patient not taking: Reported on 08/28/2021  ? Nahser, Wonda Cheng, MD Not Taking Active   ?amLODipine (NORVASC) 5 MG tablet [id number] Yes Take 5 mg by mouth daily. [provider] Taking Active   ?anastrozole (ARIMIDEX) 1 MG tablet [id number] Yes Take 1 tablet (1 mg total) by mouth daily. Magrinat, [name], MD Taking Active   ?aspirin EC 81 MG tablet [id number] Yes Take 81 mg by mouth daily.  [provider] Taking Active Self  ?bisoprolol (ZEBETA) 5 MG tablet [id number] Yes TAKE 1 TABLET DAILY Nahser, Wonda Cheng, MD Taking Active   ?Budeson-Glycopyrrol-Formoterol (BREZTRI AEROSPHERE) 160-9-4.8 MCG/ACT AERO [id number] No Inhale 2 puffs into the lungs in the morning and at bedtime.  ?Patient not taking: Reported on 12/05/2021  ? Collene Gobble, MD Not Taking Active   ?         ?Med Note [name], Kindsey Eblin A   Thu Dec 05, 2021 11:09 AM) Allergic reaction  ?Cyanocobalamin (VITAMIN B-12  IJ) [id number] Yes Inject 1,000 Units as directed every 30 (thirty) days. [provider] Taking Active   ?doxycycline (VIBRA-TABS) 100 MG tablet [id number] No Take 1 tablet (100 mg total) by mouth 2 (two) times daily.  ?Patient not taking: Reported on 12/05/2021  ? Collene Gobble, MD Not Taking Active   ?         ?Med Note [name], Sheamus Hasting A   Thu Dec 05, 2021 11:10 AM) completed  ?fluticasone furoate-vilanterol (BREO  ELLIPTA) 100-25 MCG/ACT AEPB [id number] No 1 puff  ?Patient not taking: Reported on 12/05/2021  ? [provider] Not Taking Active   ?gabapentin (NEURONTIN) 300 MG capsule [id number] Yes TAKE 3 CAPSULES 3 TIMES A  DAY Patel, Donika K, DO Taking Active   ?glipiZIDE (GLUCOTROL) 5 MG tablet [id number] Yes Take 5 mg by mouth 2 (two) times daily before a meal.  [provider] Taking Active Self  ?hydrochlorothiazide (MICROZIDE) 12.5 MG capsule [id number] No Take 1 capsule (12.5 mg total) by mouth daily.  ?Patient not taking: Reported on 09/11/2021  ? Nahser, Wonda Cheng, MD Not Taking Active   ?         ?Med Note [name], Ajdin Macke A   Fri Jun 21, 2021 10:08 AM) Stopped due to low sodium  ?HYDROcodone-acetaminophen (NORCO/VICODIN) 5-325 MG tablet [id number] No Take 1-2 tablets by mouth every 6 (six) hours as needed for moderate pain.  ?Patient not taking: Reported on 09/11/2021  ? [name], MD Not Taking Active   ?         ?Med Note [name], Burley Kopka A   Fri Jun 21, 2021 10:17 AM) completed  ?insulin degludec (TRESIBA) 100 UNIT/ML FlexTouch Pen [id number] Yes Inject into the skin. [provider] Taking Active   ?lisinopril (ZESTRIL) 20 MG tablet [id number] Yes TAKE 1 TABLET DAILY Nahser, Wonda Cheng, MD Taking Active   ?lisinopril (ZESTRIL) 40 MG tablet [id number] Yes Take 20 mg by mouth daily. [provider] Taking Active   ?metFORMIN (GLUCOPHAGE) 1000 MG tablet [id number] Yes Take 1,000 mg by mouth 2 (two) times daily. [provider] Taking Active Self  ?nitroGLYCERIN (NITROSTAT) 0.4 MG SL tablet [id number] Yes Place 1 tablet (0.4 mg total) under the tongue every 5 (five) minutes as needed. Richardson Dopp T, PA-C Taking Active   ?nortriptyline (PAMELOR) 10 MG capsule [id number] Yes TAKE 4 CAPSULES BY MOUTH AT BEDTIME Narda Amber K, DO Taking Active   ?Omega-3 Fatty Acids (FISH OIL PO) [id number] Yes Take 2,000 Units by mouth 2 (two) times daily. [provider] Taking Active   ?sertraline  (ZOLOFT) 50 MG tablet [id number] Yes Take 50 mg by mouth at bedtime.  [provider] Taking Active Self  ? ?  ?  ? ?  ? ? ? ?SDOH:  (Social Determinants of Health) assessments and interventions performed: SDOH assessments completed today and documented in the Epic system. ?  ? ?Care Plan ? ?Review of patient past medical history, allergies, medications, health status, including review of consultants reports, laboratory and other test data, was performed as part of comprehensive evaluation for care management services.  ? ?Care Plan : RN Care Manager Plan of Care  ?Updates made by Michiel Cowboy, RN since 12/05/2021 12:00 AM  ?  ? ?Problem: Knowledge Deficit Related to Diabetes and Hypertension   ?Priority: High  ?  ? ?Long-Range Goal: Development of Plan of Care for Management of Diabetes, COPD, and Hypertension   ?Start Date: 09/11/2021  ?Expected End Date:  09/13/2022  ?Priority: High  ?Note:   ?Current Barriers:  ?Knowledge Deficits related to plan of care for management of HTN, COPD, and DMII  ? ?RNCM Clinical Goal(s):  ?Patient will demonstrate Improved adherence to prescribed treatment plan for HTN, COPD, and DMII as evidenced by decreasing her A1c below 7 and maintaining B/P below 150/90; adhering to low sodium and diabetic diet; taking her medications as prescribed, continuation of decreasing the amount she smokes and using inhalers as prescribed ?continue to work with RN Care Manager to address care management and care coordination needs related to  HTN, COPD, and DMII as evidenced by adherence to CM Team Scheduled appointments through collaboration with RN Care manager, provider, and care team.  ? ?Interventions: ?Inter-disciplinary care team collaboration (see longitudinal plan of care) ?Evaluation of current treatment plan related to  self management and patient's adherence to plan as established by provider ? ? ?Diabetes Interventions:  (Status:  Goal on track:  Yes.) Long Term Goal ?Assessed  patient's understanding of A1c goal: <7% ?Reviewed medications with patient and discussed importance of medication adherence ?Discussed plans with patient for ongoing care management follow up and provided patient w

## 2021-12-05 NOTE — Patient Instructions (Signed)
Visit Information ? ?Thank you for taking time to visit with me today. Please don't hesitate to contact me if I can be of assistance to you before our next scheduled telephone appointment. ? ?Following are the goals we discussed today:  ?Patient Goals/Self-Care Activities: ?Take all medications as prescribed ?Attend all scheduled provider appointments ?Call pharmacy for medication refills 3-7 days in advance of running out of medications ?Call provider office for new concerns or questions  ?schedule appointment with eye doctor ?check blood sugar at prescribed times: 1-2 times daily and contact PCP for concerning values ?check feet daily for cuts, sores or redness ?enter blood sugar readings and medication or insulin into daily log ?take the blood sugar log to all doctor visits ?set goal weight ?trim toenails straight across ?drink 6 to 8 glasses of water each day ?fill half of plate with vegetables ?limit fast food meals to no more than 1 per week ?manage portion size ?set a realistic goal ?wear comfortable, well-fitting shoes ?identify and remove indoor air pollutants ?listen for public air quality announcements every day ?eliminate symptom triggers at home ?follow rescue plan if symptoms flare-up ?check blood pressure daily ?take medications for blood pressure exactly as prescribed ?Limit the amount of salt, sugar, and carbohydrates you eat ?Continue to work with PT and do PT exercises at home 5 times a week ?Continue to participate in home B/P monitoring program connected to Dr. Brigitte Pulse ?Call 1-800-QUIT-NOW [phone]) for assistance to quit smoking ?Review COPD education packet and call nurse for any questions or concerns. ? ?The patient verbalized understanding of instructions, educational materials, and care plan provided today and agreed to receive a mailed copy of patient instructions, educational materials, and care plan.  ? ?Telephone follow up appointment with care management team member scheduled for:  June ? ?Emelia Loron RN, BSN ?Nurse Health Coach ?[place] ?[phone] ?[email or web address] ? ? ?  ?

## 2021-12-05 NOTE — Telephone Encounter (Signed)
States RB prescribed breztri on 3/8. Pt has developed rash and was told to stop the rash. Rash has improved since then. Also wanted to know if pt should go back on Breo since she had reaction to breztri. We are welcome to call pt or Melanie [name] back with response. Please advise.  ?

## 2021-12-06 DIAGNOSIS — N183 Chronic kidney disease, stage 3 unspecified: Secondary | ICD-10-CM | POA: Diagnosis not present

## 2021-12-06 DIAGNOSIS — I129 Hypertensive chronic kidney disease with stage 1 through stage 4 chronic kidney disease, or unspecified chronic kidney disease: Secondary | ICD-10-CM | POA: Diagnosis not present

## 2021-12-06 DIAGNOSIS — J9601 Acute respiratory failure with hypoxia: Secondary | ICD-10-CM | POA: Diagnosis not present

## 2021-12-06 DIAGNOSIS — E785 Hyperlipidemia, unspecified: Secondary | ICD-10-CM | POA: Diagnosis not present

## 2021-12-06 DIAGNOSIS — E1122 Type 2 diabetes mellitus with diabetic chronic kidney disease: Secondary | ICD-10-CM | POA: Diagnosis not present

## 2021-12-06 DIAGNOSIS — I2510 Atherosclerotic heart disease of native coronary artery without angina pectoris: Secondary | ICD-10-CM | POA: Diagnosis not present

## 2021-12-06 DIAGNOSIS — E1140 Type 2 diabetes mellitus with diabetic neuropathy, unspecified: Secondary | ICD-10-CM | POA: Diagnosis not present

## 2021-12-06 DIAGNOSIS — R69 Illness, unspecified: Secondary | ICD-10-CM | POA: Diagnosis not present

## 2021-12-06 DIAGNOSIS — J441 Chronic obstructive pulmonary disease with (acute) exacerbation: Secondary | ICD-10-CM | POA: Diagnosis not present

## 2021-12-06 DIAGNOSIS — I480 Paroxysmal atrial fibrillation: Secondary | ICD-10-CM | POA: Diagnosis not present

## 2021-12-06 NOTE — Telephone Encounter (Signed)
Called and spoke with patient. She verbalized understanding. She stated that she believes she does not need a refill but if she does, she will call us back.  ? ?I will remove the Breztri from her list.  ? ?Nothing further needed at time of call.  ?

## 2021-12-06 NOTE — Telephone Encounter (Signed)
Called and spoke with pt who states she began taking Breztri 3/9. States that a week ago she started noticing a rash on her face which she believes is due to the [place] as that is the newest medication that she started taking. ? ?Pt said she spoke with her case manager Sharee Pimple about the rash she had and Sharee Pimple told her to stop taking the [place]. ? ?Pt said she did stop taking the Breztri around 3/20 and said since stopping it, the rash has begun to go away. ? ?Due to this, pt wants to know if she can switch back to [facility]. ? ?Dr. Lamonte Sakai, please advise. ?

## 2021-12-06 NOTE — Telephone Encounter (Signed)
Yes, go back to the breo ?

## 2021-12-06 NOTE — Telephone Encounter (Signed)
Attempted to call pt to get more info about the rash that she developed when it actually first started happening compared to when she started taking the [place]. ? ? ?Unable to reach. Left message for her to return call. ?

## 2021-12-12 DIAGNOSIS — E538 Deficiency of other specified B group vitamins: Secondary | ICD-10-CM | POA: Diagnosis not present

## 2021-12-12 DIAGNOSIS — I480 Paroxysmal atrial fibrillation: Secondary | ICD-10-CM | POA: Diagnosis not present

## 2021-12-12 DIAGNOSIS — N183 Chronic kidney disease, stage 3 unspecified: Secondary | ICD-10-CM | POA: Diagnosis not present

## 2021-12-12 DIAGNOSIS — E785 Hyperlipidemia, unspecified: Secondary | ICD-10-CM | POA: Diagnosis not present

## 2021-12-12 DIAGNOSIS — J9601 Acute respiratory failure with hypoxia: Secondary | ICD-10-CM | POA: Diagnosis not present

## 2021-12-12 DIAGNOSIS — R69 Illness, unspecified: Secondary | ICD-10-CM | POA: Diagnosis not present

## 2021-12-12 DIAGNOSIS — J441 Chronic obstructive pulmonary disease with (acute) exacerbation: Secondary | ICD-10-CM | POA: Diagnosis not present

## 2021-12-12 DIAGNOSIS — I2510 Atherosclerotic heart disease of native coronary artery without angina pectoris: Secondary | ICD-10-CM | POA: Diagnosis not present

## 2021-12-12 DIAGNOSIS — I129 Hypertensive chronic kidney disease with stage 1 through stage 4 chronic kidney disease, or unspecified chronic kidney disease: Secondary | ICD-10-CM | POA: Diagnosis not present

## 2021-12-12 DIAGNOSIS — E1122 Type 2 diabetes mellitus with diabetic chronic kidney disease: Secondary | ICD-10-CM | POA: Diagnosis not present

## 2021-12-12 DIAGNOSIS — E1140 Type 2 diabetes mellitus with diabetic neuropathy, unspecified: Secondary | ICD-10-CM | POA: Diagnosis not present

## 2021-12-13 DIAGNOSIS — N1831 Chronic kidney disease, stage 3a: Secondary | ICD-10-CM | POA: Diagnosis not present

## 2021-12-13 DIAGNOSIS — E785 Hyperlipidemia, unspecified: Secondary | ICD-10-CM | POA: Diagnosis not present

## 2021-12-13 DIAGNOSIS — E1122 Type 2 diabetes mellitus with diabetic chronic kidney disease: Secondary | ICD-10-CM | POA: Diagnosis not present

## 2021-12-13 DIAGNOSIS — I129 Hypertensive chronic kidney disease with stage 1 through stage 4 chronic kidney disease, or unspecified chronic kidney disease: Secondary | ICD-10-CM | POA: Diagnosis not present

## 2021-12-18 DIAGNOSIS — N183 Chronic kidney disease, stage 3 unspecified: Secondary | ICD-10-CM | POA: Diagnosis not present

## 2021-12-18 DIAGNOSIS — I129 Hypertensive chronic kidney disease with stage 1 through stage 4 chronic kidney disease, or unspecified chronic kidney disease: Secondary | ICD-10-CM | POA: Diagnosis not present

## 2021-12-18 DIAGNOSIS — E1122 Type 2 diabetes mellitus with diabetic chronic kidney disease: Secondary | ICD-10-CM | POA: Diagnosis not present

## 2021-12-18 DIAGNOSIS — I2510 Atherosclerotic heart disease of native coronary artery without angina pectoris: Secondary | ICD-10-CM | POA: Diagnosis not present

## 2021-12-18 DIAGNOSIS — I480 Paroxysmal atrial fibrillation: Secondary | ICD-10-CM | POA: Diagnosis not present

## 2021-12-18 DIAGNOSIS — J9601 Acute respiratory failure with hypoxia: Secondary | ICD-10-CM | POA: Diagnosis not present

## 2021-12-18 DIAGNOSIS — E785 Hyperlipidemia, unspecified: Secondary | ICD-10-CM | POA: Diagnosis not present

## 2021-12-18 DIAGNOSIS — R69 Illness, unspecified: Secondary | ICD-10-CM | POA: Diagnosis not present

## 2021-12-18 DIAGNOSIS — J441 Chronic obstructive pulmonary disease with (acute) exacerbation: Secondary | ICD-10-CM | POA: Diagnosis not present

## 2021-12-18 DIAGNOSIS — E1140 Type 2 diabetes mellitus with diabetic neuropathy, unspecified: Secondary | ICD-10-CM | POA: Diagnosis not present

## 2021-12-23 ENCOUNTER — Telehealth: Payer: Self-pay | Admitting: Neurology

## 2021-12-23 NOTE — Telephone Encounter (Signed)
Melissa from [facility] called and stated the medication gabapentin and nortriptyline needs to be sent to [facility].  Her callback number is [phone]. ?

## 2021-12-24 MED ORDER — GABAPENTIN 300 MG PO CAPS: ORAL_CAPSULE | ORAL | 1 refills | Status: DC

## 2021-12-24 MED ORDER — NORTRIPTYLINE HCL 10 MG PO CAPS: ORAL_CAPSULE | ORAL | 1 refills | Status: DC

## 2021-12-24 NOTE — Telephone Encounter (Signed)
Rx sent as requested.

## 2021-12-25 ENCOUNTER — Other Ambulatory Visit: Payer: Self-pay | Admitting: *Deleted

## 2021-12-25 MED ORDER — ANASTROZOLE 1 MG PO TABS: 1.0000 mg | ORAL_TABLET | Freq: Every day | ORAL | 2 refills | Status: DC

## 2021-12-26 ENCOUNTER — Telehealth: Payer: Self-pay | Admitting: Neurology

## 2021-12-26 MED ORDER — GABAPENTIN 300 MG PO CAPS: ORAL_CAPSULE | ORAL | 1 refills | Status: DC

## 2021-12-26 MED ORDER — NORTRIPTYLINE HCL 10 MG PO CAPS: ORAL_CAPSULE | ORAL | 1 refills | Status: DC

## 2021-12-26 NOTE — Telephone Encounter (Signed)
Patient called and stated she needs a refill on Gabapentin and Nortriptyline.  She only has 2 days worth left.  [facility] ?

## 2021-12-26 NOTE — Telephone Encounter (Signed)
Called patient and informed her that her medication was sent to [facility] 12/24/2021 and it was sent successfully.  ? ?Resent medication and informed patient to contact me with any issues.  ?

## 2021-12-27 ENCOUNTER — Ambulatory Visit (INDEPENDENT_AMBULATORY_CARE_PROVIDER_SITE_OTHER): Payer: Medicare HMO | Admitting: Emergency Medicine

## 2021-12-27 ENCOUNTER — Encounter: Payer: Self-pay | Admitting: Emergency Medicine

## 2021-12-27 DIAGNOSIS — Z720 Tobacco use: Secondary | ICD-10-CM | POA: Diagnosis not present

## 2021-12-27 DIAGNOSIS — E1140 Type 2 diabetes mellitus with diabetic neuropathy, unspecified: Secondary | ICD-10-CM | POA: Diagnosis not present

## 2021-12-27 DIAGNOSIS — E1122 Type 2 diabetes mellitus with diabetic chronic kidney disease: Secondary | ICD-10-CM | POA: Diagnosis not present

## 2021-12-27 DIAGNOSIS — I2510 Atherosclerotic heart disease of native coronary artery without angina pectoris: Secondary | ICD-10-CM | POA: Diagnosis not present

## 2021-12-27 DIAGNOSIS — J9601 Acute respiratory failure with hypoxia: Secondary | ICD-10-CM | POA: Diagnosis not present

## 2021-12-27 DIAGNOSIS — N183 Chronic kidney disease, stage 3 unspecified: Secondary | ICD-10-CM | POA: Diagnosis not present

## 2021-12-27 DIAGNOSIS — J441 Chronic obstructive pulmonary disease with (acute) exacerbation: Secondary | ICD-10-CM | POA: Diagnosis not present

## 2021-12-27 DIAGNOSIS — E785 Hyperlipidemia, unspecified: Secondary | ICD-10-CM | POA: Diagnosis not present

## 2021-12-27 DIAGNOSIS — I129 Hypertensive chronic kidney disease with stage 1 through stage 4 chronic kidney disease, or unspecified chronic kidney disease: Secondary | ICD-10-CM | POA: Diagnosis not present

## 2021-12-27 DIAGNOSIS — J449 Chronic obstructive pulmonary disease, unspecified: Secondary | ICD-10-CM

## 2021-12-27 DIAGNOSIS — R69 Illness, unspecified: Secondary | ICD-10-CM | POA: Diagnosis not present

## 2021-12-27 DIAGNOSIS — I480 Paroxysmal atrial fibrillation: Secondary | ICD-10-CM | POA: Diagnosis not present

## 2021-12-27 LAB — PULMONARY FUNCTION TEST
DL/VA % pred: 90 %
DL/VA: 3.65 ml/min/mmHg/L
DLCO cor % pred: 72 %
DLCO cor: 15.52 ml/min/mmHg
DLCO unc % pred: 71 %
DLCO unc: 15.13 ml/min/mmHg
FEF 25-75 Post: 1.19 L/sec
FEF 25-75 Pre: 0.93 L/sec
FEF2575-%Change-Post: 27 %
FEF2575-%Pred-Post: 61 %
FEF2575-%Pred-Pre: 48 %
FEV1-%Change-Post: 5 %
FEV1-%Pred-Post: 63 %
FEV1-%Pred-Pre: 60 %
FEV1-Post: 1.57 L
FEV1-Pre: 1.49 L
FEV1FVC-%Change-Post: 3 %
FEV1FVC-%Pred-Pre: 95 %
FEV6-%Change-Post: 1 %
FEV6-%Pred-Post: 67 %
FEV6-%Pred-Pre: 66 %
FEV6-Post: 2.11 L
FEV6-Pre: 2.07 L
FEV6FVC-%Change-Post: 0 %
FEV6FVC-%Pred-Post: 104 %
FEV6FVC-%Pred-Pre: 104 %
FVC-%Change-Post: 2 %
FVC-%Pred-Post: 64 %
FVC-%Pred-Pre: 63 %
FVC-Post: 2.11 L
FVC-Pre: 2.07 L
Post FEV1/FVC ratio: 74 %
Post FEV6/FVC ratio: 100 %
Pre FEV1/FVC ratio: 72 %
Pre FEV6/FVC Ratio: 100 %
RV % pred: 115 %
RV: 2.76 L
TLC % pred: 89 %
TLC: 4.9 L

## 2021-12-27 NOTE — Assessment & Plan Note (Signed)
We reviewed your lung cancer screening CT scan of the chest.  This was a stable scan.  You will need another one in 12 months, March 2024. ?Continue to work on decreasing your smoking.  Goal will be to stop altogether.  We will try to help you with this ?

## 2021-12-27 NOTE — Patient Instructions (Signed)
We reviewed your pulmonary function testing today. ?We will try starting Stiolto 2 puffs once daily as a substitute for Breo.  Keep track of whether this Stiolto helps you.  If so call us and let us know so we can send a prescription to your pharmacy. ?Keep albuterol available to use 2 puffs up to every 4 hours if needed for shortness of breath, chest tightness, wheezing.  ?We reviewed your lung cancer screening CT scan of the chest.  This was a stable scan.  You will need another one in 12 months, March 2024. ?Continue to work on decreasing your smoking.  Goal will be to stop altogether.  We will try to help you with this ?Follow with Dr Lamonte Sakai in 6 months or sooner if you have any problems ? ?

## 2021-12-27 NOTE — Assessment & Plan Note (Signed)
We reviewed your pulmonary function testing today. ?We will try starting Stiolto 2 puffs once daily as a substitute for Breo.  Keep track of whether this Stiolto helps you.  If so call us and let us know so we can send a prescription to your pharmacy. ?Keep albuterol available to use 2 puffs up to every 4 hours if needed for shortness of breath, chest tightness, wheezing.  ?Follow with Dr Lamonte Sakai in 6 months or sooner if you have any problems ?

## 2021-12-27 NOTE — Progress Notes (Signed)
? ?Subjective:  ? ? Patient ID: Melanie [name], female    DOB: 17-May-1946, 76 y.o.   MRN: [id number] ? ?HPI ?76 year old active smoker (40+ pack years) with a history of diabetes, hypertension, paroxysmal atrial fibrillation, depression.  She is referred today for evaluation following recent admission for PNA and suspected AE-COPD. She was admitted to [facility], required O2 but did not have to home on it. She was started on Breo and albuterol (both new). She has some exertional SOB even at baseline - esp w stairs. She is not back to baseline yet. She is still having some cough, prod of brownish / tan mucous. She hears wheeze at night depending on how she is laying. Using albuterol about 3x a day.  ? ?Participates in lung cancer screening program, most recent CT done on 11/19/2020 reviewed by me, was RADS 2 study, showed mild centrilobular and paraseptal emphysema with bronchial wall thickening, some scattered small pulmonary nodules largest 3.3 mm in the peripheral left upper lobe.  Repeat scan planned for 1 yr ? ? ?ROV 12/27/21 --follow-up visit for 76 year old woman with history of tobacco use.  I saw her about 1 month ago for symptoms consistent with COPD and probable acute exacerbation.  I treated her with doxycycline for presumed bronchitis and changed her Breo to [place] to see if she would get more benefit. She was unable to take the [facility] - caused facial flushing and discomfort ? ?Lung cancer screening CT chest done 12/02/2021 reviewed by me shows some centrilobular emphysema, calcified left lower lobe granuloma, noncalcified pulmonary nodules scattered largest 4 mm.  RADS 2 study. ? ?Pulmonary function testing performed today and reviewed by me show spirometry with mixed obstruction and restriction with moderately severe decrease in FEV1, no bronchodilator response, normal lung volumes (suspect pseudonormalization), decreased diffusion capacity that corrects to the normal range when adjusted for alveolar  volume. ? ? ?Review of Systems ?As per HPI ? ?   ?Objective:  ? Physical Exam ? ?Vitals:  ? 12/27/21 1605  ?BP: 138/64  ?Pulse: 77  ?Temp: 97.9 ?F (36.6 ?C)  ?TempSrc: Oral  ?SpO2: 97%  ?Weight: 187 lb (84.8 kg)  ?Height: '5\' 7"'$  (1.702 m)  ? ? ?Gen: Pleasant, overwt woman, in no distress,  normal affect ? ?ENT: No lesions,  mouth clear,  oropharynx clear, no postnasal drip ? ?Neck: No JVD, no stridor ? ?Lungs: No use of accessory muscles, distant, no wheeze on normal breath or on a forced exp ? ?Cardiovascular: RRR, heart sounds normal, no murmur or gallops, no peripheral edema ? ?Musculoskeletal: No deformities, no cyanosis or clubbing ? ?Neuro: alert, awake, non focal ? ?Skin: Warm, no lesions or rash ? ?   ?Assessment & Plan:  ?COPD (chronic obstructive pulmonary disease) ([employer]) ?We reviewed your pulmonary function testing today. ?We will try starting Stiolto 2 puffs once daily as a substitute for Breo.  Keep track of whether this Stiolto helps you.  If so call us and let us know so we can send a prescription to your pharmacy. ?Keep albuterol available to use 2 puffs up to every 4 hours if needed for shortness of breath, chest tightness, wheezing.  ?Follow with Dr Lamonte Sakai in 6 months or sooner if you have any problems ? ?Tobacco use ?We reviewed your lung cancer screening CT scan of the chest.  This was a stable scan.  You will need another one in 12 months, March 2024. ?Continue to work on decreasing your smoking.  Goal will  be to stop altogether.  We will try to help you with this ? ? ?Baltazar Apo, MD, PhD ?12/27/2021, 4:20 PM ?[facility] ?[phone] or if no answer before 7:00PM call [phone] ?For any issues after 7:00PM please call eLink [phone] ? ? ?

## 2021-12-27 NOTE — Progress Notes (Signed)
Full PFT completed  

## 2022-01-01 ENCOUNTER — Other Ambulatory Visit: Payer: Self-pay | Admitting: Neurology

## 2022-01-12 DIAGNOSIS — I129 Hypertensive chronic kidney disease with stage 1 through stage 4 chronic kidney disease, or unspecified chronic kidney disease: Secondary | ICD-10-CM | POA: Diagnosis not present

## 2022-01-12 DIAGNOSIS — E785 Hyperlipidemia, unspecified: Secondary | ICD-10-CM | POA: Diagnosis not present

## 2022-01-12 DIAGNOSIS — N1831 Chronic kidney disease, stage 3a: Secondary | ICD-10-CM | POA: Diagnosis not present

## 2022-01-12 DIAGNOSIS — E1122 Type 2 diabetes mellitus with diabetic chronic kidney disease: Secondary | ICD-10-CM | POA: Diagnosis not present

## 2022-01-29 DIAGNOSIS — E538 Deficiency of other specified B group vitamins: Secondary | ICD-10-CM | POA: Diagnosis not present

## 2022-02-12 DIAGNOSIS — N1831 Chronic kidney disease, stage 3a: Secondary | ICD-10-CM | POA: Diagnosis not present

## 2022-02-12 DIAGNOSIS — E1122 Type 2 diabetes mellitus with diabetic chronic kidney disease: Secondary | ICD-10-CM | POA: Diagnosis not present

## 2022-02-12 DIAGNOSIS — I129 Hypertensive chronic kidney disease with stage 1 through stage 4 chronic kidney disease, or unspecified chronic kidney disease: Secondary | ICD-10-CM | POA: Diagnosis not present

## 2022-02-12 DIAGNOSIS — E785 Hyperlipidemia, unspecified: Secondary | ICD-10-CM | POA: Diagnosis not present

## 2022-02-19 ENCOUNTER — Other Ambulatory Visit: Payer: Self-pay | Admitting: *Deleted

## 2022-02-19 NOTE — Patient Outreach (Signed)
[facility]) Care Management  02/19/2022  Melanie [name] 29-Mar-1946 [id number]  [facility]) Care Management RN Health Coach Note   02/19/2022 Name:  Melanie [name] MRN:  [id number] DOB:  1946/06/22  Summary: Patient states her COPD has improved. Nurse discussed patient's inhalers and how to properly schedule and take them; she was provided Dr. Agustina Caroli office number to call and request stioloto prescription as requested by the provider. Patient reports that her blood sugar ranges are 130-<200 and that she continues to send her daily B/P values to her PCP's office. Patient states her home environment is safe and that she is well supported by her family. Patient did not have any further questions or concerns today and did confirm that she has this nurse's contact number to call her if needed.   Recommendations/Changes made from today's visit:  Call provider office for new concerns or questions  Limit the amount of salt, sugar, and carbohydrates you eat Continue to walk in driveway and do PT exercises routinely Continue to participate in home B/P monitoring program connected to Dr. Brigitte Pulse Call 1-800-QUIT-NOW [phone]) for assistance to quit smoking Cut back on the amount of cigarettes you smoke per day by placing 10 cigarettes on your table each day and smoking 10. Review COPD education packet and call nurse for any questions or concerns Call Dr. Agustina Caroli office to request prescription for Stiolto  Subjective: Melanie [name] is an 76 y.o. year old female who is a primary patient of Ginger Organ., MD. The care management team was consulted for assistance with care management and/or care coordination needs.    RN Health Coach completed Telephone Visit today.   Objective:  Medications Reviewed Today     Reviewed by Michiel Cowboy, RN (Registered Nurse) on 02/19/22 at 33  Med List Status: <None>   Medication Order Taking? Sig  Documenting Provider Last Dose Status Informant  albuterol (VENTOLIN HFA) 108 (90 Base) MCG/ACT inhaler [id number] Yes 1 puff as needed [provider] Taking Active   Alirocumab (PRALUENT) 75 MG/ML SOAJ [id number] Yes Inject 1 pen into the skin every 14 (fourteen) days. Nahser, Wonda Cheng, MD Taking Active   amLODipine (NORVASC) 5 MG tablet [id number] Yes Take 5 mg by mouth daily. [provider] Taking Active   anastrozole (ARIMIDEX) 1 MG tablet [id number] Yes Take 1 tablet (1 mg total) by mouth daily. Benay Pike, MD Taking Active   aspirin EC 81 MG tablet [id number] Yes Take 81 mg by mouth daily.  [provider] Taking Active Self  bisoprolol (ZEBETA) 5 MG tablet [id number] Yes TAKE 1 TABLET DAILY Nahser, Wonda Cheng, MD Taking Active   Cyanocobalamin (VITAMIN B-12 IJ) [id number] Yes Inject 1,000 Units as directed every 30 (thirty) days. [provider] Taking Active   DULoxetine (CYMBALTA) 60 MG capsule [id number] Yes Take 60 mg by mouth daily. [provider] Taking Active   gabapentin (NEURONTIN) 300 MG capsule [id number] Yes TAKE 3 CAPSULES 3 TIMES A  DAY Patel, Donika K, DO Taking Active   glipiZIDE (GLUCOTROL) 5 MG tablet [id number] Yes Take 5 mg by mouth 2 (two) times daily before a meal.  [provider] Taking Active Self  hydrochlorothiazide (MICROZIDE) 12.5 MG capsule [id number] No Take 1 capsule (12.5 mg total) by mouth daily.  Patient not taking: Reported on 02/19/2022   Nahser, Wonda Cheng, MD Not Taking Active            Med Note [name], JILL  A   Fri Jun 21, 2021 10:08 AM) Stopped due to low sodium  HYDROcodone-acetaminophen (NORCO/VICODIN) 5-325 MG tablet [id number] No Take 1-2 tablets by mouth every 6 (six) hours as needed for moderate pain.  Patient not taking: Reported on 09/11/2021   [name], MD Not Taking Active            Med Note [name], [name]   Fri Jun 21, 2021 10:17 AM) completed  insulin degludec (TRESIBA) 100 UNIT/ML FlexTouch  Pen [id number] Yes Inject into the skin. [provider] Taking Active   lisinopril (ZESTRIL) 20 MG tablet [id number] Yes TAKE 1 TABLET DAILY Nahser, Wonda Cheng, MD Taking Active   lisinopril (ZESTRIL) 40 MG tablet [id number] Yes Take 20 mg by mouth daily. [provider] Taking Active   metFORMIN (GLUCOPHAGE) 1000 MG tablet [id number] Yes Take 1,000 mg by mouth 2 (two) times daily. [provider] Taking Active Self  nitroGLYCERIN (NITROSTAT) 0.4 MG SL tablet [id number] Yes Place 1 tablet (0.4 mg total) under the tongue every 5 (five) minutes as needed. Richardson Dopp T, PA-C Taking Active   nortriptyline (PAMELOR) 10 MG capsule [id number] Yes TAKE 4 CAPSULES BY MOUTH AT BEDTIME [name], DO Taking Active              SDOH:  (Social Determinants of Health) assessments and interventions performed: SDOH assessments completed today and documented in the Epic system.    Care Plan  Review of patient past medical history, allergies, medications, health status, including review of consultants reports, laboratory and other test data, was performed as part of comprehensive evaluation for care management services.   Care Plan : RN Care Manager Plan of Care  Updates made by Michiel Cowboy, RN since 02/19/2022 12:00 AM     Problem: Knowledge Deficit Related to Diabetes and Hypertension   Priority: High     Long-Range Goal: Development of Plan of Care for Management of Diabetes, COPD, and Hypertension   Start Date: 09/11/2021  Expected End Date: 09/13/2022  Priority: High  Note:   Current Barriers:  Knowledge Deficits related to plan of care for management of HTN, COPD, and DMII   RNCM Clinical Goal(s):  Patient will demonstrate Improved adherence to prescribed treatment plan for HTN, COPD, and DMII as evidenced by decreasing her A1c below 7 and maintaining B/P below 150/90; adhering to low sodium and diabetic diet; taking her medications as prescribed, continuation of  decreasing the amount she smokes and using inhalers as prescribed continue to work with RN Care Manager to address care management and care coordination needs related to  HTN, COPD, and DMII as evidenced by adherence to CM Team Scheduled appointments through collaboration with RN Care manager, provider, and care team.   Interventions: Inter-disciplinary care team collaboration (see longitudinal plan of care) Evaluation of current treatment plan related to  self management and patient's adherence to plan as established by provider   Diabetes Interventions:  (Status:  Goal on track:  Yes.) Long Term Goal Assessed patient's understanding of A1c goal: <7% Current A1c 7.3 Reviewed medications with patient and discussed importance of medication adherence Discussed plans with patient for ongoing care management follow up and provided patient with direct contact information for care management team Provided patient with written educational materials related to hypo and hyperglycemia and importance of correct treatment Advised patient, providing education and rationale, to check cbg 1-2 and record, calling PCP for findings outside established parameters Discussed adhering to low sugar and  low carbohydrate diet Encouraged continuation of walking in driveway and doing PT exercises routinely   Hypertension Interventions:  (Status:  Goal on track:  Yes.) Long Term Goal Last practice recorded BP readings:  BP Readings from Last 3 Encounters:  08/28/21 (!) 142/74  08/19/21 (!) 157/73  06/24/21 (!) 146/75  Most recent eGFR/CrCl: No results found for: EGFR  No components found for: CRCL  Evaluation of current treatment plan related to hypertension self management and patient's adherence to plan as established by provider Reviewed medications with patient and discussed importance of compliance Discussed plans with patient for ongoing care management follow up and provided patient with direct contact  information for care management team Provided education on prescribed diet Dash diet Discussed complications of poorly controlled blood pressure such as heart disease, stroke, circulatory complications, vision complications, kidney impairment, sexual dysfunction Encouraged continuation of walking in driveway and doing PT exercises routinely Discussed continuation of remote B/P monitoring program connected to her PCP   COPD Interventions:  (Status:  New goal.) Long Term Goal Provided patient with basic written and verbal COPD education on self care/management/and exacerbation prevention Provided written and verbal instructions on pursed lip breathing and utilized returned demonstration as teach back Advised patient to self assesses COPD action plan zone and make appointment with provider if in the yellow zone for 48 hours without improvement Advised patient to engage in light exercise as tolerated 3-5 days a week to aid in the the management of COPD Provided education about and advised patient to utilize infection prevention strategies to reduce risk of respiratory infection Screening for signs and symptoms of depression related to chronic disease state  Assessed social determinant of health barriers Discussed smoking cessation; provided resource 1-800-quit-now Discussed decreasing the amount of cigarettes smoked per day to 10 or less Discussed prescribed inhalers, control versus rescue, control is daily dose, and the rescue is every 4 hours as needed. Provided Dr. Agustina Caroli office number for patient to call to request that Stiolto inhaler prescription be sent to her pharmacy  Patient Goals/Self-Care Activities: Take all medications as prescribed Attend all scheduled provider appointments Call pharmacy for medication refills 3-7 days in advance of running out of medications Call provider office for new concerns or questions  schedule appointment with eye doctor check blood sugar at prescribed  times: 1-2 times daily and contact PCP for concerning values check feet daily for cuts, sores or redness enter blood sugar readings and medication or insulin into daily log take the blood sugar log to all doctor visits set goal weight trim toenails straight across drink 6 to 8 glasses of water each day fill half of plate with vegetables limit fast food meals to no more than 1 per week manage portion size set a realistic goal wear comfortable, well-fitting shoes identify and remove indoor air pollutants listen for public air quality announcements every day eliminate symptom triggers at home follow rescue plan if symptoms flare-up check blood pressure daily take medications for blood pressure exactly as prescribed Limit the amount of salt, sugar, and carbohydrates you eat Continue to walk in driveway and do PT exercises routinely Continue to participate in home B/P monitoring program connected to Dr. Brigitte Pulse Call 1-800-QUIT-NOW 713-212-2064) for assistance to quit smoking Cut back on the amount of cigarettes you smoke per day by placing 10 cigarettes on your table each day and only smoking 10. Review COPD education packet and call nurse for any questions or concerns Call Dr. Agustina Caroli office to request prescription for  Stiolto   Follow Up Plan:  Telephone follow up appointment with care management team member scheduled for:  September       Plan: Telephone follow up appointment with care management team member scheduled for:  September. Nurse will send PCP today's assessment note.   Emelia Loron RN, [place] [phone] [email or web address]

## 2022-02-19 NOTE — Patient Instructions (Signed)
Visit Information  Thank you for taking time to visit with me today. Please don't hesitate to contact me if I can be of assistance to you before our next scheduled telephone appointment.  Following are the goals we discussed today:   Take all medications as prescribed Attend all scheduled provider appointments Call pharmacy for medication refills 3-7 days in advance of running out of medications Call provider office for new concerns or questions  schedule appointment with eye doctor check blood sugar at prescribed times: 1-2 times daily and contact PCP for concerning values check feet daily for cuts, sores or redness enter blood sugar readings and medication or insulin into daily log take the blood sugar log to all doctor visits set goal weight trim toenails straight across drink 6 to 8 glasses of water each day fill half of plate with vegetables limit fast food meals to no more than 1 per week manage portion size set a realistic goal wear comfortable, well-fitting shoes identify and remove indoor air pollutants listen for public air quality announcements every day eliminate symptom triggers at home follow rescue plan if symptoms flare-up check blood pressure daily take medications for blood pressure exactly as prescribed Limit the amount of salt, sugar, and carbohydrates you eat Continue to walk in driveway and do PT exercises routinely Continue to participate in home B/P monitoring program connected to Dr. Brigitte Pulse Call 1-800-QUIT-NOW ([phone]) for assistance to quit smoking Cut back on the amount of cigarettes you smoke per day by placing 10 cigarettes on your table each day and only smoking 10. Review COPD education packet and call nurse for any questions or concerns Call Dr. Agustina Caroli office to request prescription for [facility] [phone]   The patient verbalized understanding of instructions, educational materials, and care plan provided today and agreed to receive a  mailed copy of patient instructions, educational materials, and care plan.   Telephone follow up appointment with care management team member scheduled for: September  Emelia Loron RN, [place] [phone] [email or web address]

## 2022-02-26 ENCOUNTER — Telehealth: Payer: Self-pay | Admitting: Emergency Medicine

## 2022-02-28 MED ORDER — STIOLTO RESPIMAT 2.5-2.5 MCG/ACT IN AERS
2.0000 | INHALATION_SPRAY | Freq: Every day | RESPIRATORY_TRACT | 5 refills | Status: DC
Start: 2022-02-28 — End: 2024-06-08

## 2022-02-28 NOTE — Telephone Encounter (Signed)
Patient states needs RX for Stiolto inhaler. Pharmacy is [facility]. Patient phone number is [phone]. May leave detailed message.

## 2022-02-28 NOTE — Telephone Encounter (Signed)
Called and left voicemail for patient in regards to [facility] inhaler. Need to confirm pharmacy and medication with patient

## 2022-02-28 NOTE — Telephone Encounter (Signed)
Called patient but she did not answer. RX has been sent to pharmacy.

## 2022-03-03 DIAGNOSIS — G720 Drug-induced myopathy: Secondary | ICD-10-CM | POA: Diagnosis not present

## 2022-03-03 DIAGNOSIS — E785 Hyperlipidemia, unspecified: Secondary | ICD-10-CM | POA: Diagnosis not present

## 2022-03-03 DIAGNOSIS — I209 Angina pectoris, unspecified: Secondary | ICD-10-CM | POA: Diagnosis not present

## 2022-03-03 DIAGNOSIS — Z794 Long term (current) use of insulin: Secondary | ICD-10-CM | POA: Diagnosis not present

## 2022-03-03 DIAGNOSIS — Z1331 Encounter for screening for depression: Secondary | ICD-10-CM | POA: Diagnosis not present

## 2022-03-03 DIAGNOSIS — D0501 Lobular carcinoma in situ of right breast: Secondary | ICD-10-CM | POA: Diagnosis not present

## 2022-03-03 DIAGNOSIS — E538 Deficiency of other specified B group vitamins: Secondary | ICD-10-CM | POA: Diagnosis not present

## 2022-03-03 DIAGNOSIS — I700 Atherosclerosis of aorta: Secondary | ICD-10-CM | POA: Diagnosis not present

## 2022-03-03 DIAGNOSIS — E781 Pure hyperglyceridemia: Secondary | ICD-10-CM | POA: Diagnosis not present

## 2022-03-03 DIAGNOSIS — Z1339 Encounter for screening examination for other mental health and behavioral disorders: Secondary | ICD-10-CM | POA: Diagnosis not present

## 2022-03-03 DIAGNOSIS — N1831 Chronic kidney disease, stage 3a: Secondary | ICD-10-CM | POA: Diagnosis not present

## 2022-03-03 DIAGNOSIS — E1129 Type 2 diabetes mellitus with other diabetic kidney complication: Secondary | ICD-10-CM | POA: Diagnosis not present

## 2022-03-03 DIAGNOSIS — J449 Chronic obstructive pulmonary disease, unspecified: Secondary | ICD-10-CM | POA: Diagnosis not present

## 2022-03-03 DIAGNOSIS — I129 Hypertensive chronic kidney disease with stage 1 through stage 4 chronic kidney disease, or unspecified chronic kidney disease: Secondary | ICD-10-CM | POA: Diagnosis not present

## 2022-03-12 ENCOUNTER — Telehealth: Payer: Self-pay | Admitting: Emergency Medicine

## 2022-03-13 ENCOUNTER — Telehealth: Payer: Self-pay

## 2022-03-13 NOTE — Telephone Encounter (Signed)
Patient Advocate Encounter   Received notification that prior authorization for Stiolto Respimat 2.5-2.5MCG/ACT aerosol is required.   PA submitted on 03/13/2022 Key [id number] Status is pending

## 2022-03-13 NOTE — Telephone Encounter (Signed)
[facility] pharmacy is stating that the [employer] is requiring a Prior Auth to be done.   Can we get this started ladies.  Thank you

## 2022-03-14 DIAGNOSIS — E785 Hyperlipidemia, unspecified: Secondary | ICD-10-CM | POA: Diagnosis not present

## 2022-03-14 DIAGNOSIS — I129 Hypertensive chronic kidney disease with stage 1 through stage 4 chronic kidney disease, or unspecified chronic kidney disease: Secondary | ICD-10-CM | POA: Diagnosis not present

## 2022-03-14 DIAGNOSIS — N1831 Chronic kidney disease, stage 3a: Secondary | ICD-10-CM | POA: Diagnosis not present

## 2022-03-14 DIAGNOSIS — E1122 Type 2 diabetes mellitus with diabetic chronic kidney disease: Secondary | ICD-10-CM | POA: Diagnosis not present

## 2022-03-19 ENCOUNTER — Other Ambulatory Visit (HOSPITAL_COMMUNITY): Payer: Self-pay

## 2022-03-19 NOTE — Telephone Encounter (Signed)
Patient Advocate Encounter  Prior Authorization for Stiolto Respimat 2.5-2.5MCG/ACT aerosol has been approved.    PA key# [id number] Effective dates: 09/15/2021 through 09/14/2022  Patients co-pay is $232.97.

## 2022-04-03 ENCOUNTER — Other Ambulatory Visit: Payer: Self-pay | Admitting: *Deleted

## 2022-04-03 NOTE — Patient Outreach (Addendum)
[facility]) Care Management  04/03/2022  Melanie [name] 1946-08-15 [id number]  Patient has successfully completed her health and wellness goals.   Plan: RN Health Coach will close case.    Emelia Loron RN, BSN [place] [phone] [email or web address]

## 2022-04-08 ENCOUNTER — Telehealth: Payer: Self-pay | Admitting: Emergency Medicine

## 2022-04-08 MED ORDER — ALBUTEROL SULFATE HFA 108 (90 BASE) MCG/ACT IN AERS
INHALATION_SPRAY | RESPIRATORY_TRACT | 1 refills | Status: AC
Start: 2022-04-08 — End: ?

## 2022-04-08 NOTE — Telephone Encounter (Signed)
I called to let Melanie [name] know that the Albuterol was filled at [facility] pharmacy per patient request. Nothing further needed.

## 2022-04-10 DIAGNOSIS — M8589 Other specified disorders of bone density and structure, multiple sites: Secondary | ICD-10-CM | POA: Diagnosis not present

## 2022-04-10 DIAGNOSIS — E538 Deficiency of other specified B group vitamins: Secondary | ICD-10-CM | POA: Diagnosis not present

## 2022-04-14 DIAGNOSIS — E1122 Type 2 diabetes mellitus with diabetic chronic kidney disease: Secondary | ICD-10-CM | POA: Diagnosis not present

## 2022-04-14 DIAGNOSIS — E785 Hyperlipidemia, unspecified: Secondary | ICD-10-CM | POA: Diagnosis not present

## 2022-04-14 DIAGNOSIS — I129 Hypertensive chronic kidney disease with stage 1 through stage 4 chronic kidney disease, or unspecified chronic kidney disease: Secondary | ICD-10-CM | POA: Diagnosis not present

## 2022-04-14 DIAGNOSIS — N1831 Chronic kidney disease, stage 3a: Secondary | ICD-10-CM | POA: Diagnosis not present

## 2022-04-28 ENCOUNTER — Other Ambulatory Visit: Payer: Self-pay | Admitting: Emergency Medicine

## 2022-05-23 ENCOUNTER — Ambulatory Visit: Payer: Medicare HMO | Admitting: *Deleted

## 2022-05-29 ENCOUNTER — Other Ambulatory Visit: Payer: Self-pay | Admitting: Neurology

## 2022-05-29 DIAGNOSIS — E1129 Type 2 diabetes mellitus with other diabetic kidney complication: Secondary | ICD-10-CM | POA: Diagnosis not present

## 2022-05-29 DIAGNOSIS — I10 Essential (primary) hypertension: Secondary | ICD-10-CM | POA: Diagnosis not present

## 2022-05-29 DIAGNOSIS — E538 Deficiency of other specified B group vitamins: Secondary | ICD-10-CM | POA: Diagnosis not present

## 2022-06-04 ENCOUNTER — Other Ambulatory Visit: Payer: Self-pay | Admitting: Neurology

## 2022-06-04 ENCOUNTER — Telehealth: Payer: Self-pay | Admitting: Pharmacy Technician

## 2022-06-04 DIAGNOSIS — Z596 Low income: Secondary | ICD-10-CM

## 2022-06-04 NOTE — Progress Notes (Addendum)
[facility])                                            [place] Team    06/04/2022  LEATTA ALEWINE 04-29-1946 [id number]                                                                       Medication Assistance Referral  Referral From: [employer]  Medication/Company: Stiolto / BI for [id number] Patient application portion:  Mailed Provider application portion: Faxed  to Dr. Baltazar Apo Provider address/fax verified via: Office website  Medication/Company: Tyler Aas / Novo Nordisk for 2024 re enrollment Patient application portion:  Mailed with note to patient must be dated 06/29/22 or after Provider application portion: Faxed  to Dr. Marton Redwood on 06/30/22 Provider address/fax verified via: Office website                                         Medication/Company: Larna Daughters / Novo Nordisk for 3220 Patient application portion:  Mailed Provider application portion: Faxed  to Dr. Marton Redwood on 07/15/22 Provider address/fax verified via: Office website   Abb Gobert P. Malaak Stach, [place]  [phone]

## 2022-06-25 ENCOUNTER — Encounter: Payer: Self-pay | Admitting: Emergency Medicine

## 2022-06-25 ENCOUNTER — Other Ambulatory Visit: Payer: Self-pay | Admitting: Neurology

## 2022-06-25 ENCOUNTER — Ambulatory Visit: Payer: Medicare HMO | Admitting: Emergency Medicine

## 2022-06-25 DIAGNOSIS — J449 Chronic obstructive pulmonary disease, unspecified: Secondary | ICD-10-CM

## 2022-06-25 DIAGNOSIS — Z720 Tobacco use: Secondary | ICD-10-CM

## 2022-06-25 NOTE — Assessment & Plan Note (Signed)
Overall doing well without any exacerbations.  Good functional capacity.  She is been using her Stiolto 1 puff twice daily and I asked her to try changing it to 2 puffs daily to see if this works as well, possibly better.  Continue her albuterol as needed  Please continue Stiolto.  Start using 2 puffs every morning. Continue use your albuterol 2 puffs if needed for shortness of breath, chest tightness, wheezing. Get the flu shot this month as planned You would probably benefit from the COVID-19 vaccine if you would like to get this. Follow with Dr. Lamonte Sakai in 12 months or sooner if you have any problems.

## 2022-06-25 NOTE — Progress Notes (Signed)
Subjective:    Patient ID: Melanie [name], female    DOB: 11/29/1945, 76 y.o.   MRN: [id number]  HPI  ROV 12/27/21 --follow-up visit for 76 year old woman with history of tobacco use.  I saw her about 1 month ago for symptoms consistent with COPD and probable acute exacerbation.  I treated her with doxycycline for presumed bronchitis and changed her Breo to [place] to see if she would get more benefit. She was unable to take the [facility] - caused facial flushing and discomfort  Lung cancer screening CT chest done 12/02/2021 reviewed by me shows some centrilobular emphysema, calcified left lower lobe granuloma, noncalcified pulmonary nodules scattered largest 4 mm.  RADS 2 study.  Pulmonary function testing performed today and reviewed by me show spirometry with mixed obstruction and restriction with moderately severe decrease in FEV1, no bronchodilator response, normal lung volumes (suspect pseudonormalization), decreased diffusion capacity that corrects to the normal range when adjusted for alveolar volume.  ROV 06/25/22 --Ms. Melanie [name] is 75 with a history of active tobacco use (40+ pack years) and COPD.  Her spirometry shows mixed restriction and obstruction.  We have been managing her Stiolto.  She has albuterol which she uses approximately.  She participates in lung cancer screening program and is due for her repeat in March 2024. Today she reports that she has overall felt well, but did have more SOB during the high heat. She does the stiolto 1 puff bid. Albuterol use is now minimal now that the weather has cooled off. Minimal cough. She is able to exert, do her daily activities. Still smoking about 10 a day. No flares, no abx or pred.  Getting the flu shot w PCP   Review of Systems As per HPI      Objective:   Physical Exam  Vitals:   06/25/22 1312  BP: 132/70  Pulse: 78  Temp: 97.8 F (36.6 C)  TempSrc: Oral  SpO2: 97%  Weight: 184 lb 9.6 oz (83.7 kg)  Height: '5\' 7"'$  (1.702  m)    Gen: Pleasant, overwt woman, in no distress,  normal affect  ENT: No lesions,  mouth clear,  oropharynx clear, no postnasal drip  Neck: No JVD, no stridor  Lungs: No use of accessory muscles, distant, no wheeze on normal breath or on a forced exp  Cardiovascular: RRR, heart sounds normal, no murmur or gallops, no peripheral edema  Musculoskeletal: No deformities, no cyanosis or clubbing  Neuro: alert, awake, non focal  Skin: Warm, no lesions or rash     Assessment & Plan:  COPD (chronic obstructive pulmonary disease) (HCC) Overall doing well without any exacerbations.  Good functional capacity.  She is been using her Stiolto 1 puff twice daily and I asked her to try changing it to 2 puffs daily to see if this works as well, possibly better.  Continue her albuterol as needed  Please continue Stiolto.  Start using 2 puffs every morning. Continue use your albuterol 2 puffs if needed for shortness of breath, chest tightness, wheezing. Get the flu shot this month as planned You would probably benefit from the COVID-19 vaccine if you would like to get this. Follow with Dr. Lamonte Sakai in 12 months or sooner if you have any problems.   Tobacco use You will be due for your lung cancer screening CT scan in March 2024. Patient is on decreasing your cigarettes.  Continue to cut down.  Ultimate goal will be to stop altogether   Baltazar Apo, MD,  PhD 06/25/2022, 1:39 PM [facility] [phone] or if no answer before 7:00PM call [phone] For any issues after 7:00PM please call eLink [phone]

## 2022-06-25 NOTE — Assessment & Plan Note (Signed)
You will be due for your lung cancer screening CT scan in March 2024. Patient is on decreasing your cigarettes.  Continue to cut down.  Ultimate goal will be to stop altogether

## 2022-06-25 NOTE — Patient Instructions (Signed)
Please continue Stiolto.  Start using 2 puffs every morning. Continue use your albuterol 2 puffs if needed for shortness of breath, chest tightness, wheezing. Get the flu shot this month as planned You would probably benefit from the COVID-19 vaccine if you would like to get this. You will be due for your lung cancer screening CT scan in March 2024. Patient is on decreasing your cigarettes.  Continue to cut down.  Ultimate goal will be to stop altogether Follow with Dr. Lamonte Sakai in 12 months or sooner if you have any problems.

## 2022-06-27 ENCOUNTER — Ambulatory Visit: Payer: Medicare HMO | Admitting: Neurology

## 2022-06-27 ENCOUNTER — Encounter: Payer: Self-pay | Admitting: Neurology

## 2022-06-27 VITALS — BP 133/68 | HR 76 | Ht 67.0 in | Wt 183.0 lb

## 2022-06-27 DIAGNOSIS — E1142 Type 2 diabetes mellitus with diabetic polyneuropathy: Secondary | ICD-10-CM

## 2022-06-27 MED ORDER — NORTRIPTYLINE HCL 50 MG PO CAPS: 50.0000 mg | ORAL_CAPSULE | Freq: Every day | ORAL | 11 refills | Status: DC

## 2022-06-27 NOTE — Patient Instructions (Signed)
Increase nortriptyline '50mg'$  at bedtime  Continue gabapentin '900mg'$  three times daily  Return to clinic in 1 year

## 2022-06-27 NOTE — Progress Notes (Signed)
Follow-up Visit   Date: 06/27/22    Melanie [name] MRN: [id number] DOB: 02/09/1946   Interim History: Melanie [name] is a 76 y.o. 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   IMPRESSION/PLAN: Diabetic neuropathy affecting the feet and lower legs, mild progression  - Increase nortriptyline to '50mg'$  at bedtime.  Patient already with dry mouth which may get worse, if it is not tolerable, will need to reduce nortriptyline to '40mg'$  qhs  - Continue gabapentin '900mg'$  three times daily  - She is also taking Cymbalta '60mg'$  for depression, prescribed by PCP  - Patient educated on daily foot inspection, fall prevention, and safety precautions around the home.  Return to clinic in 1 year  ------------------------------------------------------------ UPDATE 06/27/2022:  She is here for follow-up visit.  Her neuropathy seems a little worse.  She continues to have numbness from the knees down and has sharp episodic pain in the legs.  Pain is better on gabapentin '900mg'$  TID and nortriptyline '40mg'$  at bedtime. She endorses dry mouth. She ran out of her medications for a day and pain was significantly worse.  No falls or weakness. She lives in a one-level home with a walk-in shower. She was hospitalized in February for pneumonia.   She also taking Cymbalta '60mg'$  daily for depression, prescribed by her PCP.    Medications:  Current Outpatient Medications on File Prior to Visit  Medication Sig Dispense Refill   albuterol (VENTOLIN HFA) 108 (90 Base) MCG/ACT inhaler 1 puff as needed 8 g 1   Alirocumab (PRALUENT) 75 MG/ML SOAJ Inject 1 pen into the skin every 14 (fourteen) days. 6 mL 3   amLODipine (NORVASC) 5 MG tablet Take 5 mg by mouth daily.     anastrozole (ARIMIDEX) 1 MG  tablet Take 1 tablet (1 mg total) by mouth daily. 90 tablet 2   aspirin EC 81 MG tablet Take 81 mg by mouth daily.      bisoprolol (ZEBETA) 5 MG tablet TAKE 1 TABLET DAILY 90 tablet 3   Cyanocobalamin (VITAMIN B-12 IJ) Inject 1,000 Units as directed every 30 (thirty) days.     DULoxetine (CYMBALTA) 60 MG capsule Take 60 mg by mouth daily.     gabapentin (NEURONTIN) 300 MG capsule TAKE THREE CAPSULES BY MOUTH EVERY MORNING and TAKE THREE CAPSULES BY MOUTH AT NOON and TAKE THREE CAPSULES BY MOUTH EVERYDAY AT BEDTIME 270 capsule 0   insulin degludec (TRESIBA) 100 UNIT/ML FlexTouch Pen Inject into the skin.     lisinopril (ZESTRIL) 40 MG tablet Take 20 mg by mouth daily.     metFORMIN (GLUCOPHAGE) 1000 MG tablet Take 1,000 mg by mouth once.     nitroGLYCERIN (NITROSTAT) 0.4 MG SL tablet Place 1 tablet (0.4 mg total) under the tongue every 5 (five) minutes as needed. 25 tablet 3   nortriptyline (PAMELOR) 10 MG capsule TAKE FOUR CAPSULES BY MOUTH EVERYDAY AT BEDTIME 120 capsule 0   Tiotropium Bromide-Olodaterol (STIOLTO RESPIMAT) 2.5-2.5 MCG/ACT AERS Inhale 2 puffs into the lungs daily. 4 g 5   glipiZIDE (GLUCOTROL) 5 MG tablet Take 5 mg by mouth 2 (two) times daily before a meal.  (Patient not taking: Reported on 06/25/2022)     hydrochlorothiazide (MICROZIDE) 12.5 MG capsule Take 1 capsule (12.5 mg total) by mouth daily. (Patient not taking: Reported on 06/25/2022) 90 capsule 3  HYDROcodone-acetaminophen (NORCO/VICODIN) 5-325 MG tablet Take 1-2 tablets by mouth every 6 (six) hours as needed for moderate pain. (Patient not taking: Reported on 06/25/2022) 30 tablet 0   lisinopril (ZESTRIL) 20 MG tablet TAKE 1 TABLET DAILY (Patient not taking: Reported on 06/27/2022) 90 tablet 3   No current facility-administered medications on file prior to visit.    Allergies:  Allergies  Allergen Reactions   Fenofibrate Other (See Comments)    "muscle spasms"   Breztri Aerosphere  [Budeson-Glycopyrrol-Formoterol] Other (See Comments)    Face turned red all over.    Metoprolol Other (See Comments)    Headache   Rosuvastatin Other (See Comments)    Muscle aches    Vital Signs:  BP 133/68   Pulse 76   Ht '5\' 7"'$  (1.702 m)   Wt 183 lb (83 kg)   SpO2 97%   BMI 28.66 kg/m   Neurological Exam: MENTAL STATUS including orientation to time, place, person, recent and remote memory, attention span and concentration, language, and fund of knowledge is normal.    CRANIAL NERVES: Pupils are around and reactive to light.  Extraocular muscles intact.  Face is symmetric   MOTOR:  Motor strength is 5/5 in all extremities, including distally in the feet.    MSRs:  Reflexes are 2+/4 in the upper extremities and absent in the lower extremities.   SENSORY:  Vibration is trace at the knees, absent at the ankles.  COORDINATION/GAIT:  Gait is stable and unassisted, mildly wide-based, stooped posture  Data: MRI lumbar spine 04/03/2017: 1. Previously seen left subarticular disc protrusion L3-L4 has involuted or has been resected. There remains severe left L3-L4 neural foraminal stenosis due to foraminal disc protrusion superimposed on a diffuse disc bulge. 2. Unchanged moderate right L4-L5 neural foraminal stenosis secondary to endplate spurring. 3. Posterior decompression at L2-L4 with wide patency of the thecal sac.     Thank you for allowing me to participate in patient's care.  If I can answer any additional questions, I would be pleased to do so.    Sincerely,    Jaymen Fetch K. Posey Pronto, DO

## 2022-07-02 ENCOUNTER — Telehealth: Payer: Self-pay | Admitting: Pharmacy Technician

## 2022-07-02 ENCOUNTER — Other Ambulatory Visit: Payer: Self-pay | Admitting: Neurology

## 2022-07-02 DIAGNOSIS — Z596 Low income: Secondary | ICD-10-CM

## 2022-07-02 NOTE — Progress Notes (Addendum)
[facility])                                            [place] Team   07/09/2022-ADDENDUM  Spoke to [place] at [facility] who informs the word "Respimat: needs to follow Stiolto. Corrected and refaxed to [place]. Oryn Casanova, [place]  [phone]  07/02/2022  TYISHIA AUNE November 16, 1945 [id number]  Received both patient and provider portion(s) of patient assistance application(s) for Stiolto. Faxed completed application and required documents into [employer].    Annalena Piatt P. Karinda Cabriales, [place]  [phone]

## 2022-07-04 ENCOUNTER — Other Ambulatory Visit: Payer: Self-pay | Admitting: Neurology

## 2022-07-07 ENCOUNTER — Telehealth: Payer: Self-pay | Admitting: Neurology

## 2022-07-07 ENCOUNTER — Other Ambulatory Visit: Payer: Self-pay | Admitting: Neurology

## 2022-07-07 ENCOUNTER — Other Ambulatory Visit: Payer: Self-pay

## 2022-07-07 MED ORDER — GABAPENTIN 300 MG PO CAPS: ORAL_CAPSULE | ORAL | 0 refills | Status: DC

## 2022-07-07 NOTE — Telephone Encounter (Signed)
Patient [facility]

## 2022-07-09 DIAGNOSIS — E538 Deficiency of other specified B group vitamins: Secondary | ICD-10-CM | POA: Diagnosis not present

## 2022-07-09 DIAGNOSIS — E669 Obesity, unspecified: Secondary | ICD-10-CM | POA: Diagnosis not present

## 2022-07-09 DIAGNOSIS — D0501 Lobular carcinoma in situ of right breast: Secondary | ICD-10-CM | POA: Diagnosis not present

## 2022-07-09 DIAGNOSIS — Z23 Encounter for immunization: Secondary | ICD-10-CM | POA: Diagnosis not present

## 2022-07-09 DIAGNOSIS — M8580 Other specified disorders of bone density and structure, unspecified site: Secondary | ICD-10-CM | POA: Diagnosis not present

## 2022-07-09 DIAGNOSIS — I700 Atherosclerosis of aorta: Secondary | ICD-10-CM | POA: Diagnosis not present

## 2022-07-09 DIAGNOSIS — Z720 Tobacco use: Secondary | ICD-10-CM | POA: Diagnosis not present

## 2022-07-09 DIAGNOSIS — E1129 Type 2 diabetes mellitus with other diabetic kidney complication: Secondary | ICD-10-CM | POA: Diagnosis not present

## 2022-07-09 DIAGNOSIS — N1831 Chronic kidney disease, stage 3a: Secondary | ICD-10-CM | POA: Diagnosis not present

## 2022-07-09 DIAGNOSIS — E1149 Type 2 diabetes mellitus with other diabetic neurological complication: Secondary | ICD-10-CM | POA: Diagnosis not present

## 2022-07-09 DIAGNOSIS — I129 Hypertensive chronic kidney disease with stage 1 through stage 4 chronic kidney disease, or unspecified chronic kidney disease: Secondary | ICD-10-CM | POA: Diagnosis not present

## 2022-07-09 DIAGNOSIS — Z794 Long term (current) use of insulin: Secondary | ICD-10-CM | POA: Diagnosis not present

## 2022-07-09 DIAGNOSIS — E785 Hyperlipidemia, unspecified: Secondary | ICD-10-CM | POA: Diagnosis not present

## 2022-07-11 ENCOUNTER — Telehealth: Payer: Self-pay | Admitting: Pharmacy Technician

## 2022-07-11 DIAGNOSIS — Z596 Low income: Secondary | ICD-10-CM

## 2022-07-11 NOTE — Progress Notes (Addendum)
[facility])                                            [place] Team    07/11/2022  Melanie [name] [birth year] [id number]  Care coordination call placed to [facility] in regard to [facility] application.  Spoke to [employer] who informs patient is APPROVED 07/10/22-09/15/2023.  Medication will be delivered to the patient's home.  [name] P. Roxanna Mcever, [place]  [phone]

## 2022-07-17 DIAGNOSIS — Z794 Long term (current) use of insulin: Secondary | ICD-10-CM | POA: Diagnosis not present

## 2022-07-17 DIAGNOSIS — E1129 Type 2 diabetes mellitus with other diabetic kidney complication: Secondary | ICD-10-CM | POA: Diagnosis not present

## 2022-07-17 DIAGNOSIS — I2510 Atherosclerotic heart disease of native coronary artery without angina pectoris: Secondary | ICD-10-CM | POA: Diagnosis not present

## 2022-07-17 DIAGNOSIS — G720 Drug-induced myopathy: Secondary | ICD-10-CM | POA: Diagnosis not present

## 2022-07-17 DIAGNOSIS — N1831 Chronic kidney disease, stage 3a: Secondary | ICD-10-CM | POA: Diagnosis not present

## 2022-07-17 DIAGNOSIS — I129 Hypertensive chronic kidney disease with stage 1 through stage 4 chronic kidney disease, or unspecified chronic kidney disease: Secondary | ICD-10-CM | POA: Diagnosis not present

## 2022-07-22 ENCOUNTER — Other Ambulatory Visit: Payer: Self-pay | Admitting: Internal Medicine

## 2022-07-22 DIAGNOSIS — Z1231 Encounter for screening mammogram for malignant neoplasm of breast: Secondary | ICD-10-CM

## 2022-08-14 ENCOUNTER — Telehealth: Payer: Self-pay | Admitting: Pharmacy Technician

## 2022-08-14 DIAGNOSIS — Z596 Low income: Secondary | ICD-10-CM

## 2022-08-14 NOTE — Progress Notes (Signed)
[facility])                                            [place] Team    08/14/2022  ALMENA HOKENSON Apr 27, 1946 [id number]  Received both patient and provider portion(s) of patient assistance application(s) for Ozempic and [place]. Faxed completed application and required documents into [employer].  Samil Mecham P. Verl Kitson, [place]  [phone]

## 2022-08-29 ENCOUNTER — Ambulatory Visit: Payer: Medicare HMO | Admitting: Physician Assistant

## 2022-09-02 ENCOUNTER — Other Ambulatory Visit: Payer: Medicare HMO

## 2022-09-02 ENCOUNTER — Ambulatory Visit: Payer: Medicare HMO | Admitting: Hematology and Oncology

## 2022-09-03 NOTE — Progress Notes (Unsigned)
Office Visit    Patient Name: Melanie [name] Date of Encounter: 09/04/2022  PCP:  Ginger Organ., MD   [facility]  Cardiologist:  [name], MD  Advanced Practice Provider:  No care team member to display Electrophysiologist:  None   HPI    Melanie [name] is a 76 y.o. female with a past medical history of paroxysmal atrial fibrillation, hypertension and bradycardia presents today for annual follow-up visit.  She was seen by Dr. Acie Fredrickson 12/22 and at that time she was doing well.  She was under a lot of of stress taking care of her husband and was unable to get regular exercise.  She has been seen since 2014.  She had maintained on metoprolol and diltiazem for a long time for her paroxysmal atrial fibrillation.  Her husband passed away 2019-07-11 and they were married for 50 years.  She was having a difficult time with this.  Last year when she was seen she was still smoking.  She is trying to cut back.  She was having occasional indigestion.  Her primary care was managing her hypertension and lipid control.  Today, she tells me that she had to pneumonia and had to be in the hospital for three days. At the end of the summer she had elevated BP and her primary was trying to get it under better control. She increased her Norvasc to 101m in the morning and added 585min the evening. Otherwise has been doing well without any chest pain or SOB. She has two children and 5 grandchildren she is looking forward to spending time with over the holidays.   Reports no shortness of breath nor dyspnea on exertion. Reports no chest pain, pressure, or tightness. No edema, orthopnea, PND. Reports no palpitations.   Past Medical History    Past Medical History:  Diagnosis Date   Arthritis    Bradycardia    Depression    under control   DM (diabetes [place]) ([place]   type 2   Dyspnea    with some exertion   Family history of anesthesia complication     mother-nausea and vomiting   Family history of breast cancer    Family history of ovarian cancer    GERD (gastroesophageal reflux disease)    occasional   Headache    HTN (hypertension)    Neuropathy    legs and feet   Neuropathy    feet   PAF (paroxysmal atrial fibrillation) (HC[facility]10 years ago   no problems since, due to stress   Situational stress    "was taking care of sick husband"   Past Surgical History:  Procedure Laterality Date   ABDOMINAL HYSTERECTOMY  1980   partial   APPENDECTOMY  1980   BACK SURGERY  2004   BREAST EXCISIONAL BIOPSY Right 2019   LCIS; ADH   BREAST LUMPECTOMY WITH RADIOACTIVE SEED LOCALIZATION Right 10/27/2017   Procedure: RIGHT BREAST [place] Surgeon: BlCoralie KeensMD;  [facility] Service: General;  Laterality: Right;   CARDIAC CATHETERIZATION     in the late 90's/early 2000's   CARPAL TUNNEL RELEASE Bilateral 2003, 2004   CHOLECYSTECTOMY N/A 12/28/2014   Procedure: LAPAROSCOPIC CHOLECYSTECTOMY WITH INTRAOPERATIVE CHOLANGIOGRAM;  Surgeon: ToArmandina GemmaMD;  Location: [facility];  Service: General;  Laterality: N/A;   COLONOSCOPY  8 years ago   JOINT REPLACEMENT Bilateral 2007   KNEE  ARTHROSCOPY Bilateral 1990's   LAMINECTOMY  11/28/2016   L 3    LUMBAR LAMINECTOMY N/A 07/20/2013   Procedure: MICRODISCECTOMY LUMBAR LAMINECTOMY;  Surgeon: [name], MD;  Location: [place];  Service: Orthopedics;  Laterality: N/A;  L2-3 Decompression   LUMBAR LAMINECTOMY/DECOMPRESSION MICRODISCECTOMY N/A 11/28/2016   Procedure: L3 LAMINECTOMY, PARTIAL LAMINECTOMY WITH REMOVAL OF FREE FRAGMENT LEFT L3-4;  Surgeon: [name], MD;  Location: [place];  Service: Orthopedics;  Laterality: N/A;   LUMBAR LAMINECTOMY/DECOMPRESSION MICRODISCECTOMY N/A 02/18/2017   Procedure: L3-4 MICRODISCECTOMY;  Surgeon: [name], MD;  Location: [place];  Service: Orthopedics;  Laterality: N/A;    Allergies  Allergies   Allergen Reactions   Fenofibrate Other (See Comments)    "muscle spasms"   Breztri Aerosphere [Budeson-Glycopyrrol-Formoterol] Other (See Comments)    Face turned red all over.    Metoprolol Other (See Comments)    Headache   Rosuvastatin Other (See Comments)    Muscle aches     EKGs/Labs/Other Studies Reviewed:   The following studies were reviewed today:  11/11/2018 echocardiogram IMPRESSIONS     1. The left ventricle has low normal systolic function, with an ejection  fraction of 50-55%. The cavity size was normal. There is mildly increased  left ventricular wall thickness. Left ventricular diastolic Doppler  parameters are consistent with  impaired relaxation Indeterminent filling pressures The E/e' is 8-15. No  evidence of left ventricular regional wall motion abnormalities.   2. No evidence of left ventricular regional wall motion abnormalities.   3. The right ventricle has normal systolic function. The cavity was  normal. There is no increase in right ventricular wall thickness.   4. The mitral valve is degenerative. Mild calcification of the mitral  valve leaflet. There is mild mitral annular calcification present. No  evidence of mitral valve stenosis.   5. The tricuspid valve is normal in structure.   6. The aortic valve is tricuspid Mild sclerosis of the aortic valve.   7. When compared to the prior study: Compared to a prior study in 12/2017,  there are no significant changes.   SUMMARY    LVEF 50-55%, mild LVH, normal wall motion, grade 1 DD, indeterminate  LV filling pressure, normal biatrial size, MAC with trivial MR,  aortic valve sclerosis, trivial TR, normal IVC   FINDINGS   Left Ventricle: The left ventricle has low normal systolic function, with  an ejection fraction of 50-55%. The cavity size was normal. There is  mildly increased left ventricular wall thickness. Left ventricular  diastolic Doppler parameters are consistent   with impaired  relaxation Indeterminent filling pressures The E/e' is  8-15. No evidence of left ventricular regional wall motion abnormalities..  Right Ventricle: The right ventricle has normal systolic function. The  cavity was normal. There is no increase in right ventricular wall  thickness.  Left Atrium: left atrial size was normal in size  Right Atrium: right atrial size was normal in size.  Interatrial Septum: No atrial level shunt detected by color flow Doppler.  Pericardium: There is no evidence of pericardial effusion.  Mitral Valve: The mitral valve is degenerative in appearance. Mild  calcification of the mitral valve leaflet. There is mild mitral annular  calcification present. Mitral valve regurgitation is trivial by color flow  Doppler. No evidence of mitral valve  stenosis.  Tricuspid Valve: The tricuspid valve is normal in structure. Tricuspid  valve regurgitation is trivial by color flow Doppler.  Aortic Valve: The aortic valve  is tricuspid Mild sclerosis of the aortic  valve. Aortic valve regurgitation was not visualized by color flow  Doppler. There is no evidence of aortic valve stenosis.  Pulmonic Valve: The pulmonic valve was grossly normal. Pulmonic valve  regurgitation is trivial by color flow Doppler.  Venous: The inferior vena cava is normal in size with greater than 50%  respiratory variability.  Compared to previous exam: Compared to a prior study in 12/2017, there are  no significant changes.   EKG:  EKG is ordered today.  NSR, rate 73 bpm  Recent Labs: No results found for requested labs within last 365 days.  Recent Lipid Panel    Component Value Date/Time   CHOL 94 (L) 08/19/2019 0805   TRIG 222 (H) 08/19/2019 0805   HDL 35 (L) 08/19/2019 0805   CHOLHDL 2.7 08/19/2019 0805   LDLCALC 25 08/19/2019 0805    Home Medications   Current Meds  Medication Sig   albuterol (VENTOLIN HFA) 108 (90 Base) MCG/ACT inhaler 1 puff as needed   Alirocumab (PRALUENT) 75  MG/ML SOAJ Inject 1 pen into the skin every 14 (fourteen) days.   amLODipine (NORVASC) 5 MG tablet Take 5 mg by mouth daily.   aspirin EC 81 MG tablet Take 81 mg by mouth daily.    bisoprolol (ZEBETA) 5 MG tablet TAKE 1 TABLET DAILY   Cyanocobalamin (VITAMIN B-12 IJ) Inject 1,000 Units as directed every 30 (thirty) days.   DULoxetine (CYMBALTA) 60 MG capsule Take 60 mg by mouth daily.   gabapentin (NEURONTIN) 300 MG capsule TAKE THREE CAPSULES BY MOUTH EVERY MORNING and TAKE THREE CAPSULES BY MOUTH AT NOON and TAKE THREE CAPSULES BY MOUTH EVERYDAY AT BEDTIME   hydrochlorothiazide (MICROZIDE) 12.5 MG capsule Take 1 capsule (12.5 mg total) by mouth daily.   insulin degludec (TRESIBA) 100 UNIT/ML FlexTouch Pen Inject into the skin.   lisinopril (ZESTRIL) 40 MG tablet Take 20 mg by mouth daily.   metFORMIN (GLUCOPHAGE) 1000 MG tablet Take 1,000 mg by mouth once.   nitroGLYCERIN (NITROSTAT) 0.4 MG SL tablet Place 1 tablet (0.4 mg total) under the tongue every 5 (five) minutes as needed.   nortriptyline (PAMELOR) 50 MG capsule Take 1 capsule (50 mg total) by mouth at bedtime.   Semaglutide,0.25 or 0.5MG/DOS, (OZEMPIC, 0.25 OR 0.5 MG/DOSE,) 2 MG/3ML SOPN Inject 0.5 mg into the skin once a week.   Tiotropium Bromide-Olodaterol (STIOLTO RESPIMAT) 2.5-2.5 MCG/ACT AERS Inhale 2 puffs into the lungs daily.     Review of Systems      All other systems reviewed and are otherwise negative except as noted above.  Physical Exam    VS:  BP 120/60 (BP Location: Left Arm, Patient Position: Sitting, Cuff Size: Normal)   Pulse 73   Ht _0  (1.702 m)   Wt 180 lb (81.6 kg)   BMI 28.19 kg/m  , BMI Body mass index is 28.19 kg/m.  Wt Readings from Last 3 Encounters:  09/04/22 180 lb (81.6 kg)  06/27/22 183 lb (83 kg)  06/25/22 184 lb 9.6 oz (83.7 kg)     GEN: Well nourished, well developed, in no acute distress. HEENT: normal. Neck: Supple, no JVD, carotid bruits, or masses. Cardiac: RRR, no murmurs,  rubs, or gallops. No clubbing, cyanosis, edema.  Radials/PT 2+ and equal bilaterally.  Respiratory:  Respirations regular and unlabored, clear to auscultation bilaterally. GI: Soft, nontender, nondistended. MS: No deformity or atrophy. Skin: Warm and dry, no rash. Neuro:  Strength and sensation  are intact. Psych: Normal affect.  Assessment & Plan    Hypertension -well controlled today -continue norvasc 43m BID, lisinopril 466mdaily -continue to monitor at home  Bradycardia -HR in the 70s -continue current medications  Hyperlipidemia -fish oil? She was on it in past -triglycerides are high, 424 -lipid panel in Feb when she sees her PCP and we will need records [place]  History of chest pain -no chest pain today  Smoker -she is trying to cut back  -she does not think she will ever fully quit -resources offered today  Paroxysmal atrial fibrillation -no recent episodes -not on any anticoagulation at this time -if she has a reoccurrence she will need to start DOAC/warfarin   Type 2 diabetes mellitus -A1C was 12.  -some med changes per PCP -she will need close follow-up        Disposition: Follow up 1 year with PhMertie MooresMD or APP.  Signed, TeElgie CollardPA-C 09/04/2022, 4:36 PM [facility]

## 2022-09-04 ENCOUNTER — Encounter: Payer: Self-pay | Admitting: Physician Assistant

## 2022-09-04 ENCOUNTER — Ambulatory Visit: Payer: Medicare HMO | Attending: Physician Assistant | Admitting: Physician Assistant

## 2022-09-04 VITALS — BP 120/60 | HR 73 | Ht 67.0 in | Wt 180.0 lb

## 2022-09-04 DIAGNOSIS — R001 Bradycardia, unspecified: Secondary | ICD-10-CM

## 2022-09-04 DIAGNOSIS — Z794 Long term (current) use of insulin: Secondary | ICD-10-CM | POA: Diagnosis not present

## 2022-09-04 DIAGNOSIS — E1169 Type 2 diabetes mellitus with other specified complication: Secondary | ICD-10-CM

## 2022-09-04 DIAGNOSIS — R079 Chest pain, unspecified: Secondary | ICD-10-CM | POA: Diagnosis not present

## 2022-09-04 DIAGNOSIS — I480 Paroxysmal atrial fibrillation: Secondary | ICD-10-CM | POA: Diagnosis not present

## 2022-09-04 DIAGNOSIS — I10 Essential (primary) hypertension: Secondary | ICD-10-CM

## 2022-09-04 DIAGNOSIS — E785 Hyperlipidemia, unspecified: Secondary | ICD-10-CM | POA: Diagnosis not present

## 2022-09-04 DIAGNOSIS — Z720 Tobacco use: Secondary | ICD-10-CM

## 2022-09-04 DIAGNOSIS — E538 Deficiency of other specified B group vitamins: Secondary | ICD-10-CM | POA: Diagnosis not present

## 2022-09-04 NOTE — Patient Instructions (Signed)
Medication Instructions:  Your physician recommends that you continue on your current medications as directed. Please refer to the Current Medication list given to you today.  *If you need a refill on your cardiac medications before your next appointment, please call your pharmacy*   Lab Work: Have your primary care provider draw a lipid panel when your have labs drawn in February and have them fax [place] the results [phone] If you have labs (blood work) drawn today and your tests are completely normal, you will receive your results only by: [place] (if you have MyChart) OR A paper copy in the mail If you have any lab test that is abnormal or we need to change your treatment, we will call you to review the results.   Follow-Up: At [facility], you and your health needs are our priority.  As part of our continuing mission to provide you with exceptional heart care, we have created designated Provider Care Teams.  These Care Teams include your primary Cardiologist (physician) and Advanced Practice Providers (APPs -  Physician Assistants and Nurse Practitioners) who all work together to provide you with the care you need, when you need it.  We recommend signing up for the patient portal called "MyChart".  Sign up information is provided on this After Visit Summary.  MyChart is used to connect with patients for Virtual Visits (Telemedicine).  Patients are able to view lab/test results, encounter notes, upcoming appointments, etc.  Non-urgent messages can be sent to your provider as well.   To learn more about what you can do with MyChart, go to [email or web address].    Your next appointment:   1 year(s)  The format for your next appointment:   In Person  Provider:   [name], MD   Important Information About [place]

## 2022-09-12 ENCOUNTER — Other Ambulatory Visit: Payer: Self-pay

## 2022-09-12 DIAGNOSIS — D0501 Lobular carcinoma in situ of right breast: Secondary | ICD-10-CM

## 2022-09-17 ENCOUNTER — Other Ambulatory Visit: Payer: Self-pay

## 2022-09-17 ENCOUNTER — Inpatient Hospital Stay: Payer: Medicare HMO | Attending: Hematology and Oncology

## 2022-09-17 ENCOUNTER — Inpatient Hospital Stay (HOSPITAL_BASED_OUTPATIENT_CLINIC_OR_DEPARTMENT_OTHER): Payer: Medicare HMO | Admitting: Hematology and Oncology

## 2022-09-17 VITALS — BP 139/71 | HR 79 | Temp 98.1°F | Resp 16 | Ht 67.0 in | Wt 180.0 lb

## 2022-09-17 DIAGNOSIS — F17210 Nicotine dependence, cigarettes, uncomplicated: Secondary | ICD-10-CM | POA: Diagnosis not present

## 2022-09-17 DIAGNOSIS — Z8249 Family history of ischemic heart disease and other diseases of the circulatory system: Secondary | ICD-10-CM | POA: Insufficient documentation

## 2022-09-17 DIAGNOSIS — Z79899 Other long term (current) drug therapy: Secondary | ICD-10-CM | POA: Diagnosis not present

## 2022-09-17 DIAGNOSIS — Z8269 Family history of other diseases of the musculoskeletal system and connective tissue: Secondary | ICD-10-CM | POA: Diagnosis not present

## 2022-09-17 DIAGNOSIS — D0501 Lobular carcinoma in situ of right breast: Secondary | ICD-10-CM | POA: Diagnosis not present

## 2022-09-17 DIAGNOSIS — D241 Benign neoplasm of right breast: Secondary | ICD-10-CM | POA: Diagnosis not present

## 2022-09-17 DIAGNOSIS — Z823 Family history of stroke: Secondary | ICD-10-CM | POA: Diagnosis not present

## 2022-09-17 DIAGNOSIS — Z90710 Acquired absence of both cervix and uterus: Secondary | ICD-10-CM | POA: Insufficient documentation

## 2022-09-17 DIAGNOSIS — Z9049 Acquired absence of other specified parts of digestive tract: Secondary | ICD-10-CM | POA: Diagnosis not present

## 2022-09-17 DIAGNOSIS — Z8041 Family history of malignant neoplasm of ovary: Secondary | ICD-10-CM | POA: Insufficient documentation

## 2022-09-17 DIAGNOSIS — N6021 Fibroadenosis of right breast: Secondary | ICD-10-CM | POA: Insufficient documentation

## 2022-09-17 DIAGNOSIS — Z820 Family history of epilepsy and other diseases of the nervous system: Secondary | ICD-10-CM | POA: Diagnosis not present

## 2022-09-17 DIAGNOSIS — Z809 Family history of malignant neoplasm, unspecified: Secondary | ICD-10-CM | POA: Diagnosis not present

## 2022-09-17 DIAGNOSIS — E119 Type 2 diabetes mellitus without complications: Secondary | ICD-10-CM | POA: Insufficient documentation

## 2022-09-17 DIAGNOSIS — I480 Paroxysmal atrial fibrillation: Secondary | ICD-10-CM | POA: Insufficient documentation

## 2022-09-17 DIAGNOSIS — Z800 Family history of malignant neoplasm of digestive organs: Secondary | ICD-10-CM | POA: Insufficient documentation

## 2022-09-17 DIAGNOSIS — Z803 Family history of malignant neoplasm of breast: Secondary | ICD-10-CM | POA: Diagnosis not present

## 2022-09-17 LAB — CBC WITH DIFFERENTIAL (CANCER CENTER ONLY)
Abs Immature Granulocytes: 0.04 10*3/uL (ref 0.00–0.07)
Basophils Absolute: 0.1 10*3/uL (ref 0.0–0.1)
Basophils Relative: 1 %
Eosinophils Absolute: 0.3 10*3/uL (ref 0.0–0.5)
Eosinophils Relative: 3 %
HCT: 41.1 % (ref 36.0–46.0)
Hemoglobin: 13.4 g/dL (ref 12.0–15.0)
Immature Granulocytes: 1 %
Lymphocytes Relative: 30 %
Lymphs Abs: 2.4 10*3/uL (ref 0.7–4.0)
MCH: 29.8 pg (ref 26.0–34.0)
MCHC: 32.6 g/dL (ref 30.0–36.0)
MCV: 91.3 fL (ref 80.0–100.0)
Monocytes Absolute: 0.5 10*3/uL (ref 0.1–1.0)
Monocytes Relative: 6 %
Neutro Abs: 4.7 10*3/uL (ref 1.7–7.7)
Neutrophils Relative %: 59 %
Platelet Count: 293 10*3/uL (ref 150–400)
RBC: 4.5 MIL/uL (ref 3.87–5.11)
RDW: 14.2 % (ref 11.5–15.5)
WBC Count: 8.1 10*3/uL (ref 4.0–10.5)
nRBC: 0 % (ref 0.0–0.2)

## 2022-09-17 LAB — CMP (CANCER CENTER ONLY)
ALT: 13 U/L (ref 0–44)
AST: 15 U/L (ref 15–41)
Albumin: 3.9 g/dL (ref 3.5–5.0)
Alkaline Phosphatase: 75 U/L (ref 38–126)
Anion gap: 9 (ref 5–15)
BUN: 9 mg/dL (ref 8–23)
CO2: 28 mmol/L (ref 22–32)
Calcium: 9.6 mg/dL (ref 8.9–10.3)
Chloride: 101 mmol/L (ref 98–111)
Creatinine: 0.97 mg/dL (ref 0.44–1.00)
GFR, Estimated: >60 mL/min (ref >60)
Glucose, Bld: 117 mg/dL — ABNORMAL HIGH (ref 70–99)
Potassium: 3.4 mmol/L — ABNORMAL LOW (ref 3.5–5.1)
Sodium: 138 mmol/L (ref 135–145)
Total Bilirubin: 0.5 mg/dL (ref 0.3–1.2)
Total Protein: 8 g/dL (ref 6.5–8.1)

## 2022-09-17 NOTE — Progress Notes (Unsigned)
[facility]  [phone] [phone]     ID: DANINE HOR DOB: 11-Mar-1946  MR#: [id number]  VVZ#:[id number]  Patient Care Team: Ginger Organ., MD as PCP - General (Internal Medicine) Nahser, Wonda Cheng, MD as PCP - Cardiology (Cardiology) Magrinat, [name], MD (Inactive) as Consulting Physician (Oncology) Coralie Keens, MD as Consulting Physician (General Surgery) [name], DO as Consulting Physician (Neurology) Paula Compton, MD as Consulting Physician (Obstetrics and Gynecology) [name], MD as Consulting Physician (Orthopedic Surgery) OTHER MD:  CHIEF COMPLAINT: Atypical ductal hyperplasia, lobular carcinoma in situ  CURRENT TREATMENT: Anastrozole   INTERVAL HISTORY: Stehanie returns today for follow-up of her atypical ductal hyperplasia, 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   She ran out of anastrozole in October.  She tells me she called and left message this year but was not able to get it refilled.  Went on it however she was having no symptoms whatsoever that she was aware of..  Since her last visit, she underwent short-term right diagnostic mammography and right breast ultrasonography at [facility] on 08/24/2020 showing: breast density category B; no evidence of malignancy; benign 4 mm cyst in upper-inner right breast, which accounts for screening mammographic finding.  She is scheduled for annual screening mammogram on 08/26/2021.  She also underwent lung cancer screening 11/19/2020 showing no evidence of malignancy.   REVIEW OF SYSTEMS: Lolita sold her house and moved to [place].  She has a house there.  She does not like apartments or townhouse that she.  She does not have a garden and she does not have steps.  She is not exercising regularly.     COVID 19 VACCINATION STATUS: Status post Pfizer x2, most recent dose March 2021; no booster as of November 2022.  Has had COVID x1   HISTORY OF CURRENT  ILLNESS: From the original intake note:  PARALEE PENDERGRASS had routine screening mammography on 09/22/2017 showing a possible abnormality in the right breast. She underwent bilateral diagnostic mammography with tomography and right breast ultrasonography at [facility] on 09/25/2017 showing: Indeterminate right breast mass in the central lateral right breast at middle depth. identified mammographically without associated ultrasound correlate. Evaluation of the right axilla demonstrates no suspicious adenopathy.   Accordingly on 10/01/2017 she proceeded to biopsy of the right breast area in question. The pathology from this procedure showed ([id number]): lobular carcinoma in situ. Atypical ductal hyperplasia. Fibrocystic changes.   On 10/27/2017, she underwent right breast lumpectomy showing ([id number]) focal atypical ductal hyperplasia and sclerosing adenosis. Fibroadenoma. Previous biopsy site and biopsy clip. No residual lobular carcinoma in situ. No invasive carcinoma.   The patient's subsequent history is as detailed below.   PAST MEDICAL HISTORY: Past Medical History:  Diagnosis Date   Arthritis    Bradycardia    Depression    under control   DM (diabetes mellitus) ([place])    type 2   Dyspnea    with some exertion   Family history of anesthesia complication    mother-nausea and vomiting   Family history of breast cancer    Family history of ovarian cancer    GERD (gastroesophageal reflux disease)    occasional   Headache    HTN (hypertension)    Neuropathy    legs and feet   Neuropathy    feet   PAF (paroxysmal atrial fibrillation) ([place]) 10 years ago   no problems since, due to stress   Situational stress    "  was taking care of sick husband"  stress related heart palpitations in 1998   PAST SURGICAL HISTORY: Past Surgical History:  Procedure Laterality Date   ABDOMINAL HYSTERECTOMY  1980   partial   APPENDECTOMY  1980   BACK SURGERY  2004   BREAST EXCISIONAL BIOPSY  Right 2019   LCIS; ADH   BREAST LUMPECTOMY WITH RADIOACTIVE SEED LOCALIZATION Right 10/27/2017   Procedure: RIGHT BREAST PARTIAL MASTECTIOMY WITH RADIOACTIVE SEED LOCALIZATION ERAS PATHWAY;  Surgeon: Coralie Keens, MD;  Location: [facility];  Service: General;  Laterality: Right;   CARDIAC CATHETERIZATION     in the late 90's/early 2000's   CARPAL TUNNEL RELEASE Bilateral 2003, 2004   CHOLECYSTECTOMY N/A 12/28/2014   Procedure: LAPAROSCOPIC CHOLECYSTECTOMY WITH INTRAOPERATIVE CHOLANGIOGRAM;  Surgeon: Armandina Gemma, MD;  Location: [facility];  Service: General;  Laterality: N/A;   COLONOSCOPY  8 years ago   JOINT REPLACEMENT Bilateral 2007   KNEE ARTHROSCOPY Bilateral 1990's   LAMINECTOMY  11/28/2016   L 3    LUMBAR LAMINECTOMY N/A 07/20/2013   Procedure: MICRODISCECTOMY LUMBAR LAMINECTOMY;  Surgeon: [name], MD;  Location: [facility];  Service: Orthopedics;  Laterality: N/A;  L2-3 Decompression   LUMBAR LAMINECTOMY/DECOMPRESSION MICRODISCECTOMY N/A 11/28/2016   Procedure: L3 LAMINECTOMY, PARTIAL LAMINECTOMY WITH REMOVAL OF FREE FRAGMENT LEFT L3-4;  Surgeon: [name], MD;  Location: [facility];  Service: Orthopedics;  Laterality: N/A;   LUMBAR LAMINECTOMY/DECOMPRESSION MICRODISCECTOMY N/A 02/18/2017   Procedure: L3-4 MICRODISCECTOMY;  Surgeon: [name], MD;  Location: [facility];  Service: Orthopedics;  Laterality: N/A;    FAMILY HISTORY Family History  Problem Relation Age of Onset   Heart attack Father        Deceased, 32   Other Mother        Deceased, 6   Neuropathy Mother    Hypertension Mother    Breast cancer Sister        50's   Ovarian cancer Sister        dx and died in 23's   Multiple sclerosis Sister    Neuropathy Sister    Stroke Sister    Hypertension Sister    Cancer Maternal Uncle        dx >50, type unk   Stomach cancer Paternal Uncle        dx >50   Cancer Maternal Uncle        dx >50, type unk   Cancer Cousin 60       type of cancer unk  The patient's father died  of a heart attack at age 42. The patient's mother died due to old age at 53.  The patient has no brothers and 3 sisters. The patient's youngest sister had breast cancer and passed away 5 years later due to ovarian cancer, which was diagnosed in her early 20's.  The patient denies any other family members with  breast or ovarian cancer.    GYNECOLOGIC HISTORY:  Menarche: 77 years old Age at first live birth: 77 years old [place] P2 LMP: status post hysterectomy without BSO at age 74 She used contraceptives for about 2 years. She did not use HRTs.   SOCIAL HISTORY: (Updated December 2022). Natally is now retired from working in a [employer] in [place]. Her husband, Evelena Peat, who had been disabled with neck issues for the last 20 years, passed away from what sounds like metastatic esophageal cancer in 2020. Evelena Peat used to work in [employer] and was injured on  the job. At home is the patient's new cat Bella.  The patient's oldest son, Nicki Reaper, works for the [employer] and lives in [place], [place].  The patient's youngest son, Sharee Pimple, works at [employer] in [place]. The patient has 3 grandchildren. She attends [employer].    ADVANCED DIRECTIVES: She intends to name her son Nicki Reaper as her healthcare power of attorney (discussed 08/19/2022)   HEALTH MAINTENANCE: Social History   Tobacco Use   Smoking status: Every Day    Packs/day: 0.50    Years: 30.00    Total pack years: 15.00    Types: Cigarettes   Smokeless tobacco: Never   Tobacco comments:    10 cigarettes smoked daily ARJ 06/25/22  Vaping Use   Vaping Use: Never used  Substance Use Topics   Alcohol use: No    Alcohol/week: 0.0 standard drinks of alcohol   Drug use: No     Colonoscopy: 10 years ago at [facility]/ normal  PAP: s/p hysterctomy  Bone density: at Dr. Brigitte Pulse, normal per patient's report   Allergies  Allergen Reactions   Fenofibrate Other (See Comments)    "muscle spasms"   Breztri  Aerosphere [Budeson-Glycopyrrol-Formoterol] Other (See Comments)    Face turned red all over.    Metoprolol Other (See Comments)    Headache   Rosuvastatin Other (See Comments)    Muscle aches    Current Outpatient Medications  Medication Sig Dispense Refill   albuterol (VENTOLIN HFA) 108 (90 Base) MCG/ACT inhaler 1 puff as needed 8 g 1   Alirocumab (PRALUENT) 75 MG/ML SOAJ Inject 1 pen into the skin every 14 (fourteen) days. 6 mL 3   amLODipine (NORVASC) 5 MG tablet Take 5 mg by mouth daily.     aspirin EC 81 MG tablet Take 81 mg by mouth daily.      bisoprolol (ZEBETA) 5 MG tablet TAKE 1 TABLET DAILY 90 tablet 3   Cyanocobalamin (VITAMIN B-12 IJ) Inject 1,000 Units as directed every 30 (thirty) days.     DULoxetine (CYMBALTA) 60 MG capsule Take 60 mg by mouth daily.     gabapentin (NEURONTIN) 300 MG capsule TAKE THREE CAPSULES BY MOUTH EVERY MORNING and TAKE THREE CAPSULES BY MOUTH AT NOON and TAKE THREE CAPSULES BY MOUTH EVERYDAY AT BEDTIME 810 capsule 0   hydrochlorothiazide (MICROZIDE) 12.5 MG capsule Take 1 capsule (12.5 mg total) by mouth daily. 90 capsule 3   insulin degludec (TRESIBA) 100 UNIT/ML FlexTouch Pen Inject into the skin.     lisinopril (ZESTRIL) 40 MG tablet Take 20 mg by mouth daily.     metFORMIN (GLUCOPHAGE) 1000 MG tablet Take 1,000 mg by mouth once.     nitroGLYCERIN (NITROSTAT) 0.4 MG SL tablet Place 1 tablet (0.4 mg total) under the tongue every 5 (five) minutes as needed. 25 tablet 3   nortriptyline (PAMELOR) 50 MG capsule Take 1 capsule (50 mg total) by mouth at bedtime. 30 capsule 11   Semaglutide,0.25 or 0.5MG/DOS, (OZEMPIC, 0.25 OR 0.5 MG/DOSE,) 2 MG/3ML SOPN Inject 0.5 mg into the skin once a week.     Tiotropium Bromide-Olodaterol (STIOLTO RESPIMAT) 2.5-2.5 MCG/ACT AERS Inhale 2 puffs into the lungs daily. 4 g 5   No current facility-administered medications for this visit.    OBJECTIVE: White woman in no acute distress  Vitals:   09/17/22 1510  BP:  139/71  Pulse: 79  Resp: 16  Temp: 98.1 F (36.7 C)  SpO2: 96%  Body mass index is 28.19 kg/m.   Wt Readings from Last 3 Encounters:  09/17/22 180 lb (81.6 kg)  09/04/22 180 lb (81.6 kg)  06/27/22 183 lb (83 kg)      ECOG FS:1 - Symptomatic but completely ambulatory  Sclerae unicteric, EOMs intact Wearing a mask No cervical or supraclavicular adenopathy Lungs no rales or rhonchi Heart regular rate and rhythm Abd soft, obese, nontender, positive bowel sounds MSK no focal spinal tenderness, no upper extremity lymphedema Neuro: nonfocal, well oriented, appropriate affect Breasts: The right breast has undergone lumpectomy.  There is no evidence of recurrence.  The left breast and both axillae are benign.   LAB RESULTS:  CMP     Component Value Date/Time   NA 137 08/19/2021 1206   K 4.3 08/19/2021 1206   CL 104 08/19/2021 1206   CO2 21 (L) 08/19/2021 1206   GLUCOSE 274 (H) 08/19/2021 1206   BUN 10 08/19/2021 1206   CREATININE 1.18 (H) 08/19/2021 1206   CREATININE 1.20 (H) 11/10/2017 1504   CALCIUM 8.9 08/19/2021 1206   PROT 7.5 08/19/2021 1206   PROT 6.6 08/19/2019 0805   ALBUMIN 3.8 08/19/2021 1206   ALBUMIN 4.2 08/19/2019 0805   AST 15 08/19/2021 1206   AST 10 11/10/2017 1504   ALT 18 08/19/2021 1206   ALT 10 11/10/2017 1504   ALKPHOS 90 08/19/2021 1206   BILITOT 0.2 (L) 08/19/2021 1206   BILITOT 0.2 08/19/2019 0805   BILITOT 0.3 11/10/2017 1504   GFRNONAA 49 (L) 08/19/2021 1206   GFRNONAA 45 (L) 11/10/2017 1504   GFRAA 52 (L) 11/10/2017 1504    Lab Results  Component Value Date   WBC 8.1 09/17/2022   NEUTROABS 4.7 09/17/2022   HGB 13.4 09/17/2022   HCT 41.1 09/17/2022   MCV 91.3 09/17/2022   PLT 293 09/17/2022   No results found for: "LABCA2"  No components found for: "LNLGXQ119"  No results for input(s): "INR" in the last 168 hours.  No results found for: "LABCA2"  No results found for: "ERD408"  No results found for: "CAN125"  No  results found for: "CAN153"  No results found for: "CA2729"  No components found for: "HGQUANT"  No results found for: "CEA1", "CEA" / No results found for: "CEA1", "CEA"   No results found for: "AFPTUMOR"  No results found for: "CHROMOGRNA"  Lab Results  Component Value Date   TOTALPROTELP 7.4 09/22/2013   ALBUMINELP 61.6 09/22/2013   A1GS 3.4 09/22/2013   A2GS 11.5 09/22/2013   BETS 7.1 09/22/2013   BETA2SER 4.2 09/22/2013   GAMS 12.2 09/22/2013   MSPIKE NOT DET 09/22/2013   SPEI  09/22/2013     Comment:     The possibility of a faint restricted band(s) cannot be completely excluded in the gamma region.  Suggest serum IFE to evaluate possibility, if clinically indicated. (Lab will hold sample one week. Please call Customer Service at [phone] to add test.) Reviewed by Odis Hollingshead, MD, PhD, FCAP (Electronic Signature on File)   (this displays SPEP labs)  Lab Results  Component Value Date   KAPLAMBRATIO 8.29 09/22/2013   (kappa/lambda light chains)  No results found for: "HGBA", "HGBA2QUANT", "HGBFQUANT", "HGBSQUAN" (Hemoglobinopathy evaluation)   No results found for: "LDH"  No results found for: "IRON", "TIBC", "IRONPCTSAT" (Iron and TIBC)  No results found for: "FERRITIN"  Urinalysis    Component Value Date/Time   COLORURINE YELLOW 06/05/2017 0052   APPEARANCEUR HAZY (A) 06/05/2017 0052   LABSPEC  1.015 06/05/2017 0052   PHURINE 5.0 06/05/2017 0052   GLUCOSEU NEGATIVE 06/05/2017 0052   HGBUR NEGATIVE 06/05/2017 0052   BILIRUBINUR NEGATIVE 06/05/2017 0052   KETONESUR NEGATIVE 06/05/2017 0052   PROTEINUR NEGATIVE 06/05/2017 0052   NITRITE NEGATIVE 06/05/2017 0052   LEUKOCYTESUR MODERATE (A) 06/05/2017 0052    STUDIES: No results found.   ELIGIBLE FOR AVAILABLE RESEARCH PROTOCOL: no  ASSESSMENT: 77 y.o. [place] woman status post right breast biopsy 10/01/2017 showing lobular carcinoma in situ and atypical ductal  hyperplasia  (1) right lumpectomy 10/27/2017 shows focal atypical ductal hyperplasia  (2) additional cancer risk factors:   (a) family history of breast and ovarian cancer  (b) breast density category C  (3) anastrozole started 11/09/2017  (a) baseline bone density reportedly normal  (4) intensified screening   (a) breast MRI 11/30/2017 showed no evidence of malignancy  (b) intensified screening discontinued after November 2020 for financial reasons  (5) genetics testing 01/27/2018 identified a 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 STK11, TP53, TSC1, TSC2, and VHL.  The following genes were evaluated for sequence changes only: SDHA and HOXB13 c.251G>A variant only.   PLAN:  Samaya is coming up on 4 years from her lumpectomy showing lobular carcinoma in situ.  The plan is to continue anastrozole for total of 5 years.  I have gone ahead and refill that for her through [facility] and hopefully as she will receive it but if she does not she will call and let us know.  She will have her mammogram next week and then again a year from next week so I am making her a return appointment here later in December 2023 so she will have had her mammogram that year prior to that visit.  At that point she can consider graduating.  She will get her "C.B.H." degree at that time.  She tells me that stands for "clean bill of health".  Total encounter time 20 minutes.*  Magrinat, [name], MD  09/17/22  3:32 PM Medical Oncology and Hematology [facility] [place], [place] [address] Tel. [phone]    Fax. [phone]    I, Wilburn Mylar, am acting as scribe for Dr. [name]. Magrinat.  I, Lurline Del MD, have reviewed the above documentation for accuracy and completeness, and I agree with the above.   *Total Encounter Time as defined by the Centers for Medicare and Medicaid Services includes, in addition to the face-to-face time of a patient visit (documented in the note above) non-face-to-face time: obtaining and reviewing outside history, ordering and reviewing medications, tests or procedures, care coordination (communications with other health care professionals or caregivers) and documentation in the medical record.

## 2022-09-18 ENCOUNTER — Encounter: Payer: Self-pay | Admitting: Hematology and Oncology

## 2022-09-19 ENCOUNTER — Telehealth: Payer: Self-pay | Admitting: Pharmacy Technician

## 2022-09-19 DIAGNOSIS — Z596 Low income: Secondary | ICD-10-CM

## 2022-09-19 NOTE — Progress Notes (Signed)
[facility])                                            [place] Team    09/19/2022  Melanie [name] 11/29/1945 [id number]  Care coordination call placed to [facility] in regard to Insulin Degludec and Ozempic application.  Spoke to [place] who informs patient is APPROVED 09/15/22-09/15/23. Patient's medication will be delivered to the prescriber's office based on last fill date in 2023.  Devanie Galanti P. Glenn Christo, [place]  [phone]

## 2022-09-23 ENCOUNTER — Other Ambulatory Visit: Payer: Self-pay | Admitting: Hematology and Oncology

## 2022-09-23 ENCOUNTER — Other Ambulatory Visit: Payer: Self-pay | Admitting: Cardiovascular Disease

## 2022-09-23 ENCOUNTER — Ambulatory Visit: Payer: Medicare HMO

## 2022-09-25 ENCOUNTER — Ambulatory Visit
Admission: RE | Admit: 2022-09-25 | Discharge: 2022-09-25 | Disposition: A | Discharge status: Home / Self Care | Payer: Medicare HMO | Source: Ambulatory Visit | Attending: Internal Medicine | Admitting: Internal Medicine

## 2022-09-25 DIAGNOSIS — Z1231 Encounter for screening mammogram for malignant neoplasm of breast: Secondary | ICD-10-CM | POA: Diagnosis not present

## 2022-09-26 ENCOUNTER — Other Ambulatory Visit: Payer: Self-pay | Admitting: Neurology

## 2022-09-29 ENCOUNTER — Other Ambulatory Visit (HOSPITAL_COMMUNITY): Payer: Self-pay

## 2022-09-29 ENCOUNTER — Telehealth: Payer: Self-pay

## 2022-09-29 NOTE — Telephone Encounter (Signed)
Pharmacy Patient Advocate Encounter   Received notification from [facility] that prior authorization for Praluent '75mg'$ /ml is required/requested.    PA submitted on 1.15.24 to (ins) [employer] via CoverMyMeds Key [id number]  Status is pending

## 2022-09-30 ENCOUNTER — Other Ambulatory Visit (HOSPITAL_COMMUNITY): Payer: Self-pay

## 2022-09-30 ENCOUNTER — Telehealth: Payer: Self-pay

## 2022-09-30 MED ORDER — REPATHA SURECLICK 140 MG/ML ~~LOC~~ SOAJ: 1.0000 mL | SUBCUTANEOUS | 11 refills | Status: DC

## 2022-09-30 NOTE — Telephone Encounter (Signed)
Advised pt that her formulary has changed and Repatha is now preferred. Pt is ok with the switch. Rx sent to [facility].

## 2022-09-30 NOTE — Telephone Encounter (Signed)
Pharmacy Patient Advocate Encounter  Received notification from [facility] that the request for prior authorization for Praluent '75mg'$ /ml has been denied due to the medication nolonger being on the patients formulary.     However;  Repatha '140mg'$ /ml sureclick has been Approved and is currently on the patients formuary. Please please in a active script to have on profile, if appropriate.

## 2022-09-30 NOTE — Telephone Encounter (Signed)
Pharmacy Patient Advocate Encounter  Prior Authorization for Repatha '140mg'$ /ml sureclick has been approved.    Key: [id number] Effective dates: 09/15/22 through 09/15/23  Spoke with Pharmacy to process.

## 2022-10-01 ENCOUNTER — Other Ambulatory Visit (HOSPITAL_COMMUNITY): Payer: Self-pay

## 2022-10-03 NOTE — Telephone Encounter (Signed)
Patient is following up. She states she does not remember her medication being switched and her PCP would like to know the reasoning behind the switch as well. Please advise.

## 2022-10-16 DIAGNOSIS — E1129 Type 2 diabetes mellitus with other diabetic kidney complication: Secondary | ICD-10-CM | POA: Diagnosis not present

## 2022-10-16 DIAGNOSIS — E538 Deficiency of other specified B group vitamins: Secondary | ICD-10-CM | POA: Diagnosis not present

## 2022-11-05 DIAGNOSIS — E785 Hyperlipidemia, unspecified: Secondary | ICD-10-CM | POA: Diagnosis not present

## 2022-11-05 DIAGNOSIS — R7989 Other specified abnormal findings of blood chemistry: Secondary | ICD-10-CM | POA: Diagnosis not present

## 2022-11-05 DIAGNOSIS — I10 Essential (primary) hypertension: Secondary | ICD-10-CM | POA: Diagnosis not present

## 2022-11-05 DIAGNOSIS — E538 Deficiency of other specified B group vitamins: Secondary | ICD-10-CM | POA: Diagnosis not present

## 2022-11-05 DIAGNOSIS — E1129 Type 2 diabetes mellitus with other diabetic kidney complication: Secondary | ICD-10-CM | POA: Diagnosis not present

## 2022-11-12 DIAGNOSIS — J449 Chronic obstructive pulmonary disease, unspecified: Secondary | ICD-10-CM | POA: Diagnosis not present

## 2022-11-12 DIAGNOSIS — Z1331 Encounter for screening for depression: Secondary | ICD-10-CM | POA: Diagnosis not present

## 2022-11-12 DIAGNOSIS — N1831 Chronic kidney disease, stage 3a: Secondary | ICD-10-CM | POA: Diagnosis not present

## 2022-11-12 DIAGNOSIS — I25119 Atherosclerotic heart disease of native coronary artery with unspecified angina pectoris: Secondary | ICD-10-CM | POA: Diagnosis not present

## 2022-11-12 DIAGNOSIS — E1129 Type 2 diabetes mellitus with other diabetic kidney complication: Secondary | ICD-10-CM | POA: Diagnosis not present

## 2022-11-12 DIAGNOSIS — R82998 Other abnormal findings in urine: Secondary | ICD-10-CM | POA: Diagnosis not present

## 2022-11-12 DIAGNOSIS — I129 Hypertensive chronic kidney disease with stage 1 through stage 4 chronic kidney disease, or unspecified chronic kidney disease: Secondary | ICD-10-CM | POA: Diagnosis not present

## 2022-11-12 DIAGNOSIS — Z23 Encounter for immunization: Secondary | ICD-10-CM | POA: Diagnosis not present

## 2022-11-12 DIAGNOSIS — I700 Atherosclerosis of aorta: Secondary | ICD-10-CM | POA: Diagnosis not present

## 2022-11-12 DIAGNOSIS — Z1339 Encounter for screening examination for other mental health and behavioral disorders: Secondary | ICD-10-CM | POA: Diagnosis not present

## 2022-11-12 DIAGNOSIS — Z0000 Encounter for general adult medical examination without abnormal findings: Secondary | ICD-10-CM | POA: Diagnosis not present

## 2022-11-12 DIAGNOSIS — I2510 Atherosclerotic heart disease of native coronary artery without angina pectoris: Secondary | ICD-10-CM | POA: Diagnosis not present

## 2022-11-12 DIAGNOSIS — G720 Drug-induced myopathy: Secondary | ICD-10-CM | POA: Diagnosis not present

## 2022-11-12 DIAGNOSIS — E1149 Type 2 diabetes mellitus with other diabetic neurological complication: Secondary | ICD-10-CM | POA: Diagnosis not present

## 2022-11-17 ENCOUNTER — Other Ambulatory Visit: Payer: Self-pay | Admitting: Cardiovascular Disease

## 2022-11-28 DIAGNOSIS — E1129 Type 2 diabetes mellitus with other diabetic kidney complication: Secondary | ICD-10-CM | POA: Diagnosis not present

## 2022-12-04 DIAGNOSIS — H25013 Cortical age-related cataract, bilateral: Secondary | ICD-10-CM | POA: Diagnosis not present

## 2022-12-04 DIAGNOSIS — H2513 Age-related nuclear cataract, bilateral: Secondary | ICD-10-CM | POA: Diagnosis not present

## 2022-12-04 DIAGNOSIS — H04123 Dry eye syndrome of bilateral lacrimal glands: Secondary | ICD-10-CM | POA: Diagnosis not present

## 2022-12-04 DIAGNOSIS — H524 Presbyopia: Secondary | ICD-10-CM | POA: Diagnosis not present

## 2022-12-22 ENCOUNTER — Other Ambulatory Visit: Payer: Self-pay | Admitting: Hematology and Oncology

## 2022-12-22 ENCOUNTER — Other Ambulatory Visit: Payer: Self-pay | Admitting: Neurology

## 2022-12-29 DIAGNOSIS — E1129 Type 2 diabetes mellitus with other diabetic kidney complication: Secondary | ICD-10-CM | POA: Diagnosis not present

## 2023-01-29 DIAGNOSIS — E1129 Type 2 diabetes mellitus with other diabetic kidney complication: Secondary | ICD-10-CM | POA: Diagnosis not present

## 2023-02-05 ENCOUNTER — Telehealth: Payer: Self-pay | Admitting: Cardiovascular Disease

## 2023-02-05 DIAGNOSIS — E538 Deficiency of other specified B group vitamins: Secondary | ICD-10-CM | POA: Diagnosis not present

## 2023-02-05 NOTE — Telephone Encounter (Signed)
*  STAT* If patient is at the pharmacy, call can be transferred to refill team.   1. Which medications need to be refilled? (please list name of each medication and dose if known)   Evolocumab (REPATHA SURECLICK) 140 MG/ML SOAJ    2. Which pharmacy/location (including street and city if local pharmacy) is medication to be sent to?   [facility] - [place], [place] - [address] Phone: [phone]  Fax: [phone]      3. Do they need a 30 day or 90 day supply? 90

## 2023-02-06 MED ORDER — REPATHA SURECLICK 140 MG/ML ~~LOC~~ SOAJ: 1.0000 mL | SUBCUTANEOUS | 3 refills | Status: DC

## 2023-02-06 NOTE — Telephone Encounter (Signed)
I called and spoke with pt as I saw an old phone note asking about why patient was switched from Praluent to Repatha.  I had previously called patient and explained the change.  I called patient to let her know that I [facility] and discussed that her insurance no longer pays for Praluent but will pay for Repatha.  She asked if her cost would be $0.  I stated that I was unsure did she have a grant which she stated she thought she did but she was previously getting medication from a different pharmacy.  Advised that I would call [facility] to make sure they have the correct information.  CARD NO. [id number]   CARD STATUS Active   BIN [id number]   PCN [id number]   PC GROUP [id number]  Called [employer] info into [facility].Pt made aware and was grateful for the help.

## 2023-03-01 DIAGNOSIS — E1129 Type 2 diabetes mellitus with other diabetic kidney complication: Secondary | ICD-10-CM | POA: Diagnosis not present

## 2023-04-01 DIAGNOSIS — E1129 Type 2 diabetes mellitus with other diabetic kidney complication: Secondary | ICD-10-CM | POA: Diagnosis not present

## 2023-04-16 DIAGNOSIS — E538 Deficiency of other specified B group vitamins: Secondary | ICD-10-CM | POA: Diagnosis not present

## 2023-04-28 DIAGNOSIS — F3342 Major depressive disorder, recurrent, in full remission: Secondary | ICD-10-CM | POA: Diagnosis not present

## 2023-04-28 DIAGNOSIS — Z794 Long term (current) use of insulin: Secondary | ICD-10-CM | POA: Diagnosis not present

## 2023-04-28 DIAGNOSIS — I129 Hypertensive chronic kidney disease with stage 1 through stage 4 chronic kidney disease, or unspecified chronic kidney disease: Secondary | ICD-10-CM | POA: Diagnosis not present

## 2023-04-28 DIAGNOSIS — E1129 Type 2 diabetes mellitus with other diabetic kidney complication: Secondary | ICD-10-CM | POA: Diagnosis not present

## 2023-04-28 DIAGNOSIS — N1831 Chronic kidney disease, stage 3a: Secondary | ICD-10-CM | POA: Diagnosis not present

## 2023-04-28 DIAGNOSIS — E785 Hyperlipidemia, unspecified: Secondary | ICD-10-CM | POA: Diagnosis not present

## 2023-04-28 DIAGNOSIS — J449 Chronic obstructive pulmonary disease, unspecified: Secondary | ICD-10-CM | POA: Diagnosis not present

## 2023-04-28 DIAGNOSIS — M8580 Other specified disorders of bone density and structure, unspecified site: Secondary | ICD-10-CM | POA: Diagnosis not present

## 2023-05-02 DIAGNOSIS — E1129 Type 2 diabetes mellitus with other diabetic kidney complication: Secondary | ICD-10-CM | POA: Diagnosis not present

## 2023-05-12 ENCOUNTER — Encounter: Payer: Self-pay | Admitting: Neurology

## 2023-05-21 DIAGNOSIS — E538 Deficiency of other specified B group vitamins: Secondary | ICD-10-CM | POA: Diagnosis not present

## 2023-06-02 DIAGNOSIS — E1129 Type 2 diabetes mellitus with other diabetic kidney complication: Secondary | ICD-10-CM | POA: Diagnosis not present

## 2023-06-11 ENCOUNTER — Telehealth: Payer: Self-pay | Admitting: Neurology

## 2023-06-11 NOTE — Telephone Encounter (Signed)
Called patient and she stated that she cannot walk and every 2-3 minutes she is having  big muscle spasms. Patient stated that it starts half way between her right knee and ankle, on back side of leg. Also goes down to the side of the foot. Patient states that It feels like a electrical shock and is very painful and says she has never had anything like this happen. Muscle spasms began yesterday after taking a trip to [facility] and has not stopped and kept her up all night.  Patient stated that she is still taking nortriptyline to 50mg  at bedtime, gabapentin 900mg  three times daily, and Cymbalta 60 mg from her PCP.

## 2023-06-11 NOTE — Telephone Encounter (Signed)
Patient needs to be seen in the office as this is a new complaint.  OK to add on 10/1 at 10:10a.  She may need to go to urgent care of pain and cramps not improve.

## 2023-06-11 NOTE — Telephone Encounter (Signed)
Pt called in stating she has been having electric muscle spasms in her leg. She has almost fallen several times this morning. She would like to speak with someone

## 2023-06-11 NOTE — Telephone Encounter (Signed)
Called patient and informed her that per Dr. Allena Katz  Patient needs to be seen in the office as this is a new complaint.  OK to add on 10/1 at 10:10a. Patient is OK with that appointment time. Also informed patient that she may need to go to urgent care of pain and cramps not improve.   Patient verbalized understanding and had no further questions or concerns.

## 2023-06-16 ENCOUNTER — Ambulatory Visit: Payer: Medicare HMO | Admitting: Neurology

## 2023-06-29 ENCOUNTER — Ambulatory Visit: Payer: Medicare HMO | Admitting: Neurology

## 2023-06-30 ENCOUNTER — Telehealth: Payer: Self-pay | Admitting: Pharmacist

## 2023-06-30 NOTE — Progress Notes (Addendum)
06/30/2023  Patient ID: Melanie [name], female   DOB: 1945/11/12, 77 y.o.   MRN: [id number]     Reason for referral: medication assistance  Referral source: 2025 Re-enrollment Referral medication(s): Kathalene Frames, Siolto Current insurance:[employer]    Objective: Allergies  Allergen Reactions   Fenofibrate Other (See Comments)    "muscle spasms"   Breztri Aerosphere [Budeson-Glycopyrrol-Formoterol] Other (See Comments)    Face turned red all over.    Metoprolol Other (See Comments)    Headache   Rosuvastatin Other (See Comments)    Muscle aches    Medications Reviewed Today     Reviewed by Beecher Mcardle, [facility] (Pharmacist) on 06/30/23 at 1250  Med List Status: <None>   Medication Order Taking? Sig Documenting Provider Last Dose Status Informant  albuterol (VENTOLIN HFA) 108 (90 Base) MCG/ACT inhaler [id number] Yes 1 puff as needed Byrum, Les Pou, MD Taking Active   amLODipine (NORVASC) 10 MG tablet [id number] Yes Take 10 mg by mouth daily. [provider] Taking Active   aspirin EC 81 MG tablet [id number] Yes Take 81 mg by mouth daily.  [provider] Taking Active Self  bisoprolol (ZEBETA) 5 MG tablet [id number] Yes TAKE ONE TABLET BY MOUTH ONCE DAILY Nahser, Deloris Ping, MD Taking Active   chlorhexidine (PERIDEX) 0.12 % solution [id number] Yes SMARTSIG:By Mouth [provider] Taking Active   Cyanocobalamin (VITAMIN B-12 IJ) [id number] Yes Inject 1,000 Units as directed every 30 (thirty) days. [provider] Taking Active   DULoxetine (CYMBALTA) 60 MG capsule [id number] Yes Take 60 mg by mouth daily. [provider] Taking Active   Evolocumab [facility] SURECLICK) 140 MG/ML Ivory Broad [id number] Yes Inject 140 mg into the skin every 14 (fourteen) days. Nahser, Deloris Ping, MD Taking Active   gabapentin (NEURONTIN) 300 MG capsule [id number] Yes TAKE THREE CAPSULES BY MOUTH EVERY MORNING and TAKE THREE CAPSULES BY MOUTH AT NOON and TAKE THREE  CAPSULES BY MOUTH EVERYDAY AT BEDTIME Nita Sickle K, DO Taking Active   insulin degludec (TRESIBA) 100 UNIT/ML FlexTouch Pen [id number] Yes Inject 72 Units into the skin in the morning. [provider] Taking Active   lisinopril (ZESTRIL) 40 MG tablet [id number] Yes Take 20 mg by mouth daily. [provider] Taking Active   metFORMIN (GLUCOPHAGE-XR) 500 MG 24 hr tablet [id number] Yes Take 500 mg by mouth every morning. [provider] Taking Active   nitroGLYCERIN (NITROSTAT) 0.4 MG SL tablet [id number] Yes Place 1 tablet (0.4 mg total) under the tongue every 5 (five) minutes as needed. Tereso Newcomer T, PA-C Taking Active   nortriptyline (PAMELOR) 50 MG capsule [id number] Yes Take 1 capsule (50 mg total) by mouth at bedtime. Nita Sickle K, DO Taking Active   omeprazole (PRILOSEC) 40 MG capsule [id number] Yes Take 40 mg by mouth every morning. [provider] Taking Active   Semaglutide,0.25 or 0.5MG /DOS, (OZEMPIC, 0.25 OR 0.5 MG/DOSE,) 2 MG/3ML SOPN [id number] Yes Inject 0.5 mg into the skin once a week. [provider] Taking Active Self  Tiotropium Bromide-Olodaterol (STIOLTO RESPIMAT) 2.5-2.5 MCG/ACT AERS [id number] Yes Inhale 2 puffs into the lungs daily. Leslye Peer, MD Taking Active             Assessment:  Medication Assistance Findings:  Medication assistance needs identified: New Address:  [address]  [place], [place] [address]  Spoke with Patient via telephone. HIPAA identifiers were obtained.  Patient confirmed she received Stiolto, Ozempic, [place] and Repatha through Patient Assistance Programs this year  and will need assistance next year.  Repatha is avaiable through foundations like [employer] or [employer]. Foundations usually approve Patients for a calendar year versus per benefit year.  She will be sent applications for Ozempic, [place] and [employer].                                  Additional medication assistance options reviewed with  patient as warranted:  No other options identified  Plan: I will route patient assistance letter to [facility] pharmacy technician who will coordinate patient assistance program application process for medications listed above.  [facility] pharmacy technician will assist with obtaining all required documents from both patient and provider(s) and submit application(s) once completed.      Beecher Mcardle, PharmD, BCACP [facility] Clinical Pharmacist [phone]

## 2023-07-01 ENCOUNTER — Other Ambulatory Visit: Payer: Self-pay

## 2023-07-01 ENCOUNTER — Encounter: Payer: Self-pay | Admitting: Emergency Medicine

## 2023-07-01 ENCOUNTER — Ambulatory Visit: Payer: Medicare HMO | Admitting: Emergency Medicine

## 2023-07-01 VITALS — BP 123/71 | HR 79 | Temp 98.2°F | Ht 67.0 in | Wt 183.0 lb

## 2023-07-01 DIAGNOSIS — Z720 Tobacco use: Secondary | ICD-10-CM | POA: Diagnosis not present

## 2023-07-01 DIAGNOSIS — J449 Chronic obstructive pulmonary disease, unspecified: Secondary | ICD-10-CM

## 2023-07-01 NOTE — Patient Instructions (Signed)
Please continue Stiolto 2 puffs once daily. Keep albuterol available to use 2 puffs when needed for shortness of breath, chest tightness, wheezing. We will repeat your lung cancer screening CT chest to compare with priors. Get your flu shot at Dr. Alver Fisher office as planned Work on decreasing your cigarettes.  Ultimate goal will be to stop altogether. Follow Dr. Delton Coombes in 1 year, sooner if you have any problems.

## 2023-07-01 NOTE — Progress Notes (Signed)
Subjective:    Patient ID: Melanie [name], female    DOB: [birth year], 77 y.o.   MRN: [id number]  HPI  ROV 12/27/21 --follow-up visit for 77 year old woman with history of tobacco use.  I saw her about 1 month ago for symptoms consistent with COPD and probable acute exacerbation.  I treated her with doxycycline for presumed bronchitis and changed her Breo to [place] to see if she would get more benefit. She was unable to take the [facility] - caused facial flushing and discomfort  Lung cancer screening CT chest done 12/02/2021 reviewed by me shows some centrilobular emphysema, calcified left lower lobe granuloma, noncalcified pulmonary nodules scattered largest 4 mm.  RADS 2 study.  Pulmonary function testing performed today and reviewed by me show spirometry with mixed obstruction and restriction with moderately severe decrease in FEV1, no bronchodilator response, normal lung volumes (suspect pseudonormalization), decreased diffusion capacity that corrects to the normal range when adjusted for alveolar volume.  ROV 06/25/22 --[name] is 77 with a history of active tobacco use (40+ pack years) and COPD.  Her spirometry shows mixed restriction and obstruction.  We have been managing her Stiolto.  She has albuterol which she uses approximately.  She participates in lung cancer screening program and is due for her repeat in March 2024. Today she reports that she has overall felt well, but did have more SOB during the high heat. She does the stiolto 1 puff bid. Albuterol use is now minimal now that the weather has cooled off. Minimal cough. She is able to exert, do her daily activities. Still smoking about 10 a day. No flares, no abx or pred.  Getting the flu shot w PCP  ROV 07/01/2023 --follow-up visit 77 year old woman, active smoker with COPD.  Also with superimposed restrictive disease.  We have been treating her with Stiolto.  She continues to smoke approximately 10 cig a day.  She reports  today that she is doing well, feels that she has been able to be active, has watched her grandchildren. She can mow her yard, has to stop to rest due to leg neuropathy but not breathing. No cough. No wheeze. No pred or abx.  She has participated lung cancer screening program, did not have her scan done in March of this year.    Review of Systems As per HPI      Objective:   Physical Exam  Vitals:   07/01/23 1440 07/01/23 1445  BP:  123/71  Pulse:  79  Temp:  98.2 F (36.8 C)  TempSrc:  Oral  SpO2:  96%  Weight: 183 lb (83 kg) 183 lb (83 kg)  Height: 5\' 7"  (1.702 m) 5\' 7"  (1.702 m)    Gen: Pleasant, overwt woman, in no distress,  normal affect  ENT: No lesions,  mouth clear,  oropharynx clear, no postnasal drip  Neck: No JVD, no stridor  Lungs: No use of accessory muscles, distant, no wheeze on normal breath or on a forced exp  Cardiovascular: RRR, heart sounds normal, no murmur or gallops, no peripheral edema  Musculoskeletal: No deformities, no cyanosis or clubbing  Neuro: alert, awake, non focal  Skin: Warm, no lesions or rash     Assessment & Plan:  COPD (chronic obstructive pulmonary disease) (HCC) Overall doing well with good functional capacity, no flares.  Plan to continue same regimen.  Did speak with her today about the importance of smoking cessation.  She is smoking 10 cigarettes daily, does not believe she  is in a position right now to try to quit.  She is going to get flu shot with Dr. Clelia Croft.  Tobacco use Overdue for lung cancer screening CT chest.  Will order today    Levy Pupa, MD, PhD 07/01/2023, 3:22 PM [facility] [phone] or if no answer before 7:00PM call [phone] For any issues after 7:00PM please call eLink [phone]

## 2023-07-01 NOTE — Assessment & Plan Note (Addendum)
Overall doing well with good functional capacity, no flares.  Plan to continue same regimen.  Did speak with her today about the importance of smoking cessation.  She is smoking 10 cigarettes daily, does not believe she is in a position right now to try to quit.  She is going to get flu shot with Dr. Clelia Croft.

## 2023-07-01 NOTE — Assessment & Plan Note (Signed)
Overdue for lung cancer screening CT chest.  Will order today

## 2023-07-02 MED ORDER — NORTRIPTYLINE HCL 50 MG PO CAPS: 50.0000 mg | ORAL_CAPSULE | Freq: Every day | ORAL | 11 refills | Status: DC

## 2023-07-02 MED ORDER — GABAPENTIN 300 MG PO CAPS: ORAL_CAPSULE | ORAL | 1 refills | Status: DC

## 2023-07-03 ENCOUNTER — Telehealth: Payer: Self-pay | Admitting: Neurology

## 2023-07-03 DIAGNOSIS — E1129 Type 2 diabetes mellitus with other diabetic kidney complication: Secondary | ICD-10-CM | POA: Diagnosis not present

## 2023-07-03 NOTE — Telephone Encounter (Signed)
Called patient and informed her that her medication was sent in to her pharmacy yesterday 07/02/23. Patient stated she will contact her pharmacy and let us know if she any issues.

## 2023-07-03 NOTE — Telephone Encounter (Signed)
Needs a refill on Gabapentin 300mg , Nortiptyline 50 mg . Will be out after today. Please send to [facility] on [address] in [place]

## 2023-07-07 DIAGNOSIS — E538 Deficiency of other specified B group vitamins: Secondary | ICD-10-CM | POA: Diagnosis not present

## 2023-07-07 DIAGNOSIS — Z23 Encounter for immunization: Secondary | ICD-10-CM | POA: Diagnosis not present

## 2023-07-10 ENCOUNTER — Telehealth: Payer: Self-pay | Admitting: Pharmacy Technician

## 2023-07-10 DIAGNOSIS — Z5986 Financial insecurity: Secondary | ICD-10-CM

## 2023-07-10 NOTE — Progress Notes (Signed)
[facility] Customer service manager [facility])                                            [facility] Quality Pharmacy Team    07/10/2023  DAIZEE KOVACK 1945-12-06 [id number]                                      Medication Assistance Referral  Referral From: [facility] R[facility] Katina B.  Medication/Company: Franki Monte / [employer] Patient application portion:  Mining engineer portion: Faxed  to Dr. Martha Clan Provider address/fax verified via: Office website  Medication/Company: Evaristo Bury / [employer] Patient application portion:  Mining engineer portion: Faxed  to Dr. Dayna Ramus Provider address/fax verified via: Office website  Medication/Company: Jaynie Crumble / [employer] Patient application portion:  Mailed Provider application portion: Faxed  to Dr. Donata Clay, CPhT [place]  Office: [phone] Fax: [phone] Email: [email or web address]  Provider address/fax verified via: Office website

## 2023-07-15 ENCOUNTER — Ambulatory Visit
Admission: RE | Admit: 2023-07-15 | Discharge: 2023-07-15 | Disposition: A | Discharge status: Home / Self Care | Payer: Medicare HMO | Source: Ambulatory Visit | Attending: Emergency Medicine | Admitting: Emergency Medicine

## 2023-07-15 DIAGNOSIS — F17210 Nicotine dependence, cigarettes, uncomplicated: Secondary | ICD-10-CM | POA: Diagnosis not present

## 2023-07-15 DIAGNOSIS — Z720 Tobacco use: Secondary | ICD-10-CM

## 2023-07-22 ENCOUNTER — Encounter: Payer: Self-pay | Admitting: Neurology

## 2023-07-22 ENCOUNTER — Ambulatory Visit: Payer: Medicare HMO | Admitting: Neurology

## 2023-07-22 VITALS — BP 121/70 | HR 78 | Ht 67.0 in | Wt 184.0 lb

## 2023-07-22 DIAGNOSIS — R278 Other lack of coordination: Secondary | ICD-10-CM | POA: Diagnosis not present

## 2023-07-22 DIAGNOSIS — E1142 Type 2 diabetes mellitus with diabetic polyneuropathy: Secondary | ICD-10-CM

## 2023-07-22 NOTE — Patient Instructions (Signed)
Start to use a cane  If you would like to start physical therapy for balance, please let me know

## 2023-07-22 NOTE — Progress Notes (Signed)
Follow-up Visit   Date: 07/22/23    Melanie [name] MRN: [id number] DOB: [birth year]   Interim History: Melanie [name] is a 77 y.o. 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   IMPRESSION/PLAN: Diabetic neuropathy affecting the feet and lower legs with numbness and painful paresthesias.   She is having worsening balance and there is sensory ataxia on exam.  - Continue nortriptyline 50mg  at bedtime  - Continue gabapentin 900mg  three times daily  - She is also taking Cymbalta 60mg  for depression, prescribed by PCP  - Physical therapy for balance training  - Encouraged to start using a cane  - Patient educated on daily foot inspection, fall prevention, and safety precautions around the home.  Return to clinic in 1 year  ------------------------------------------------------------ UPDATE 06/27/2022:  She is here for follow-up visit.  Her neuropathy seems a little worse.  She continues to have numbness from the knees down and has sharp episodic pain in the legs.  Pain is better on gabapentin 900mg  TID and nortriptyline 40mg  at bedtime. She endorses dry mouth. She ran out of her medications for a day and pain was significantly worse.  No falls or weakness. She lives in a one-level home with a walk-in shower. She was hospitalized in February for pneumonia.   She also taking Cymbalta 60mg  daily for depression, prescribed by her PCP.   UPDATE 07/22/2023:  She is here for 1 year follow-up visit.  She has noticed that her balance is getting worse.  Fortunately, no falls.  She is not using a cane. She has low back pain with bending and stooping, walking long distances.  She occasionally has cramps in the calf.  She continues to have numbness in the feet and shooting  pain in the legs.     Medications:  Current Outpatient Medications on File Prior to Visit  Medication Sig Dispense Refill   albuterol (VENTOLIN HFA) 108 (90 Base) MCG/ACT inhaler 1 puff as needed 8 g 1   amLODipine (NORVASC) 10 MG tablet Take 10 mg by mouth daily.     aspirin EC 81 MG tablet Take 81 mg by mouth daily.      bisoprolol (ZEBETA) 5 MG tablet TAKE ONE TABLET BY MOUTH ONCE DAILY 90 tablet 3   Cyanocobalamin (VITAMIN B-12 IJ) Inject 1,000 Units as directed every 30 (thirty) days.     DULoxetine (CYMBALTA) 60 MG capsule Take 60 mg by mouth daily.     Evolocumab (REPATHA SURECLICK) 140 MG/ML SOAJ Inject 140 mg into the skin every 14 (fourteen) days. 6 mL 3   gabapentin (NEURONTIN) 300 MG capsule TAKE THREE CAPSULES BY MOUTH EVERY MORNING and TAKE THREE CAPSULES BY MOUTH AT NOON and TAKE THREE CAPSULES BY MOUTH EVERYDAY AT BEDTIME 810 capsule 1   insulin degludec (TRESIBA) 100 UNIT/ML FlexTouch Pen Inject 72 Units into the skin in the morning.     lisinopril (ZESTRIL) 40 MG tablet Take 20 mg by mouth daily.     metFORMIN (GLUCOPHAGE-XR) 500 MG 24 hr tablet Take 500 mg by mouth every morning.     nitroGLYCERIN (NITROSTAT) 0.4 MG SL tablet Place 1 tablet (0.4 mg total) under the tongue every 5 (five) minutes as needed. 25 tablet 3   nortriptyline (PAMELOR) 50 MG capsule Take 1 capsule (50 mg total) by mouth at bedtime. 30 capsule  11   omeprazole (PRILOSEC) 40 MG capsule Take 40 mg by mouth every morning.     Semaglutide,0.25 or 0.5MG /DOS, (OZEMPIC, 0.25 OR 0.5 MG/DOSE,) 2 MG/3ML SOPN Inject 0.5 mg into the skin once a week.     Tiotropium Bromide-Olodaterol (STIOLTO RESPIMAT) 2.5-2.5 MCG/ACT AERS Inhale 2 puffs into the lungs daily. 4 g 5   chlorhexidine (PERIDEX) 0.12 % solution SMARTSIG:By Mouth (Patient not taking: Reported on 07/01/2023)     No current facility-administered medications on file prior to visit.    Allergies:  Allergies  Allergen Reactions   Fenofibrate Other (See  Comments)    "muscle spasms"   Breztri Aerosphere [Budeson-Glycopyrrol-Formoterol] Other (See Comments)    Face turned red all over.    Metoprolol Other (See Comments)    Headache   Rosuvastatin Other (See Comments)    Muscle aches    Vital Signs:  BP 121/70   Pulse 78   Ht 5\' 7"  (1.702 m)   Wt 184 lb (83.5 kg)   SpO2 97%   BMI 28.82 kg/m   Neurological Exam: MENTAL STATUS including orientation to time, place, person, recent and remote memory, attention span and concentration, language, and fund of knowledge is normal.    CRANIAL NERVES: Pupils are around and reactive to light.  Extraocular muscles intact.  Face is symmetric   MOTOR:  Motor strength is 5/5 in all extremities, including distally in the feet.    MSRs:  Reflexes are 2+/4 in the upper extremities and absent in the lower extremities.   SENSORY:  Vibration is trace at the knees, absent at the ankles.  Rhomberg testing is positive.  COORDINATION/GAIT:  Gait is stable and unassisted, mildly wide-based, stooped posture  Data: MRI lumbar spine 04/03/2017: 1. Previously seen left subarticular disc protrusion L3-L4 has involuted or has been resected. There remains severe left L3-L4 neural foraminal stenosis due to foraminal disc protrusion superimposed on a diffuse disc bulge. 2. Unchanged moderate right L4-L5 neural foraminal stenosis secondary to endplate spurring. 3. Posterior decompression at L2-L4 with wide patency of the thecal sac.     Thank you for allowing me to participate in patient's care.  If I can answer any additional questions, I would be pleased to do so.    Sincerely,    Khyree Carillo K. Allena Katz, DO

## 2023-07-27 ENCOUNTER — Telehealth: Payer: Self-pay | Admitting: Emergency Medicine

## 2023-07-27 DIAGNOSIS — Z87891 Personal history of nicotine dependence: Secondary | ICD-10-CM

## 2023-07-27 DIAGNOSIS — Z122 Encounter for screening for malignant neoplasm of respiratory organs: Secondary | ICD-10-CM

## 2023-07-27 DIAGNOSIS — F17210 Nicotine dependence, cigarettes, uncomplicated: Secondary | ICD-10-CM

## 2023-07-27 NOTE — Telephone Encounter (Signed)
Calling for CT Results

## 2023-07-28 NOTE — Telephone Encounter (Signed)
Spoke with pt and advised that we have not received the CT results. I explained that we have had a delay in receiving results but that we will contact her as soon as her results are received. PT verbalized understanding.

## 2023-07-28 NOTE — Telephone Encounter (Signed)
Routed to Lung Cancer Screening program.

## 2023-08-03 DIAGNOSIS — E1129 Type 2 diabetes mellitus with other diabetic kidney complication: Secondary | ICD-10-CM | POA: Diagnosis not present

## 2023-08-06 ENCOUNTER — Other Ambulatory Visit: Payer: Self-pay | Admitting: Pharmacy Technician

## 2023-08-06 DIAGNOSIS — Z5986 Financial insecurity: Secondary | ICD-10-CM

## 2023-08-06 NOTE — Progress Notes (Signed)
Pharmacy Medication Assistance Program Note    08/06/2023  Patient ID: Melanie [name], female   DOB: Nov 22, 1945, 77 y.o.   MRN: [id number]     08/06/2023  Outreach Medication One  Manufacturer Medication One [employer]  [employer] Drugs Stiolto  Type of Radiographer, therapeutic Assistance  Date Application Received From Patient 07/22/2023  Application Items Received From Patient Application;Proof of Income;Other  Date Application Received From Provider 07/17/2023  Date Application Submitted to Manufacturer 08/05/2023  Method Application Sent to Manufacturer Fax       Signature  Kristopher Glee [facility]  Office: [phone] Fax: [phone] Email: [email or web address]

## 2023-08-17 NOTE — Telephone Encounter (Signed)
Called and spoke to pt. Informed her of the results of her LDCT from 07/15/2023, impression below. Patient is on Repatha for cholesterol and has already been informed of emphysema.     IMPRESSION: 1. Lung-RADS 2S, benign appearance or behavior. Continue annual screening with low-dose chest CT without contrast in 12 months. 2. The "S" modifier above refers to potentially clinically significant non lung cancer related findings. Specifically, there is aortic atherosclerosis, in addition to three-vessel coronary artery disease. Please note that although the presence of coronary artery calcium documents the presence of coronary artery disease, the severity of this disease and any potential stenosis cannot be assessed on this non-gated CT examination. Assessment for potential risk factor modification, dietary therapy or pharmacologic therapy may be warranted, if clinically indicated. 3. Mild diffuse bronchial wall thickening with mild centrilobular and paraseptal emphysema; imaging findings suggestive of underlying COPD.   Aortic Atherosclerosis (ICD10-I70.0) and Emphysema (ICD10-J43.9).     Electronically Signed   By: Trudie Reed M.D.   On: 08/16/2023 07:51

## 2023-08-17 NOTE — Addendum Note (Signed)
Addended by: Sheran Luz on: 08/17/2023 04:07 PM   Modules accepted: Orders

## 2023-08-18 DIAGNOSIS — G720 Drug-induced myopathy: Secondary | ICD-10-CM | POA: Diagnosis not present

## 2023-08-18 DIAGNOSIS — Z794 Long term (current) use of insulin: Secondary | ICD-10-CM | POA: Diagnosis not present

## 2023-08-18 DIAGNOSIS — I2510 Atherosclerotic heart disease of native coronary artery without angina pectoris: Secondary | ICD-10-CM | POA: Diagnosis not present

## 2023-08-18 DIAGNOSIS — N1831 Chronic kidney disease, stage 3a: Secondary | ICD-10-CM | POA: Diagnosis not present

## 2023-08-18 DIAGNOSIS — I129 Hypertensive chronic kidney disease with stage 1 through stage 4 chronic kidney disease, or unspecified chronic kidney disease: Secondary | ICD-10-CM | POA: Diagnosis not present

## 2023-08-18 DIAGNOSIS — E538 Deficiency of other specified B group vitamins: Secondary | ICD-10-CM | POA: Diagnosis not present

## 2023-08-18 DIAGNOSIS — E1129 Type 2 diabetes mellitus with other diabetic kidney complication: Secondary | ICD-10-CM | POA: Diagnosis not present

## 2023-08-20 ENCOUNTER — Other Ambulatory Visit: Payer: Self-pay | Admitting: Pharmacy Technician

## 2023-08-20 DIAGNOSIS — Z5986 Financial insecurity: Secondary | ICD-10-CM

## 2023-08-20 NOTE — Progress Notes (Signed)
Pharmacy Medication Assistance Program Note    08/20/2023  Patient ID: Melanie [name], female  DOB: [birth year], 77 y.o.  MRN:  [id number]     08/20/2023  Outreach Medication Two  Initial Outreach Date (Medication Two) 07/08/2023  Manufacturer Medication Two Novo Nordisk  [employer] Evaristo Bury  Dose of Ozempic 2mg /58ml  Dose of Tresiba 100units/ml  Type of Radiographer, therapeutic Assistance  Date Application Sent to Patient 07/08/2023  Application Items Requested Application;Proof of Income;Other  Date Application Sent to Prescriber 07/08/2023  Name of Prescriber Martha Clan  Date Application Received From Patient 07/22/2023  Application Items Received From Patient Application;Proof of Income;Other  Date Application Received From Provider 08/17/2023  Method Application Sent to Manufacturer Fax  Date Application Submitted to Manufacturer 08/19/2023       Signature Kristopher Glee Harnett  Office: [phone] Fax: [phone] Email: [email or web address]

## 2023-08-23 DIAGNOSIS — R0602 Shortness of breath: Secondary | ICD-10-CM | POA: Diagnosis not present

## 2023-08-23 DIAGNOSIS — N1831 Chronic kidney disease, stage 3a: Secondary | ICD-10-CM | POA: Diagnosis not present

## 2023-08-23 DIAGNOSIS — R918 Other nonspecific abnormal finding of lung field: Secondary | ICD-10-CM | POA: Diagnosis not present

## 2023-08-23 DIAGNOSIS — A419 Sepsis, unspecified organism: Secondary | ICD-10-CM | POA: Diagnosis not present

## 2023-08-23 DIAGNOSIS — I129 Hypertensive chronic kidney disease with stage 1 through stage 4 chronic kidney disease, or unspecified chronic kidney disease: Secondary | ICD-10-CM | POA: Diagnosis not present

## 2023-08-23 DIAGNOSIS — R739 Hyperglycemia, unspecified: Secondary | ICD-10-CM | POA: Diagnosis not present

## 2023-08-23 DIAGNOSIS — R079 Chest pain, unspecified: Secondary | ICD-10-CM | POA: Diagnosis not present

## 2023-08-23 DIAGNOSIS — E782 Mixed hyperlipidemia: Secondary | ICD-10-CM | POA: Diagnosis not present

## 2023-08-23 DIAGNOSIS — I219 Acute myocardial infarction, unspecified: Secondary | ICD-10-CM

## 2023-08-23 DIAGNOSIS — F329 Major depressive disorder, single episode, unspecified: Secondary | ICD-10-CM | POA: Diagnosis not present

## 2023-08-23 DIAGNOSIS — J168 Pneumonia due to other specified infectious organisms: Secondary | ICD-10-CM | POA: Diagnosis not present

## 2023-08-23 DIAGNOSIS — J189 Pneumonia, unspecified organism: Secondary | ICD-10-CM | POA: Diagnosis not present

## 2023-08-23 DIAGNOSIS — I517 Cardiomegaly: Secondary | ICD-10-CM | POA: Diagnosis not present

## 2023-08-23 DIAGNOSIS — I25110 Atherosclerotic heart disease of native coronary artery with unstable angina pectoris: Secondary | ICD-10-CM | POA: Diagnosis not present

## 2023-08-23 DIAGNOSIS — J440 Chronic obstructive pulmonary disease with acute lower respiratory infection: Secondary | ICD-10-CM | POA: Diagnosis not present

## 2023-08-23 DIAGNOSIS — R0989 Other specified symptoms and signs involving the circulatory and respiratory systems: Secondary | ICD-10-CM | POA: Diagnosis not present

## 2023-08-23 DIAGNOSIS — I493 Ventricular premature depolarization: Secondary | ICD-10-CM | POA: Diagnosis not present

## 2023-08-23 DIAGNOSIS — R4702 Dysphasia: Secondary | ICD-10-CM | POA: Diagnosis not present

## 2023-08-23 DIAGNOSIS — I498 Other specified cardiac arrhythmias: Secondary | ICD-10-CM | POA: Diagnosis not present

## 2023-08-23 DIAGNOSIS — I10 Essential (primary) hypertension: Secondary | ICD-10-CM | POA: Diagnosis not present

## 2023-08-23 DIAGNOSIS — Z955 Presence of coronary angioplasty implant and graft: Secondary | ICD-10-CM | POA: Diagnosis not present

## 2023-08-23 DIAGNOSIS — I447 Left bundle-branch block, unspecified: Secondary | ICD-10-CM | POA: Diagnosis not present

## 2023-08-23 DIAGNOSIS — E1122 Type 2 diabetes mellitus with diabetic chronic kidney disease: Secondary | ICD-10-CM | POA: Diagnosis not present

## 2023-08-23 DIAGNOSIS — R0902 Hypoxemia: Secondary | ICD-10-CM | POA: Diagnosis not present

## 2023-08-23 DIAGNOSIS — I480 Paroxysmal atrial fibrillation: Secondary | ICD-10-CM | POA: Diagnosis not present

## 2023-08-23 DIAGNOSIS — N183 Chronic kidney disease, stage 3 unspecified: Secondary | ICD-10-CM | POA: Diagnosis not present

## 2023-08-23 DIAGNOSIS — J90 Pleural effusion, not elsewhere classified: Secondary | ICD-10-CM | POA: Diagnosis not present

## 2023-08-23 DIAGNOSIS — R000 Tachycardia, unspecified: Secondary | ICD-10-CM | POA: Diagnosis not present

## 2023-08-23 DIAGNOSIS — I214 Non-ST elevation (NSTEMI) myocardial infarction: Secondary | ICD-10-CM | POA: Diagnosis not present

## 2023-08-23 DIAGNOSIS — I340 Nonrheumatic mitral (valve) insufficiency: Secondary | ICD-10-CM | POA: Diagnosis not present

## 2023-08-23 DIAGNOSIS — I2510 Atherosclerotic heart disease of native coronary artery without angina pectoris: Secondary | ICD-10-CM | POA: Diagnosis not present

## 2023-08-23 HISTORY — DX: Acute myocardial infarction, unspecified: I21.9

## 2023-08-24 ENCOUNTER — Telehealth: Payer: Self-pay | Admitting: Cardiovascular Disease

## 2023-08-24 DIAGNOSIS — I517 Cardiomegaly: Secondary | ICD-10-CM | POA: Diagnosis not present

## 2023-08-24 DIAGNOSIS — R000 Tachycardia, unspecified: Secondary | ICD-10-CM | POA: Diagnosis not present

## 2023-08-24 DIAGNOSIS — R0989 Other specified symptoms and signs involving the circulatory and respiratory systems: Secondary | ICD-10-CM | POA: Diagnosis not present

## 2023-08-24 DIAGNOSIS — I214 Non-ST elevation (NSTEMI) myocardial infarction: Secondary | ICD-10-CM | POA: Diagnosis not present

## 2023-08-24 DIAGNOSIS — R0602 Shortness of breath: Secondary | ICD-10-CM | POA: Diagnosis not present

## 2023-08-24 DIAGNOSIS — I447 Left bundle-branch block, unspecified: Secondary | ICD-10-CM | POA: Diagnosis not present

## 2023-08-24 DIAGNOSIS — I493 Ventricular premature depolarization: Secondary | ICD-10-CM | POA: Diagnosis not present

## 2023-08-24 DIAGNOSIS — I340 Nonrheumatic mitral (valve) insufficiency: Secondary | ICD-10-CM | POA: Diagnosis not present

## 2023-08-24 DIAGNOSIS — R0902 Hypoxemia: Secondary | ICD-10-CM | POA: Diagnosis not present

## 2023-08-24 DIAGNOSIS — J90 Pleural effusion, not elsewhere classified: Secondary | ICD-10-CM | POA: Diagnosis not present

## 2023-08-24 NOTE — Telephone Encounter (Signed)
Patient's son is calling because the patient is currently at [facility]. Patient's son stated per the hospital she might of had a mild heart attack. Patient's son requested we call the patient in regards to this. Please advise.

## 2023-08-24 NOTE — Telephone Encounter (Signed)
Attempted to return call to son, but no answer. Left voicemail asking they call us back if needed, but hospital was likely taking care of things.

## 2023-08-25 ENCOUNTER — Ambulatory Visit: Payer: Medicare HMO | Admitting: Cardiovascular Disease

## 2023-08-25 DIAGNOSIS — I214 Non-ST elevation (NSTEMI) myocardial infarction: Secondary | ICD-10-CM | POA: Diagnosis not present

## 2023-08-25 DIAGNOSIS — Z955 Presence of coronary angioplasty implant and graft: Secondary | ICD-10-CM | POA: Diagnosis not present

## 2023-08-25 DIAGNOSIS — N1831 Chronic kidney disease, stage 3a: Secondary | ICD-10-CM | POA: Diagnosis not present

## 2023-08-25 DIAGNOSIS — I25110 Atherosclerotic heart disease of native coronary artery with unstable angina pectoris: Secondary | ICD-10-CM | POA: Diagnosis not present

## 2023-08-25 DIAGNOSIS — R079 Chest pain, unspecified: Secondary | ICD-10-CM | POA: Diagnosis not present

## 2023-08-25 DIAGNOSIS — I129 Hypertensive chronic kidney disease with stage 1 through stage 4 chronic kidney disease, or unspecified chronic kidney disease: Secondary | ICD-10-CM | POA: Diagnosis not present

## 2023-08-25 DIAGNOSIS — E782 Mixed hyperlipidemia: Secondary | ICD-10-CM | POA: Diagnosis not present

## 2023-08-25 DIAGNOSIS — A419 Sepsis, unspecified organism: Secondary | ICD-10-CM | POA: Diagnosis not present

## 2023-08-25 DIAGNOSIS — I2510 Atherosclerotic heart disease of native coronary artery without angina pectoris: Secondary | ICD-10-CM | POA: Diagnosis not present

## 2023-08-25 DIAGNOSIS — J189 Pneumonia, unspecified organism: Secondary | ICD-10-CM | POA: Diagnosis not present

## 2023-08-25 DIAGNOSIS — I498 Other specified cardiac arrhythmias: Secondary | ICD-10-CM | POA: Diagnosis not present

## 2023-08-26 DIAGNOSIS — I214 Non-ST elevation (NSTEMI) myocardial infarction: Secondary | ICD-10-CM | POA: Diagnosis not present

## 2023-08-27 ENCOUNTER — Other Ambulatory Visit: Payer: Self-pay | Admitting: Internal Medicine

## 2023-08-27 DIAGNOSIS — Z1231 Encounter for screening mammogram for malignant neoplasm of breast: Secondary | ICD-10-CM

## 2023-09-02 DIAGNOSIS — G720 Drug-induced myopathy: Secondary | ICD-10-CM | POA: Diagnosis not present

## 2023-09-02 DIAGNOSIS — E785 Hyperlipidemia, unspecified: Secondary | ICD-10-CM | POA: Diagnosis not present

## 2023-09-02 DIAGNOSIS — I214 Non-ST elevation (NSTEMI) myocardial infarction: Secondary | ICD-10-CM | POA: Diagnosis not present

## 2023-09-02 DIAGNOSIS — Z794 Long term (current) use of insulin: Secondary | ICD-10-CM | POA: Diagnosis not present

## 2023-09-02 DIAGNOSIS — I10 Essential (primary) hypertension: Secondary | ICD-10-CM | POA: Diagnosis not present

## 2023-09-02 DIAGNOSIS — J189 Pneumonia, unspecified organism: Secondary | ICD-10-CM | POA: Diagnosis not present

## 2023-09-02 DIAGNOSIS — E1129 Type 2 diabetes mellitus with other diabetic kidney complication: Secondary | ICD-10-CM | POA: Diagnosis not present

## 2023-09-02 DIAGNOSIS — I2510 Atherosclerotic heart disease of native coronary artery without angina pectoris: Secondary | ICD-10-CM | POA: Diagnosis not present

## 2023-09-03 DIAGNOSIS — E1129 Type 2 diabetes mellitus with other diabetic kidney complication: Secondary | ICD-10-CM | POA: Diagnosis not present

## 2023-09-07 ENCOUNTER — Telehealth: Payer: Self-pay

## 2023-09-07 NOTE — Progress Notes (Signed)
Received a call from [name] wanting to speak with [name], PharmD or [name].   Patient stated she has her part of the med application but now is needing her doctor's signature and stated she is almost out of her inhaler.   I have sent a message to [name], who will be giving [name] a call.  Melanie [name], Melanie [name], [facility] Care Management Assistant [phone]

## 2023-09-14 ENCOUNTER — Telehealth: Payer: Self-pay | Admitting: Pharmacy Technician

## 2023-09-14 DIAGNOSIS — Z5986 Financial insecurity: Secondary | ICD-10-CM

## 2023-09-14 NOTE — Progress Notes (Signed)
 Cardiology Office Note    Date:  09/15/2023  ID:  Galaxy, Borden 1946-06-03, MRN [id number] PCP:  Loreli Elsie JONETTA Mickey., MD  Cardiologist:  Aleene Passe, MD  Electrophysiologist:  None   Chief Complaint: Hospital follow up   History of Present Illness: .    Melanie [name] is a 77 y.o. female with visit-pertinent history of  CAD with recent NSTEMI on 08/24/23, hypertension, hyperlipidemia, paroxysmal atrial fibrillation not on anticoagulation., CKD stage III, type 2 diabetes mellitus, MDD.  Cardiac catheterization in 2002 showed normal coronary arteries, Myoview  in 09/2016 showed inferior infarct with mild peri-infarct ischemia, felt to overall be low risk with recommendation for medical management.  Last seen in cardiology clinic on 08/2022 by Orren Fabry, PA.  She remained stable from a cardiac perspective at that time.  On 08/24/2023 she presented to [facility] with shortness of breath, chest pain, overall feeling poor with chills.  She reported feeling a burning esophageal pain and dull chest pain.  CTA showed patchy infiltrate in right lung concerning for early pneumonia, small effusion associated.  Her initial ECG showed sinus rhythm with ST depression in 1 to aVL, elevation in aVR, nondiagnostic ST elevation in V1.  Patient was found to be septic with temperature of 102.1 Fahrenheit, tachycardic, tachypneic with elevated lactic acid that peaked at 3.26.  She was treated with IV antibiotics.  Echocardiogram on 08/24/2023 indicated LVEF of 50 to 55%, LV hypertrophy and LV wall motion with mild hypokinesis, mild MR.  Troponin peaked on 12/9 at 1288.  She underwent LHC on 12/9, that showed left main with minimal luminal irregularities, LAD with mid 40%, D1 90% stenosis, OM1 with 90% stenosis, distal RCA 100% stenosed, marginal 80% that was noted to be a large vessel with left-to-right collaterals.  Coronary angiogram showed culprit lesion in the OM 1 with 90% stenosis, she had a  2.25 x 18 mm Xience DES placed.  Today she presents for hospital follow-up.  She reports that she is feeling very well, she was able to cook dinner for her family on Christmas.  She denies any further chest discomfort.  She notes that she has some very minor shortness of breath related to her COPD as she recently ran out of her Stiolito, she notes her mild shortness of breath started after not being able to use her Stiolito, will have refilled tomorrow.  Patient reports that she plans to start cardiac rehab at [facility].  Labwork independently reviewed: 08/26/23: Sodium 137, potassium 3.5, creatinine 1.00, hemoglobin 13.9, hematocrit 41.6, magnesium  2.0  ROS: .   Today she denies chest pain, shortness of breath, lower extremity edema, fatigue, palpitations, melena, hematuria, hemoptysis, diaphoresis, weakness, presyncope, syncope, orthopnea, and PND.  All other systems are reviewed and otherwise negative. Studies Reviewed: SABRA    EKG:  EKG is ordered today, personally reviewed, demonstrating  EKG Interpretation Date/Time:  Tuesday September 15 2023 13:14:29 EST Ventricular Rate:  72 PR Interval:  158 QRS Duration:  104 QT Interval:  390 QTC Calculation: 427 R Axis:   14  Text Interpretation: Sinus rhythm with Premature atrial complexes No significant change since last tracing Confirmed by Rockwell Zentz [phone]) on 09/15/2023 1:25:54 PM    CV Studies:  LHC 08/24/23 with [facility]    Dist RCA lesion is 100% stenosed.    RV Branch lesion is 80% stenosed.    1st Diag lesion is 90% stenosed.    1st Mrg lesion is 90%  stenosed, reduced to 0%.   Procedures: Left Heart Catheterization, Coronary Angiography, Percutaneous  Coronary Intervention  Indication: NSTEMI  Access: R-radial  Catheters: JL3.5, JL4, EBU4.0 Guide, 6Fr Telescope   Procedure Note:  A 5/6 Fr radial sheath was inserted into the right radial artery via  micropuncture technique. IA Verapamil was administered. A 0.035  J-wire and  JL4.0 (JL3.5 too short) diagnostic catheter was advanced into the  ascending aorta and left main was engaged. Heparin bolus was given.  Coronary angiogram was performed. Next, the JL4.0 diagnostic catheter was  removed over the 0.035 J-wire and exchanged for a JR4 diagnostic catheter  and advanced into the ascending aorta and right main was engaged. Coronary  angiogram was performed. The JR4 diagnostic catheter was used to cross  into the AV to measure LVEDP. Pull back was performed from LV-Ao. At  completion of procedure, all catheters/wires were removed.   Coronary Angiography Findings:  --LM: Minimal luminal irregularities  --LAD: Mid 40%, D1 90%  --LCx: OM1 90%  --RI: Minimal luminal irregularities  --RCA: Distal 100%, marginal 80% (large vessel) with L-R collaterals   Left Heart Catheterization Findings:  --AV crossed: Yes , LVEDP: 21 mmHg  --LV Gram: No   Intervention: Coronary angiogram showed culprit lesion in the OM1.  Additional Heparin given and loaded with Brilinta . Inserted EBU4.0 guide  and wired across lesion with a Versaturn. Performed PTCA with a 2.59mm  compliant balloon.  Selected a 2.25x11mm Xience DES and deployed at above  nominal pressure. Post-dilated with stent balloon at high pressure  (~2.72mm). Repeat angiogram showed good result. ACT level at therapeutic  range during case. Pre-procedural stenosis was 90% with TIMI III flow, and  post-procedural stenosis was 0% with TIMI III flow. When removing all  equipment, the Versaturn wire appeared to be stuck and when removing the  wire from the guide, the tip of the wire was fracture. Repeat fluoroscopy  showed a single black radiopaque dot at the proximal stent edge and not  mobile. Suspect it got tangled within the stent. Performed fluoroscopy of  entire chest and right arm and there was no evidence of any remnant of  fractured wire.   Comments: DAPT for 12 months. LVEDP was elevated. Of note,  significantly  hypertensive during entire case.   Cardiac Studies & Procedures     STRESS TESTS  MYOCARDIAL PERFUSION IMAGING 10/16/2016  Narrative  Nuclear stress EF: 45%.  ST segment depression of 0.5 mm was noted during stress in the aVF, V5, V6, II and III leads.  Defect 1: There is a medium defect of severe severity present in the basal inferior location.  Defect 2: There is a small defect of moderate severity present in the apex location.  This is a low risk study.  Findings consistent with prior myocardial infarction with peri-infarct ischemia.  The left ventricular ejection fraction is mildly decreased (45-54%).  Low risk stress nuclear study with apical thinning and prior inferior basal infarct with mild peri-infarct ischemia; EF 45 with akinesis of the basal inferior wall; mild LVE.  ECHOCARDIOGRAM  ECHOCARDIOGRAM COMPLETE 11/11/2018  Narrative ECHOCARDIOGRAM REPORT    Patient Name:   JNIYAH DANTUONO Date of Exam: 11/11/2018 Medical Rec #:  [id number]         Height:       67.0 in Accession #:    [id number]        Weight:       200.8 lb Date of Birth:  March 09, 1946  BSA:          2.03 m Patient Age:    72 years          BP:           142/66 mmHg Patient Gender: F                 HR:           71 bpm. Exam Location:  [address]   Procedure: 2D Echo, Cardiac Doppler and Color Doppler  Indications:    R55 Syncope  History:        Patient has prior history of Echocardiogram examinations, most recent 01/05/2018. Risk Factors: Diabetes and Hypertension. Paroxysmal atrial fibrillation. Bradycardia.  Sonographer:    Carl Coma RDCS Referring Phys: [id number] ALEENE BHAGAT  IMPRESSIONS   1. The left ventricle has low normal systolic function, with an ejection fraction of 50-55%. The cavity size was normal. There is mildly increased left ventricular wall thickness. Left ventricular diastolic Doppler parameters are consistent with impaired  relaxation Indeterminent filling pressures The E/e' is 8-15. No evidence of left ventricular regional wall motion abnormalities. 2. No evidence of left ventricular regional wall motion abnormalities. 3. The right ventricle has normal systolic function. The cavity was normal. There is no increase in right ventricular wall thickness. 4. The mitral valve is degenerative. Mild calcification of the mitral valve leaflet. There is mild mitral annular calcification present. No evidence of mitral valve stenosis. 5. The tricuspid valve is normal in structure. 6. The aortic valve is tricuspid Mild sclerosis of the aortic valve. 7. When compared to the prior study: Compared to a prior study in 12/2017, there are no significant changes.  SUMMARY  LVEF 50-55%, mild LVH, normal wall motion, grade 1 DD, indeterminate LV filling pressure, normal biatrial size, MAC with trivial MR, aortic valve sclerosis, trivial TR, normal IVC FINDINGS Left Ventricle: The left ventricle has low normal systolic function, with an ejection fraction of 50-55%. The cavity size was normal. There is mildly increased left ventricular wall thickness. Left ventricular diastolic Doppler parameters are consistent with impaired relaxation Indeterminent filling pressures The E/e' is 8-15. No evidence of left ventricular regional wall motion abnormalities.. Right Ventricle: The right ventricle has normal systolic function. The cavity was normal. There is no increase in right ventricular wall thickness. Left [facility]: left atrial size was normal in size Right [facility]: right atrial size was normal in size. Interatrial Septum: No atrial level shunt detected by color flow Doppler. Pericardium: There is no evidence of pericardial effusion. Mitral Valve: The mitral valve is degenerative in appearance. Mild calcification of the mitral valve leaflet. There is mild mitral annular calcification present. Mitral valve regurgitation is trivial by color flow  Doppler. No evidence of mitral valve stenosis. Tricuspid Valve: The tricuspid valve is normal in structure. Tricuspid valve regurgitation is trivial by color flow Doppler. Aortic Valve: The aortic valve is tricuspid Mild sclerosis of the aortic valve. Aortic valve regurgitation was not visualized by color flow Doppler. There is no evidence of aortic valve stenosis. Pulmonic Valve: The pulmonic valve was grossly normal. Pulmonic valve regurgitation is trivial by color flow Doppler. Venous: The inferior vena cava is normal in size with greater than 50% respiratory variability. Compared to previous exam: Compared to a prior study in 12/2017, there are no significant changes.  LEFT VENTRICLE PLAX 2D (Teich) LV EF:          67.4 %   Diastology LVIDd:  4.80 cm  LV e' lateral:   8.05 cm/s LVIDs:          3.00 cm  LV E/e' lateral: 8.8 LV PW:          1.30 cm  LV e' medial:    4.79 cm/s LV IVS:         1.30 cm  LV E/e' medial:  14.7 LVOT diam:      2.10 cm LV SV:          73 ml LVOT Area:      3.46 cm  RIGHT VENTRICLE RV Basal diam:  2.90 cm RV S prime:     16.00 cm/s TAPSE (M-mode): 2.5 cm  LEFT ATRIUM             Index       RIGHT ATRIUM           Index LA diam:        4.00 cm 1.97 cm/m  RA Area:     11.80 cm LA Vol (A2C):   70.5 ml 34.80 ml/m RA Volume:   27.90 ml  13.77 ml/m LA Vol (A4C):   41.3 ml 20.39 ml/m LA Biplane Vol: 55.8 ml 27.55 ml/m AORTIC VALVE LVOT Vmax:   92.95 cm/s LVOT Vmean:  63.750 cm/s LVOT VTI:    0.175 m  AORTA Ao Root diam: 3.40 cm Ao Asc diam:  3.60 cm  MITRAL VALVE MV Area (PHT): MV PHT: MV Decel Time: 313 msec MV E velocity: 70.60 cm/s MV A velocity: 109.00 cm/s MV E/A ratio:  0.65   Vinie Maxcy MD Electronically signed by Vinie Maxcy MD Signature Date/Time: 11/11/2018/1:57:08 PM    Final   MONITORS  CARDIAC EVENT MONITOR 11/11/2018  Narrative  NSR  1 episode of a 5 beat run of nonsustained ventricular tachycardia             Current Reported Medications:.    Current Meds  Medication Sig   albuterol  (VENTOLIN  HFA) 108 (90 Base) MCG/ACT inhaler 1 puff as needed   amLODipine  (NORVASC ) 10 MG tablet Take 10 mg by mouth daily.   aspirin  EC 81 MG tablet Take 81 mg by mouth daily.    bisoprolol  (ZEBETA ) 5 MG tablet TAKE ONE TABLET BY MOUTH ONCE DAILY   Cyanocobalamin  (VITAMIN B-12 IJ) Inject 1,000 Units as directed every 30 (thirty) days.   DULoxetine (CYMBALTA) 60 MG capsule Take 60 mg by mouth daily.   Evolocumab  (REPATHA  SURECLICK) 140 MG/ML SOAJ Inject 140 mg into the skin every 14 (fourteen) days.   gabapentin  (NEURONTIN ) 300 MG capsule TAKE THREE CAPSULES BY MOUTH EVERY MORNING and TAKE THREE CAPSULES BY MOUTH AT NOON and TAKE THREE CAPSULES BY MOUTH EVERYDAY AT BEDTIME   insulin  degludec (TRESIBA) 100 UNIT/ML FlexTouch Pen Inject 72 Units into the skin in the morning.   lisinopril  (ZESTRIL ) 40 MG tablet Take 20 mg by mouth daily.   metFORMIN  (GLUCOPHAGE -XR) 500 MG 24 hr tablet Take 500 mg by mouth every morning.   nitroGLYCERIN  (NITROSTAT ) 0.4 MG SL tablet Place 1 tablet (0.4 mg total) under the tongue every 5 (five) minutes as needed.   nortriptyline  (PAMELOR ) 50 MG capsule Take 1 capsule (50 mg total) by mouth at bedtime.   omeprazole (PRILOSEC) 40 MG capsule Take 40 mg by mouth every morning.   Semaglutide,0.25 or 0.5MG /DOS, (OZEMPIC, 0.25 OR 0.5 MG/DOSE,) 2 MG/3ML SOPN Inject 0.5 mg into the skin once a week.   ticagrelor  (BRILINTA ) 90 MG TABS  tablet Take 1 tablet (90 mg total) by mouth 2 (two) times daily.   Tiotropium Bromide-Olodaterol (STIOLTO RESPIMAT ) 2.5-2.5 MCG/ACT AERS Inhale 2 puffs into the lungs daily.    Physical Exam:    VS:  BP 118/66   Pulse 72   Ht 5' 7 (1.702 m)   Wt 182 lb (82.6 kg)   SpO2 96%   BMI 28.51 kg/m    Wt Readings from Last 3 Encounters:  09/15/23 182 lb (82.6 kg)  07/22/23 184 lb (83.5 kg)  07/01/23 183 lb (83 kg)    GEN: Well nourished, well developed in  no acute distress NECK: No JVD; No carotid bruits CARDIAC: RRR, no murmurs, rubs, gallops, right radial cath site clean and intact without evidence of hematoma RESPIRATORY:  Clear to auscultation without rales, wheezing or rhonchi  ABDOMEN: Soft, non-tender, non-distended EXTREMITIES:  No edema; No acute deformity   Asessement and Plan:.    CAD: s/p NSTEMI on 08/24/23, underwent LHC on 12/9, that showed left main with minimal luminal irregularities, LAD with mid 40%, D1 90% stenosis, OM1 with 90% stenosis, distal RCA 100% stenosed, marginal 80% that was noted to be a large vessel with left-to-right collaterals.  Coronary angiogram showed culprit lesion in the OM 1 with 90% stenosis, she had a 2.25 x 18 mm Xience DES placed. Today she reports that she is doing very well.  She denies any further anginal symptoms.  She notes some minor shortness of breath related to her COPD, she has not been using her stilido, notes breathing worsened after stopping. She will have refilled tomorrow. She will monitor and notify if no improvement. She plans [facility] given [facility] location, she can start cardiac rehab. Right radial cath site is clean, dry and intact without evidence of hematoma.  Will send refill of her Brilinta , will also reach out to Dr. Alveta to see if this can be switched to Plavix  as patient notes that Brilinta  may be too expensive, she has new insurance going into effect tomorrow and will let us  know if needed.  Reviewed ED precautions. Check BMET today. Continue aspirin  81 mg daily, Norvasc  10 mg daily, bisoprolol  5 mg daily, Repatha , lisinopril  20 mg daily, nitroglycerin  as needed for chest discomfort.  Hyperlipidemia: Last lipid profile on 08/24/2023 indicated total cholesterol 67, triglycerides 74, HDL 36 and LDL 16. Continue Repatha .   Hypertension: Blood pressure today 118/66.  Continue current antihypertensive regimen.  Tobacco use: Patient reports that she is  currently smoking half a pack a day, is trying to reduce. Cessation encouraged.   Paroxysmal atrial fibrillation: Patient with history of PAF, no recent episodes of PAF.  Previously noted that if she has repeat episodes will need to be started on a DOAC. Denies any recent palpitations.  Today she denies any palpitations or feeling of increased heart rate.  EKG today indicates sinus rhythm with occasional PACs.  Type 2 diabetes mellitus: Last hemoglobin A1c 6.3 on 08/23/2023.  Monitored and managed per PCP.   Disposition: F/u with Dr. Alveta in two months.   Signed, Vietta Bonifield D Rustin Erhart, NP

## 2023-09-14 NOTE — Progress Notes (Signed)
Pharmacy Medication Assistance Program Note    09/14/2023  Patient ID: Melanie [name], female   DOB: [birth year], 77 y.o.   MRN: [id number]     08/06/2023 09/14/2023  Outreach Medication One  Manufacturer Medication One [employer]   [employer] Drugs Stiolto   Type of Radiographer, therapeutic Assistance   Date Application Received From Patient 07/22/2023   Application Items Received From Patient Application;Proof of Income;Other   Date Application Received From Provider 07/17/2023   Date Application Submitted to Manufacturer 08/05/2023   Method Application Sent to Manufacturer Fax   Patient Assistance Determination  Approved  Approval Start Date  09/16/2023  Approval End Date  09/14/2024   Care coordination call placed to BI in regard to [facility] application. Spoke to Stanton Kidney who informs patient is APPROVED 09/16/23-09/14/24. She informs that patient is set up for auto fill and medication should auto fill and ship to patient's address. Patient may also call BI at any time to check on next shipment by calling [phone].       08/20/2023 09/14/2023  Outreach Medication Two  Initial Outreach Date (Medication Two) 07/08/2023   Manufacturer Medication Two Novo Nordisk   Nordisk Drugs Evaristo Bury   Dose of Ozempic 2mg /87ml   Dose of Tresiba 100units/ml   Type of Radiographer, therapeutic Assistance   Date Application Sent to Patient 07/08/2023   Application Items Requested Application;Proof of Income;Other   Date Application Sent to Prescriber 07/08/2023   Name of Prescriber Martha Clan   Date Application Received From Patient 07/22/2023   Application Items Received From Patient Application;Proof of Income;Other   Date Application Received From Provider 08/17/2023   Method Application Sent to Manufacturer Fax   Date Application Submitted to Manufacturer 08/19/2023   Patient Assistance Determination  Approved  Approval Start Date  09/16/2023   Care coordination call placed  to [employer] in regard to [place] application. Per the IVR system, patient is APPROVED 09/16/23-09/10/24. Medications will auto fill and auto ship to prescriber's office based on last fill date in 2025. Patient may call Novo Nordisk at any time to check on next refill and ship dates by calling [phone].  Signature  Kristopher Glee [facility]  Office: [phone] Fax: [phone] Email: [email or web address]

## 2023-09-15 ENCOUNTER — Encounter: Payer: Self-pay | Admitting: Cardiology

## 2023-09-15 ENCOUNTER — Ambulatory Visit: Payer: Medicare HMO | Attending: Cardiology | Admitting: Cardiology

## 2023-09-15 VITALS — BP 118/66 | HR 72 | Ht 67.0 in | Wt 182.0 lb

## 2023-09-15 DIAGNOSIS — Z720 Tobacco use: Secondary | ICD-10-CM | POA: Diagnosis not present

## 2023-09-15 DIAGNOSIS — I480 Paroxysmal atrial fibrillation: Secondary | ICD-10-CM

## 2023-09-15 DIAGNOSIS — Z794 Long term (current) use of insulin: Secondary | ICD-10-CM | POA: Diagnosis not present

## 2023-09-15 DIAGNOSIS — E1169 Type 2 diabetes mellitus with other specified complication: Secondary | ICD-10-CM

## 2023-09-15 DIAGNOSIS — I2510 Atherosclerotic heart disease of native coronary artery without angina pectoris: Secondary | ICD-10-CM | POA: Diagnosis not present

## 2023-09-15 DIAGNOSIS — I10 Essential (primary) hypertension: Secondary | ICD-10-CM | POA: Diagnosis not present

## 2023-09-15 DIAGNOSIS — E785 Hyperlipidemia, unspecified: Secondary | ICD-10-CM | POA: Diagnosis not present

## 2023-09-15 MED ORDER — TICAGRELOR 90 MG PO TABS: 90.0000 mg | ORAL_TABLET | Freq: Two times a day (BID) | ORAL | 3 refills | Status: DC

## 2023-09-15 NOTE — Patient Instructions (Addendum)
 Medication Instructions:  Start Brilinta  90 mg twice a day *If you need a refill on your cardiac medications before your next appointment, please call your pharmacy*  Lab Work: Today we will draw BMP If you have labs (blood work) drawn today and your tests are completely normal, you will receive your results only by: MyChart Message (if you have MyChart) OR A paper copy in the mail If you have any lab test that is abnormal or we need to change your treatment, we will call you to review the results.  Testing/Procedures: No testing  Follow-Up: At [facility], you and your health needs are our priority.  As part of our continuing mission to provide you with exceptional heart care, we have created designated Provider Care Teams.  These Care Teams include your primary Cardiologist (physician) and Advanced Practice Providers (APPs -  Physician Assistants and Nurse Practitioners) who all work together to provide you with the care you need, when you need it.  We recommend signing up for the patient portal called MyChart.  Sign up information is provided on this After Visit Summary.  MyChart is used to connect with patients for Virtual Visits (Telemedicine).  Patients are able to view lab/test results, encounter notes, upcoming appointments, etc.  Non-urgent messages can be sent to your provider as well.   To learn more about what you can do with MyChart, go to [email or web address].    Your next appointment:   2 month(s)  Provider:   Aleene Passe, MD

## 2023-09-16 LAB — BASIC METABOLIC PANEL
BUN/Creatinine Ratio: 13 (ref 12–28)
BUN: 16 mg/dL (ref 8–27)
CO2: 21 mmol/L (ref 20–29)
Calcium: 9.7 mg/dL (ref 8.7–10.3)
Chloride: 104 mmol/L (ref 96–106)
Creatinine, Ser: 1.2 mg/dL — ABNORMAL HIGH (ref 0.57–1.00)
Glucose: 85 mg/dL (ref 70–99)
Potassium: 4.8 mmol/L (ref 3.5–5.2)
Sodium: 142 mmol/L (ref 134–144)
eGFR: 47 mL/min/{1.73_m2} — ABNORMAL LOW (ref >59)

## 2023-09-17 ENCOUNTER — Telehealth: Payer: Self-pay

## 2023-09-17 ENCOUNTER — Telehealth: Payer: Self-pay | Admitting: Cardiovascular Disease

## 2023-09-17 NOTE — Telephone Encounter (Signed)
-----   Message from Katlyn D West sent at 09/16/2023  7:31 PM EST ----- Please let Melanie [name] know that her labs show her kidney function is slightly reduced, close to prior readings. Please remind her to stay well hydrated with water. Her electrolytes are normal. Continue current medications and follow up as planned.

## 2023-09-17 NOTE — Telephone Encounter (Signed)
 Called patient advised of below they verbalized understanding.

## 2023-09-17 NOTE — Telephone Encounter (Signed)
 Pt c/o medication issue:  1. Name of Medication:  Brilinta   2. How are you currently taking this medication (dosage and times per day)?   3. Are you having a reaction (difficulty breathing--STAT)?   4. What is your medication issue?   Patient states Brilinta  will cost her $396, and she would like to be put on Plavix  instead if possible.

## 2023-09-18 MED ORDER — PANTOPRAZOLE SODIUM 40 MG PO TBEC: 40.0000 mg | DELAYED_RELEASE_TABLET | Freq: Every day | ORAL | 3 refills | Status: DC

## 2023-09-18 MED ORDER — CLOPIDOGREL BISULFATE 75 MG PO TABS: ORAL_TABLET | ORAL | 3 refills | Status: DC

## 2023-09-18 NOTE — Telephone Encounter (Signed)
 West, Katlyn D, NP  You37 minutes ago (11:08 AM)   Melanie [name],  She and I had discussed this at her appointment. Dr. Alveta has approved her switching to Plavix . Please have her take her last dose of nighttime Brilinta . The next morning (after last dose of Brilinta ) she will need to take 300 mg Plavix  then Plavix  75 mg daily thereafter.  Thanks, Katlyn West, NP   Via secure chat with katlyn, okay to change omeprazole to protonix  40mg  daily. Both medications have been sent to pharmacy. Called and spoke with patient in detail. Verbalized understanding.

## 2023-09-27 NOTE — Progress Notes (Signed)
 Cardiology Office Note    Patient Name: Melanie [name] Date of Encounter: 09/28/2023  Primary Care Provider:  Loreli Elsie JONETTA Mickey., MD Primary Cardiologist:  Aleene Passe, MD Primary Electrophysiologist: None   Past Medical History    Past Medical History:  Diagnosis Date   Arthritis    Bradycardia    Depression    under control   DM (diabetes mellitus) (HCC)    type 2   Dyspnea    with some exertion   Family history of anesthesia complication    mother-nausea and vomiting   Family history of breast cancer    Family history of ovarian cancer    GERD (gastroesophageal reflux disease)    occasional   Headache    HTN (hypertension)    Neuropathy    legs and feet   Neuropathy    feet   PAF (paroxysmal atrial fibrillation) (HCC) 10 years ago   no problems since, due to stress   Situational stress    was taking care of sick husband    History of Present Illness  Melanie [name] is a 78 y.o. female with a PMH of CAD s/p NSTEMI treated with PCI/DES to OM1 on 08/2023, HTN, bradycardia, HLD former tobacco abuse, paroxysmal AF, DM type II, breast CA s/p right partial mastectomy 10/2017, carotid artery disease, right subclavian stenosis, CKD stage III who presents today for follow-up visit.  Melanie [name] has been followed by Dr. Passe since 2013 for the management of CAD and AF.  She completed previous ischemic evaluation in 2002 with normal coronaries and has a remote history of paroxysmal AF managed with metoprolol  and Cardizem  with no recurrence.  She is currently not on anticoagulation for management and will need to start DOAC if recurrence occurs.  Melanie [name] presented to the hospital at [facility] on 08/24/2023 with complaint of shortness of breath and chest pain.  CTA which showed patchy infiltrates in the right lung concerning for pneumonia and small fusion. EKG was completed showing ST depression opponents were peaked at 1288. She was diagnosed with NSTEMI in the  setting of sepsis from PNA. She underwent LHC on 12/9 that showed 4% mid LAD, D1 90% stenosis, OM1 with 90% stenosis and distal RCA and 100% with left-to-right collaterals. The culprit lesion was OM1 that was treated with PCI/DES x 1 and patient was discharged with Brilinta  and ASA which was later switched to Plavix  due to cost.  She will be starting cardiac rehab at [facility].  She was seen for post PCI follow-up on 09/15/2023 by Katlyn West, NP and patient reported no angina but had some minor shortness of breath in the setting of COPD.  She is currently on Repatha  due to statin intolerance and blood pressure was stable with advisement to reduce tobacco abuse.  She presents with concerns about a possible retained wire fragment from the procedure. The patient reports that the performing doctor had difficulty removing the wire during the procedure and suspected that a fragment may have been left behind. However, subsequent x-rays did not show any retained fragments. The patient is anxious about the possibility of a retained fragment and its potential implications. In addition to her cardiac concerns, the patient also reports respiratory symptoms. She has a history of COPD and is currently experiencing coughing and shortness of breath. She reports coughing up light brown sputum and occasional night sweats. She is currently on a medication regimen for her COPD, which she takes regularly.  She  is also interested in cardiac rehab and will be starting at [facility] [place] in the next few weeks.  Today's visit her blood pressure was stable 20/68 and she reported no adverse reactions to her current medication regimen.   Patient denies chest pain, palpitations, dyspnea, PND, orthopnea, nausea, vomiting, dizziness, syncope, edema, weight gain, or early satiety.   Review of Systems  Please see the history of present illness.    All other systems reviewed and are otherwise negative except as noted  above.  Physical Exam    Wt Readings from Last 3 Encounters:  09/28/23 181 lb (82.1 kg)  09/15/23 182 lb (82.6 kg)  07/22/23 184 lb (83.5 kg)   VS: Vitals:   09/28/23 1312  BP: 128/68  Pulse: 88  SpO2: (!) 88%  ,Body mass index is 28.35 kg/m. GEN: Well nourished, well developed in no acute distress Neck: No JVD; No carotid bruits Pulmonary: Clear to auscultation with consolidation in the right lobe and cough with productive brown sputum  cardiovascular: Normal rate. Regular rhythm. Normal S1. Normal S2.   Murmurs: There is no murmur.  ABDOMEN: Soft, non-tender, non-distended EXTREMITIES:  No edema; No deformity   EKG/LABS/ Recent Cardiac Studies   ECG personally reviewed by me today -none completed today  Risk Assessment/Calculations:      CHA2DS2-VASc Score = 6   This indicates a 9.7% annual risk of stroke. The patient's score is based upon: CHF History: 0 HTN History: 1 Diabetes History: 1 Stroke History: 0 Vascular Disease History: 1 Age Score: 2 Gender Score: 1          Lab Results  Component Value Date   WBC 8.1 09/17/2022   HGB 13.4 09/17/2022   HCT 41.1 09/17/2022   MCV 91.3 09/17/2022   PLT 293 09/17/2022   Lab Results  Component Value Date   CREATININE 1.20 (H) 09/15/2023   BUN 16 09/15/2023   NA 142 09/15/2023   K 4.8 09/15/2023   CL 104 09/15/2023   CO2 21 09/15/2023   Lab Results  Component Value Date   CHOL 94 (L) 08/19/2019   HDL 35 (L) 08/19/2019   LDLCALC 25 08/19/2019   TRIG 222 (H) 08/19/2019   CHOLHDL 2.7 08/19/2019    Lab Results  Component Value Date   HGBA1C 6.5 (H) 11/27/2016   Assessment & Plan    1.  Coronary artery disease: -s/p NSTEMI treated with PCI/DES x 1 to OM1 with residual disease in D1, 90%, 100% RCA with left-to-right collaterals, 40% mid LAD -She is currently undergoing cardiac rehab at [facility] -Today patient reports no chest pain or angina equivalent since her PCI and previous follow-up. -She  she will discontinue Brilinta  and start Plavix  4 g starting tomorrow evening. -She is cleared to proceed with cardiac rehab at [facility]. -Continue current GDMT with Plavix  75 mg, ASA 81 mg Nitrostat  0.4 mg as needed and Repatha  140 mg q. 14 days.  2.  Essential hypertension: -Patient's blood pressure today was controlled at 128/68 -Continue Norvasc  20 mg, bisoprolol  5 mg, Norvasc  10 mg  3.  Hyperlipidemia: -Patient's last LDL cholesterol was 83 -Continue Repatha   4.  Tobacco abuse: -Smoking cessation was encouraged  5.  Paroxysmal AF: -Patient is rate controlled at 88 BPM with no recent episodes -She is aware that NOAC will be initiated if AF returns. -Advised to avoid triggers such as EtOH and caffeine  Disposition: Follow-up with Aleene Passe, MD as scheduled    Signed, Wyn  Mickey Jackee Shove, NP 09/28/2023, 2:52 PM [facility]

## 2023-09-28 ENCOUNTER — Ambulatory Visit: Payer: Medicare Other | Attending: Nurse Practitioner | Admitting: Nurse Practitioner

## 2023-09-28 ENCOUNTER — Other Ambulatory Visit: Payer: Self-pay

## 2023-09-28 ENCOUNTER — Encounter: Payer: Self-pay | Admitting: Nurse Practitioner

## 2023-09-28 ENCOUNTER — Ambulatory Visit: Payer: Medicare Other

## 2023-09-28 VITALS — BP 128/68 | HR 88 | Ht 67.0 in | Wt 181.0 lb

## 2023-09-28 DIAGNOSIS — I480 Paroxysmal atrial fibrillation: Secondary | ICD-10-CM

## 2023-09-28 DIAGNOSIS — Z720 Tobacco use: Secondary | ICD-10-CM

## 2023-09-28 DIAGNOSIS — I10 Essential (primary) hypertension: Secondary | ICD-10-CM | POA: Diagnosis not present

## 2023-09-28 DIAGNOSIS — E785 Hyperlipidemia, unspecified: Secondary | ICD-10-CM

## 2023-09-28 DIAGNOSIS — I2510 Atherosclerotic heart disease of native coronary artery without angina pectoris: Secondary | ICD-10-CM | POA: Diagnosis not present

## 2023-09-28 MED ORDER — BISOPROLOL FUMARATE 5 MG PO TABS: ORAL_TABLET | ORAL | 3 refills | Status: DC

## 2023-09-28 NOTE — Patient Instructions (Addendum)
 Medication Instructions:  Your physician has recommended you make the following change in your medication:  Tomorrow evening please take 300 mg (4 tablets) of plavix . After tomorrow evening decrease back to 75 mg (1 tablet).  Lab Work: None ordered.  If you have labs (blood work) drawn today and your tests are completely normal, you will receive your results only by: MyChart Message (if you have MyChart) OR A paper copy in the mail If you have any lab test that is abnormal or we need to change your treatment, we will call you to review the results.  Testing/Procedures: None ordered.  Follow-Up: At [facility], you and your health needs are our priority.  As part of our continuing mission to provide you with exceptional heart care, we have created designated Provider Care Teams.  These Care Teams include your primary Cardiologist (physician) and Advanced Practice Providers (APPs -  Physician Assistants and Nurse Practitioners) who all work together to provide you with the care you need, when you need it.  We recommend signing up for the patient portal called MyChart.  Sign up information is provided on this After Visit Summary.  MyChart is used to connect with patients for Virtual Visits (Telemedicine).  Patients are able to view lab/test results, encounter notes, upcoming appointments, etc.  Non-urgent messages can be sent to your provider as well.   To learn more about what you can do with MyChart, go to [email or web address].    Your next appointment:   December 11, 2023  The format for your next appointment:   In Person  Provider:   Manus Passe, MD  Important Information About Sugar

## 2023-10-08 ENCOUNTER — Ambulatory Visit
Admission: RE | Admit: 2023-10-08 | Discharge: 2023-10-08 | Disposition: A | Discharge status: Home / Self Care | Payer: Medicare Other | Source: Ambulatory Visit | Attending: Internal Medicine | Admitting: Internal Medicine

## 2023-10-08 DIAGNOSIS — Z1231 Encounter for screening mammogram for malignant neoplasm of breast: Secondary | ICD-10-CM

## 2023-10-22 ENCOUNTER — Telehealth: Payer: Self-pay | Admitting: Pharmacy Technician

## 2023-10-22 DIAGNOSIS — Z5986 Financial insecurity: Secondary | ICD-10-CM

## 2023-10-22 NOTE — Progress Notes (Signed)
   10/22/2023  Patient ID: Melanie [name], female   DOB: [birth year], 78 y.o.   MRN: [id number]  Received message from Shereen Gin, Care Management Assistant that patient had not received her patient assistance program medications yet.  Care coordination call placed to [employer] in regard to Tresiba and Ozempic delivery. Spoke to Jaysa who informs [employer] is having shipping delays. She informs she could provide a voucher so the patient could use it at the local pharmacy to obtain a 30 day supply of Tresiba and a 30 day supply of Ozempic. Inquired if patient had called in for that already because she was advised to do so and Jaysa said she had not.  She provided the following voucher information: For Carlon ALLY: [id number] PCN: [id number] GRP: [id number]  ID: [id number]  For Pen Needles BIN: [id number] PCN: CNRX GRP: [id number] ID: [id number]  For Ozempic BIN: [id number] PCN: CNRX GRP: [id number] ID: [id number]  Successful outreach to patient. HIPAA verified. Informed her that [employer] had issued her vouchers to get one free 30 days supply of each of the products listed above until her shipment could arrive at provider's office. Patient informs she will call me next week to get this information as she has enough for several more days.  Will await a return call.  Annya Lizana, CPhT [place]  Office: [phone] Fax: [phone] Email: [email or web address]

## 2023-11-11 ENCOUNTER — Other Ambulatory Visit: Payer: Self-pay | Admitting: Cardiovascular Disease

## 2023-11-11 ENCOUNTER — Telehealth: Payer: Self-pay | Admitting: Cardiovascular Disease

## 2023-11-11 DIAGNOSIS — I2510 Atherosclerotic heart disease of native coronary artery without angina pectoris: Secondary | ICD-10-CM

## 2023-11-11 DIAGNOSIS — E785 Hyperlipidemia, unspecified: Secondary | ICD-10-CM

## 2023-11-11 MED ORDER — REPATHA SURECLICK 140 MG/ML ~~LOC~~ SOAJ: 140.0000 mg | SUBCUTANEOUS | 0 refills | Status: DC

## 2023-11-11 NOTE — Telephone Encounter (Signed)
*  STAT* If patient is at the pharmacy, call can be transferred to refill team.   1. Which medications need to be refilled? (please list name of each medication and dose if known)   Evolocumab (REPATHA SURECLICK) 140 MG/ML SOAJ   2. Would you like to learn more about the convenience, safety, & potential cost savings by using the [facility]?   3. Are you open to using the [facility] (Type [facility]. ).  4. Which pharmacy/location (including street and city if local pharmacy) is medication to be sent to?  [id number] - [place], [place] - [address] AT [facility] [address]   5. Do they need a 30 day or 90 day supply?   90 day  Patient stated she is completely out of this medication.

## 2023-11-17 ENCOUNTER — Other Ambulatory Visit (HOSPITAL_COMMUNITY): Payer: Self-pay

## 2023-11-17 ENCOUNTER — Telehealth: Payer: Self-pay | Admitting: Pharmacy Technician

## 2023-11-17 NOTE — Telephone Encounter (Signed)
 Pharmacy Patient Advocate Encounter   Received notification from  staff messages  that prior authorization for repatha is required/requested.   Insurance verification completed.   The patient is insured through [employer] .   Per test claim: PA required; PA submitted to above mentioned insurance via CoverMyMeds Key/confirmation #/[id number] Status is pending   Pt has new ins. Pt ldl is low bc she has been on repatha since 09/2022

## 2023-11-18 NOTE — Telephone Encounter (Signed)
 Pharmacy Patient Advocate Encounter  Received notification from [facility] that Prior Authorization for repatha has been APPROVED from 11/17/23 to 05/19/24. Unable to obtain price due to refill too soon rejection, last fill date 11/11/23 next available fill date3/19/25   PA #/Case ID/Reference #: [id number]

## 2023-12-07 ENCOUNTER — Encounter: Payer: Self-pay | Admitting: Cardiovascular Disease
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
## 2023-12-11 ENCOUNTER — Ambulatory Visit: Payer: Medicare HMO | Admitting: Cardiovascular Disease

## 2023-12-11 ENCOUNTER — Ambulatory Visit: Payer: Medicare Other | Attending: Cardiovascular Disease | Admitting: Cardiovascular Disease

## 2023-12-11 ENCOUNTER — Encounter: Payer: Self-pay | Admitting: Cardiovascular Disease

## 2023-12-11 VITALS — BP 128/60 | HR 80 | Ht 67.0 in | Wt 188.4 lb

## 2023-12-11 DIAGNOSIS — I2510 Atherosclerotic heart disease of native coronary artery without angina pectoris: Secondary | ICD-10-CM | POA: Diagnosis not present

## 2023-12-11 DIAGNOSIS — E785 Hyperlipidemia, unspecified: Secondary | ICD-10-CM

## 2023-12-11 NOTE — Patient Instructions (Signed)
 Follow-Up: At [facility], you and your health needs are our priority.  As part of our continuing mission to provide you with exceptional heart care, our providers are all part of one team.  This team includes your primary Cardiologist (physician) and Advanced Practice Providers or APPs (Physician Assistants and Nurse Practitioners) who all work together to provide you with the care you need, when you need it.  Your next appointment:   6 month(s)  Provider:   Kristeen Miss, MD       1st Floor: - Lobby - Registration  - Pharmacy  - Lab - Cafe  2nd Floor: - PV Lab - Diagnostic Testing (echo, CT, nuclear med)  3rd Floor: - Vacant  4th Floor: - TCTS (cardiothoracic surgery) - AFib Clinic - Structural Heart Clinic - Vascular Surgery  - Vascular Ultrasound  5th Floor: - HeartCare Cardiology (general and EP) - Clinical Pharmacy for coumadin, hypertension, lipid, weight-loss medications, and med management appointments    Valet parking services will be available as well.     1st Floor: - Lobby - Registration  - Pharmacy  - Lab - Cafe  2nd Floor: - PV Lab - Diagnostic Testing (echo, CT, nuclear med)  3rd Floor: - Vacant  4th Floor: - TCTS (cardiothoracic surgery) - AFib Clinic - Structural Heart Clinic - Vascular Surgery  - Vascular Ultrasound  5th Floor: - HeartCare Cardiology (general and EP) - Clinical Pharmacy for coumadin, hypertension, lipid, weight-loss medications, and med management appointments    Valet parking services will be available as well.

## 2023-12-21 ENCOUNTER — Other Ambulatory Visit: Payer: Self-pay | Admitting: Neurology

## 2024-03-08 ENCOUNTER — Other Ambulatory Visit: Payer: Self-pay | Admitting: Pharmacist

## 2024-03-08 DIAGNOSIS — E785 Hyperlipidemia, unspecified: Secondary | ICD-10-CM

## 2024-03-08 DIAGNOSIS — I2510 Atherosclerotic heart disease of native coronary artery without angina pectoris: Secondary | ICD-10-CM

## 2024-03-08 MED ORDER — REPATHA SURECLICK 140 MG/ML ~~LOC~~ SOAJ: 140.0000 mg | SUBCUTANEOUS | 3 refills | Status: DC

## 2024-05-12 ENCOUNTER — Telehealth: Payer: Self-pay | Admitting: Pharmacy Technician

## 2024-05-12 NOTE — Progress Notes (Addendum)
     05/12/2024  Patient ID: Melanie [name], female   DOB: 1946-04-06, 78 y.o.   MRN: [id number]  Returned patient's call in regard to voicemail she left. Patient informs she went to pharmacy to pick up Repatha  and the cost was over $100. She was informed by pharmacy staff that the grant funding had ended. She informs the paperwork she has at home states her funding doesn't end until 05/19/2024.  Reached out to embedded [facility] Pharmacist Norman Pray for his assistance. He informs he will look into it. Called patient back and advised pharmacist will be working on this for her today. She verbalized understanding.  ADDENDUM 05/12/2024  Received message from Norman Pray, [facility] embedded Pharmacist,  that the copay issue at the pharmacy has been rectified. Successful outreach to patient, HIPAA verified. Patient was informed the grant issue at the pharmacy has been resolved. Informed patient that Tthe pharmacist  was able to get the cost to $0 for her and the medication would be ready for pickup later today. Patient verbalized understanding and appreciated the return call.  Melanie [name], CPhT [facility] Office: [phone] Email: [email or web address]

## 2024-06-07 ENCOUNTER — Encounter: Payer: Self-pay | Admitting: Acute Care

## 2024-06-08 ENCOUNTER — Other Ambulatory Visit: Payer: Self-pay | Admitting: Emergency Medicine

## 2024-06-13 ENCOUNTER — Other Ambulatory Visit: Payer: Self-pay | Admitting: Neurology

## 2024-06-14 ENCOUNTER — Telehealth: Payer: Self-pay | Admitting: Neurology

## 2024-06-14 NOTE — Telephone Encounter (Signed)
 Called patient and unable to leave a message as there is no answering machine. Called cell phone and was informed it was the wrong number,

## 2024-06-14 NOTE — Telephone Encounter (Signed)
 Pt lefted a message stating she needs a refill on her prescriptions called : gabapentin  (NEURONTIN ) 300 MG capsule .Pt stated that she needs this prescription by this Thursday. Thanks

## 2024-06-14 NOTE — Telephone Encounter (Signed)
 Pt calling back, was on the phone when Mahina cld Pls use [phone]

## 2024-06-14 NOTE — Telephone Encounter (Signed)
 Pending approval. Rx request has been sent to Dr. Tobie for approval.

## 2024-06-15 NOTE — Telephone Encounter (Signed)
 Called patient and left a detailed message per DPR that rx was sent to her pharmacy. Left contact information incase patient had any questions or concerns.

## 2024-06-28 ENCOUNTER — Telehealth: Payer: Self-pay

## 2024-06-28 NOTE — Telephone Encounter (Addendum)
 2026 Renewal PAP: Patient assistance application for Stioloto through [employer]) has been mailed to pt's home address on file. Provider portion of application will be faxed to provider's office.   PAP: Patient assistance application for Tresiba through [employer] has been mailed to [employer] home address on file. Provider portion of application will be faxed to provider's office.  Provider forms have been faxed to Wdr. Elsie Gentry and Dr. Lamar Chris

## 2024-07-01 NOTE — Telephone Encounter (Addendum)
 Received patient form for Stiolto and Tresiba

## 2024-07-11 ENCOUNTER — Other Ambulatory Visit: Payer: Self-pay | Admitting: Neurology

## 2024-07-13 ENCOUNTER — Telehealth: Payer: Self-pay | Admitting: Neurology

## 2024-07-13 NOTE — Telephone Encounter (Signed)
 Pt stated that she called to  [facility] last weekend to get her prescription filled called: nortriptyline  (PAMELOR ) 50 MG capsule , and she was told that the Doctor needs to send the prescription over so they can get the refill process. Thanks

## 2024-07-13 NOTE — Telephone Encounter (Signed)
 Refilled

## 2024-07-15 ENCOUNTER — Ambulatory Visit
Admission: RE | Admit: 2024-07-15 | Discharge: 2024-07-15 | Disposition: A | Discharge status: Home / Self Care | Source: Ambulatory Visit | Attending: Acute Care | Admitting: Acute Care

## 2024-07-15 DIAGNOSIS — Z122 Encounter for screening for malignant neoplasm of respiratory organs: Secondary | ICD-10-CM

## 2024-07-15 DIAGNOSIS — F17210 Nicotine dependence, cigarettes, uncomplicated: Secondary | ICD-10-CM

## 2024-07-15 DIAGNOSIS — Z87891 Personal history of nicotine dependence: Secondary | ICD-10-CM

## 2024-07-20 NOTE — Telephone Encounter (Signed)
 PAP: Application for Stioloto has been submitted to [employer]), via fax   PAP: Application for [place] has been submitted to [employer], via fax

## 2024-07-22 ENCOUNTER — Other Ambulatory Visit: Payer: Self-pay

## 2024-07-22 DIAGNOSIS — Z87891 Personal history of nicotine dependence: Secondary | ICD-10-CM

## 2024-07-22 DIAGNOSIS — Z122 Encounter for screening for malignant neoplasm of respiratory organs: Secondary | ICD-10-CM

## 2024-07-22 DIAGNOSIS — F17210 Nicotine dependence, cigarettes, uncomplicated: Secondary | ICD-10-CM

## 2024-07-25 ENCOUNTER — Ambulatory Visit: Payer: Medicare HMO | Admitting: Neurology

## 2024-07-25 ENCOUNTER — Encounter: Payer: Self-pay | Admitting: Neurology

## 2024-07-25 VITALS — BP 118/71 | HR 80 | Ht 67.0 in | Wt 187.0 lb

## 2024-07-25 DIAGNOSIS — E1142 Type 2 diabetes mellitus with diabetic polyneuropathy: Secondary | ICD-10-CM

## 2024-07-25 MED ORDER — GABAPENTIN 300 MG PO CAPS
ORAL_CAPSULE | ORAL | 3 refills | Status: AC
Start: 2024-07-25 — End: ?

## 2024-07-25 MED ORDER — NORTRIPTYLINE HCL 50 MG PO CAPS: 50.0000 mg | ORAL_CAPSULE | Freq: Every day | ORAL | 3 refills | Status: AC

## 2024-07-25 NOTE — Progress Notes (Signed)
 Follow-up Visit   Date: 07/25/24    Melanie [name] MRN: [id number] DOB: [birth year]   Interim History: Melanie [name] is a 78 y.o. 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   IMPRESSION/PLAN: Diabetic neuropathy affecting the feet and lower legs with numbness, painful paresthesias, and sensory ataxia.    - Continue nortriptyline  50mg  at bedtime  - Continue gabapentin  900mg  three times daily  - She is also taking Cymbalta 60mg  for depression, prescribed by PCP  - Patient educated on daily foot inspection, fall prevention, and safety precautions around the home.  Return to clinic in 1 year  ------------------------------------------------------------ UPDATE 06/27/2022:  She is here for follow-up visit.  Her neuropathy seems a little worse.  She continues to have numbness from the knees down and has sharp episodic pain in the legs.  Pain is better on gabapentin  900mg  TID and nortriptyline  40mg  at bedtime. She endorses dry mouth. She ran out of her medications for a day and pain was significantly worse.  No falls or weakness. She lives in a one-level home with a walk-in shower. She was hospitalized in February for pneumonia.   She also taking Cymbalta 60mg  daily for depression, prescribed by her PCP.   UPDATE 07/22/2023:  She is here for 1 year follow-up visit.  She has noticed that her balance is getting worse.  Fortunately, no falls.  She is not using a cane. She has low back pain with bending and stooping, walking long distances.  She occasionally has cramps in the calf.  She continues to have numbness in the feet and shooting pain in the legs.    UPDATE 07/25/2024:  She is here for 1 year visit.  She was hospitalized with NSTEMI in December 2024.  She  is doing well.  Neuropathic pain is controlled on gabapentin  and nortriptyline .  Numbness is unchanged and remains in the lower legs and feet.  No falls.  She uses a cane on uneven ground.  No new complaints.    Medications:  Current Outpatient Medications on File Prior to Visit  Medication Sig Dispense Refill   albuterol  (VENTOLIN  HFA) 108 (90 Base) MCG/ACT inhaler 1 puff as needed 8 g 1   amLODipine  (NORVASC ) 10 MG tablet Take 10 mg by mouth daily.     aspirin  EC 81 MG tablet Take 81 mg by mouth daily.      bisoprolol  (ZEBETA ) 5 MG tablet TAKE ONE TABLET BY MOUTH ONCE DAILY 90 tablet 3   chlorhexidine  (PERIDEX ) 0.12 % solution      clopidogrel  (PLAVIX ) 75 MG tablet Take 4 tablets (300mg ) by mouth on day 1 after stopping Brilinta , then decrease to 1 tablet daily thereafter (Patient taking differently: Take 75 mg by mouth once. Take 4 tablets (300mg ) by mouth on day 1 after stopping Brilinta , then decrease to 1 tablet daily thereafter) 90 tablet 3   Cyanocobalamin  (VITAMIN B-12 IJ) Inject 1,000 Units as directed every 30 (thirty) days.     DULoxetine (CYMBALTA) 60 MG capsule Take 60 mg by mouth daily.     Evolocumab  (REPATHA  SURECLICK) 140 MG/ML SOAJ Inject 140 mg into the skin every 14 (fourteen) days. 6 mL 3   gabapentin  (NEURONTIN ) 300 MG capsule TAKE 3 CAPSULES BY MOUTH EVERY MORNING, 3 CAPSULES AT NOON, AND 3 CAPSULES EVERY NIGHT AT BEDTIME (Patient taking differently:  Take 300 mg by mouth 3 (three) times daily. TAKE 3 CAPSULES BY MOUTH EVERY MORNING, 3 CAPSULES AT NOON, AND 3 CAPSULES EVERY NIGHT AT BEDTIME) 810 capsule 1   insulin  degludec (TRESIBA) 100 UNIT/ML FlexTouch Pen Inject 72 Units into the skin in the morning. (Patient taking differently: Inject 80 Units into the skin in the morning.)     lisinopril  (ZESTRIL ) 40 MG tablet Take 20 mg by mouth daily. (Patient taking differently: Take 40 mg by mouth daily. Take takes a whole tablet every morning)     metFORMIN  (GLUCOPHAGE -XR) 500 MG  24 hr tablet Take 500 mg by mouth every morning. (Patient taking differently: Take 1,000 mg by mouth every morning.)     nitroGLYCERIN  (NITROSTAT ) 0.4 MG SL tablet Place 1 tablet (0.4 mg total) under the tongue every 5 (five) minutes as needed. 25 tablet 3   nortriptyline  (PAMELOR ) 50 MG capsule TAKE 1 CAPSULE(50 MG) BY MOUTH AT BEDTIME 30 capsule 0   pantoprazole  (PROTONIX ) 40 MG tablet Take 1 tablet (40 mg total) by mouth daily. 90 tablet 3   Semaglutide,0.25 or 0.5MG /DOS, (OZEMPIC, 0.25 OR 0.5 MG/DOSE,) 2 MG/3ML SOPN Inject 0.5 mg into the skin once a week.     STIOLTO RESPIMAT  2.5-2.5 MCG/ACT AERS INHALE 2 PUFFS BY MOUTH DAILY 12 each 1   icosapent  Ethyl (VASCEPA ) 1 g capsule Take 2 g by mouth 2 (two) times daily. (Patient not taking: Reported on 07/25/2024)     No current facility-administered medications on file prior to visit.    Allergies:  Allergies  Allergen Reactions   Fenofibrate Other (See Comments)    muscle spasms   Breztri  Aerosphere [Budeson-Glycopyrrol-Formoterol] Other (See Comments)    Face turned red all over.    Metoprolol  Other (See Comments)    Headache   Rosuvastatin  Other (See Comments)    Muscle aches    Vital Signs:  BP 118/71   Pulse 80   Ht 5' 7 (1.702 m)   Wt 187 lb (84.8 kg)   SpO2 96%   BMI 29.29 kg/m   Neurological Exam: MENTAL STATUS including orientation to time, place, person, recent and remote memory, attention span and concentration, language, and fund of knowledge is normal.    CRANIAL NERVES: Pupils are around and reactive to light.  Extraocular muscles intact.  Face is symmetric   MOTOR:  Motor strength is 5/5 in all extremities, including distally in the feet.    MSRs:  Reflexes are 2+/4 in the upper extremities and absent in the lower extremities.   SENSORY:  Vibration is trace at the knees, absent at the ankles.  Rhomberg testing is positive.  COORDINATION/GAIT:  Gait is stable and unassisted, mildly wide-based,  stable  Data: MRI lumbar spine 04/03/2017: 1. Previously seen left subarticular disc protrusion L3-L4 has involuted or has been resected. There remains severe left L3-L4 neural foraminal stenosis due to foraminal disc protrusion superimposed on a diffuse disc bulge. 2. Unchanged moderate right L4-L5 neural foraminal stenosis secondary to endplate spurring. 3. Posterior decompression at L2-L4 with wide patency of the thecal sac.     Thank you for allowing me to participate in patient's care.  If I can answer any additional questions, I would be pleased to do so.    Sincerely,    Luke Falero K. Tobie, DO

## 2024-07-25 NOTE — Patient Instructions (Addendum)
 You can try calling [phone] to schedule visit with Dr. Carlin Hoe at [facility]

## 2024-08-17 NOTE — Telephone Encounter (Signed)
 Resubmitted [employer] application-would not accept until 11/27, pt. Was approved through 09/10/2024

## 2024-08-19 ENCOUNTER — Telehealth: Payer: Self-pay | Admitting: Pharmacy Technician

## 2024-08-19 NOTE — Telephone Encounter (Signed)
   Pharmacy Patient Advocate Encounter   Received notification from CoverMyMeds that prior authorization for repatha  is required/requested.   Insurance verification completed.   The patient is insured through [place].   Per test claim: PA required; PA submitted to above mentioned insurance via Latent Key/confirmation #/EOC [id number] Status is pending

## 2024-08-19 NOTE — Telephone Encounter (Signed)
 PAP: Patient assistance application for [place] has been approved by PAP Companies: [employer] from 1/1/20026 to 09/14/2025. Medication should be delivered to PAP Delivery: Provider's office. For further shipping updates, please contact [employer] at [phone]. Patient ID is: [id number]   PAP: Patient assistance application for Stioloto has been approved by PAP Companies: [employer] from 09/15/2024 to 09/14/2025. Medication should be delivered to PAP Delivery: Home. For further shipping updates, please contact [employer] at [phone]. Patient ID is: PER AUTOMATED SYSTEM

## 2024-08-19 NOTE — Telephone Encounter (Signed)
 Pharmacy Patient Advocate Encounter  Received notification from [facility] that Prior Authorization for repatha  has been APPROVED from 08/19/24 to 09/14/25   PA #/Case ID/Reference #: [id number]

## 2024-09-07 ENCOUNTER — Encounter (INDEPENDENT_AMBULATORY_CARE_PROVIDER_SITE_OTHER): Payer: Self-pay

## 2024-09-12 ENCOUNTER — Other Ambulatory Visit: Payer: Self-pay | Admitting: Cardiology

## 2024-09-12 ENCOUNTER — Other Ambulatory Visit: Payer: Self-pay | Admitting: Internal Medicine

## 2024-09-12 DIAGNOSIS — Z1231 Encounter for screening mammogram for malignant neoplasm of breast: Secondary | ICD-10-CM

## 2024-09-13 ENCOUNTER — Other Ambulatory Visit: Payer: Self-pay | Admitting: Pharmacist

## 2024-09-13 DIAGNOSIS — E785 Hyperlipidemia, unspecified: Secondary | ICD-10-CM

## 2024-09-13 DIAGNOSIS — I2510 Atherosclerotic heart disease of native coronary artery without angina pectoris: Secondary | ICD-10-CM

## 2024-09-13 MED ORDER — REPATHA SURECLICK 140 MG/ML ~~LOC~~ SOAJ: 140.0000 mg | SUBCUTANEOUS | 3 refills | Status: AC

## 2024-09-17 ENCOUNTER — Other Ambulatory Visit: Payer: Self-pay | Admitting: Cardiology

## 2024-09-30 ENCOUNTER — Other Ambulatory Visit: Payer: Self-pay

## 2024-09-30 MED ORDER — BISOPROLOL FUMARATE 5 MG PO TABS: ORAL_TABLET | ORAL | 0 refills | Status: AC

## 2024-09-30 NOTE — Telephone Encounter (Signed)
 In accordance with refill protocols, please review and address the following requirements before this medication refill can be authorized:  Labs

## 2024-10-02 ENCOUNTER — Other Ambulatory Visit: Payer: Self-pay | Admitting: Cardiology

## 2024-10-07 ENCOUNTER — Institutional Professional Consult (permissible substitution) (INDEPENDENT_AMBULATORY_CARE_PROVIDER_SITE_OTHER)

## 2024-10-11 ENCOUNTER — Ambulatory Visit

## 2024-10-21 ENCOUNTER — Ambulatory Visit: Admission: RE | Admit: 2024-10-21 | Payer: Medicare (Managed Care) | Source: Ambulatory Visit

## 2024-10-21 DIAGNOSIS — Z1231 Encounter for screening mammogram for malignant neoplasm of breast: Secondary | ICD-10-CM

## 2024-11-04 ENCOUNTER — Institutional Professional Consult (permissible substitution) (INDEPENDENT_AMBULATORY_CARE_PROVIDER_SITE_OTHER)

## 2025-07-31 ENCOUNTER — Ambulatory Visit: Admitting: Neurology
